# Patient Record
Sex: Female | Born: 1940 | Race: White | Hispanic: No | Smoking: Never smoker
Health system: Southern US, Community
[De-identification: ages and names within clinical notes are randomized; demographics above are authoritative.]

## PROBLEM LIST (undated history)

## (undated) DIAGNOSIS — E876 Hypokalemia: Secondary | ICD-10-CM

## (undated) DIAGNOSIS — I5022 Chronic systolic (congestive) heart failure: Secondary | ICD-10-CM

## (undated) DIAGNOSIS — I447 Left bundle-branch block, unspecified: Secondary | ICD-10-CM

## (undated) DIAGNOSIS — K1230 Oral mucositis (ulcerative), unspecified: Secondary | ICD-10-CM

## (undated) DIAGNOSIS — Z95810 Presence of automatic (implantable) cardiac defibrillator: Secondary | ICD-10-CM

## (undated) DIAGNOSIS — G259 Extrapyramidal and movement disorder, unspecified: Secondary | ICD-10-CM

## (undated) DIAGNOSIS — R5081 Fever presenting with conditions classified elsewhere: Secondary | ICD-10-CM

## (undated) DIAGNOSIS — G4733 Obstructive sleep apnea (adult) (pediatric): Secondary | ICD-10-CM

## (undated) DIAGNOSIS — F0390 Unspecified dementia without behavioral disturbance: Secondary | ICD-10-CM

## (undated) DIAGNOSIS — I10 Essential (primary) hypertension: Secondary | ICD-10-CM

## (undated) DIAGNOSIS — G4730 Sleep apnea, unspecified: Secondary | ICD-10-CM

## (undated) DIAGNOSIS — I722 Aneurysm of renal artery: Secondary | ICD-10-CM

## (undated) DIAGNOSIS — I428 Other cardiomyopathies: Secondary | ICD-10-CM

## (undated) DIAGNOSIS — C189 Malignant neoplasm of colon, unspecified: Secondary | ICD-10-CM

## (undated) DIAGNOSIS — D709 Neutropenia, unspecified: Secondary | ICD-10-CM

## (undated) DIAGNOSIS — Z9889 Other specified postprocedural states: Secondary | ICD-10-CM

## (undated) DIAGNOSIS — K529 Noninfective gastroenteritis and colitis, unspecified: Secondary | ICD-10-CM

## (undated) DIAGNOSIS — D649 Anemia, unspecified: Secondary | ICD-10-CM

## (undated) HISTORY — DX: Aneurysm of renal artery: I72.2

## (undated) HISTORY — DX: Unspecified dementia, unspecified severity, without behavioral disturbance, psychotic disturbance, mood disturbance, and anxiety: F03.90

## (undated) HISTORY — DX: Anemia, unspecified: D64.9

## (undated) HISTORY — DX: Oral mucositis (ulcerative), unspecified: K12.30

## (undated) HISTORY — DX: Hypokalemia: E87.6

## (undated) HISTORY — PX: CHOLECYSTECTOMY: SHX55

## (undated) HISTORY — DX: Left bundle-branch block, unspecified: I44.7

## (undated) HISTORY — DX: Fever presenting with conditions classified elsewhere: R50.81

## (undated) HISTORY — DX: Extrapyramidal and movement disorder, unspecified: G25.9

## (undated) HISTORY — DX: Malignant neoplasm of colon, unspecified: C18.9

## (undated) HISTORY — PX: APPENDECTOMY: SHX54

## (undated) HISTORY — DX: Other cardiomyopathies: I42.8

## (undated) HISTORY — DX: Sleep apnea, unspecified: G47.30

## (undated) HISTORY — PX: OTHER SURGICAL HISTORY: SHX169

## (undated) HISTORY — DX: Neutropenia, unspecified: D70.9

## (undated) HISTORY — DX: Noninfective gastroenteritis and colitis, unspecified: K52.9

## (undated) HISTORY — PX: HEMORROIDECTOMY: SUR656

## (undated) HISTORY — PX: TUBAL LIGATION: SHX77

## (undated) HISTORY — PX: TONSILLECTOMY: SUR1361

## (undated) HISTORY — DX: Other specified postprocedural states: Z98.890

## (undated) HISTORY — PX: CARDIAC CATHETERIZATION: SHX172

## (undated) HISTORY — PX: PORT-A-CATH REMOVAL: SHX5289

---

## 1997-08-26 ENCOUNTER — Other Ambulatory Visit: Admission: RE | Admit: 1997-08-26 | Discharge: 1997-08-26 | Payer: Self-pay | Admitting: Gynecology

## 1998-01-05 ENCOUNTER — Emergency Department (HOSPITAL_COMMUNITY): Admission: EM | Admit: 1998-01-05 | Discharge: 1998-01-05 | Payer: Self-pay | Admitting: Emergency Medicine

## 1998-02-14 ENCOUNTER — Other Ambulatory Visit: Admission: RE | Admit: 1998-02-14 | Discharge: 1998-02-14 | Payer: Self-pay | Admitting: Gynecology

## 1998-06-18 DIAGNOSIS — Z9889 Other specified postprocedural states: Secondary | ICD-10-CM

## 1998-06-18 HISTORY — DX: Other specified postprocedural states: Z98.890

## 1999-02-17 ENCOUNTER — Ambulatory Visit (HOSPITAL_COMMUNITY): Admission: RE | Admit: 1999-02-17 | Discharge: 1999-02-17 | Payer: Self-pay | Admitting: Internal Medicine

## 1999-04-17 ENCOUNTER — Other Ambulatory Visit: Admission: RE | Admit: 1999-04-17 | Discharge: 1999-04-17 | Payer: Self-pay | Admitting: Gynecology

## 1999-04-21 ENCOUNTER — Ambulatory Visit (HOSPITAL_COMMUNITY): Admission: RE | Admit: 1999-04-21 | Discharge: 1999-04-21 | Payer: Self-pay | Admitting: Cardiovascular Disease

## 1999-10-03 ENCOUNTER — Ambulatory Visit (HOSPITAL_COMMUNITY): Admission: RE | Admit: 1999-10-03 | Discharge: 1999-10-03 | Payer: Self-pay | Admitting: Gastroenterology

## 1999-10-20 ENCOUNTER — Encounter (INDEPENDENT_AMBULATORY_CARE_PROVIDER_SITE_OTHER): Payer: Self-pay | Admitting: Specialist

## 1999-10-20 ENCOUNTER — Other Ambulatory Visit: Admission: RE | Admit: 1999-10-20 | Discharge: 1999-10-20 | Payer: Self-pay | Admitting: General Surgery

## 2000-04-17 ENCOUNTER — Other Ambulatory Visit: Admission: RE | Admit: 2000-04-17 | Discharge: 2000-04-17 | Payer: Self-pay | Admitting: Gynecology

## 2000-05-06 ENCOUNTER — Encounter (INDEPENDENT_AMBULATORY_CARE_PROVIDER_SITE_OTHER): Payer: Self-pay | Admitting: Specialist

## 2000-05-06 ENCOUNTER — Other Ambulatory Visit: Admission: RE | Admit: 2000-05-06 | Discharge: 2000-05-06 | Payer: Self-pay | Admitting: Gynecology

## 2000-12-22 ENCOUNTER — Encounter: Payer: Self-pay | Admitting: Internal Medicine

## 2000-12-22 ENCOUNTER — Emergency Department (HOSPITAL_COMMUNITY): Admission: EM | Admit: 2000-12-22 | Discharge: 2000-12-22 | Payer: Self-pay | Admitting: Emergency Medicine

## 2001-01-01 ENCOUNTER — Ambulatory Visit (HOSPITAL_COMMUNITY): Admission: RE | Admit: 2001-01-01 | Discharge: 2001-01-01 | Payer: Self-pay | Admitting: Gastroenterology

## 2001-01-01 ENCOUNTER — Encounter: Payer: Self-pay | Admitting: Gastroenterology

## 2001-02-04 ENCOUNTER — Encounter: Payer: Self-pay | Admitting: General Surgery

## 2001-02-04 ENCOUNTER — Encounter (INDEPENDENT_AMBULATORY_CARE_PROVIDER_SITE_OTHER): Payer: Self-pay

## 2001-02-04 ENCOUNTER — Observation Stay (HOSPITAL_COMMUNITY): Admission: RE | Admit: 2001-02-04 | Discharge: 2001-02-05 | Payer: Self-pay | Admitting: General Surgery

## 2001-04-18 ENCOUNTER — Other Ambulatory Visit: Admission: RE | Admit: 2001-04-18 | Discharge: 2001-04-18 | Payer: Self-pay | Admitting: Gynecology

## 2001-10-17 ENCOUNTER — Encounter: Payer: Self-pay | Admitting: Emergency Medicine

## 2001-10-17 ENCOUNTER — Emergency Department (HOSPITAL_COMMUNITY): Admission: EM | Admit: 2001-10-17 | Discharge: 2001-10-17 | Payer: Self-pay | Admitting: Emergency Medicine

## 2002-05-12 ENCOUNTER — Other Ambulatory Visit: Admission: RE | Admit: 2002-05-12 | Discharge: 2002-05-12 | Payer: Self-pay | Admitting: Gynecology

## 2003-08-23 ENCOUNTER — Other Ambulatory Visit: Admission: RE | Admit: 2003-08-23 | Discharge: 2003-08-23 | Payer: Self-pay | Admitting: Gynecology

## 2004-07-21 ENCOUNTER — Ambulatory Visit (HOSPITAL_BASED_OUTPATIENT_CLINIC_OR_DEPARTMENT_OTHER): Admission: RE | Admit: 2004-07-21 | Discharge: 2004-07-21 | Payer: Self-pay | Admitting: Orthopedic Surgery

## 2004-08-23 ENCOUNTER — Other Ambulatory Visit: Admission: RE | Admit: 2004-08-23 | Discharge: 2004-08-23 | Payer: Self-pay | Admitting: Gynecology

## 2005-08-10 ENCOUNTER — Encounter: Admission: RE | Admit: 2005-08-10 | Discharge: 2005-08-10 | Payer: Self-pay | Admitting: Gastroenterology

## 2005-08-16 DIAGNOSIS — C189 Malignant neoplasm of colon, unspecified: Secondary | ICD-10-CM

## 2005-08-16 HISTORY — DX: Malignant neoplasm of colon, unspecified: C18.9

## 2005-08-28 ENCOUNTER — Other Ambulatory Visit: Admission: RE | Admit: 2005-08-28 | Discharge: 2005-08-28 | Payer: Self-pay | Admitting: Gynecology

## 2005-08-31 ENCOUNTER — Encounter (INDEPENDENT_AMBULATORY_CARE_PROVIDER_SITE_OTHER): Payer: Self-pay | Admitting: *Deleted

## 2005-08-31 ENCOUNTER — Ambulatory Visit (HOSPITAL_COMMUNITY): Admission: RE | Admit: 2005-08-31 | Discharge: 2005-08-31 | Payer: Self-pay | Admitting: General Surgery

## 2005-09-12 ENCOUNTER — Ambulatory Visit: Payer: Self-pay | Admitting: Hematology and Oncology

## 2005-09-19 LAB — CBC WITH DIFFERENTIAL/PLATELET
BASO%: 0.7 % (ref 0.0–2.0)
Basophils Absolute: 0 10*3/uL (ref 0.0–0.1)
EOS%: 3.5 % (ref 0.0–7.0)
Eosinophils Absolute: 0.2 10*3/uL (ref 0.0–0.5)
HCT: 36.3 % (ref 34.8–46.6)
HGB: 12.2 g/dL (ref 11.6–15.9)
LYMPH%: 51 % — ABNORMAL HIGH (ref 14.0–48.0)
MCH: 30.9 pg (ref 26.0–34.0)
MCHC: 33.6 g/dL (ref 32.0–36.0)
MCV: 91.8 fL (ref 81.0–101.0)
MONO#: 0.6 10*3/uL (ref 0.1–0.9)
MONO%: 9.8 % (ref 0.0–13.0)
NEUT#: 2.1 10*3/uL (ref 1.5–6.5)
NEUT%: 35 % — ABNORMAL LOW (ref 39.6–76.8)
Platelets: 320 10*3/uL (ref 145–400)
RBC: 3.96 10*6/uL (ref 3.70–5.32)
RDW: 12.9 % (ref 11.3–14.5)
WBC: 5.9 10*3/uL (ref 3.9–10.0)
lymph#: 3 10*3/uL (ref 0.9–3.3)

## 2005-09-19 LAB — COMPREHENSIVE METABOLIC PANEL
ALT: 21 U/L (ref 0–40)
AST: 25 U/L (ref 0–37)
Albumin: 4.4 g/dL (ref 3.5–5.2)
Alkaline Phosphatase: 85 U/L (ref 39–117)
BUN: 18 mg/dL (ref 6–23)
CO2: 28 mEq/L (ref 19–32)
Calcium: 9.3 mg/dL (ref 8.4–10.5)
Chloride: 102 mEq/L (ref 96–112)
Creatinine, Ser: 0.8 mg/dL (ref 0.4–1.2)
Glucose, Bld: 127 mg/dL — ABNORMAL HIGH (ref 70–99)
Potassium: 3.8 mEq/L (ref 3.5–5.3)
Sodium: 143 mEq/L (ref 135–145)
Total Bilirubin: 0.3 mg/dL (ref 0.3–1.2)
Total Protein: 7 g/dL (ref 6.0–8.3)

## 2005-09-19 LAB — CEA: CEA: 1 ng/mL (ref 0.0–5.0)

## 2005-09-20 ENCOUNTER — Ambulatory Visit: Admission: RE | Admit: 2005-09-20 | Discharge: 2005-12-12 | Payer: Self-pay | Admitting: Radiation Oncology

## 2005-09-26 ENCOUNTER — Ambulatory Visit (HOSPITAL_COMMUNITY): Admission: RE | Admit: 2005-09-26 | Discharge: 2005-09-26 | Payer: Self-pay | Admitting: Hematology and Oncology

## 2005-09-28 ENCOUNTER — Ambulatory Visit (HOSPITAL_BASED_OUTPATIENT_CLINIC_OR_DEPARTMENT_OTHER): Admission: RE | Admit: 2005-09-28 | Discharge: 2005-09-28 | Payer: Self-pay | Admitting: General Surgery

## 2005-10-09 LAB — BASIC METABOLIC PANEL
BUN: 14 mg/dL (ref 6–23)
CO2: 30 mEq/L (ref 19–32)
Calcium: 9.5 mg/dL (ref 8.4–10.5)
Chloride: 102 mEq/L (ref 96–112)
Creatinine, Ser: 0.8 mg/dL (ref 0.4–1.2)
Glucose, Bld: 115 mg/dL — ABNORMAL HIGH (ref 70–99)
Potassium: 3.8 mEq/L (ref 3.5–5.3)
Sodium: 140 mEq/L (ref 135–145)

## 2005-10-09 LAB — CBC WITH DIFFERENTIAL/PLATELET
BASO%: 0.2 % (ref 0.0–2.0)
Basophils Absolute: 0 10*3/uL (ref 0.0–0.1)
EOS%: 0.3 % (ref 0.0–7.0)
Eosinophils Absolute: 0 10*3/uL (ref 0.0–0.5)
HCT: 37 % (ref 34.8–46.6)
HGB: 12.4 g/dL (ref 11.6–15.9)
LYMPH%: 16.5 % (ref 14.0–48.0)
MCH: 31.1 pg (ref 26.0–34.0)
MCHC: 33.5 g/dL (ref 32.0–36.0)
MCV: 93 fL (ref 81.0–101.0)
MONO#: 0.9 10*3/uL (ref 0.1–0.9)
MONO%: 7.7 % (ref 0.0–13.0)
NEUT#: 9.2 10*3/uL — ABNORMAL HIGH (ref 1.5–6.5)
NEUT%: 75.3 % (ref 39.6–76.8)
Platelets: 319 10*3/uL (ref 145–400)
RBC: 3.98 10*6/uL (ref 3.70–5.32)
RDW: 12.9 % (ref 11.3–14.5)
WBC: 12.2 10*3/uL — ABNORMAL HIGH (ref 3.9–10.0)
lymph#: 2 10*3/uL (ref 0.9–3.3)

## 2005-10-16 LAB — CBC WITH DIFFERENTIAL/PLATELET
BASO%: 0.3 % (ref 0.0–2.0)
Basophils Absolute: 0 10*3/uL (ref 0.0–0.1)
EOS%: 3.6 % (ref 0.0–7.0)
Eosinophils Absolute: 0.1 10*3/uL (ref 0.0–0.5)
HCT: 32.7 % — ABNORMAL LOW (ref 34.8–46.6)
HGB: 11.3 g/dL — ABNORMAL LOW (ref 11.6–15.9)
LYMPH%: 33.7 % (ref 14.0–48.0)
MCH: 31.3 pg (ref 26.0–34.0)
MCHC: 34.5 g/dL (ref 32.0–36.0)
MCV: 90.7 fL (ref 81.0–101.0)
MONO#: 0.1 10*3/uL (ref 0.1–0.9)
MONO%: 2.5 % (ref 0.0–13.0)
NEUT#: 1.6 10*3/uL (ref 1.5–6.5)
NEUT%: 59.9 % (ref 39.6–76.8)
Platelets: 190 10*3/uL (ref 145–400)
RBC: 3.6 10*6/uL — ABNORMAL LOW (ref 3.70–5.32)
RDW: 12.6 % (ref 11.3–14.5)
WBC: 2.7 10*3/uL — ABNORMAL LOW (ref 3.9–10.0)
lymph#: 0.9 10*3/uL (ref 0.9–3.3)

## 2005-10-22 LAB — CBC WITH DIFFERENTIAL/PLATELET
BASO%: 0.4 % (ref 0.0–2.0)
Basophils Absolute: 0 10*3/uL (ref 0.0–0.1)
EOS%: 12 % — ABNORMAL HIGH (ref 0.0–7.0)
Eosinophils Absolute: 0.2 10*3/uL (ref 0.0–0.5)
HCT: 29.9 % — ABNORMAL LOW (ref 34.8–46.6)
HGB: 10.3 g/dL — ABNORMAL LOW (ref 11.6–15.9)
LYMPH%: 57.5 % — ABNORMAL HIGH (ref 14.0–48.0)
MCH: 31.2 pg (ref 26.0–34.0)
MCHC: 34.5 g/dL (ref 32.0–36.0)
MCV: 90.3 fL (ref 81.0–101.0)
MONO#: 0.2 10*3/uL (ref 0.1–0.9)
MONO%: 13.9 % — ABNORMAL HIGH (ref 0.0–13.0)
NEUT#: 0.2 10*3/uL — CL (ref 1.5–6.5)
NEUT%: 16.2 % — ABNORMAL LOW (ref 39.6–76.8)
Platelets: 44 10*3/uL — ABNORMAL LOW (ref 145–400)
RBC: 3.31 10*6/uL — ABNORMAL LOW (ref 3.70–5.32)
RDW: 11.9 % (ref 11.3–14.5)
WBC: 1.4 10*3/uL — ABNORMAL LOW (ref 3.9–10.0)
lymph#: 0.8 10*3/uL — ABNORMAL LOW (ref 0.9–3.3)

## 2005-10-26 LAB — CBC WITH DIFFERENTIAL/PLATELET
BASO%: 2.2 % — ABNORMAL HIGH (ref 0.0–2.0)
Basophils Absolute: 0.1 10*3/uL (ref 0.0–0.1)
EOS%: 2.1 % (ref 0.0–7.0)
Eosinophils Absolute: 0 10*3/uL (ref 0.0–0.5)
HCT: 29.3 % — ABNORMAL LOW (ref 34.8–46.6)
HGB: 10.4 g/dL — ABNORMAL LOW (ref 11.6–15.9)
LYMPH%: 30.4 % (ref 14.0–48.0)
MCH: 31.4 pg (ref 26.0–34.0)
MCHC: 35.5 g/dL (ref 32.0–36.0)
MCV: 88.5 fL (ref 81.0–101.0)
MONO#: 0.8 10*3/uL (ref 0.1–0.9)
MONO%: 33.7 % — ABNORMAL HIGH (ref 0.0–13.0)
NEUT#: 0.7 10*3/uL — ABNORMAL LOW (ref 1.5–6.5)
NEUT%: 31.5 % — ABNORMAL LOW (ref 39.6–76.8)
Platelets: 66 10*3/uL — ABNORMAL LOW (ref 145–400)
RBC: 3.31 10*6/uL — ABNORMAL LOW (ref 3.70–5.32)
RDW: 10.8 % — ABNORMAL LOW (ref 11.3–14.5)
WBC: 2.3 10*3/uL — ABNORMAL LOW (ref 3.9–10.0)
lymph#: 0.7 10*3/uL — ABNORMAL LOW (ref 0.9–3.3)

## 2005-10-30 LAB — CBC WITH DIFFERENTIAL/PLATELET
BASO%: 0.4 % (ref 0.0–2.0)
Basophils Absolute: 0 10*3/uL (ref 0.0–0.1)
EOS%: 4.7 % (ref 0.0–7.0)
Eosinophils Absolute: 0.2 10*3/uL (ref 0.0–0.5)
HCT: 30.6 % — ABNORMAL LOW (ref 34.8–46.6)
HGB: 10.5 g/dL — ABNORMAL LOW (ref 11.6–15.9)
LYMPH%: 26.1 % (ref 14.0–48.0)
MCH: 31.5 pg (ref 26.0–34.0)
MCHC: 34.3 g/dL (ref 32.0–36.0)
MCV: 91.8 fL (ref 81.0–101.0)
MONO#: 0.8 10*3/uL (ref 0.1–0.9)
MONO%: 21 % — ABNORMAL HIGH (ref 0.0–13.0)
NEUT#: 1.8 10*3/uL (ref 1.5–6.5)
NEUT%: 47.8 % (ref 39.6–76.8)
Platelets: 220 10*3/uL (ref 145–400)
RBC: 3.34 10*6/uL — ABNORMAL LOW (ref 3.70–5.32)
RDW: 12.8 % (ref 11.3–14.5)
WBC: 3.7 10*3/uL — ABNORMAL LOW (ref 3.9–10.0)
lymph#: 1 10*3/uL (ref 0.9–3.3)

## 2005-10-31 ENCOUNTER — Ambulatory Visit: Payer: Self-pay | Admitting: Hematology and Oncology

## 2005-11-02 LAB — COMPREHENSIVE METABOLIC PANEL
ALT: 24 U/L (ref 0–40)
AST: 28 U/L (ref 0–37)
Albumin: 3.8 g/dL (ref 3.5–5.2)
Alkaline Phosphatase: 65 U/L (ref 39–117)
BUN: 15 mg/dL (ref 6–23)
CO2: 28 mEq/L (ref 19–32)
Calcium: 8.5 mg/dL (ref 8.4–10.5)
Chloride: 104 mEq/L (ref 96–112)
Creatinine, Ser: 0.7 mg/dL (ref 0.4–1.2)
Glucose, Bld: 128 mg/dL — ABNORMAL HIGH (ref 70–99)
Potassium: 4.5 mEq/L (ref 3.5–5.3)
Sodium: 141 mEq/L (ref 135–145)
Total Bilirubin: 0.3 mg/dL (ref 0.3–1.2)
Total Protein: 6.2 g/dL (ref 6.0–8.3)

## 2005-11-02 LAB — CBC WITH DIFFERENTIAL/PLATELET
BASO%: 0.2 % (ref 0.0–2.0)
Basophils Absolute: 0 10*3/uL (ref 0.0–0.1)
EOS%: 9 % — ABNORMAL HIGH (ref 0.0–7.0)
Eosinophils Absolute: 0.3 10*3/uL (ref 0.0–0.5)
HCT: 29.4 % — ABNORMAL LOW (ref 34.8–46.6)
HGB: 10.1 g/dL — ABNORMAL LOW (ref 11.6–15.9)
LYMPH%: 17.2 % (ref 14.0–48.0)
MCH: 31.5 pg (ref 26.0–34.0)
MCHC: 34.5 g/dL (ref 32.0–36.0)
MCV: 91.5 fL (ref 81.0–101.0)
MONO#: 0.6 10*3/uL (ref 0.1–0.9)
MONO%: 16.2 % — ABNORMAL HIGH (ref 0.0–13.0)
NEUT#: 2.2 10*3/uL (ref 1.5–6.5)
NEUT%: 57.4 % (ref 39.6–76.8)
Platelets: 293 10*3/uL (ref 145–400)
RBC: 3.21 10*6/uL — ABNORMAL LOW (ref 3.70–5.32)
RDW: 12.9 % (ref 11.3–14.5)
WBC: 3.8 10*3/uL — ABNORMAL LOW (ref 3.9–10.0)
lymph#: 0.7 10*3/uL — ABNORMAL LOW (ref 0.9–3.3)

## 2005-11-06 LAB — CBC WITH DIFFERENTIAL/PLATELET
BASO%: 0.4 % (ref 0.0–2.0)
Basophils Absolute: 0 10*3/uL (ref 0.0–0.1)
EOS%: 11.9 % — ABNORMAL HIGH (ref 0.0–7.0)
Eosinophils Absolute: 0.7 10*3/uL — ABNORMAL HIGH (ref 0.0–0.5)
HCT: 31.5 % — ABNORMAL LOW (ref 34.8–46.6)
HGB: 10.7 g/dL — ABNORMAL LOW (ref 11.6–15.9)
LYMPH%: 16.9 % (ref 14.0–48.0)
MCH: 31.5 pg (ref 26.0–34.0)
MCHC: 33.9 g/dL (ref 32.0–36.0)
MCV: 92.9 fL (ref 81.0–101.0)
MONO#: 0.9 10*3/uL (ref 0.1–0.9)
MONO%: 15.4 % — ABNORMAL HIGH (ref 0.0–13.0)
NEUT#: 3.1 10*3/uL (ref 1.5–6.5)
NEUT%: 55.4 % (ref 39.6–76.8)
Platelets: 353 10*3/uL (ref 145–400)
RBC: 3.4 10*6/uL — ABNORMAL LOW (ref 3.70–5.32)
RDW: 14.1 % (ref 11.3–14.5)
WBC: 5.7 10*3/uL (ref 3.9–10.0)
lymph#: 1 10*3/uL (ref 0.9–3.3)

## 2005-11-13 LAB — CBC WITH DIFFERENTIAL/PLATELET
BASO%: 0.4 % (ref 0.0–2.0)
Basophils Absolute: 0 10*3/uL (ref 0.0–0.1)
EOS%: 9.2 % — ABNORMAL HIGH (ref 0.0–7.0)
Eosinophils Absolute: 0.4 10*3/uL (ref 0.0–0.5)
HCT: 30.8 % — ABNORMAL LOW (ref 34.8–46.6)
HGB: 10.6 g/dL — ABNORMAL LOW (ref 11.6–15.9)
LYMPH%: 13.4 % — ABNORMAL LOW (ref 14.0–48.0)
MCH: 31.8 pg (ref 26.0–34.0)
MCHC: 34.5 g/dL (ref 32.0–36.0)
MCV: 92.3 fL (ref 81.0–101.0)
MONO#: 0.8 10*3/uL (ref 0.1–0.9)
MONO%: 18.1 % — ABNORMAL HIGH (ref 0.0–13.0)
NEUT#: 2.7 10*3/uL (ref 1.5–6.5)
NEUT%: 58.9 % (ref 39.6–76.8)
Platelets: 201 10*3/uL (ref 145–400)
RBC: 3.34 10*6/uL — ABNORMAL LOW (ref 3.70–5.32)
RDW: 14.7 % — ABNORMAL HIGH (ref 11.3–14.5)
WBC: 4.6 10*3/uL (ref 3.9–10.0)
lymph#: 0.6 10*3/uL — ABNORMAL LOW (ref 0.9–3.3)

## 2005-11-21 LAB — CBC WITH DIFFERENTIAL/PLATELET
BASO%: 0.2 % (ref 0.0–2.0)
Basophils Absolute: 0 10*3/uL (ref 0.0–0.1)
EOS%: 6.2 % (ref 0.0–7.0)
Eosinophils Absolute: 0.2 10*3/uL (ref 0.0–0.5)
HCT: 29.2 % — ABNORMAL LOW (ref 34.8–46.6)
HGB: 9.8 g/dL — ABNORMAL LOW (ref 11.6–15.9)
LYMPH%: 6.3 % — ABNORMAL LOW (ref 14.0–48.0)
MCH: 31.1 pg (ref 26.0–34.0)
MCHC: 33.5 g/dL (ref 32.0–36.0)
MCV: 92.9 fL (ref 81.0–101.0)
MONO#: 0.1 10*3/uL (ref 0.1–0.9)
MONO%: 2 % (ref 0.0–13.0)
NEUT#: 2.7 10*3/uL (ref 1.5–6.5)
NEUT%: 85.3 % — ABNORMAL HIGH (ref 39.6–76.8)
Platelets: 136 10*3/uL — ABNORMAL LOW (ref 145–400)
RBC: 3.14 10*6/uL — ABNORMAL LOW (ref 3.70–5.32)
RDW: 15.2 % — ABNORMAL HIGH (ref 11.3–14.5)
WBC: 3.2 10*3/uL — ABNORMAL LOW (ref 3.9–10.0)
lymph#: 0.2 10*3/uL — ABNORMAL LOW (ref 0.9–3.3)

## 2005-11-21 LAB — COMPREHENSIVE METABOLIC PANEL
ALT: 29 U/L (ref 0–40)
AST: 21 U/L (ref 0–37)
Albumin: 3.7 g/dL (ref 3.5–5.2)
Alkaline Phosphatase: 77 U/L (ref 39–117)
BUN: 17 mg/dL (ref 6–23)
CO2: 24 mEq/L (ref 19–32)
Calcium: 8.5 mg/dL (ref 8.4–10.5)
Chloride: 106 mEq/L (ref 96–112)
Creatinine, Ser: 0.75 mg/dL (ref 0.40–1.20)
Glucose, Bld: 131 mg/dL — ABNORMAL HIGH (ref 70–99)
Potassium: 4.3 mEq/L (ref 3.5–5.3)
Sodium: 141 mEq/L (ref 135–145)
Total Bilirubin: 0.5 mg/dL (ref 0.3–1.2)
Total Protein: 6.4 g/dL (ref 6.0–8.3)

## 2005-11-22 ENCOUNTER — Ambulatory Visit: Payer: Self-pay | Admitting: Hematology and Oncology

## 2005-11-22 ENCOUNTER — Inpatient Hospital Stay (HOSPITAL_COMMUNITY): Admission: EM | Admit: 2005-11-22 | Discharge: 2005-11-23 | Payer: Self-pay | Admitting: Hematology and Oncology

## 2005-11-22 LAB — CBC WITH DIFFERENTIAL/PLATELET
BASO%: 0.1 % (ref 0.0–2.0)
Basophils Absolute: 0 10*3/uL (ref 0.0–0.1)
EOS%: 5.6 % (ref 0.0–7.0)
Eosinophils Absolute: 0.1 10*3/uL (ref 0.0–0.5)
HCT: 24.1 % — ABNORMAL LOW (ref 34.8–46.6)
HGB: 8.4 g/dL — ABNORMAL LOW (ref 11.6–15.9)
LYMPH%: 10.2 % — ABNORMAL LOW (ref 14.0–48.0)
MCH: 32.2 pg (ref 26.0–34.0)
MCHC: 34.7 g/dL (ref 32.0–36.0)
MCV: 92.6 fL (ref 81.0–101.0)
MONO#: 0.1 10*3/uL (ref 0.1–0.9)
MONO%: 5.3 % (ref 0.0–13.0)
NEUT#: 1.1 10*3/uL — ABNORMAL LOW (ref 1.5–6.5)
NEUT%: 78.8 % — ABNORMAL HIGH (ref 39.6–76.8)
Platelets: 99 10*3/uL — ABNORMAL LOW (ref 145–400)
RBC: 2.6 10*6/uL — ABNORMAL LOW (ref 3.70–5.32)
RDW: 14.7 % — ABNORMAL HIGH (ref 11.3–14.5)
WBC: 1.4 10*3/uL — ABNORMAL LOW (ref 3.9–10.0)
lymph#: 0.1 10*3/uL — ABNORMAL LOW (ref 0.9–3.3)

## 2005-11-22 LAB — BASIC METABOLIC PANEL
BUN: 7 mg/dL (ref 6–23)
CO2: 25 mEq/L (ref 19–32)
Calcium: 8.4 mg/dL (ref 8.4–10.5)
Chloride: 107 mEq/L (ref 96–112)
Creatinine, Ser: 0.6 mg/dL (ref 0.40–1.20)
Glucose, Bld: 116 mg/dL — ABNORMAL HIGH (ref 70–99)
Potassium: 3.1 mEq/L — ABNORMAL LOW (ref 3.5–5.3)
Sodium: 137 mEq/L (ref 135–145)

## 2005-11-25 LAB — CULTURE, BLOOD (SINGLE)

## 2005-11-28 LAB — CBC WITH DIFFERENTIAL/PLATELET
BASO%: 0.1 % (ref 0.0–2.0)
Basophils Absolute: 0 10*3/uL (ref 0.0–0.1)
EOS%: 1.3 % (ref 0.0–7.0)
Eosinophils Absolute: 0 10*3/uL (ref 0.0–0.5)
HCT: 33.9 % — ABNORMAL LOW (ref 34.8–46.6)
HGB: 11.5 g/dL — ABNORMAL LOW (ref 11.6–15.9)
LYMPH%: 20.6 % (ref 14.0–48.0)
MCH: 31.8 pg (ref 26.0–34.0)
MCHC: 34 g/dL (ref 32.0–36.0)
MCV: 93.4 fL (ref 81.0–101.0)
MONO#: 0.5 10*3/uL (ref 0.1–0.9)
MONO%: 15.3 % — ABNORMAL HIGH (ref 0.0–13.0)
NEUT#: 2.1 10*3/uL (ref 1.5–6.5)
NEUT%: 62.7 % (ref 39.6–76.8)
Platelets: 45 10*3/uL — ABNORMAL LOW (ref 145–400)
RBC: 3.63 10*6/uL — ABNORMAL LOW (ref 3.70–5.32)
RDW: 14.6 % — ABNORMAL HIGH (ref 11.3–14.5)
WBC: 3.4 10*3/uL — ABNORMAL LOW (ref 3.9–10.0)
lymph#: 0.7 10*3/uL — ABNORMAL LOW (ref 0.9–3.3)

## 2005-11-28 LAB — CULTURE, BLOOD (SINGLE)

## 2005-12-05 LAB — CBC WITH DIFFERENTIAL/PLATELET
BASO%: 0.2 % (ref 0.0–2.0)
Basophils Absolute: 0 10*3/uL (ref 0.0–0.1)
EOS%: 0.2 % (ref 0.0–7.0)
Eosinophils Absolute: 0 10*3/uL (ref 0.0–0.5)
HCT: 29.8 % — ABNORMAL LOW (ref 34.8–46.6)
HGB: 10.1 g/dL — ABNORMAL LOW (ref 11.6–15.9)
LYMPH%: 44.5 % (ref 14.0–48.0)
MCH: 32.1 pg (ref 26.0–34.0)
MCHC: 33.8 g/dL (ref 32.0–36.0)
MCV: 95.2 fL (ref 81.0–101.0)
MONO#: 0.5 10*3/uL (ref 0.1–0.9)
MONO%: 15 % — ABNORMAL HIGH (ref 0.0–13.0)
NEUT#: 1.3 10*3/uL — ABNORMAL LOW (ref 1.5–6.5)
NEUT%: 40.1 % (ref 39.6–76.8)
Platelets: 278 10*3/uL (ref 145–400)
RBC: 3.13 10*6/uL — ABNORMAL LOW (ref 3.70–5.32)
RDW: 15.4 % — ABNORMAL HIGH (ref 11.3–14.5)
WBC: 3.2 10*3/uL — ABNORMAL LOW (ref 3.9–10.0)
lymph#: 1.4 10*3/uL (ref 0.9–3.3)

## 2005-12-05 LAB — BASIC METABOLIC PANEL
BUN: 14 mg/dL (ref 6–23)
CO2: 30 mEq/L (ref 19–32)
Calcium: 9.1 mg/dL (ref 8.4–10.5)
Chloride: 106 mEq/L (ref 96–112)
Creatinine, Ser: 0.89 mg/dL (ref 0.40–1.20)
Glucose, Bld: 144 mg/dL — ABNORMAL HIGH (ref 70–99)
Potassium: 4.9 mEq/L (ref 3.5–5.3)
Sodium: 146 mEq/L — ABNORMAL HIGH (ref 135–145)

## 2005-12-10 ENCOUNTER — Ambulatory Visit: Payer: Self-pay | Admitting: Hematology and Oncology

## 2005-12-12 LAB — CBC WITH DIFFERENTIAL/PLATELET
BASO%: 0.5 % (ref 0.0–2.0)
Basophils Absolute: 0 10*3/uL (ref 0.0–0.1)
EOS%: 0.7 % (ref 0.0–7.0)
Eosinophils Absolute: 0 10*3/uL (ref 0.0–0.5)
HCT: 30.1 % — ABNORMAL LOW (ref 34.8–46.6)
HGB: 10.1 g/dL — ABNORMAL LOW (ref 11.6–15.9)
LYMPH%: 38.6 % (ref 14.0–48.0)
MCH: 32.6 pg (ref 26.0–34.0)
MCHC: 33.4 g/dL (ref 32.0–36.0)
MCV: 97.5 fL (ref 81.0–101.0)
MONO#: 0.7 10*3/uL (ref 0.1–0.9)
MONO%: 16.7 % — ABNORMAL HIGH (ref 0.0–13.0)
NEUT#: 1.9 10*3/uL (ref 1.5–6.5)
NEUT%: 43.5 % (ref 39.6–76.8)
Platelets: 350 10*3/uL (ref 145–400)
RBC: 3.09 10*6/uL — ABNORMAL LOW (ref 3.70–5.32)
RDW: 18.3 % — ABNORMAL HIGH (ref 11.3–14.5)
WBC: 4.5 10*3/uL (ref 3.9–10.0)
lymph#: 1.7 10*3/uL (ref 0.9–3.3)

## 2005-12-21 LAB — CBC WITH DIFFERENTIAL/PLATELET
BASO%: 0.7 % (ref 0.0–2.0)
Basophils Absolute: 0 10*3/uL (ref 0.0–0.1)
EOS%: 2.7 % (ref 0.0–7.0)
Eosinophils Absolute: 0.1 10*3/uL (ref 0.0–0.5)
HCT: 33.9 % — ABNORMAL LOW (ref 34.8–46.6)
HGB: 11.3 g/dL — ABNORMAL LOW (ref 11.6–15.9)
LYMPH%: 28.9 % (ref 14.0–48.0)
MCH: 33.1 pg (ref 26.0–34.0)
MCHC: 33.3 g/dL (ref 32.0–36.0)
MCV: 99.3 fL (ref 81.0–101.0)
MONO#: 0.7 10*3/uL (ref 0.1–0.9)
MONO%: 12.7 % (ref 0.0–13.0)
NEUT#: 2.8 10*3/uL (ref 1.5–6.5)
NEUT%: 55 % (ref 39.6–76.8)
Platelets: 231 10*3/uL (ref 145–400)
RBC: 3.41 10*6/uL — ABNORMAL LOW (ref 3.70–5.32)
RDW: 18.6 % — ABNORMAL HIGH (ref 11.3–14.5)
WBC: 5.2 10*3/uL (ref 3.9–10.0)
lymph#: 1.5 10*3/uL (ref 0.9–3.3)

## 2005-12-21 LAB — COMPREHENSIVE METABOLIC PANEL
ALT: 12 U/L (ref 0–40)
AST: 18 U/L (ref 0–37)
Albumin: 3.9 g/dL (ref 3.5–5.2)
Alkaline Phosphatase: 63 U/L (ref 39–117)
BUN: 16 mg/dL (ref 6–23)
CO2: 23 mEq/L (ref 19–32)
Calcium: 9 mg/dL (ref 8.4–10.5)
Chloride: 108 mEq/L (ref 96–112)
Creatinine, Ser: 0.73 mg/dL (ref 0.40–1.20)
Glucose, Bld: 120 mg/dL — ABNORMAL HIGH (ref 70–99)
Potassium: 4.4 mEq/L (ref 3.5–5.3)
Sodium: 144 mEq/L (ref 135–145)
Total Bilirubin: 0.4 mg/dL (ref 0.3–1.2)
Total Protein: 6.9 g/dL (ref 6.0–8.3)

## 2006-01-25 ENCOUNTER — Ambulatory Visit: Payer: Self-pay | Admitting: Hematology and Oncology

## 2006-01-29 LAB — COMPREHENSIVE METABOLIC PANEL
ALT: 17 U/L (ref 0–40)
AST: 22 U/L (ref 0–37)
Albumin: 4 g/dL (ref 3.5–5.2)
Alkaline Phosphatase: 59 U/L (ref 39–117)
BUN: 15 mg/dL (ref 6–23)
CO2: 28 mEq/L (ref 19–32)
Calcium: 9 mg/dL (ref 8.4–10.5)
Chloride: 104 mEq/L (ref 96–112)
Creatinine, Ser: 0.8 mg/dL (ref 0.40–1.20)
Glucose, Bld: 97 mg/dL (ref 70–99)
Potassium: 5.1 mEq/L (ref 3.5–5.3)
Sodium: 148 mEq/L — ABNORMAL HIGH (ref 135–145)
Total Bilirubin: 0.4 mg/dL (ref 0.3–1.2)
Total Protein: 6.4 g/dL (ref 6.0–8.3)

## 2006-01-29 LAB — CBC WITH DIFFERENTIAL/PLATELET
BASO%: 0.5 % (ref 0.0–2.0)
Basophils Absolute: 0 10*3/uL (ref 0.0–0.1)
EOS%: 6 % (ref 0.0–7.0)
Eosinophils Absolute: 0.3 10*3/uL (ref 0.0–0.5)
HCT: 33.3 % — ABNORMAL LOW (ref 34.8–46.6)
HGB: 11.3 g/dL — ABNORMAL LOW (ref 11.6–15.9)
LYMPH%: 28.3 % (ref 14.0–48.0)
MCH: 33.6 pg (ref 26.0–34.0)
MCHC: 34.1 g/dL (ref 32.0–36.0)
MCV: 98.6 fL (ref 81.0–101.0)
MONO#: 0.5 10*3/uL (ref 0.1–0.9)
MONO%: 11.6 % (ref 0.0–13.0)
NEUT#: 2.4 10*3/uL (ref 1.5–6.5)
NEUT%: 53.6 % (ref 39.6–76.8)
Platelets: 218 10*3/uL (ref 145–400)
RBC: 3.37 10*6/uL — ABNORMAL LOW (ref 3.70–5.32)
RDW: 14.1 % (ref 11.3–14.5)
WBC: 4.5 10*3/uL (ref 3.9–10.0)
lymph#: 1.3 10*3/uL (ref 0.9–3.3)

## 2006-02-07 ENCOUNTER — Ambulatory Visit (HOSPITAL_BASED_OUTPATIENT_CLINIC_OR_DEPARTMENT_OTHER): Admission: RE | Admit: 2006-02-07 | Discharge: 2006-02-07 | Payer: Self-pay | Admitting: General Surgery

## 2006-02-07 ENCOUNTER — Encounter (INDEPENDENT_AMBULATORY_CARE_PROVIDER_SITE_OTHER): Payer: Self-pay | Admitting: Specialist

## 2006-03-12 ENCOUNTER — Ambulatory Visit: Payer: Self-pay | Admitting: Hematology and Oncology

## 2006-04-23 ENCOUNTER — Ambulatory Visit: Payer: Self-pay | Admitting: Hematology and Oncology

## 2006-05-08 LAB — COMPREHENSIVE METABOLIC PANEL
ALT: 16 U/L (ref 0–35)
AST: 18 U/L (ref 0–37)
Albumin: 4.2 g/dL (ref 3.5–5.2)
Alkaline Phosphatase: 72 U/L (ref 39–117)
BUN: 17 mg/dL (ref 6–23)
CO2: 31 mEq/L (ref 19–32)
Calcium: 8.9 mg/dL (ref 8.4–10.5)
Chloride: 106 mEq/L (ref 96–112)
Creatinine, Ser: 0.79 mg/dL (ref 0.40–1.20)
Glucose, Bld: 110 mg/dL — ABNORMAL HIGH (ref 70–99)
Potassium: 3.7 mEq/L (ref 3.5–5.3)
Sodium: 143 mEq/L (ref 135–145)
Total Bilirubin: 0.4 mg/dL (ref 0.3–1.2)
Total Protein: 6.7 g/dL (ref 6.0–8.3)

## 2006-05-08 LAB — CBC WITH DIFFERENTIAL/PLATELET
BASO%: 0.6 % (ref 0.0–2.0)
Basophils Absolute: 0 10*3/uL (ref 0.0–0.1)
EOS%: 4.2 % (ref 0.0–7.0)
Eosinophils Absolute: 0.2 10*3/uL (ref 0.0–0.5)
HCT: 33.3 % — ABNORMAL LOW (ref 34.8–46.6)
HGB: 11.4 g/dL — ABNORMAL LOW (ref 11.6–15.9)
LYMPH%: 30.4 % (ref 14.0–48.0)
MCH: 32.9 pg (ref 26.0–34.0)
MCHC: 34.4 g/dL (ref 32.0–36.0)
MCV: 95.5 fL (ref 81.0–101.0)
MONO#: 0.6 10*3/uL (ref 0.1–0.9)
MONO%: 15.9 % — ABNORMAL HIGH (ref 0.0–13.0)
NEUT#: 1.8 10*3/uL (ref 1.5–6.5)
NEUT%: 48.9 % (ref 39.6–76.8)
Platelets: 235 10*3/uL (ref 145–400)
RBC: 3.48 10*6/uL — ABNORMAL LOW (ref 3.70–5.32)
RDW: 14 % (ref 11.3–14.5)
WBC: 3.6 10*3/uL — ABNORMAL LOW (ref 3.9–10.0)
lymph#: 1.1 10*3/uL (ref 0.9–3.3)

## 2006-05-13 ENCOUNTER — Ambulatory Visit (HOSPITAL_COMMUNITY): Admission: RE | Admit: 2006-05-13 | Discharge: 2006-05-13 | Payer: Self-pay | Admitting: Hematology and Oncology

## 2006-06-28 ENCOUNTER — Ambulatory Visit (HOSPITAL_BASED_OUTPATIENT_CLINIC_OR_DEPARTMENT_OTHER): Admission: RE | Admit: 2006-06-28 | Discharge: 2006-06-28 | Payer: Self-pay | Admitting: General Surgery

## 2006-08-30 ENCOUNTER — Other Ambulatory Visit: Admission: RE | Admit: 2006-08-30 | Discharge: 2006-08-30 | Payer: Self-pay | Admitting: Gynecology

## 2006-09-04 ENCOUNTER — Ambulatory Visit (HOSPITAL_COMMUNITY): Admission: RE | Admit: 2006-09-04 | Discharge: 2006-09-04 | Payer: Self-pay | Admitting: Hematology and Oncology

## 2006-09-05 ENCOUNTER — Ambulatory Visit: Payer: Self-pay | Admitting: Hematology and Oncology

## 2006-09-10 LAB — COMPREHENSIVE METABOLIC PANEL
ALT: 12 U/L (ref 0–35)
AST: 16 U/L (ref 0–37)
Albumin: 4.3 g/dL (ref 3.5–5.2)
Alkaline Phosphatase: 78 U/L (ref 39–117)
BUN: 12 mg/dL (ref 6–23)
CO2: 30 mEq/L (ref 19–32)
Calcium: 8.9 mg/dL (ref 8.4–10.5)
Chloride: 105 mEq/L (ref 96–112)
Creatinine, Ser: 0.62 mg/dL (ref 0.40–1.20)
Glucose, Bld: 99 mg/dL (ref 70–99)
Potassium: 4.3 mEq/L (ref 3.5–5.3)
Sodium: 143 mEq/L (ref 135–145)
Total Bilirubin: 0.4 mg/dL (ref 0.3–1.2)
Total Protein: 6.7 g/dL (ref 6.0–8.3)

## 2006-09-10 LAB — CBC WITH DIFFERENTIAL/PLATELET
BASO%: 0.4 % (ref 0.0–2.0)
Basophils Absolute: 0 10*3/uL (ref 0.0–0.1)
EOS%: 3.2 % (ref 0.0–7.0)
Eosinophils Absolute: 0.1 10*3/uL (ref 0.0–0.5)
HCT: 34.5 % — ABNORMAL LOW (ref 34.8–46.6)
HGB: 12.1 g/dL (ref 11.6–15.9)
LYMPH%: 29.5 % (ref 14.0–48.0)
MCH: 32.7 pg (ref 26.0–34.0)
MCHC: 34.9 g/dL (ref 32.0–36.0)
MCV: 93.7 fL (ref 81.0–101.0)
MONO#: 0.5 10*3/uL (ref 0.1–0.9)
MONO%: 11.9 % (ref 0.0–13.0)
NEUT#: 2.4 10*3/uL (ref 1.5–6.5)
NEUT%: 55 % (ref 39.6–76.8)
Platelets: 227 10*3/uL (ref 145–400)
RBC: 3.69 10*6/uL — ABNORMAL LOW (ref 3.70–5.32)
RDW: 12.7 % (ref 11.3–14.5)
WBC: 4.3 10*3/uL (ref 3.9–10.0)
lymph#: 1.3 10*3/uL (ref 0.9–3.3)

## 2006-12-03 ENCOUNTER — Ambulatory Visit: Payer: Self-pay | Admitting: Hematology and Oncology

## 2006-12-03 LAB — COMPREHENSIVE METABOLIC PANEL
ALT: 14 U/L (ref 0–35)
AST: 19 U/L (ref 0–37)
Albumin: 3.9 g/dL (ref 3.5–5.2)
Alkaline Phosphatase: 63 U/L (ref 39–117)
BUN: 14 mg/dL (ref 6–23)
CO2: 31 mEq/L (ref 19–32)
Calcium: 9 mg/dL (ref 8.4–10.5)
Chloride: 102 mEq/L (ref 96–112)
Creatinine, Ser: 0.65 mg/dL (ref 0.40–1.20)
Glucose, Bld: 94 mg/dL (ref 70–99)
Potassium: 3.8 mEq/L (ref 3.5–5.3)
Sodium: 140 mEq/L (ref 135–145)
Total Bilirubin: 0.4 mg/dL (ref 0.3–1.2)
Total Protein: 6.6 g/dL (ref 6.0–8.3)

## 2006-12-03 LAB — CBC WITH DIFFERENTIAL/PLATELET
BASO%: 0.5 % (ref 0.0–2.0)
Basophils Absolute: 0 10*3/uL (ref 0.0–0.1)
EOS%: 4 % (ref 0.0–7.0)
Eosinophils Absolute: 0.2 10*3/uL (ref 0.0–0.5)
HCT: 33 % — ABNORMAL LOW (ref 34.8–46.6)
HGB: 11.6 g/dL (ref 11.6–15.9)
LYMPH%: 37.3 % (ref 14.0–48.0)
MCH: 33.3 pg (ref 26.0–34.0)
MCHC: 35 g/dL (ref 32.0–36.0)
MCV: 95.1 fL (ref 81.0–101.0)
MONO#: 0.4 10*3/uL (ref 0.1–0.9)
MONO%: 11 % (ref 0.0–13.0)
NEUT#: 1.8 10*3/uL (ref 1.5–6.5)
NEUT%: 47.2 % (ref 39.6–76.8)
Platelets: 203 10*3/uL (ref 145–400)
RBC: 3.48 10*6/uL — ABNORMAL LOW (ref 3.70–5.32)
RDW: 13.1 % (ref 11.3–14.5)
WBC: 3.7 10*3/uL — ABNORMAL LOW (ref 3.9–10.0)
lymph#: 1.4 10*3/uL (ref 0.9–3.3)

## 2006-12-04 ENCOUNTER — Ambulatory Visit (HOSPITAL_COMMUNITY): Admission: RE | Admit: 2006-12-04 | Discharge: 2006-12-04 | Payer: Self-pay | Admitting: Hematology and Oncology

## 2007-02-28 ENCOUNTER — Ambulatory Visit: Payer: Self-pay | Admitting: Hematology and Oncology

## 2007-03-04 LAB — CBC WITH DIFFERENTIAL/PLATELET
BASO%: 0.5 % (ref 0.0–2.0)
Basophils Absolute: 0 10*3/uL (ref 0.0–0.1)
EOS%: 2.7 % (ref 0.0–7.0)
Eosinophils Absolute: 0.1 10*3/uL (ref 0.0–0.5)
HCT: 34.1 % — ABNORMAL LOW (ref 34.8–46.6)
HGB: 12 g/dL (ref 11.6–15.9)
LYMPH%: 38.5 % (ref 14.0–48.0)
MCH: 33.8 pg (ref 26.0–34.0)
MCHC: 35.3 g/dL (ref 32.0–36.0)
MCV: 95.9 fL (ref 81.0–101.0)
MONO#: 0.4 10*3/uL (ref 0.1–0.9)
MONO%: 11.8 % (ref 0.0–13.0)
NEUT#: 1.8 10*3/uL (ref 1.5–6.5)
NEUT%: 46.5 % (ref 39.6–76.8)
Platelets: 225 10*3/uL (ref 145–400)
RBC: 3.56 10*6/uL — ABNORMAL LOW (ref 3.70–5.32)
RDW: 12.9 % (ref 11.3–14.5)
WBC: 3.8 10*3/uL — ABNORMAL LOW (ref 3.9–10.0)
lymph#: 1.5 10*3/uL (ref 0.9–3.3)

## 2007-03-04 LAB — COMPREHENSIVE METABOLIC PANEL
ALT: 13 U/L (ref 0–35)
AST: 17 U/L (ref 0–37)
Albumin: 4.5 g/dL (ref 3.5–5.2)
Alkaline Phosphatase: 73 U/L (ref 39–117)
BUN: 15 mg/dL (ref 6–23)
CO2: 29 mEq/L (ref 19–32)
Calcium: 9.2 mg/dL (ref 8.4–10.5)
Chloride: 102 mEq/L (ref 96–112)
Creatinine, Ser: 0.69 mg/dL (ref 0.40–1.20)
Glucose, Bld: 116 mg/dL — ABNORMAL HIGH (ref 70–99)
Potassium: 3.9 mEq/L (ref 3.5–5.3)
Sodium: 141 mEq/L (ref 135–145)
Total Bilirubin: 0.4 mg/dL (ref 0.3–1.2)
Total Protein: 7 g/dL (ref 6.0–8.3)

## 2007-03-06 ENCOUNTER — Ambulatory Visit (HOSPITAL_COMMUNITY): Admission: RE | Admit: 2007-03-06 | Discharge: 2007-03-06 | Payer: Self-pay | Admitting: Hematology and Oncology

## 2007-07-02 ENCOUNTER — Ambulatory Visit: Payer: Self-pay | Admitting: Hematology and Oncology

## 2007-07-04 LAB — CBC WITH DIFFERENTIAL/PLATELET
BASO%: 0.8 % (ref 0.0–2.0)
Basophils Absolute: 0 10*3/uL (ref 0.0–0.1)
EOS%: 4.9 % (ref 0.0–7.0)
Eosinophils Absolute: 0.2 10*3/uL (ref 0.0–0.5)
HCT: 34.5 % — ABNORMAL LOW (ref 34.8–46.6)
HGB: 11.9 g/dL (ref 11.6–15.9)
LYMPH%: 37.3 % (ref 14.0–48.0)
MCH: 33.2 pg (ref 26.0–34.0)
MCHC: 34.4 g/dL (ref 32.0–36.0)
MCV: 96.3 fL (ref 81.0–101.0)
MONO#: 0.4 10*3/uL (ref 0.1–0.9)
MONO%: 11.2 % (ref 0.0–13.0)
NEUT#: 1.8 10*3/uL (ref 1.5–6.5)
NEUT%: 45.8 % (ref 39.6–76.8)
Platelets: 213 10*3/uL (ref 145–400)
RBC: 3.59 10*6/uL — ABNORMAL LOW (ref 3.70–5.32)
RDW: 12.8 % (ref 11.3–14.5)
WBC: 3.9 10*3/uL (ref 3.9–10.0)
lymph#: 1.4 10*3/uL (ref 0.9–3.3)

## 2007-07-04 LAB — COMPREHENSIVE METABOLIC PANEL
ALT: 13 U/L (ref 0–35)
AST: 16 U/L (ref 0–37)
Albumin: 4.3 g/dL (ref 3.5–5.2)
Alkaline Phosphatase: 83 U/L (ref 39–117)
BUN: 14 mg/dL (ref 6–23)
CO2: 28 mEq/L (ref 19–32)
Calcium: 9.3 mg/dL (ref 8.4–10.5)
Chloride: 104 mEq/L (ref 96–112)
Creatinine, Ser: 0.76 mg/dL (ref 0.40–1.20)
Glucose, Bld: 108 mg/dL — ABNORMAL HIGH (ref 70–99)
Potassium: 4.3 mEq/L (ref 3.5–5.3)
Sodium: 142 mEq/L (ref 135–145)
Total Bilirubin: 0.3 mg/dL (ref 0.3–1.2)
Total Protein: 6.9 g/dL (ref 6.0–8.3)

## 2007-09-11 ENCOUNTER — Other Ambulatory Visit: Admission: RE | Admit: 2007-09-11 | Discharge: 2007-09-11 | Payer: Self-pay | Admitting: Gynecology

## 2007-10-27 ENCOUNTER — Ambulatory Visit: Payer: Self-pay | Admitting: Hematology and Oncology

## 2007-10-29 ENCOUNTER — Ambulatory Visit (HOSPITAL_COMMUNITY): Admission: RE | Admit: 2007-10-29 | Discharge: 2007-10-29 | Payer: Self-pay | Admitting: Hematology and Oncology

## 2008-01-09 ENCOUNTER — Emergency Department (HOSPITAL_COMMUNITY): Admission: EM | Admit: 2008-01-09 | Discharge: 2008-01-09 | Payer: Self-pay | Admitting: Family Medicine

## 2008-05-03 ENCOUNTER — Ambulatory Visit: Payer: Self-pay | Admitting: Hematology and Oncology

## 2008-05-05 LAB — CBC WITH DIFFERENTIAL/PLATELET
BASO%: 0.4 % (ref 0.0–2.0)
Basophils Absolute: 0 10*3/uL (ref 0.0–0.1)
EOS%: 3.4 % (ref 0.0–7.0)
Eosinophils Absolute: 0.1 10*3/uL (ref 0.0–0.5)
HCT: 33.3 % — ABNORMAL LOW (ref 34.8–46.6)
HGB: 11.5 g/dL — ABNORMAL LOW (ref 11.6–15.9)
LYMPH%: 27.9 % (ref 14.0–48.0)
MCH: 32.9 pg (ref 26.0–34.0)
MCHC: 34.5 g/dL (ref 32.0–36.0)
MCV: 95.4 fL (ref 81.0–101.0)
MONO#: 0.5 10*3/uL (ref 0.1–0.9)
MONO%: 11.2 % (ref 0.0–13.0)
NEUT#: 2.4 10*3/uL (ref 1.5–6.5)
NEUT%: 57.1 % (ref 39.6–76.8)
Platelets: 234 10*3/uL (ref 145–400)
RBC: 3.49 10*6/uL — ABNORMAL LOW (ref 3.70–5.32)
RDW: 13.3 % (ref 11.3–14.5)
WBC: 4.2 10*3/uL (ref 3.9–10.0)
lymph#: 1.2 10*3/uL (ref 0.9–3.3)

## 2008-05-05 LAB — COMPREHENSIVE METABOLIC PANEL
ALT: 16 U/L (ref 0–35)
AST: 20 U/L (ref 0–37)
Albumin: 4.4 g/dL (ref 3.5–5.2)
Alkaline Phosphatase: 74 U/L (ref 39–117)
BUN: 16 mg/dL (ref 6–23)
CO2: 27 mEq/L (ref 19–32)
Calcium: 9.6 mg/dL (ref 8.4–10.5)
Chloride: 104 mEq/L (ref 96–112)
Creatinine, Ser: 0.8 mg/dL (ref 0.40–1.20)
Glucose, Bld: 88 mg/dL (ref 70–99)
Potassium: 4.4 mEq/L (ref 3.5–5.3)
Sodium: 142 mEq/L (ref 135–145)
Total Bilirubin: 0.4 mg/dL (ref 0.3–1.2)
Total Protein: 7 g/dL (ref 6.0–8.3)

## 2008-09-30 ENCOUNTER — Other Ambulatory Visit: Admission: RE | Admit: 2008-09-30 | Discharge: 2008-09-30 | Payer: Self-pay | Admitting: Gynecology

## 2008-09-30 ENCOUNTER — Ambulatory Visit: Payer: Self-pay | Admitting: Gynecology

## 2008-09-30 ENCOUNTER — Encounter: Payer: Self-pay | Admitting: Gynecology

## 2008-10-27 ENCOUNTER — Ambulatory Visit: Payer: Self-pay | Admitting: Hematology and Oncology

## 2008-10-29 ENCOUNTER — Ambulatory Visit (HOSPITAL_COMMUNITY): Admission: RE | Admit: 2008-10-29 | Discharge: 2008-10-29 | Payer: Self-pay | Admitting: Hematology and Oncology

## 2008-10-29 LAB — CBC WITH DIFFERENTIAL/PLATELET
BASO%: 0.6 % (ref 0.0–2.0)
Basophils Absolute: 0 10*3/uL (ref 0.0–0.1)
EOS%: 3.2 % (ref 0.0–7.0)
Eosinophils Absolute: 0.1 10*3/uL (ref 0.0–0.5)
HCT: 35.8 % (ref 34.8–46.6)
HGB: 12.2 g/dL (ref 11.6–15.9)
LYMPH%: 33.1 % (ref 14.0–49.7)
MCH: 32.6 pg (ref 25.1–34.0)
MCHC: 34 g/dL (ref 31.5–36.0)
MCV: 95.9 fL (ref 79.5–101.0)
MONO#: 0.5 10*3/uL (ref 0.1–0.9)
MONO%: 9.9 % (ref 0.0–14.0)
NEUT#: 2.5 10*3/uL (ref 1.5–6.5)
NEUT%: 53.2 % (ref 38.4–76.8)
Platelets: 252 10*3/uL (ref 145–400)
RBC: 3.73 10*6/uL (ref 3.70–5.45)
RDW: 13.3 % (ref 11.2–14.5)
WBC: 4.7 10*3/uL (ref 3.9–10.3)
lymph#: 1.5 10*3/uL (ref 0.9–3.3)

## 2008-10-29 LAB — COMPREHENSIVE METABOLIC PANEL
ALT: 19 U/L (ref 0–35)
AST: 25 U/L (ref 0–37)
Albumin: 4.3 g/dL (ref 3.5–5.2)
Alkaline Phosphatase: 59 U/L (ref 39–117)
BUN: 12 mg/dL (ref 6–23)
CO2: 33 mEq/L — ABNORMAL HIGH (ref 19–32)
Calcium: 9.9 mg/dL (ref 8.4–10.5)
Chloride: 102 mEq/L (ref 96–112)
Creatinine, Ser: 0.81 mg/dL (ref 0.40–1.20)
Glucose, Bld: 89 mg/dL (ref 70–99)
Potassium: 4.1 mEq/L (ref 3.5–5.3)
Sodium: 143 mEq/L (ref 135–145)
Total Bilirubin: 0.8 mg/dL (ref 0.3–1.2)
Total Protein: 7.3 g/dL (ref 6.0–8.3)

## 2009-04-26 ENCOUNTER — Ambulatory Visit: Payer: Self-pay | Admitting: Hematology and Oncology

## 2009-04-28 LAB — COMPREHENSIVE METABOLIC PANEL
ALT: 14 U/L (ref 0–35)
AST: 21 U/L (ref 0–37)
Albumin: 4.3 g/dL (ref 3.5–5.2)
Alkaline Phosphatase: 74 U/L (ref 39–117)
BUN: 15 mg/dL (ref 6–23)
CO2: 28 mEq/L (ref 19–32)
Calcium: 9.1 mg/dL (ref 8.4–10.5)
Chloride: 103 mEq/L (ref 96–112)
Creatinine, Ser: 1.11 mg/dL (ref 0.40–1.20)
Glucose, Bld: 110 mg/dL — ABNORMAL HIGH (ref 70–99)
Potassium: 4.2 mEq/L (ref 3.5–5.3)
Sodium: 143 mEq/L (ref 135–145)
Total Bilirubin: 0.3 mg/dL (ref 0.3–1.2)
Total Protein: 6.9 g/dL (ref 6.0–8.3)

## 2009-04-28 LAB — CBC WITH DIFFERENTIAL/PLATELET
BASO%: 0.6 % (ref 0.0–2.0)
Basophils Absolute: 0 10*3/uL (ref 0.0–0.1)
EOS%: 3.4 % (ref 0.0–7.0)
Eosinophils Absolute: 0.2 10*3/uL (ref 0.0–0.5)
HCT: 34.5 % — ABNORMAL LOW (ref 34.8–46.6)
HGB: 11.7 g/dL (ref 11.6–15.9)
LYMPH%: 31.3 % (ref 14.0–49.7)
MCH: 32.5 pg (ref 25.1–34.0)
MCHC: 33.8 g/dL (ref 31.5–36.0)
MCV: 96 fL (ref 79.5–101.0)
MONO#: 0.4 10*3/uL (ref 0.1–0.9)
MONO%: 8.4 % (ref 0.0–14.0)
NEUT#: 2.8 10*3/uL (ref 1.5–6.5)
NEUT%: 56.3 % (ref 38.4–76.8)
Platelets: 244 10*3/uL (ref 145–400)
RBC: 3.59 10*6/uL — ABNORMAL LOW (ref 3.70–5.45)
RDW: 13.1 % (ref 11.2–14.5)
WBC: 5 10*3/uL (ref 3.9–10.3)
lymph#: 1.6 10*3/uL (ref 0.9–3.3)

## 2009-04-28 LAB — CEA: CEA: 0.9 ng/mL (ref 0.0–5.0)

## 2009-09-07 ENCOUNTER — Emergency Department (HOSPITAL_COMMUNITY): Admission: EM | Admit: 2009-09-07 | Discharge: 2009-09-07 | Payer: Self-pay | Admitting: Emergency Medicine

## 2009-09-08 ENCOUNTER — Emergency Department (HOSPITAL_COMMUNITY): Admission: EM | Admit: 2009-09-08 | Discharge: 2009-09-08 | Payer: Self-pay | Admitting: Emergency Medicine

## 2009-10-31 ENCOUNTER — Ambulatory Visit: Payer: Self-pay | Admitting: Hematology and Oncology

## 2009-11-01 ENCOUNTER — Ambulatory Visit (HOSPITAL_COMMUNITY): Admission: RE | Admit: 2009-11-01 | Discharge: 2009-11-01 | Payer: Self-pay | Admitting: Oncology

## 2009-11-01 LAB — COMPREHENSIVE METABOLIC PANEL
ALT: 14 U/L (ref 0–35)
AST: 17 U/L (ref 0–37)
Albumin: 4.5 g/dL (ref 3.5–5.2)
Alkaline Phosphatase: 76 U/L (ref 39–117)
BUN: 19 mg/dL (ref 6–23)
CO2: 29 mEq/L (ref 19–32)
Calcium: 9.7 mg/dL (ref 8.4–10.5)
Chloride: 102 mEq/L (ref 96–112)
Creatinine, Ser: 0.72 mg/dL (ref 0.40–1.20)
Glucose, Bld: 88 mg/dL (ref 70–99)
Potassium: 4.2 mEq/L (ref 3.5–5.3)
Sodium: 141 mEq/L (ref 135–145)
Total Bilirubin: 0.5 mg/dL (ref 0.3–1.2)
Total Protein: 7.1 g/dL (ref 6.0–8.3)

## 2009-11-01 LAB — CBC WITH DIFFERENTIAL/PLATELET
BASO%: 0.4 % (ref 0.0–2.0)
Basophils Absolute: 0 10*3/uL (ref 0.0–0.1)
EOS%: 2.5 % (ref 0.0–7.0)
Eosinophils Absolute: 0.1 10*3/uL (ref 0.0–0.5)
HCT: 36.3 % (ref 34.8–46.6)
HGB: 12.3 g/dL (ref 11.6–15.9)
LYMPH%: 28.5 % (ref 14.0–49.7)
MCH: 32 pg (ref 25.1–34.0)
MCHC: 33.8 g/dL (ref 31.5–36.0)
MCV: 94.9 fL (ref 79.5–101.0)
MONO#: 0.7 10*3/uL (ref 0.1–0.9)
MONO%: 12.4 % (ref 0.0–14.0)
NEUT#: 3.1 10*3/uL (ref 1.5–6.5)
NEUT%: 56.2 % (ref 38.4–76.8)
Platelets: 253 10*3/uL (ref 145–400)
RBC: 3.83 10*6/uL (ref 3.70–5.45)
RDW: 13.7 % (ref 11.2–14.5)
WBC: 5.4 10*3/uL (ref 3.9–10.3)
lymph#: 1.5 10*3/uL (ref 0.9–3.3)

## 2009-11-01 LAB — LACTATE DEHYDROGENASE: LDH: 165 U/L (ref 94–250)

## 2010-04-27 ENCOUNTER — Ambulatory Visit: Payer: Self-pay | Admitting: Hematology and Oncology

## 2010-05-01 ENCOUNTER — Ambulatory Visit (HOSPITAL_COMMUNITY): Admission: RE | Admit: 2010-05-01 | Discharge: 2010-05-01 | Payer: Self-pay | Admitting: Hematology and Oncology

## 2010-05-01 LAB — COMPREHENSIVE METABOLIC PANEL
ALT: 21 U/L (ref 0–35)
AST: 24 U/L (ref 0–37)
Albumin: 4 g/dL (ref 3.5–5.2)
Alkaline Phosphatase: 58 U/L (ref 39–117)
BUN: 17 mg/dL (ref 6–23)
CO2: 29 mEq/L (ref 19–32)
Calcium: 9.7 mg/dL (ref 8.4–10.5)
Chloride: 104 mEq/L (ref 96–112)
Creatinine, Ser: 0.83 mg/dL (ref 0.40–1.20)
Glucose, Bld: 103 mg/dL — ABNORMAL HIGH (ref 70–99)
Potassium: 4 mEq/L (ref 3.5–5.3)
Sodium: 143 mEq/L (ref 135–145)
Total Bilirubin: 0.4 mg/dL (ref 0.3–1.2)
Total Protein: 7.2 g/dL (ref 6.0–8.3)

## 2010-05-01 LAB — CBC WITH DIFFERENTIAL/PLATELET
BASO%: 0.5 % (ref 0.0–2.0)
Basophils Absolute: 0 10*3/uL (ref 0.0–0.1)
EOS%: 4.1 % (ref 0.0–7.0)
Eosinophils Absolute: 0.2 10*3/uL (ref 0.0–0.5)
HCT: 35.5 % (ref 34.8–46.6)
HGB: 12 g/dL (ref 11.6–15.9)
LYMPH%: 30.2 % (ref 14.0–49.7)
MCH: 32.6 pg (ref 25.1–34.0)
MCHC: 33.8 g/dL (ref 31.5–36.0)
MCV: 96.4 fL (ref 79.5–101.0)
MONO#: 0.6 10*3/uL (ref 0.1–0.9)
MONO%: 12.4 % (ref 0.0–14.0)
NEUT#: 2.4 10*3/uL (ref 1.5–6.5)
NEUT%: 52.8 % (ref 38.4–76.8)
Platelets: 238 10*3/uL (ref 145–400)
RBC: 3.69 10*6/uL — ABNORMAL LOW (ref 3.70–5.45)
RDW: 12.7 % (ref 11.2–14.5)
WBC: 4.6 10*3/uL (ref 3.9–10.3)
lymph#: 1.4 10*3/uL (ref 0.9–3.3)

## 2010-05-01 LAB — LACTATE DEHYDROGENASE: LDH: 171 U/L (ref 94–250)

## 2010-05-09 ENCOUNTER — Ambulatory Visit (HOSPITAL_COMMUNITY)
Admission: RE | Admit: 2010-05-09 | Discharge: 2010-05-09 | Payer: Self-pay | Source: Home / Self Care | Admitting: Hematology and Oncology

## 2010-05-31 ENCOUNTER — Ambulatory Visit: Payer: Self-pay | Admitting: Cardiovascular Disease

## 2010-06-12 ENCOUNTER — Emergency Department (HOSPITAL_COMMUNITY)
Admission: EM | Admit: 2010-06-12 | Discharge: 2010-06-12 | Payer: Self-pay | Source: Home / Self Care | Admitting: Emergency Medicine

## 2010-08-17 HISTORY — PX: US ECHOCARDIOGRAPHY: HXRAD669

## 2010-08-21 ENCOUNTER — Other Ambulatory Visit: Payer: Self-pay | Admitting: Internal Medicine

## 2010-08-21 DIAGNOSIS — I722 Aneurysm of renal artery: Secondary | ICD-10-CM

## 2010-08-24 ENCOUNTER — Ambulatory Visit
Admission: RE | Admit: 2010-08-24 | Discharge: 2010-08-24 | Disposition: A | Discharge status: Home / Self Care | Payer: Medicare Other | Source: Ambulatory Visit | Attending: Internal Medicine | Admitting: Internal Medicine

## 2010-08-24 ENCOUNTER — Other Ambulatory Visit: Payer: Self-pay | Admitting: Orthopedic Surgery

## 2010-08-24 DIAGNOSIS — I722 Aneurysm of renal artery: Secondary | ICD-10-CM

## 2010-08-24 MED ORDER — IOHEXOL 350 MG/ML SOLN
125.0000 mL | Freq: Once | INTRAVENOUS | Status: AC | PRN
  Administered 2010-08-24: 125 mL via INTRAVENOUS

## 2010-08-28 LAB — POCT URINALYSIS DIPSTICK
Bilirubin Urine: NEGATIVE
Glucose, UA: NEGATIVE mg/dL
Ketones, ur: NEGATIVE mg/dL
Nitrite: NEGATIVE
Protein, ur: NEGATIVE mg/dL
Specific Gravity, Urine: 1.01 (ref 1.005–1.030)
Urobilinogen, UA: 0.2 mg/dL (ref 0.0–1.0)
pH: 7 (ref 5.0–8.0)

## 2010-08-31 ENCOUNTER — Ambulatory Visit (INDEPENDENT_AMBULATORY_CARE_PROVIDER_SITE_OTHER): Payer: Medicare Other | Admitting: Cardiovascular Disease

## 2010-08-31 DIAGNOSIS — I5022 Chronic systolic (congestive) heart failure: Secondary | ICD-10-CM

## 2010-08-31 DIAGNOSIS — I447 Left bundle-branch block, unspecified: Secondary | ICD-10-CM

## 2010-09-11 ENCOUNTER — Other Ambulatory Visit (HOSPITAL_COMMUNITY): Payer: Self-pay | Admitting: Radiology

## 2010-09-11 ENCOUNTER — Other Ambulatory Visit (HOSPITAL_COMMUNITY): Payer: Self-pay | Admitting: Internal Medicine

## 2010-09-11 DIAGNOSIS — I509 Heart failure, unspecified: Secondary | ICD-10-CM

## 2010-09-11 DIAGNOSIS — R9431 Abnormal electrocardiogram [ECG] [EKG]: Secondary | ICD-10-CM

## 2010-09-11 LAB — URINALYSIS, ROUTINE W REFLEX MICROSCOPIC
Bilirubin Urine: NEGATIVE
Glucose, UA: NEGATIVE mg/dL
Ketones, ur: NEGATIVE mg/dL
Leukocytes, UA: NEGATIVE
Nitrite: NEGATIVE
Protein, ur: NEGATIVE mg/dL
Specific Gravity, Urine: 1.002 — ABNORMAL LOW (ref 1.005–1.030)
Urobilinogen, UA: 0.2 mg/dL (ref 0.0–1.0)
pH: 8 (ref 5.0–8.0)

## 2010-09-11 LAB — POCT I-STAT, CHEM 8
BUN: 20 mg/dL (ref 6–23)
BUN: 20 mg/dL (ref 6–23)
Calcium, Ion: 1.17 mmol/L (ref 1.12–1.32)
Calcium, Ion: 1.17 mmol/L (ref 1.12–1.32)
Chloride: 108 mEq/L (ref 96–112)
Chloride: 109 mEq/L (ref 96–112)
Creatinine, Ser: 0.8 mg/dL (ref 0.4–1.2)
Creatinine, Ser: 0.9 mg/dL (ref 0.4–1.2)
Glucose, Bld: 101 mg/dL — ABNORMAL HIGH (ref 70–99)
Glucose, Bld: 118 mg/dL — ABNORMAL HIGH (ref 70–99)
HCT: 36 % (ref 36.0–46.0)
HCT: 38 % (ref 36.0–46.0)
Hemoglobin: 12.2 g/dL (ref 12.0–15.0)
Hemoglobin: 12.9 g/dL (ref 12.0–15.0)
Potassium: 3.6 mEq/L (ref 3.5–5.1)
Potassium: 3.7 mEq/L (ref 3.5–5.1)
Sodium: 141 mEq/L (ref 135–145)
Sodium: 141 mEq/L (ref 135–145)
TCO2: 24 mmol/L (ref 0–100)
TCO2: 26 mmol/L (ref 0–100)

## 2010-09-11 LAB — CBC
HCT: 35.4 % — ABNORMAL LOW (ref 36.0–46.0)
Hemoglobin: 12.1 g/dL (ref 12.0–15.0)
MCHC: 34.3 g/dL (ref 30.0–36.0)
MCV: 93.7 fL (ref 78.0–100.0)
Platelets: 218 10*3/uL (ref 150–400)
RBC: 3.78 MIL/uL — ABNORMAL LOW (ref 3.87–5.11)
RDW: 13.5 % (ref 11.5–15.5)
WBC: 5.4 10*3/uL (ref 4.0–10.5)

## 2010-09-11 LAB — URINE MICROSCOPIC-ADD ON

## 2010-09-12 ENCOUNTER — Ambulatory Visit (HOSPITAL_COMMUNITY): Payer: Medicare Other | Attending: Internal Medicine | Admitting: Radiology

## 2010-09-12 ENCOUNTER — Other Ambulatory Visit (HOSPITAL_COMMUNITY): Payer: Medicare Other

## 2010-09-12 ENCOUNTER — Other Ambulatory Visit (HOSPITAL_COMMUNITY): Payer: Self-pay | Admitting: Cardiovascular Disease

## 2010-09-12 DIAGNOSIS — C189 Malignant neoplasm of colon, unspecified: Secondary | ICD-10-CM | POA: Insufficient documentation

## 2010-09-12 DIAGNOSIS — I509 Heart failure, unspecified: Secondary | ICD-10-CM | POA: Insufficient documentation

## 2010-09-12 DIAGNOSIS — I059 Rheumatic mitral valve disease, unspecified: Secondary | ICD-10-CM | POA: Insufficient documentation

## 2010-09-12 DIAGNOSIS — I447 Left bundle-branch block, unspecified: Secondary | ICD-10-CM | POA: Insufficient documentation

## 2010-09-12 DIAGNOSIS — I079 Rheumatic tricuspid valve disease, unspecified: Secondary | ICD-10-CM | POA: Insufficient documentation

## 2010-09-12 DIAGNOSIS — I519 Heart disease, unspecified: Secondary | ICD-10-CM | POA: Insufficient documentation

## 2010-09-14 ENCOUNTER — Telehealth: Payer: Self-pay | Admitting: Cardiovascular Disease

## 2010-09-14 NOTE — Telephone Encounter (Signed)
I spoke with Mrs. Barz today. She's on Carvedilol 6.25 mg twice a day and losartan 100 mg a day. She does not tolerate her carvedilol been she's presently on.  Her most recent echocardiogram reveals an ejection fraction of 30-35%. She has a left bundle-branch block.  I've suggested to her that she may need an ICD/biventricular pacer. She was concerned that she may need to have MRI scans for followup of her colon cancer. She apparently was diagnosed with colon cancer several years ago and has periodic MRIs for evaluation.  I would like to hear from Dr. Dalene Carrow Regarding Mrs. Crisp his prognosis from a cancer standpoint. If Mrs. Fowles will need to have MRIs on a regular basis, then we may need to wait and not place a defibrillator at this point.  On the other hand, if her most recent cancer screenings have been normal and she has a low likelihood of having recurrent colon cancer, I think she would benefit greatly from a biventricular pacer/ICD.  Will send Dr. Dalene Carrow a copy of this memo.

## 2010-09-20 ENCOUNTER — Other Ambulatory Visit: Payer: Self-pay | Admitting: Cardiovascular Disease

## 2010-09-20 ENCOUNTER — Telehealth: Payer: Self-pay | Admitting: Cardiovascular Disease

## 2010-09-20 NOTE — Telephone Encounter (Signed)
App for bi-v ICD with Dr Graciela Husbands 09/29/10 @2 :00 pt called and verbalized understanding.Alfonso Ramus RN

## 2010-09-25 ENCOUNTER — Encounter: Payer: Self-pay | Admitting: Cardiology

## 2010-09-26 ENCOUNTER — Encounter: Payer: Self-pay | Admitting: Internal Medicine

## 2010-09-28 ENCOUNTER — Encounter: Payer: Self-pay | Admitting: Cardiovascular Disease

## 2010-09-29 ENCOUNTER — Ambulatory Visit (INDEPENDENT_AMBULATORY_CARE_PROVIDER_SITE_OTHER): Payer: Medicare Other | Admitting: Internal Medicine

## 2010-09-29 ENCOUNTER — Encounter: Payer: Self-pay | Admitting: Internal Medicine

## 2010-09-29 ENCOUNTER — Encounter: Payer: Self-pay | Admitting: *Deleted

## 2010-09-29 DIAGNOSIS — G4733 Obstructive sleep apnea (adult) (pediatric): Secondary | ICD-10-CM

## 2010-09-29 DIAGNOSIS — G4730 Sleep apnea, unspecified: Secondary | ICD-10-CM

## 2010-09-29 DIAGNOSIS — R0609 Other forms of dyspnea: Secondary | ICD-10-CM

## 2010-09-29 DIAGNOSIS — I447 Left bundle-branch block, unspecified: Secondary | ICD-10-CM

## 2010-09-29 DIAGNOSIS — R0683 Snoring: Secondary | ICD-10-CM

## 2010-09-29 DIAGNOSIS — I509 Heart failure, unspecified: Secondary | ICD-10-CM

## 2010-09-29 DIAGNOSIS — G478 Other sleep disorders: Secondary | ICD-10-CM

## 2010-09-29 DIAGNOSIS — I5022 Chronic systolic (congestive) heart failure: Secondary | ICD-10-CM

## 2010-09-29 DIAGNOSIS — I428 Other cardiomyopathies: Secondary | ICD-10-CM

## 2010-09-29 NOTE — Patient Instructions (Signed)
Your physician recommends that you continue on your current medications as directed. Please refer to the Current Medication list given to you today. Your physician recommends that you schedule a follow-up appointment in: AFTER DEVICE IMPLANT  Your physician has recommended that you have a sleep study. This test records several body functions during sleep, including: brain activity, eye movement, oxygen and carbon dioxide blood levels, heart rate and rhythm, breathing rate and rhythm, the flow of air through your mouth and nose, snoring, body muscle movements, and chest and belly movement. Your physician recommends that you return for lab work in: [facility] 10/02/10 AT 11:00 AM BMET CBC PT PTT DX V72.81

## 2010-09-29 NOTE — Assessment & Plan Note (Addendum)
The patient has a nonischemic cardiomyopathy of unknown duration And unclear cause. Furthermore She is on short-term medical therapy. As such, it would be appropriate to optimize pharmacological therapy and then reassess symptoms and left ventricular function prior to deciding whether ICD implantation and/or CRT is appropriate.  To that end we will increase her carvedilol and begin her on Aldactone.

## 2010-09-29 NOTE — Assessment & Plan Note (Signed)
She has significant class III symptoms of heart failure. Medical therapy of titration with Aldactone beta blockers and possibly diuretics may be of benefit. I will defer this to Dr. Jamse Mead

## 2010-09-29 NOTE — Progress Notes (Signed)
HPI: Samantha [name] is a 70 y.o. female is. Seen at the request of Dr.Nahser For consideration of CRT-D implantation.  She has a long-standing nonischemic myopathy initially diagnosed in 2000. 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 I don't know whether she took any cardio toxins. I would presume no as there are no measurements in the hospital record prior to 2012 ALT left ventricular function  Repeat ultrasound March 2012 demonstrated persistent LV dysfunction with an EF of 30-35%. There is no significant valvular abnormalities.  She has class III heart failure. She is dyspneic walking less than 100 feet. She is unable to climb stairs. She has abdominal bloating early satiety and peripheral edema. She does not have nocturnal dyspnea or orthopnea.  As best as I can tell, the carvedilol was just started.   She has had problems with palpitations. These are unassociated with lightheadedness or worsening dyspnea.  She has had no syncope.    Current Outpatient Prescriptions  Medication Sig Dispense Refill  . CALCIUM PO Take by mouth.        . carvedilol (COREG) 3.125 MG tablet 6.25 mg 2 (two) times daily with a meal.       . Cetirizine HCl (ZYRTEC PO) Take by mouth.        . Cholecalciferol (VITAMIN D3) 1000 UNITS CAPS Take 1 capsule by mouth.        . citalopram (CELEXA) 40 MG tablet 1 tab po qd/ pt not taking regularly       . losartan (COZAAR) 50 MG tablet 100 mg daily. 1 tab po qd      . DISCONTD: valACYclovir (VALTREX) 500 MG tablet 1 tab po qd         Allergies  Allergen Reactions  . Codeine   . Iohexol      Desc: Pt. states she broke out in hives 30 yrs ago w/ a CT Brain scan.  She has since had a heart cath and this CT today (08/10/05) w/o premeds and  did well. No evident reaction.  Thanks.     Past Medical History  Diagnosis Date  . Conjunctivitis   . Cancer of colon   . Febrile neutropenia   . Anemia   . Hypokalemia   . Mucositis   . LBBB (left bundle branch block)     Past Surgical History  Procedure Date  . Appendectomy   . Cesarean section   . Cholecystectomy   . Tonsillectomy   . Port-a-cath removal     No family history on file.  History   Social History  . Marital Status: Divorced    Spouse Name: N/A    Number of Children: N/A  . Years of Education: N/A   Occupational History  . Not on file.   Social History Main Topics  . Smoking status: Never Smoker   . Smokeless tobacco: Not on file  . Alcohol Use: No  . Drug Use: Not on file  . Sexually Active: Not on file   Other Topics Concern  . Not on file   Social History Narrative  . No narrative on file    Fourteen point review of systems was negative except as noted in HPI and PMH Except renal artery aneurysm, cold intolerance and Floaters she also has  colonic polyps and allergies. PHYSICAL EXAMINATION  Blood pressure 132/74, pulse 65, height 5\' 5"  (1.651 m), weight 176 lb (79.833 kg).   Well developed and nourished Older Caucasian female appearing her stated age in no acute distress HENT normal Neck supple with JVP-flat Carotids brisk and full without bruits Back without scoliosis or kyphosis Clear Regular rate and rhythm, ; PMI was displaced no murmurs or gallops Abd-soft with active BS without hepatomegaly or midline pulsation Femoral pulses 2+ distal pulses intact No Clubbing cyanosis edema Skin-warm and dry LN-neg submandibular and supraclavicular A & Oriented CN 3-12 normal  Grossly normal sensory and motor function Affect engaging .  ECG from Dr. Melburn Popper resolved this demonstrated sinus rhythm at 74 with an old 0.15/21/0.43 Axis is 30

## 2010-09-29 NOTE — Assessment & Plan Note (Signed)
In the event that she requires ICD therapy, with her broad QRS, , She would be. A good candidate for CRT

## 2010-10-02 ENCOUNTER — Other Ambulatory Visit: Payer: Medicare Other | Admitting: *Deleted

## 2010-10-05 ENCOUNTER — Telehealth: Payer: Self-pay | Admitting: *Deleted

## 2010-10-05 DIAGNOSIS — R0609 Other forms of dyspnea: Secondary | ICD-10-CM

## 2010-10-05 MED ORDER — CARVEDILOL 12.5 MG PO TABS: 12.5000 mg | ORAL_TABLET | Freq: Two times a day (BID) | ORAL | Status: DC

## 2010-10-05 MED ORDER — SPIRONOLACTONE 25 MG PO TABS: 25.0000 mg | ORAL_TABLET | Freq: Every day | ORAL | Status: DC

## 2010-10-05 NOTE — Telephone Encounter (Signed)
Ordered new meds and bmet for 5/4.

## 2010-10-10 ENCOUNTER — Ambulatory Visit (INDEPENDENT_AMBULATORY_CARE_PROVIDER_SITE_OTHER): Payer: Medicare Other | Admitting: Pulmonary Disease

## 2010-10-10 DIAGNOSIS — G4733 Obstructive sleep apnea (adult) (pediatric): Secondary | ICD-10-CM

## 2010-10-10 NOTE — Assessment & Plan Note (Signed)
The pt has mild osa by her home sleep testing, but may be more severe if she did not truly sleep for 10-11 hrs.  Treatment options can include weight loss if applicable, surgery, dental appliance,and cpap.  Clinical correlation suggested.

## 2010-10-10 NOTE — Progress Notes (Signed)
The pt underwent home sleep testing with a type 3 monitoring device.  Airflow, effort, oxygen saturations, and heart rate were all monitored.  Her flow and saturation evaluation period was between 10-11 hrs.  The pt's data and tracings have been reviewed with the following findings:  1) 44 obstructive apneas, 40 central apneas, and 63 obstructive hypopneas were noted.  AHI 14/hr based on her 10-11hr evaluation period.  Given the long evaluation time, clinical correlation with pt is suggested for verification. 2) desaturation to 85% noted, with only 5 min spent less than or equal to 88% 3) no significant tachy or bradycardia noted.

## 2010-10-11 ENCOUNTER — Telehealth: Payer: Self-pay | Admitting: Pulmonary Disease

## 2010-10-11 NOTE — Telephone Encounter (Signed)
Pt states she spoke w/ rhonda and states this has been taking care of. Nothing further was needed

## 2010-10-16 ENCOUNTER — Telehealth: Payer: Self-pay | Admitting: Internal Medicine

## 2010-10-16 ENCOUNTER — Telehealth: Payer: Self-pay | Admitting: Cardiovascular Disease

## 2010-10-16 NOTE — Telephone Encounter (Signed)
Pt calling re sleep apnea test results

## 2010-10-16 NOTE — Telephone Encounter (Signed)
Called pt and reviewed results of home sleep study.  Pt is aware that Dr Graciela Husbands needs to review.

## 2010-10-16 NOTE — Telephone Encounter (Signed)
HAD SLEEP NAPEA  TEST DONE LAST Monday NIGHT SHE IS WANTING TO KNOW IF DR. NAHSER HAS RESULTS AND IS ANYONE GOING TO CALL HER WITH THE RESULTS

## 2010-10-16 NOTE — Telephone Encounter (Signed)
Sleep study ordered by dr cline ep, number given so pt could call for results. Alfonso Ramus RN

## 2010-10-17 ENCOUNTER — Other Ambulatory Visit (HOSPITAL_COMMUNITY): Payer: Medicare Other | Admitting: Radiology

## 2010-10-20 ENCOUNTER — Other Ambulatory Visit (INDEPENDENT_AMBULATORY_CARE_PROVIDER_SITE_OTHER): Payer: Medicare Other | Admitting: *Deleted

## 2010-10-20 DIAGNOSIS — I517 Cardiomegaly: Secondary | ICD-10-CM

## 2010-10-20 LAB — BASIC METABOLIC PANEL
BUN: 19 mg/dL (ref 6–23)
CO2: 28 mEq/L (ref 19–32)
Calcium: 9 mg/dL (ref 8.4–10.5)
Chloride: 104 mEq/L (ref 96–112)
Creatinine, Ser: 0.8 mg/dL (ref 0.4–1.2)
GFR: 77.8 mL/min (ref >60.00)
Glucose, Bld: 97 mg/dL (ref 70–99)
Potassium: 4 mEq/L (ref 3.5–5.1)
Sodium: 139 mEq/L (ref 135–145)

## 2010-10-23 NOTE — Telephone Encounter (Signed)
Will forward message to Geroge Baseman to see if she has sleep study results (not in [place], [employer], [employer]). There is a note in the pt's chart that she has been evaluated by Dr Shelle Iron on 10/10/10 for OSA. Per note Pam RN had reviewed preliminary with pt. The pt also had labs drawn 10/20/10 and these have not been reviewed by Dr Graciela Husbands at this time.

## 2010-10-23 NOTE — Telephone Encounter (Signed)
Pt calling back wanting results for sleep study. Also for lab results.

## 2010-10-24 NOTE — Assessment & Plan Note (Signed)
The patient has significant daytime somnolence and has obstructive breathing at night. She is admitted to awaken in herself with snoring. With her cardiomyopathy we will refer her for sleep testing.

## 2010-10-25 NOTE — Telephone Encounter (Signed)
I called and spoke with the pt. I explained that Dr. Graciela Husbands has sent Dr. Shelle Iron a message questioning if we need to set the patient up for a more formal sleep study. The patient feels like this would be a good idea because she states she did have a restless night while doing the home study. She feels like she did not sleep the entire 10-11 hours, but did get up quite frequently. I explained I will call her back early next week and let her know if we will be doing a more formal sleep study. She is agreeable. Sherri Rad, RN, BSN

## 2010-11-02 ENCOUNTER — Encounter: Payer: Self-pay | Admitting: Pulmonary Disease

## 2010-11-03 NOTE — Op Note (Signed)
NAME:  Samantha [name], CHALK                ACCOUNT NO.:  [id number]   MEDICAL RECORD NO.:  [id number]          PATIENT TYPE:  AMB   LOCATION:  DSC                          FACILITY:  [facility]   PHYSICIAN:  Adolph Pollack, M.D.DATE OF BIRTH:  1940-09-28   DATE OF PROCEDURE:  09/28/2005  DATE OF DISCHARGE:                                 OPERATIVE REPORT   PREOPERATIVE DIAGNOSIS:  Anal cancer.   POSTOPERATIVE DIAGNOSIS:  Anal cancer.   PROCEDURE:  Port-A-Cath insertion with fluoroscopic guidance.   SURGEON:  Adolph Pollack, M.D.   ANESTHESIA:  Local (1% lidocaine) with MAC.   INDICATIONS:  This 70 year old female has been recently diagnosed with anal  cancer and required chemotherapy and radiation.  She needs long-term venous  access and thus presents for Port-A-Cath insertion. We have discussed the  procedure and risks including but not limited to bleeding, infection,  catheter malfunction, DVT, and pneumothorax preoperatively.   TECHNIQUE:  She was seen in the holding area then brought to the operating  room, placed supine on the operating table and a roll was placed on her  back.  She was then given intravenous sedation and the neck and upper chest  wall were sterilely prepped and draped.  Local anesthetic was infiltrated in  the left infraclavicular region. An 18 gauge needle was used to cannulate  the left subclavian vein.  A wire was passed into the superior vena cava  under fluoroscopic guidance.  The needle was removed.  Inferior to this,  more local anesthetic was infiltrated in the chest wall superficially and  deep and an incision was made in the chest wall to accommodate the port.  The subcutaneous tissue was dissected down to the pectoralis major muscle  fascia.  A pocket was created for the port.   An incision was made around the wire.  A tunnel was created and the catheter  threaded through the inferior incision up to the superior incision.  The  vein was then  dilated with a dilator and then the dilator introducer complex  was placed into the vein.  The wire and introducer were removed and the  catheter was threaded through the peel-away sheath introducer.  The peel-  away sheath was then removed.  Under fluoroscopic guidance, the catheter tip  was pulled back into the distal superior vena cava.  The catheter was then  cut and threaded onto the port.  The port was then anchored to the chest  wall with interrupted 2-0 Vicryl sutures.  Blood was able to be aspirated  and saline was able to be flushed.  Concentrated heparin flush solution was  then placed in the port.   Final fluoroscopic visualization demonstrated the catheter and the port to  be in satisfactory position.   Hemostasis was adequate.  The subcutaneous tissue was closed over the port  with running 2-0 Vicryl suture.  The skin on both incisions was closed with  a 4-0 Monocryl subcuticular stitch.  Steri-Strips and sterile dressing were  applied.   She tolerated the procedure well without any apparent complications and was  taken to the recovery room in satisfactory condition where a portable chest  x-ray is pending.   She tolerated procedure without apparent complications and was taken to  recovery in satisfactory condition were a portal chest x-ray is pending.      Adolph Pollack, M.D.  Electronically Signed     TJR/MEDQ  D:  09/28/2005  T:  09/28/2005  Job:  [id number]

## 2010-11-03 NOTE — Op Note (Signed)
NAME:  Samantha [name], Samantha [name]                ACCOUNT NO.:  [id number]   MEDICAL RECORD NO.:  [id number]          PATIENT TYPE:  AMB   LOCATION:  DAY                          FACILITY:  [facility]   PHYSICIAN:  Adolph Pollack, M.D.DATE OF BIRTH:  Sep 29, 1940   DATE OF PROCEDURE:  08/31/2005  DATE OF DISCHARGE:                                 OPERATIVE REPORT   PREOPERATIVE DIAGNOSIS:  Anal intraepithelial neoplasia (AIN) type 3.   POSTOPERATIVE DIAGNOSIS:  Anal intraepithelial neoplasia (AIN) type 3.   PROCEDURE:  Transanal excision of anorectal mass (AIN).   SURGEON:  Adolph Pollack, M.D.   ANESTHESIA:  General.   INDICATIONS:  This 70 year old female was undergoing a routine colonoscopy  when she was found to have lesions at the anal mucosa near the dentate line.  This was posterior and biopsied and was AIN type 3. Residual mass was left  behind and she now presents for excision.   TECHNIQUE:  She was seen in the holding area and then brought to the  operating room, placed supine on the operating table and a general  anesthetic was administered. She was placed in the lithotomy position. The  perianal area was sterilely prepped and draped. Digital rectal exam  confirmed the posterior masses. A large diameter proctoscope was used to  expose the area posteriorly. 0.5% Marcaine with epinephrine was then  injected submucosally. I then marked the area of excision with  electrocautery approximately 0.5 to 1 cm away from the actual mass. I then  used the harmonic scalpel to excise the mass and sent it to pathology.  Bleeding was controlled with electrocautery and more of a perianal block was  performed with the Marcaine with epinephrine. At this point, hemostasis was  adequate. A piece of Gelfoam was placed over the biopsy bed. Instruments  were removed. A bulky dressing was applied.   She tolerated the procedure well without any apparent complications. She  subsequently was taken to the  recovery room in satisfactory condition. She  will be given discharge instructions including a liquid diet for 48 hours  and told to take a stool softener twice a day. Follow-up with me in the  office in 1-2 weeks.      Adolph Pollack, M.D.  Electronically Signed     TJR/MEDQ  D:  08/31/2005  T:  09/03/2005  Job:  [id number]   cc:   Anselmo Rod, M.D.  Fax: [phone]   Gaspar Garbe, M.D.  Fax: [phone]

## 2010-11-03 NOTE — Discharge Summary (Signed)
NAME:  Samantha [name], PICHE                ACCOUNT NO.:  [id number]   MEDICAL RECORD NO.:  [id number]          PATIENT TYPE:  INP   LOCATION:  1321                         [facility]ACILITY:  [facility]   PHYSICIAN:  Lauretta I. Odogwu, M.D.DATE OF BIRTH:  Jun 05, 1941   DATE OF ADMISSION:  11/22/2005  DATE OF DISCHARGE:  11/23/2005                                 DISCHARGE SUMMARY   DISCHARGE DIAGNOSES:  1.  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  2.  Febrile neutropenia - resolved.  3.  Anemia - resolved.   DISCHARGE MEDICATIONS:  1.  Levaquin 500 mg once daily for 7 days.  2.  Potassium chloride 40 mEq once daily for 3 days.  3.  MSIR 5 mg 1-2 p.o. q.4h p.r.n. for pain.  4.  Hydrogel.  5.  Patient instructed to restart all other home medications.   CONSULTATIONS:  None.   CONDITION ON DISCHARGE  Stable.   PROCEDURES:  CBC dated November 23, 2005:  WBC 2.2, hemoglobin 10.2, hematocrit  29.1, platelets are 69.  ANC is 1.8.  Routine chemistry panel completed on  November 23, 2005:  Sodium 140, potassium 3.7, chloride 107, CO2 25, glucose is  98, BUN is 5, creatinine 0.6, calcium is 8.2, total protein 5.4, albumin  2.7.  AST is 23, ALT is 25, alkaline phosphatase is 59, total bilirubin is  0.6.  Urinalysis completed on November 22, 2005:  Color yellow, clear, specific  gravity is 1.016, pH is 6, hemoglobin is large, leukocyte esterase is  moderate, WBCs at 7-10, RBCs are 21-50.  Urine culture dated November 23, 2005  with 7000 colonies per ml of yeast.   CONDITION ON DISCHARGE:  Stable.   BRIEF ADMISSION HISTORY AND PHYSICAL:  [name] is a 70 year old woman  with a history of a stage I invasive moderately differentiated squamous cell  carcinoma of the anal canal diagnosed. A trans-anal excision was completed  on August 31, 2005 with pathology showing a 0.8  cm moderately differentiated  invasive squamous cell carcinoma. She was treated with 28 fractions of  radiation therapy completed on November 22, 2005, with mitomycin and 5-  fluorouracil dosed on October 08, 2005 and Nov 13, 2005.  She was seen in  radiation oncology on November 21, 2005 and noted to be febrile, with a fever of  101.4.  She was referred to the regional cancer center where a CBC was  completed, with a white blood cell count of 1.4 and ANC of 1.1 along with a  hemoglobin of 8.4, hematocrit of 24.1.  Additionally, the patient was seen  to be hypokalemic with a potassium of 3.1.  She was given IV fluids, Tylenol  650 mg  p.o. and dosed with Rocephin 2 grams IV.  Additionally, blood  cultures were collected x2.  The patient was admitted to [facility] for transfusion and dosing with antibiotics.   PHYSICAL EXAMINATION:  The  patient is an adult female who appeared to be in  no acute distress.  Vital signs:  Temperature was 101.4, pulse was 102,  blood pressure was 110/64.  HEENT EXAM:  No lesions, mucous membranes are moist without thrush.  NECK:  Supple.  LYMPH NODES:  Showed no lymphadenopathy in the cervical, supraclavicular,  axillary, epitrochlea or inguinal areas.  CARDIOVASCULAR:  Revealed small tachycardia with normal S1, S2.  A port is  noted in the left chest wall with no erythema or exudate.  LUNGS:  Clear to auscultation without wheezes, rubs or rhonchi.  ABDOMEN:  Soft, nontender, nondistended with positive bowel sounds.  EXTREMITIES:  Warm with no clubbing, cyanosis or edema.   HOSPITAL COURSE:  As previously stated, the patient was admitted to [facility] November 22, 2005 and initiated on IV antibiotics as well as  Neupogen.  She was seen on the morning of November 23, 2005 and felt  significantly better following dosing with  Ceftin, Neupogen and 2 units of  packed red blood cells.  She was also noted to be afebrile.  Based on this  she was seen,  dosed with her second dose of Neupogen and discharged home.   DISCHARGE INSTRUCTIONS:  The patient was to contact Dr. Arlan Organ for  an appointment for November 28, 2005 at 2:30 p.m..   SUMMARY:      Samantha [name]. Tanner, PA      Lauretta I. Odogwu, M.D.  Electronically Signed    VET/MEDQ  D:  11/27/2005  T:  11/27/2005  Job:  [id number]

## 2010-11-03 NOTE — Op Note (Signed)
NAME:  Samantha [name], Samantha [name]                ACCOUNT NO.:  [id number]   MEDICAL RECORD NO.:  [id number]          PATIENT TYPE:  AMB   LOCATION:  DSC                          FACILITY:  [facility]   PHYSICIAN:  Katy Fitch. Sypher Montez Hageman., M.D.DATE OF BIRTH:  1941/01/11   DATE OF PROCEDURE:  07/21/2004  DATE OF DISCHARGE:                                 OPERATIVE REPORT   PREOPERATIVE DIAGNOSIS:  Entrapment neuropathy of median nerve, right carpal  tunnel.   POSTOPERATIVE DIAGNOSIS:  Entrapment neuropathy of median nerve, right  carpal tunnel.   OPERATION:  Release of right transcarpal ligament.   OPERATIONS:  Katy Fitch. Sypher, M.D.   ASSISTANT:  Jonni Sanger, P.A.   ANESTHESIA:  General by LMA.   SUPERVISING ANESTHESIOLOGIST:  Germaine Pomfret, M.D.   INDICATIONS:  [name] is a 70 year old woman referred for evaluation  and management of a numb right hand. Clinical examination revealed signs of  median entrapment neuropathy at the level of the carpal tunnel.  Electrodiagnostic studies confirmed median neuropathy.   Due to failure to respond to nonoperative measures, she is brought to the  operative this time for release of her right transcarpal ligament.   PROCEDURE:  [name] was brought to the operating room and placed in  supine position upon the operating table. Following the induction general  anesthesia by LMA, the right arm was prepped with Betadine soaping solution  and sterilely draped. Following exsanguination of the limb, an arterial  tourniquet was inflated to 220 mmHg. Procedure commenced with a short  incision in the line of the ring finger and the palm. Subcutaneous tissues  were carefully divided, revealing the palmar fascia. This was split  longitudinally to reveal the common extensor branch of the median nerve.   These were followed back to the transcarpal ligament, which were carefully  isolated from the median nerve. The ligaments was released  along its  ulnar  border, extending into the distal forearm.   This widely opened the carpal canal.   Bleeding points along the margin of the released ligament were  electrocauterized with bipolar current followed by repair the skin with  intradermal 3-0 Prolene suture.   A compressive dressing was applied with a volar plaster splint, maintaining  the wrist in 5 degrees of dorsiflexion.      RVS/MEDQ  D:  07/21/2004  T:  07/21/2004  Job:  [id number]

## 2010-11-03 NOTE — Op Note (Signed)
NAME:  Samantha [name], Samantha [name]                ACCOUNT NO.:  [id number]   MEDICAL RECORD NO.:  [id number]          PATIENT TYPE:  AMB   LOCATION:  DSC                          FACILITY:  [facility]   PHYSICIAN:  Adolph Pollack, M.D.DATE OF BIRTH:  1941/01/26   DATE OF PROCEDURE:  06/28/2006  DATE OF DISCHARGE:                               OPERATIVE REPORT   PREOPERATIVE DIAGNOSIS:  Retained Port-A-Cath.   POSTOPERATIVE DIAGNOSIS:  Retained Port-A-Cath.   PROCEDURE:  Port-A-Cath removal.   SURGEON:  Adolph Pollack, M.D.   ANESTHESIA:  MAC with local (1% lidocaine with epinephrine plus 0.5%  plain Marcaine plus sodium bicarbonate).   INDICATIONS:  This is a 70 year old female with anal cancer who  underwent chemotherapy.  The cancer has been adequately treated and now  she presents for removal of her Port-A-Cath.  It is in the left upper  chest wall.   TECHNIQUE:  She was seen in the holding area and brought to the  operating room and placed supine on the operating table, given  intravenous sedation.  The left upper chest wall was sterilely prepped  and draped.  Local anesthetic was infiltrated in the previous incision  on the chest wall and the previous incision was reincised through skin,  subcutaneous tissue, until the port was identified.  I loosened the  catheter and removed it from the vein and held direct pressure in the  infraclavicular region.  While doing this, the catheter was freed up  from its fibrous attachments and the port was freed up from its fibrous  attachments and removed.  Bleeding from the ports sites was controlled  with electrocautery.   Once hemostasis was adequate, the port site wound was closed in two  layers.  The first layer of subcutaneous tissue was closed with a  running 3-0 Vicryl suture.  The skin was closed with a 4-0 Monocryl  subcuticular stitch.  Steri-Strips and a sterile dressing were applied.  She tolerated the procedure well without any  apparent complications and  was taken to the recovery room in satisfactory condition.      Adolph Pollack, M.D.  Electronically Signed     TJR/MEDQ  D:  06/28/2006  T:  06/28/2006  Job:  [id number]

## 2010-11-03 NOTE — Procedures (Signed)
[place]  Patient:    Samantha [name]                        MRN: [id number] Proc. Date: 10/03/99 Attending:  Charna Elizabeth Dictator:   Anselmo Rod, M.D. CC:         Nadyne Coombes. Fontaine, M.D.             Adolph Pollack, M.D.                           Procedure Report  DATE OF BIRTH:  07-Feb-2041  REFERRING PHYSICIAN:  Timothy P. Fontaine, M.D.  PROCEDURE PERFORMED:  Colonoscopy.  ENDOSCOPIST:  Anselmo Rod, M.D.  INSTRUMENT USED:  Olympus video colonoscope.  INDICATIONS FOR PROCEDURE:  Rectal bleeding in a 70 year old white female, rule out masses, polyps, hemorrhoids, etc.  PRE-PROCEDURE PREPARATION:  Informed consent was obtained from the patient. The patient was fasted for eight hours prior to the procedure and prepped with a bottle of magnesium citrate and a gallon of nylytely the night prior to the procedure.  PRE-PROCEDURE PHYSICAL:  VITAL SIGNS:  Stable.  NECK:  Supple.  CHEST:  Clear to auscultation.  S1 and S2 regular.  ABDOMEN:  Soft with normal abdominal bowel sounds.  DESCRIPTION OF PROCEDURE:  The patient was placed in the left lateral decubitus  position and sedated with 60 mg of Demerol and 5 mg of Versed intravenously. Once the patient was adequately sedated and maintained on low flow oxygen and continuous cardiac monitoring, the Olympus video colonoscope was advanced from the rectum o the cecum with difficulty secondary to a large amount of residue still in the colon, however, no masses, polyps, erosions, or ulcerations were seen.  The procedure was completed up to the cecum.  The appendiceal orifice and the IC valve were clearly visualized.  There was prominent external hemorrhoids and small internal hemorrhoids.  The patient tolerated the procedure well without complications.  IMPRESSION: 1. Prominent external hemorrhoids which are possibly the patients source of recurrent bleeding. 2. Small  non-bleeding internal hemorrhoids. 3. Large amount of residue in the colon, no masses or polyps seen.  RECOMMENDATIONS: 1. The patient has been advised to increase the fluid and fiber in her diet.  A  high fiber diet has been discussed with her in great detail, and a fiber chart as been given to her for education. 2. If the rectal bleeding persists in spite of soft stools, then surgical evaluation is recommended. DD:  10/03/99 TD:  10/04/99 Job: [id number]

## 2010-11-03 NOTE — Op Note (Signed)
NAME:  Samantha [name], Samantha [name]                ACCOUNT NO.:  [id number]   MEDICAL RECORD NO.:  [id number]          PATIENT TYPE:  AMB   LOCATION:  NESC                         FACILITY:  [facility]   PHYSICIAN:  Adolph Pollack, M.D.DATE OF BIRTH:  07-16-1940   DATE OF PROCEDURE:  02/07/2006  DATE OF DISCHARGE:                                 OPERATIVE REPORT   PREOPERATIVE DIAGNOSIS:  Anal cancer.   POSTOPERATIVE DIAGNOSIS:  Anal cancer.   PROCEDURE:  Examination under anesthesia, anoscopy, anal biopsy.   SURGEON:  Adolph Pollack, M.D.   ANESTHESIA:  General.   INDICATIONS:  This is a 70 year old female diagnosed with anal cancer.  She  underwent chemo-radiation.  She has completed that and now is brought to the  operating room for exam under anesthesia and anal biopsies of the area.  We  discussed the procedure and risks preoperatively.   TECHNIQUE:  She was seen in the holding area, then brought to the operating  room and placed on the operating table, and a general anesthetic was  administered.  She was then placed in a lithotomy position.  The perianal  area was sterilely prepped and draped.  Digital rectal exam was performed  and a firm area posteriorly was noted.  Anoscopy was performed in the  posterior region.  I then took three biopsies sharply from the posterior  region where the original tumor was and sent these to pathology.  The  bleeding was controlled with electrocautery.  I performed a partial anal  block with 0.5% Marcaine with epinephrine and held pressure in this area.   When hemostasis was adequate, a piece of Gelfoam was placed around the  biopsy sites and a bulky dressing was applied.   She tolerated the procedure well without any apparent complications and was  taken to recovery in satisfactory condition.      Adolph Pollack, M.D.  Electronically Signed     TJR/MEDQ  D:  02/07/2006  T:  02/07/2006  Job:  [id number]   cc:   Anselmo Rod, M.D.  Fax:  [phone]   Lauretta I. Odogwu, M.D.  Fax: [phone]

## 2010-11-03 NOTE — Op Note (Signed)
[facility]  Patient:    Samantha [name], Samantha [name] Visit Number: [id number] MRN: [id number]          Service Type: SUR Location: 3E 4782 01 Attending Physician:  Arlis Porta Proc. Date: 02/04/01 Adm. Date:  02/04/2001   CC:         Anselmo Rod, M.D.  Fritzi Mandes, M.D.   Operative Report  PREOPERATIVE DIAGNOSIS:  Polypoid gallbladder mass with cholelithiasis.  POSTOPERATIVE DIAGNOSIS:  Polypoid gallbladder mass with cholelithiasis and chronic calculus cholecystitis.  PROCEDURE:  Laparoscopic cholecystectomy with intraoperative cholangiogram.  SURGEON:  Adolph Pollack, M.D.  ASSISTANT:  Sheppard Plumber. Earlene Plater, M.D.  ANESTHESIA:  General.  INDICATIONS:  Ms. Taha is a 70 year old female, who had severe acute epigastric pain following a meal and presented to the emergency department in early July.  She was noted to have mild elevation of her AST and ALT on her liver function tests and a glucose level.  Dr. Loreta Ave saw her, and an ultrasound was ordered which demonstrated cholelithiasis as well as a soft tissue like polypoid lesion seen within the gallbladder.  Because of that, she now presents for elective cholecystectomy.  The procedure and risks were explained to her preoperatively.  TECHNIQUE:  She was placed supine on the operating table, and a general anesthetic was administered.  The abdomen was sterilely prepped and draped. Because she had a previous lower midline incision, the supraumbilical area was approached, anesthetized with Marcaine local anesthetic, and a small incision made in the supraumbilical region and carried down through the subcutaneous tissue.  A 1-1.5 cm incision was made in the midline fascia, and the peritoneal cavity was entered bluntly and under direct vision.  A pursestring suture of 0 Vicryl was placed around the fascial edges, and the Hasson trocar was introduced to the peritoneal cavity.  A  pneumoperitoneum was created by insufflation of C02 gas.  Next, the laparoscope was introduced, and I examined the abdominal cavity.   There were no significant adhesions between the lower abdominal wall and the omentum.  There was one adhesion from the cecum to the anterior abdominal wall.  The liver appeared to be normal as did the anterior surface of the stomach.  The gallbladder appeared to be somewhat enlarged.  Under direct vision, and epigastric incision was made and 10 mm trocar was placed through this.  Two 5 mm trocars were placed in the right abdomen.  The patient was placed in the appropriate position, and the fundus of the gallbladder was grasped and retracted toward the right shoulder.  I then mobilized the infundibulum using blunt dissection and the cautery.  I could see the common bile duct which appeared to be slightly dilated to me.  The cystic artery was adherent to the cystic duct, and I separated these, clipped the cystic artery, and divided it.  I then skeletonized the cystic duct up toward the neck of the gallbladder and placed a clip here.  I made an incision in the cystic duct and passed a Cholangiocath through the anterior abdominal wall, into the cystic duct, and performed a cholangiogram.  Under real-time fluoroscopy, dilute contrast solution was injected into the cystic duct.  It emptied into the common bile duct which tapered down in a normal-type fashion and then drained into the duodenum.  However, the cystic duct, common bile duct, and hepatic duct all looked mildly dilated.  The final interpretation is pending the radiologists reading.  Since I did not see any  obstructing-type lesions of the common bile duct, I removed the Cholangiocath and then clipped the cystic duct three times proximally.  I divided it.  I noted some of the clips did not close completely, so I removed the middle clip and then replaced with a chromic Endoloop to occlude the cystic  duct.  Next, I removed the gallbladder from the liver bed using the cautery.  A small puncture wound was made in the gallbladder, but no stones spilled.  Once the gallbladder was removed, it was placed into an Endopouch bag.  I then irrigated out the gallbladder fossa and controlled bleeding with select electrocautery.  I once again irrigated and did not notice any further bleeding or bile leakage.  The gallbladder was then removed through the supraumbilical incision, and the supraumbilical fascial defect was closed by tightening up and tying down the pursestring suture.  The patient was placed in the neutral supine position and the irrigation fluid evacuated.  All trocars were removed and the pneumoperitoneum was released.  She tolerated the procedure well without any apparent complications and was taken to the recovery room in satisfactory condition.  We will wait for the postoperative pathologic report. Attending Physician:  Arlis Porta DD:  02/04/01 TD:  02/04/01 Job: [id number]

## 2010-11-14 ENCOUNTER — Encounter: Payer: Self-pay | Admitting: Internal Medicine

## 2010-11-27 ENCOUNTER — Ambulatory Visit (INDEPENDENT_AMBULATORY_CARE_PROVIDER_SITE_OTHER): Payer: Medicare Other | Admitting: Nurse Practitioner

## 2010-11-27 ENCOUNTER — Inpatient Hospital Stay: Admit: 2010-11-27 | Payer: Self-pay | Admitting: Cardiovascular Disease

## 2010-11-27 ENCOUNTER — Inpatient Hospital Stay (HOSPITAL_COMMUNITY)
Admission: EM | Admit: 2010-11-27 | Discharge: 2010-11-28 | DRG: 287 | Disposition: A | Discharge status: Home / Self Care | Payer: Medicare Other | Attending: Cardiovascular Disease | Admitting: Cardiovascular Disease

## 2010-11-27 ENCOUNTER — Encounter: Payer: Self-pay | Admitting: Nurse Practitioner

## 2010-11-27 VITALS — BP 78/38 | HR 64 | Wt 173.2 lb

## 2010-11-27 DIAGNOSIS — R9431 Abnormal electrocardiogram [ECG] [EKG]: Secondary | ICD-10-CM

## 2010-11-27 DIAGNOSIS — H109 Unspecified conjunctivitis: Secondary | ICD-10-CM | POA: Diagnosis present

## 2010-11-27 DIAGNOSIS — I428 Other cardiomyopathies: Secondary | ICD-10-CM | POA: Diagnosis present

## 2010-11-27 DIAGNOSIS — Z85038 Personal history of other malignant neoplasm of large intestine: Secondary | ICD-10-CM

## 2010-11-27 DIAGNOSIS — I447 Left bundle-branch block, unspecified: Secondary | ICD-10-CM

## 2010-11-27 DIAGNOSIS — K121 Other forms of stomatitis: Secondary | ICD-10-CM | POA: Diagnosis present

## 2010-11-27 DIAGNOSIS — I959 Hypotension, unspecified: Principal | ICD-10-CM | POA: Diagnosis present

## 2010-11-27 DIAGNOSIS — R5381 Other malaise: Secondary | ICD-10-CM

## 2010-11-27 DIAGNOSIS — R5383 Other fatigue: Secondary | ICD-10-CM

## 2010-11-27 DIAGNOSIS — R0602 Shortness of breath: Secondary | ICD-10-CM

## 2010-11-27 LAB — CBC WITH DIFFERENTIAL/PLATELET
Basophils Absolute: 0 10*3/uL (ref 0.0–0.1)
Basophils Relative: 0.5 % (ref 0.0–3.0)
Eosinophils Absolute: 0.1 10*3/uL (ref 0.0–0.7)
Eosinophils Relative: 2.3 % (ref 0.0–5.0)
HCT: 33.7 % — ABNORMAL LOW (ref 36.0–46.0)
Hemoglobin: 11.6 g/dL — ABNORMAL LOW (ref 12.0–15.0)
Lymphocytes Relative: 21.8 % (ref 12.0–46.0)
Lymphs Abs: 1.4 10*3/uL (ref 0.7–4.0)
MCHC: 34.6 g/dL (ref 30.0–36.0)
MCV: 95.8 fl (ref 78.0–100.0)
Monocytes Absolute: 0.9 10*3/uL (ref 0.1–1.0)
Monocytes Relative: 15 % — ABNORMAL HIGH (ref 3.0–12.0)
Neutro Abs: 3.8 10*3/uL (ref 1.4–7.7)
Neutrophils Relative %: 60.4 % (ref 43.0–77.0)
Platelets: 195 10*3/uL (ref 150.0–400.0)
RBC: 3.52 Mil/uL — ABNORMAL LOW (ref 3.87–5.11)
RDW: 13.4 % (ref 11.5–14.6)
WBC: 6.3 10*3/uL (ref 4.5–10.5)

## 2010-11-27 LAB — CARDIAC PANEL(CRET KIN+CKTOT+MB+TROPI)
CK, MB: 1.4 ng/mL (ref 0.3–4.0)
Relative Index: 1.1 (ref 0.0–2.5)
Total CK: 127 U/L (ref 7–177)
Troponin I: <0.3 ng/mL (ref <0.30)

## 2010-11-27 LAB — CBC
HCT: 35.1 % — ABNORMAL LOW (ref 36.0–46.0)
Hemoglobin: 11.8 g/dL — ABNORMAL LOW (ref 12.0–15.0)
MCH: 31.7 pg (ref 26.0–34.0)
MCHC: 33.6 g/dL (ref 30.0–36.0)
MCV: 94.4 fL (ref 78.0–100.0)
Platelets: 208 10*3/uL (ref 150–400)
RBC: 3.72 MIL/uL — ABNORMAL LOW (ref 3.87–5.11)
RDW: 12.8 % (ref 11.5–15.5)
WBC: 6.5 10*3/uL (ref 4.0–10.5)

## 2010-11-27 LAB — COMPREHENSIVE METABOLIC PANEL
ALT: 23 U/L (ref 0–35)
AST: 27 U/L (ref 0–37)
Albumin: 3.7 g/dL (ref 3.5–5.2)
Alkaline Phosphatase: 62 U/L (ref 39–117)
BUN: 23 mg/dL (ref 6–23)
CO2: 25 mEq/L (ref 19–32)
Calcium: 9.6 mg/dL (ref 8.4–10.5)
Chloride: 101 mEq/L (ref 96–112)
Creatinine, Ser: 0.94 mg/dL (ref 0.4–1.2)
GFR calc Af Amer: >60 mL/min (ref >60)
GFR calc non Af Amer: 59 mL/min — ABNORMAL LOW (ref >60)
Glucose, Bld: 100 mg/dL — ABNORMAL HIGH (ref 70–99)
Potassium: 4.6 mEq/L (ref 3.5–5.1)
Sodium: 139 mEq/L (ref 135–145)
Total Bilirubin: 0.4 mg/dL (ref 0.3–1.2)
Total Protein: 7.3 g/dL (ref 6.0–8.3)

## 2010-11-27 LAB — BASIC METABOLIC PANEL
BUN: 26 mg/dL — ABNORMAL HIGH (ref 6–23)
CO2: 25 mEq/L (ref 19–32)
Calcium: 9.1 mg/dL (ref 8.4–10.5)
Chloride: 103 mEq/L (ref 96–112)
Creatinine, Ser: 1.2 mg/dL (ref 0.4–1.2)
GFR: 49.69 mL/min — ABNORMAL LOW (ref >60.00)
Glucose, Bld: 88 mg/dL (ref 70–99)
Potassium: 4.6 mEq/L (ref 3.5–5.1)
Sodium: 136 mEq/L (ref 135–145)

## 2010-11-27 LAB — MRSA PCR SCREENING: MRSA by PCR: NEGATIVE

## 2010-11-27 LAB — PRO B NATRIURETIC PEPTIDE: Pro B Natriuretic peptide (BNP): 179.8 pg/mL — ABNORMAL HIGH (ref 0–125)

## 2010-11-27 LAB — TSH
TSH: 1.61 u[IU]/mL (ref 0.35–5.50)
TSH: 1.872 u[IU]/mL (ref 0.350–4.500)

## 2010-11-27 LAB — CK TOTAL AND CKMB (NOT AT ARMC)
CK, MB: 1.3 ng/mL (ref 0.3–4.0)
Relative Index: INVALID (ref 0.0–2.5)
Total CK: 81 U/L (ref 7–177)

## 2010-11-27 LAB — DIFFERENTIAL
Basophils Absolute: 0 10*3/uL (ref 0.0–0.1)
Basophils Relative: 0 % (ref 0–1)
Eosinophils Absolute: 0.2 10*3/uL (ref 0.0–0.7)
Eosinophils Relative: 3 % (ref 0–5)
Lymphocytes Relative: 30 % (ref 12–46)
Lymphs Abs: 2 10*3/uL (ref 0.7–4.0)
Monocytes Absolute: 1 10*3/uL (ref 0.1–1.0)
Monocytes Relative: 16 % — ABNORMAL HIGH (ref 3–12)
Neutro Abs: 3.4 10*3/uL (ref 1.7–7.7)
Neutrophils Relative %: 51 % (ref 43–77)

## 2010-11-27 LAB — PROTIME-INR
INR: 1.07 (ref 0.00–1.49)
Prothrombin Time: 14.1 seconds (ref 11.6–15.2)

## 2010-11-27 LAB — BRAIN NATRIURETIC PEPTIDE: Pro B Natriuretic peptide (BNP): 21 pg/mL (ref 0.0–100.0)

## 2010-11-27 LAB — TROPONIN I: Troponin I: <0.3 ng/mL (ref <0.30)

## 2010-11-27 NOTE — Assessment & Plan Note (Signed)
She is in sinus today. She has marked T wave inversion in anteriorly. Her last cath was in 2000. She is having a multitude of vague somatic complaints. I have discussed her care with Dr. Deborah Chalk and we will proceed with admission to the hospital with plans for cath tomorrow.

## 2010-11-27 NOTE — Assessment & Plan Note (Signed)
EF is 30 to 35% per echo in March. Labs will be checked to rule out other abnormalities.

## 2010-11-27 NOTE — Assessment & Plan Note (Signed)
No L BBB today.

## 2010-11-27 NOTE — Progress Notes (Signed)
Precious Gilding Date of Birth: 02-23-1941   History of Present Illness: Samantha [name] is seen today for a work in visit. She is seen for Dr. Elease Hashimoto. She has not felt well over this weekend. She has had some clammy spells. No chest pain is reported. She has had some mild shortness of breath. Appetite has been poor. She is very weak. Not eating well. She is not dizzy. She does not weigh daily or monitor her blood pressure at home. Blood pressure is down in the 70's here in the office. She appears presyncopal. She is clammy. EKG is abnormal. She has a history of a LBBB.  Current Outpatient Prescriptions on File Prior to Visit  Medication Sig Dispense Refill  . CALCIUM PO Take by mouth.        . carvedilol (COREG) 12.5 MG tablet Take 1 tablet (12.5 mg total) by mouth 2 (two) times daily.  60 tablet  11  . Cetirizine HCl (ZYRTEC PO) Take by mouth.        . Cholecalciferol (VITAMIN D3) 1000 UNITS CAPS Take 1 capsule by mouth.        . citalopram (CELEXA) 40 MG tablet 1 tab po qd/ pt not taking regularly       . fish oil-omega-3 fatty acids 1000 MG capsule Take 2 g by mouth daily.        Marland Kitchen ibuprofen (ADVIL,MOTRIN) 400 MG tablet Take 400 mg by mouth as needed.        Marland Kitchen spironolactone (ALDACTONE) 25 MG tablet Take 1 tablet (25 mg total) by mouth daily.  30 tablet  11  . valACYclovir (VALTREX) 500 MG tablet Take 500 mg by mouth 2 (two) times daily.        Marland Kitchen DISCONTD: losartan (COZAAR) 50 MG tablet 100 mg daily. 1 tab po qd        Allergies  Allergen Reactions  . Codeine   . Iohexol      Desc: Pt. states she broke out in hives 30 yrs ago w/ a CT Brain scan.  She has since had a heart cath and this CT today (08/10/05) w/o premeds and did well. No evident reaction.  Thanks.     Past Medical History  Diagnosis Date  . Conjunctivitis   . Cancer of colon   . Febrile neutropenia   . Anemia   . Hypokalemia   . Mucositis   . LBBB (left bundle branch block)   . Nonischemic cardiomyopathy     EF 30 to  35%    Past Surgical History  Procedure Date  . Appendectomy   . Cesarean section   . Cholecystectomy   . Tonsillectomy   . Port-a-cath removal   . US echocardiography 3/12    EF 30 to 35%    History  Smoking status  . Never Smoker   Smokeless tobacco  . Not on file    History  Alcohol Use No    No family history on file.  Review of Systems: The review of systems is positive for clamminess and significant fatigue. No real chest pain. She has had some shortness of breath. Her EF is 30 to 35% per echo in March of this year. She has had her Coreg titrated up and was placed on Aldactone back in April by Dr. Graciela Husbands. There was discussion for a CRT device however she may need further MRI's because of her renal cancer. She has not been implanted with a CRT device. She has  been classified with NYHA Class III.  She is due to see oncology in July.  All other systems were reviewed and are negative.  Physical Exam: BP 78/38  Pulse 64  Wt 173 lb 4 oz (78.586 kg) Patient is pleasant and in mild acute distress. She has intermittent diaphoresis.  Skin is clammy. Color is normal to sallow.  HEENT is unremarkable. Normocephalic/atraumatic. PERRL. Sclera are nonicteric. Neck is supple. No masses. No JVD. Lungs are clear. Cardiac exam shows a regular rate and rhythm. ?S3. Abdomen is soft. Nontender. Extremities are without edema. Gait and ROM are intact. No gross neurologic deficits noted.   LABORATORY DATA:  EKG shows sinus today with anterior T wave inversion   Assessment / Plan:

## 2010-11-28 DIAGNOSIS — I959 Hypotension, unspecified: Secondary | ICD-10-CM

## 2010-11-28 LAB — LIPID PANEL
Cholesterol: 192 mg/dL (ref 0–200)
HDL: 59 mg/dL (ref >39)
LDL Cholesterol: 116 mg/dL — ABNORMAL HIGH (ref 0–99)
Total CHOL/HDL Ratio: 3.3 RATIO
Triglycerides: 83 mg/dL (ref <150)
VLDL: 17 mg/dL (ref 0–40)

## 2010-11-28 LAB — CBC
HCT: 33.4 % — ABNORMAL LOW (ref 36.0–46.0)
Hemoglobin: 11.3 g/dL — ABNORMAL LOW (ref 12.0–15.0)
MCH: 31.3 pg (ref 26.0–34.0)
MCHC: 33.8 g/dL (ref 30.0–36.0)
MCV: 92.5 fL (ref 78.0–100.0)
Platelets: 214 10*3/uL (ref 150–400)
RBC: 3.61 MIL/uL — ABNORMAL LOW (ref 3.87–5.11)
RDW: 12.9 % (ref 11.5–15.5)
WBC: 5.5 10*3/uL (ref 4.0–10.5)

## 2010-11-28 LAB — HEPARIN LEVEL (UNFRACTIONATED): Heparin Unfractionated: 0.48 IU/mL (ref 0.30–0.70)

## 2010-11-28 LAB — CARDIAC PANEL(CRET KIN+CKTOT+MB+TROPI)
CK, MB: 1.7 ng/mL (ref 0.3–4.0)
Relative Index: INVALID (ref 0.0–2.5)
Total CK: 87 U/L (ref 7–177)
Troponin I: <0.3 ng/mL (ref <0.30)

## 2010-11-28 LAB — GLUCOSE, CAPILLARY: Glucose-Capillary: 132 mg/dL — ABNORMAL HIGH (ref 70–99)

## 2010-11-29 NOTE — Cardiovascular Report (Signed)
  NAME:  Samantha [name], Samantha [name] NO.:  [id number]  MEDICAL RECORD NO.:  [id number]  LOCATION:  6522                         FACILITY:  [facility]  PHYSICIAN:  Philis Doke M. [place], M.D.  DATE OF BIRTH:  Nov 09, 1940  DATE OF PROCEDURE:  11/28/2010 DATE OF DISCHARGE:  11/28/2010                           CARDIAC CATHETERIZATION   INDICATIONS FOR PROCEDURE:  This is a 70 year old white female with history of nonischemic cardiomyopathy who presents with presyncope and hypotension associated with new T-wave inversions in the anterior leads.  PROCEDURE:  Left heart catheterization, coronary and left ventricular angiography.  OPERATOR:  Trinh Sanjose M. [place], MD  ACCESS:  Using the right radial artery using standard Seldinger technique.  EQUIPMENT:  5-French 4-cm right and left Judkins catheter, 5-French pigtail catheter, and 5-French arterial sheath.  MEDICATIONS:  Local anesthesia 1% Xylocaine, Versed 1 mg IV.  CONTRAST:  70 mL of Omnipaque. HEMODYNAMIC DATA:  Aortic pressure is 143/67 with a mean of 100 mmHg. Left ventricular pressure is 142 with an EDP of 9 mmHg.  ANGIOGRAPHIC DATA:  The right coronary arises and distributes normally. It is a dominant vessel.  It is normal throughout.  The left coronary artery arises and distributes normally.  The left main coronary artery is normal.  The left anterior descending artery is normal.  It gives rise to a moderate-sized diagonal branch which is normal.  The ramus intermediate branch is moderate-to-large vessel which is normal.  The left circumflex coronary artery gives rise to 2 marginal branches. It is normal throughout.  Left ventricular angiography was performed in the RAO view.  This demonstrates normal left ventricular chamber size with global hypokinesis and overall ejection fraction of 40%.  There is mild mitral insufficiency.  FINAL INTERPRETATION: 1. Normal coronary anatomy. 2. Moderate left ventricular  dysfunction.  PLAN:  We would recommend continued medical therapy.  Given her recent hypotension, we will plan on reducing her losartan dose.          ______________________________ Ralphael Southgate M. [place], M.D.     PMJ/MEDQ  D:  11/28/2010  T:  11/29/2010  Job:  [id number]  cc:   Duke Salvia, MD, [facility] Gaspar Garbe, M.D.  Electronically Signed by Josephus Harriger [place] M.D. on 11/29/2010 02:51:45 PM

## 2010-12-05 ENCOUNTER — Ambulatory Visit: Payer: Medicare Other | Admitting: Cardiovascular Disease

## 2010-12-07 NOTE — H&P (Signed)
Samantha [name], Samantha [name] NO.:  [id number]  MEDICAL RECORD NO.:  [id number]  LOCATION:  2918                         FACILITY:  [facility]  PHYSICIAN:  Vesta Mixer, M.D. DATE OF BIRTH:  17-Jan-1941  DATE OF ADMISSION:  11/27/2010 DATE OF DISCHARGE:                             HISTORY & PHYSICAL   CHIEF COMPLAINT:  Significant fatigue.  HISTORY OF PRESENT ILLNESS:  Samantha [name] is a very pleasant 70 year old female who has multiple medical issues.  She was actually due for her cardiology followup appointment next Monday.  However, over the course of this past weekend, she began feeling poorly.  She noted that her symptoms really started where she started feeling bad was this past Friday and persisted throughout until today.  She has tried to mow her grass, but was so weak that she had to go and lie down.  She is not really dizzy, but she does feel wobbly.  She notes that her appetite has been very poor and she is really not eating very much.  She is not nauseated and having no vomiting.  She may be a little bit more short of breath.  She has had a little bit of a cough.  She has felt somewhat clammy.  She denies syncope and she actually denies chest pain.  While she was here in the office, she did have several episodes where she became clammy and diaphoretic.  Her EKG was noted to be abnormal and she is subsequently admitted for further evaluation.  PAST MEDICAL HISTORY:  She has a longstanding history of nonischemic cardiomyopathy that was initially diagnosed back in 2009.  She has had her most recent 2-D echocardiogram in March 2012 showing persistent LV dysfunction with dyssynchrony and an ejection fraction of 30-35%.  She was evaluated by Dr. Graciela Husbands for possibility of CRT therapy, however, there is concern that she will need to continue to have routine MRIs for her cancer followup.  She currently does not have CRT in place.  She has had some difficulty with  up-titration of medicines.  She had remote cardiac catheterization in 2000 which showed normal coronary arteries. She has had a history of anal rectal cancer which was treated surgically as well as radiation and chemotherapy.  She is followed by DrMarland Kitchen Dalene Carrow and has planned for repeat lab and scans later on this summer.  She does have a sleep disorder.  She has had a history of a left bundle-branch block.  She has had some issues with anxiety and depression.  SURGICAL HISTORY:  Appendectomy, tonsillectomy, and C-section.  ALLERGIES:  CODEINE and IOHEXOL.  Apparently, the patient broke out in hives 30 years ago with a CT of the brain.  She has since had a hard cast without premeds and did fairly well.  CURRENT MEDICINES: 1. Calcium 2 times a day. 2. Carvedilol 12.5 mg b.i.d. 3. Zyrtec daily. 4. Vitamin D daily. 5. Celexa 40 mg daily. 6. Aldactone 25 daily. 7. Cozaar 100 mg a day. 8. Celexa 40 mg a day.  FAMILY HISTORY:  Her father died of unknown reasons.  Her mother lived up into her 23s and had no significant heart disease.  She  has 3 brothers who had no heart disease as well.  She has 2 children that are healthy.  SOCIAL HISTORY:  She is divorced.  She has 2 children.  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  She does not use alcohol or tobacco.  REVIEW OF SYSTEMS:  Positive for significant fatigue.  She is not really dizzy, but does feel wobbly.  She notes she was actually swerving and she was driving here to the office.  She has had multiple episodes of diaphoresis and clamminess.  She has not been able to eat.  She notes that she has had 2 bowel movements so far today and that is not really all that unusual for her.  She denies any fever or chills.  She has had a slight cough.  She has had no dysuria reported, but does have some issues with incontinence.  She absolutely denies chest pain.  She notes that she does not weigh on a daily basis.  She does not check  her blood pressure at home as well.  All other review of systems are negative.  PHYSICAL EXAMINATION:  GENERAL:  She is pleasant.  She seems mildly distressed.  She does have intermittent episodes where she becomes clammy and diaphoretic. VITAL SIGNS:  Blood pressure is down in the 70s. SKIN:  Somewhat clammy.  Color is somewhat sallow. HEENT:  Unremarkable. LUNGS:  Actually clear. CARDIAC:  Regular rhythm, question of S3. ABDOMEN:  Soft and nontender. EXTREMITIES:  No evidence of edema. NEUROLOGIC:  No gross focal deficits.  Gait was not tested.  Range of motion is intact.  LABORATORY DATA:  Her EKG today shows sinus rhythm.  She does have significant T-wave inversion anteriorly.  OVERALL IMPRESSION: 1. Multitude of somatic complaints of uncertain etiology. 2. Abnormal EKG. 3. Remote cardiac catheterization showing normal coronaries in 2000. 4. Longstanding nonischemic cardiomyopathy with current ejection     fraction of 30-35% range.  She has been classified as class 3 heart     failure. 5. History of anal rectal cancer, status post surgery, radiation, and     chemotherapy with current status unknown. 6. History of sleep apnea. 7. History of left bundle-branch block, however, this is not evident     today.  PLAN:  She is admitted to the Cardiology Service.  She will undergo repeat cardiac catheterization tomorrow per Dr. Peter [place].  Home medicines will be continued.  Laboratory data will be checked.  A repeat echo was not ordered since she has just had a study done in March 2012. Further disposition to follow once her lab data becomes available.     Dante Gang, N.P.   ______________________________ Vesta Mixer, M.D.    LCG/MEDQ  D:  11/27/2010  T:  11/28/2010  Job:  [id number]  Electronically Signed by Samantha [name] N.P. on 12/05/2010 01:23:53 PM Electronically Signed by Samantha [name] M.D. on 12/07/2010 09:15:58 AM

## 2010-12-18 NOTE — Discharge Summary (Signed)
NAMEJAMIEKA, Samantha [name] NO.:  [id number]  MEDICAL RECORD NO.:  [id number]  LOCATION:  6522                         FACILITY:  [facility]  PHYSICIAN:  Peter M. Swaziland, M.D.  DATE OF BIRTH:  04-11-1941  DATE OF ADMISSION:  11/27/2010 DATE OF DISCHARGE:  11/28/2010                              DISCHARGE SUMMARY   PROCEDURES: 1. Cardiac catheterization. 2. Coronary arteriogram. 3. Left ventriculogram.  PRIMARY FINAL DISCHARGE DIAGNOSES: 1. Hypotension. 2. Abnormal EKG with a left bundle-branch block.  SECONDARY DIAGNOSES: 1. Allergy or intolerance to CODEINE and IOHEXOL, as well as possible     DYE ALLERGY (initial reaction 30 years ago). 2. Conjunctivitis. 3. History of colon cancer. 4. History of febrile neutropenia and anemia. 5. History of hypokalemia. 6. Mucositis. 7. History of nonischemic cardiomyopathy with an ejection fraction of     40% at catheterization this admission. 8. Status post appendectomy, cesarean section, cholecystectomy,     tonsillectomy, Port-A-Cath with removal.  TIME AT DISCHARGE:  32 minutes.  HOSPITAL COURSE:  [name] is a 70 year old female with a history of nonischemic cardiomyopathy.  She was seen in the office on November 27, 2010, with hypotension and a systolic blood pressure in the 70s.  Her EKG was abnormal with an underlying left bundle-branch block and new T- wave changes.  She was admitted for further evaluation and treatment.  She was mildly anemic with a hemoglobin of 11.6.  Her BUN was mildly elevated at 26 with a creatinine of 1.2.  Cardiac enzymes were negative and a BNP was only minimally elevated at 179.8.  TSH was within normal limits.  Total cholesterol 192, triglycerides 83, HDL 59, LDL 116.  On November 28, 2010, she was taken to the cath lab by Dr. Swaziland.  She had normal coronary arteries and global hypokinesis with an EF of 40%.  He recommended continuing medical therapy.  Her Cozaar was decreased because of  her hypotension on admission.  Post cath, [name] is doing better and her vital signs are improved.  She is considered stable for discharge on November 28, 2010, to follow up as an outpatient.  DISCHARGE INSTRUCTIONS:  Her activity level is to be increased gradually with no driving for 2 days and no lifting with her right arm for a week. She is encouraged to stick to a low-sodium heart-healthy diet.  She is to call the office for problems with cath site.  She is to follow up with Dr. Elease Hashimoto on December 05, 2010, at 11:15.  She is to follow up with Dr. Wylene Simmer as needed.  DISCHARGE MEDICATIONS: 1. Losartan 100 mg 1-1/2 tablets daily. 2. Fish oil OTC daily. 3. Coreg 12.5 mg b.i.d. 4. Spirolactone 25 mg a day. 5. Zyrtec 10 mg daily. 6. Ocean nasal spray daily. 7. Vitamin D OTC daily. 8. Celexa 40 mg daily as at home. 9. Valacyclovir 500 mg b.i.d. as at home.     Theodore Demark, PA-C   ______________________________ Peter M. Swaziland, M.D.    RB/MEDQ  D:  11/28/2010  T:  11/29/2010  Job:  [id number]  cc:   Gaspar Garbe, M.D.  Electronically Signed by Theodore Demark PA-C  on 12/15/2010 11:05:49 AM Electronically Signed by PETER Swaziland M.D. on 12/18/2010 09:36:20 AM

## 2010-12-19 ENCOUNTER — Encounter (INDEPENDENT_AMBULATORY_CARE_PROVIDER_SITE_OTHER): Payer: Medicare Other | Admitting: Gynecology

## 2010-12-19 ENCOUNTER — Other Ambulatory Visit (HOSPITAL_COMMUNITY)
Admission: RE | Admit: 2010-12-19 | Discharge: 2010-12-19 | Disposition: A | Discharge status: Home / Self Care | Payer: Medicare Other | Source: Ambulatory Visit | Attending: Gynecology | Admitting: Gynecology

## 2010-12-19 ENCOUNTER — Other Ambulatory Visit: Payer: Self-pay | Admitting: Gynecology

## 2010-12-19 DIAGNOSIS — N952 Postmenopausal atrophic vaginitis: Secondary | ICD-10-CM

## 2010-12-19 DIAGNOSIS — K644 Residual hemorrhoidal skin tags: Secondary | ICD-10-CM

## 2010-12-19 DIAGNOSIS — R823 Hemoglobinuria: Secondary | ICD-10-CM

## 2010-12-19 DIAGNOSIS — Z124 Encounter for screening for malignant neoplasm of cervix: Secondary | ICD-10-CM | POA: Insufficient documentation

## 2010-12-19 DIAGNOSIS — A6004 Herpesviral vulvovaginitis: Secondary | ICD-10-CM

## 2010-12-22 ENCOUNTER — Ambulatory Visit (INDEPENDENT_AMBULATORY_CARE_PROVIDER_SITE_OTHER): Payer: Medicare Other | Admitting: Cardiovascular Disease

## 2010-12-22 ENCOUNTER — Encounter: Payer: Self-pay | Admitting: Cardiovascular Disease

## 2010-12-22 VITALS — BP 120/78 | HR 76 | Ht 65.0 in | Wt 170.0 lb

## 2010-12-22 DIAGNOSIS — I5022 Chronic systolic (congestive) heart failure: Secondary | ICD-10-CM

## 2010-12-22 DIAGNOSIS — I509 Heart failure, unspecified: Secondary | ICD-10-CM

## 2010-12-22 DIAGNOSIS — R0609 Other forms of dyspnea: Secondary | ICD-10-CM

## 2010-12-22 DIAGNOSIS — R0602 Shortness of breath: Secondary | ICD-10-CM

## 2010-12-22 DIAGNOSIS — R0989 Other specified symptoms and signs involving the circulatory and respiratory systems: Secondary | ICD-10-CM

## 2010-12-22 DIAGNOSIS — I428 Other cardiomyopathies: Secondary | ICD-10-CM

## 2010-12-22 LAB — BASIC METABOLIC PANEL
BUN: 21 mg/dL (ref 6–23)
CO2: 30 mEq/L (ref 19–32)
Calcium: 9.4 mg/dL (ref 8.4–10.5)
Chloride: 104 mEq/L (ref 96–112)
Creatinine, Ser: 0.9 mg/dL (ref 0.4–1.2)
GFR: 62.7 mL/min (ref >60.00)
Glucose, Bld: 103 mg/dL — ABNORMAL HIGH (ref 70–99)
Potassium: 4.8 mEq/L (ref 3.5–5.1)
Sodium: 139 mEq/L (ref 135–145)

## 2010-12-22 LAB — BRAIN NATRIURETIC PEPTIDE: Pro B Natriuretic peptide (BNP): 21 pg/mL (ref 0.0–100.0)

## 2010-12-22 MED ORDER — CARVEDILOL 25 MG PO TABS: 25.0000 mg | ORAL_TABLET | Freq: Two times a day (BID) | ORAL | Status: DC

## 2010-12-22 NOTE — Assessment & Plan Note (Addendum)
Her ejection fraction has improved slightly up to 40%. I like to increase her carvedilol to 25 mg twice a day. We will continue with the same dose of losartan. I'll see her again in 3 months for followup visit. We'll plan on doing an echocardiogram at that visit.  He needs to have a colonoscopy. He is at low risk for his upcoming colonoscopy

## 2010-12-22 NOTE — Patient Instructions (Signed)
You may have your colonoscopy .  The sedatives will be OK with your heart.  Echo in 3 months.

## 2010-12-22 NOTE — Assessment & Plan Note (Signed)
Pt has a LBBB with an EF of 40%.  Normal coronaries.  She is at low risk for her upcoming colonoscopy.

## 2010-12-22 NOTE — Progress Notes (Signed)
Samantha [name] Date of Birth  August 31, 1940 [facility] [address]     [address] [place], [place]  [address] [phone]  Fax  [phone]  History of Present Illness:  Middle age female with a hx of mild CHF. She was found have a new left bundle-branch block at her last visit. She was also having problems with chest discomfort and shortness breath. She was admitted for cardiac catheterization.   Cath revealed normal cornaries.  EF of 40%.  She was started on spironolactone 25 mg a day and losartan 50 mg a day. She's feeling about the same-perhaps a little better  Current Outpatient Prescriptions on File Prior to Visit  Medication Sig Dispense Refill  . CALCIUM PO Take by mouth.        . carvedilol (COREG) 12.5 MG tablet Take 1 tablet (12.5 mg total) by mouth 2 (two) times daily.  60 tablet  11  . Cetirizine HCl (ZYRTEC PO) Take by mouth.        . Cholecalciferol (VITAMIN D3) 1000 UNITS CAPS Take 1 capsule by mouth.        . citalopram (CELEXA) 40 MG tablet 1 tab po qd/ pt not taking regularly       . fish oil-omega-3 fatty acids 1000 MG capsule Take 2 g by mouth daily.        Marland Kitchen ibuprofen (ADVIL,MOTRIN) 400 MG tablet Take 400 mg by mouth as needed.        Marland Kitchen losartan (COZAAR) 100 MG tablet Take 50 mg by mouth daily.       . Probiotic Product (PROBIOTIC FORMULA PO) Take by mouth.        . spironolactone (ALDACTONE) 25 MG tablet Take 1 tablet (25 mg total) by mouth daily.  30 tablet  11  . valACYclovir (VALTREX) 500 MG tablet Take 500 mg by mouth 2 (two) times daily.          Allergies  Allergen Reactions  . Codeine   . Iohexol      Desc: Pt. states she broke out in hives 30 yrs ago w/ a CT Brain scan.  She has since had a heart cath and this CT today (08/10/05) w/o premeds and did well. No evident reaction.  Thanks.     Past Medical History  Diagnosis Date  . Conjunctivitis   . Febrile neutropenia   . Anemia   . Hypokalemia   .  Mucositis   . LBBB (left bundle branch block)   . Nonischemic cardiomyopathy     EF 30 to 35%  . S/P cardiac cath 2000    Normal coronaries  . CHD (coronary heart disease)   . Renal artery aneurysm   . Cancer of colon 08/2005    STAGE 1 CHEMO AND RADIATION    Past Surgical History  Procedure Date  . Appendectomy   . Cesarean section   . Cholecystectomy   . Tonsillectomy   . Port-a-cath removal   . US echocardiography 3/12    EF 30 to 35%  . Hemorroidectomy   . Tubal ligation     History  Smoking status  . Never Smoker   Smokeless tobacco  . Never Used    History  Alcohol Use No    Family History  Problem Relation Age of Onset  . Diabetes Sister   . Cancer Brother     PROSTATE    Reviw of Systems:  Reviewed in the HPI.  All other systems are negative.  Physical Exam: BP 120/78  Pulse 76  Ht 5\' 5"  (1.651 m)  Wt 170 lb (77.111 kg)  BMI 28.29 kg/m2 The patient is alert and oriented x 3.  The mood and affect are normal.   Skin: warm and dry.  Color is normal.    HEENT:   the sclera are nonicteric.  The mucous membranes are moist.  The carotids are 2+ without bruits.  There is no thyromegaly.  There is no JVD.    Lungs: clear.  The chest wall is non tender.    Heart: regular rate with a normal S1 and S2.  There are no murmurs, gallops, or rubs. The PMI is not displaced.     Abdomin: good bowel sounds.  There is no guarding or rebound.  There is no hepatosplenomegaly or tenderness.  There are no masses.   Extremities:  no clubbing, cyanosis, or edema.  The legs are without rashes.  The distal pulses are intact.   Neuro:  Cranial nerves II - XII are intact.  Motor and sensory functions are intact.    The gait is normal.  ECG:  Assessment / Plan:

## 2010-12-24 ENCOUNTER — Telehealth: Payer: Self-pay | Admitting: Physician Assistant

## 2010-12-24 NOTE — Telephone Encounter (Signed)
Patient seen in office recently.  Coreg increased to 25 mg BID. Took first of higher dosage yesterday.  This AM, felt lightheaded and has a mild HA. Took BP and it was 89 systolic.  Took, later and it was 116 and, with me on phone, it is 131. Denies syncope, near syncope, chest pain or dyspnea.  I advised her to take coreg 12.5 mg 1 and 1/2 tabs bid for 2 days, then increase to 25 mg bid. If she has recurrent problems with BP today, she should call back. If she has problems tolerating the 25 mg bid next week, she should call the office. She agrees to this plan.

## 2010-12-27 ENCOUNTER — Telehealth: Payer: Self-pay | Admitting: *Deleted

## 2010-12-27 NOTE — Telephone Encounter (Signed)
Patient called with lab results/msg left. Jodette Brittnei Jagiello RN  

## 2010-12-29 ENCOUNTER — Other Ambulatory Visit: Payer: Self-pay | Admitting: Hematology and Oncology

## 2010-12-29 ENCOUNTER — Encounter (HOSPITAL_BASED_OUTPATIENT_CLINIC_OR_DEPARTMENT_OTHER): Payer: Medicare Other | Admitting: Hematology and Oncology

## 2010-12-29 DIAGNOSIS — C211 Malignant neoplasm of anal canal: Secondary | ICD-10-CM

## 2010-12-29 LAB — CBC WITH DIFFERENTIAL/PLATELET
BASO%: 0.6 % (ref 0.0–2.0)
Basophils Absolute: 0 10*3/uL (ref 0.0–0.1)
EOS%: 4.3 % (ref 0.0–7.0)
Eosinophils Absolute: 0.2 10*3/uL (ref 0.0–0.5)
HCT: 32.1 % — ABNORMAL LOW (ref 34.8–46.6)
HGB: 11 g/dL — ABNORMAL LOW (ref 11.6–15.9)
LYMPH%: 32.7 % (ref 14.0–49.7)
MCH: 32.6 pg (ref 25.1–34.0)
MCHC: 34.3 g/dL (ref 31.5–36.0)
MCV: 95 fL (ref 79.5–101.0)
MONO#: 0.6 10*3/uL (ref 0.1–0.9)
MONO%: 14.3 % — ABNORMAL HIGH (ref 0.0–14.0)
NEUT#: 2.1 10*3/uL (ref 1.5–6.5)
NEUT%: 48.1 % (ref 38.4–76.8)
Platelets: 219 10*3/uL (ref 145–400)
RBC: 3.38 10*6/uL — ABNORMAL LOW (ref 3.70–5.45)
RDW: 13.6 % (ref 11.2–14.5)
WBC: 4.4 10*3/uL (ref 3.9–10.3)
lymph#: 1.4 10*3/uL (ref 0.9–3.3)

## 2010-12-29 LAB — COMPREHENSIVE METABOLIC PANEL
ALT: 16 U/L (ref 0–35)
AST: 20 U/L (ref 0–37)
Albumin: 4.4 g/dL (ref 3.5–5.2)
Alkaline Phosphatase: 62 U/L (ref 39–117)
BUN: 20 mg/dL (ref 6–23)
CO2: 26 mEq/L (ref 19–32)
Calcium: 9.8 mg/dL (ref 8.4–10.5)
Chloride: 104 mEq/L (ref 96–112)
Creatinine, Ser: 0.9 mg/dL (ref 0.50–1.10)
Glucose, Bld: 117 mg/dL — ABNORMAL HIGH (ref 70–99)
Potassium: 4.3 mEq/L (ref 3.5–5.3)
Sodium: 140 mEq/L (ref 135–145)
Total Bilirubin: 0.4 mg/dL (ref 0.3–1.2)
Total Protein: 6.8 g/dL (ref 6.0–8.3)

## 2011-01-03 ENCOUNTER — Other Ambulatory Visit: Payer: Self-pay | Admitting: *Deleted

## 2011-01-03 ENCOUNTER — Encounter (HOSPITAL_BASED_OUTPATIENT_CLINIC_OR_DEPARTMENT_OTHER): Payer: Medicare Other | Admitting: Hematology and Oncology

## 2011-01-03 DIAGNOSIS — C211 Malignant neoplasm of anal canal: Secondary | ICD-10-CM

## 2011-01-03 DIAGNOSIS — R0609 Other forms of dyspnea: Secondary | ICD-10-CM

## 2011-01-03 DIAGNOSIS — R0989 Other specified symptoms and signs involving the circulatory and respiratory systems: Secondary | ICD-10-CM

## 2011-01-10 ENCOUNTER — Telehealth: Payer: Self-pay | Admitting: Cardiovascular Disease

## 2011-01-10 NOTE — Telephone Encounter (Signed)
Called wanting to see if you could print a jury duty excusal letter for her for 01/29/11. Please call back. I have pulled her chart.

## 2011-01-11 NOTE — Telephone Encounter (Signed)
Note put in mail to excuse

## 2011-01-17 ENCOUNTER — Encounter: Payer: Self-pay | Admitting: Cardiovascular Disease

## 2011-01-22 ENCOUNTER — Telehealth: Payer: Self-pay | Admitting: Cardiovascular Disease

## 2011-01-22 NOTE — Telephone Encounter (Signed)
Has a question about her BP possibly being too low

## 2011-01-22 NOTE — Telephone Encounter (Signed)
PT C/O LOW BP, 106/79 PRIOR TO MORNING MEDS THEN WENT TO 77/47 AND FELT AWFUL TILL SHE HAD A SODA AND SOME POTATO CHIPS. SHE HAS HAD LOW BP FOR THREE DAYS NOW. REVIEWED AND CONSULTED WITH DR Swaziland, PT TO STOP ALDACTONE AND CONTINUE TO TAKE BP DAILY, CALL BACK ON Friday OR SOONER IF NEED to report bp readings.

## 2011-01-26 ENCOUNTER — Telehealth: Payer: Self-pay | Admitting: *Deleted

## 2011-01-26 ENCOUNTER — Telehealth: Payer: Self-pay | Admitting: Physician Assistant

## 2011-01-26 NOTE — Telephone Encounter (Signed)
Aldactone on hold/ 01/25/11 105/60, 116/58, 114/56                                01/26/11 107/54, 103/55.  Hr avg 80 Told to continue to monitor and call next week, pt agreed to.

## 2011-01-26 NOTE — Telephone Encounter (Signed)
Message copied by Antony Odea on Fri Jan 26, 2011  3:04 PM ------      Message from: Antony Odea      Created: Mon Jan 22, 2011 11:25 AM      Regarding: BP       HOLD ALDACTONE, PT WILL CALL Friday TO UPDATE

## 2011-01-26 NOTE — Telephone Encounter (Signed)
Pt called because she felt tired and when she checked her BP, it was OK but the machine indicated an irreg. HR w/ rate 91. She is not SOB at rest and has chronic DOE. Has done more than usual today also. She has taken her evening dose of Coreg as usual. Advised her EMS could assess her and transport if needed but pt did not feel that was needed at this time. She will rest and call EMS/come to ER if Sx do not improve.

## 2011-01-29 ENCOUNTER — Telehealth: Payer: Self-pay | Admitting: *Deleted

## 2011-01-29 NOTE — Telephone Encounter (Signed)
Agree, will follow up at next ov

## 2011-01-29 NOTE — Telephone Encounter (Signed)
113/55, no irreg hr since 8/10 which only lasts short periods per pt, pt states feeling really well today. Has app Wednesday 8/15. Pt satisfied with app date and doesn't feel need to be seen sooner.Alfonso Ramus RN

## 2011-01-29 NOTE — Telephone Encounter (Signed)
Agree  Alvino Chapel, will you call her and check on her.

## 2011-02-02 ENCOUNTER — Telehealth: Payer: Self-pay | Admitting: *Deleted

## 2011-02-02 NOTE — Telephone Encounter (Signed)
Pt out shopping, she will call today later or Monday with bp readings, stated bp been pretty good and will call back

## 2011-02-05 ENCOUNTER — Telehealth: Payer: Self-pay | Admitting: *Deleted

## 2011-02-05 NOTE — Telephone Encounter (Signed)
Message copied by Antony Odea on Mon Feb 05, 2011  4:12 PM ------      Message from: Antony Odea      Created: Mon Jan 22, 2011 11:25 AM      Regarding: BP       HOLD ALDACTONE, PT WILL CALL Friday TO UPDATE

## 2011-02-05 NOTE — Telephone Encounter (Signed)
Called to see how bp has been/ msg left,  pt to be seen this week.

## 2011-02-07 ENCOUNTER — Ambulatory Visit (INDEPENDENT_AMBULATORY_CARE_PROVIDER_SITE_OTHER): Payer: Medicare Other | Admitting: Cardiovascular Disease

## 2011-02-07 ENCOUNTER — Encounter: Payer: Self-pay | Admitting: Cardiovascular Disease

## 2011-02-07 VITALS — BP 150/86 | HR 62 | Ht 64.5 in | Wt 179.0 lb

## 2011-02-07 DIAGNOSIS — I447 Left bundle-branch block, unspecified: Secondary | ICD-10-CM

## 2011-02-07 DIAGNOSIS — I509 Heart failure, unspecified: Secondary | ICD-10-CM

## 2011-02-07 DIAGNOSIS — I5022 Chronic systolic (congestive) heart failure: Secondary | ICD-10-CM

## 2011-02-07 DIAGNOSIS — I10 Essential (primary) hypertension: Secondary | ICD-10-CM

## 2011-02-07 NOTE — Assessment & Plan Note (Signed)
Ejection fraction is 40%. We'll have her decrease her salt intake. I'll see her again in 3 months.

## 2011-02-07 NOTE — Assessment & Plan Note (Signed)
She's having been having blood pressure variability. Her spironolactone has been held. In talking to her, it is clear that she eats a fair amount of salt. We will have her restrict her intake of chips and other salty foods. We will have her measure her blood pressure on a daily basis. I'll see her again in 3 months.

## 2011-02-07 NOTE — Assessment & Plan Note (Signed)
Left bundle branch block is not seen today. It is clear that she has intermittent left bundle branch block. We'll continue with her same medications.

## 2011-02-07 NOTE — Progress Notes (Signed)
Samantha [name] Date of Birth  February 06, 1941 [facility] [address]     [address] [place], [place]  [address] [phone]  Fax  [phone]  History of Present Illness:  This is a 70 year old female with a history of moderate congestive heart failure ( EF 40%)  she has an intermittent left bundle branch block.  She's been having problems with palpitations and heart irregularities recently. She's also been having some problems with orthostatic hypotension and her spironolactone was discontinued.  She's been having these problems for several weeks. She's been reporting these heartbeat irregular to sonorous. In talking with her further, it sounds like her heart rate monitor notifies her with a heart rate is regular but she's remained fairly asymptomatic.  She denies any chest pain, syncope, presyncope, or shortness of breath.  Is clear that she eats a lot of salt. She eats potato chips or Fritos  almost every day.  Current Outpatient Prescriptions on File Prior to Visit  Medication Sig Dispense Refill  . carvedilol (COREG) 25 MG tablet Take 1 tablet (25 mg total) by mouth 2 (two) times daily.  60 tablet  11  . Cetirizine HCl (ZYRTEC PO) Take by mouth.        . Cholecalciferol (VITAMIN D3) 1000 UNITS CAPS Take 1 capsule by mouth.        . citalopram (CELEXA) 40 MG tablet 1 tab po qd/ pt not taking regularly       . fish oil-omega-3 fatty acids 1000 MG capsule Take 2 g by mouth daily.        Marland Kitchen ibuprofen (ADVIL,MOTRIN) 400 MG tablet Take 400 mg by mouth as needed.        Marland Kitchen losartan (COZAAR) 100 MG tablet Take 50 mg by mouth daily.       . Probiotic Product (PROBIOTIC FORMULA PO) Take by mouth.        . valACYclovir (VALTREX) 500 MG tablet Take 500 mg by mouth 2 (two) times daily.         Allergies  Allergen Reactions  . Codeine   . Iohexol      Desc: Pt. states she broke out in hives 30 yrs ago w/ a CT Brain scan.  She has since had a heart cath  and this CT today (08/10/05) w/o premeds and did well. No evident reaction.  Thanks.     Past Medical History  Diagnosis Date  . Conjunctivitis   . Febrile neutropenia   . Anemia   . Hypokalemia   . Mucositis   . LBBB (left bundle branch block)   . Nonischemic cardiomyopathy     EF 30 to 35%  . S/P cardiac cath 2000    Normal coronaries  . CHD (coronary heart disease)   . Renal artery aneurysm   . Cancer of colon 08/2005    STAGE 1 CHEMO AND RADIATION    Past Surgical History  Procedure Date  . Appendectomy   . Cesarean section   . Cholecystectomy   . Tonsillectomy   . Port-a-cath removal   . US echocardiography 3/12    EF 30 to 35%  . Hemorroidectomy   . Tubal ligation     History  Smoking status  . Never Smoker   Smokeless tobacco  . Never Used    History  Alcohol Use No    Family History  Problem Relation Age of Onset  . Diabetes Sister   . Cancer Brother  PROSTATE    Reviw of Systems:  Reviewed in the HPI.  All other systems are negative.  Physical Exam: BP 150/86  Pulse 62  Ht 5' 4.5" (1.638 m)  Wt 179 lb (81.194 kg)  BMI 30.25 kg/m2 The patient is alert and oriented x 3.  The mood and affect are normal.   Skin: warm and dry.  Color is normal.    HEENT:   the sclera are nonicteric.  The mucous membranes are moist.  The carotids are 2+ without bruits.  There is no thyromegaly.  There is no JVD.    Lungs: clear.  The chest wall is non tender.    Heart: regular rate with a normal S1 and S2.  There are no murmurs, gallops, or rubs. The PMI is not displaced.     Abdomen: good bowel sounds.  There is no guarding or rebound.  There is no hepatosplenomegaly or tenderness.  There are no masses.   Extremities:  no clubbing, cyanosis, or edema.  The legs are without rashes.  The distal pulses are intact.   Neuro:  Cranial nerves II - XII are intact.  Motor and sensory functions are intact.    The gait is normal.  ECG: Normal sinus rhythm.  Her left bundle branch block has resolved.  Assessment / Plan:

## 2011-02-08 ENCOUNTER — Encounter: Payer: Self-pay | Admitting: Cardiovascular Disease

## 2011-02-09 ENCOUNTER — Telehealth: Payer: Self-pay | Admitting: Cardiovascular Disease

## 2011-02-09 NOTE — Telephone Encounter (Signed)
[facility] Misty Stanley for Dr. Charna Elizabeth called to have nurse call them back in regards to colonoscopy on Monday.  Please try to call in the next 10-15 minutes per Misty Stanley to Dr. Kenna Gilbert direct cell phone.  Chart in box.    Update....wants to have our DOD , Dr. Patty Sermons call him very soon.

## 2011-02-09 NOTE — Telephone Encounter (Signed)
Dr. Patty Sermons spoke with Dr Loreta Ave

## 2011-04-03 ENCOUNTER — Telehealth: Payer: Self-pay | Admitting: Cardiovascular Disease

## 2011-04-03 NOTE — Telephone Encounter (Signed)
Spoke with pt. She is wondering if she needs echo prior to appt with Dr. Elease Hashimoto at the end of November. She is also wondering if she will have lab work that day.  It appears from office visit in July that pt is to have echo in 3 months but I will send to [facility] to clarify. Pt was also seen in  August. . Pt aware Jodette will call her back.  Pt will not be home tomorrow and can be reached on her cell phone--[phone].

## 2011-04-03 NOTE — Telephone Encounter (Signed)
Pt calling wanting to go over next visit and ask a couple of questions. Please return pt call.

## 2011-04-04 ENCOUNTER — Telehealth: Payer: Self-pay | Admitting: *Deleted

## 2011-04-04 DIAGNOSIS — I10 Essential (primary) hypertension: Secondary | ICD-10-CM

## 2011-04-04 DIAGNOSIS — I447 Left bundle-branch block, unspecified: Secondary | ICD-10-CM

## 2011-04-04 DIAGNOSIS — I509 Heart failure, unspecified: Secondary | ICD-10-CM

## 2011-04-04 NOTE — Telephone Encounter (Signed)
Made echo app,labs and app for same day for f/u. Pt agreed to date and times.

## 2011-04-05 NOTE — Telephone Encounter (Signed)
Pt returning call to Jodette. Please return pt call.

## 2011-04-05 NOTE — Telephone Encounter (Signed)
Pt made mistake, no call made today, pt has no needs.

## 2011-05-17 ENCOUNTER — Ambulatory Visit (INDEPENDENT_AMBULATORY_CARE_PROVIDER_SITE_OTHER): Payer: Medicare Other | Admitting: Cardiovascular Disease

## 2011-05-17 ENCOUNTER — Ambulatory Visit (HOSPITAL_COMMUNITY): Payer: Medicare Other | Attending: Cardiology | Admitting: Radiology

## 2011-05-17 ENCOUNTER — Encounter: Payer: Self-pay | Admitting: Cardiovascular Disease

## 2011-05-17 ENCOUNTER — Other Ambulatory Visit (INDEPENDENT_AMBULATORY_CARE_PROVIDER_SITE_OTHER): Payer: Medicare Other | Admitting: *Deleted

## 2011-05-17 VITALS — BP 118/71 | HR 68 | Ht 64.5 in | Wt 179.0 lb

## 2011-05-17 DIAGNOSIS — G4733 Obstructive sleep apnea (adult) (pediatric): Secondary | ICD-10-CM | POA: Insufficient documentation

## 2011-05-17 DIAGNOSIS — I5022 Chronic systolic (congestive) heart failure: Secondary | ICD-10-CM

## 2011-05-17 DIAGNOSIS — I447 Left bundle-branch block, unspecified: Secondary | ICD-10-CM

## 2011-05-17 DIAGNOSIS — I509 Heart failure, unspecified: Secondary | ICD-10-CM

## 2011-05-17 DIAGNOSIS — I10 Essential (primary) hypertension: Secondary | ICD-10-CM

## 2011-05-17 DIAGNOSIS — I079 Rheumatic tricuspid valve disease, unspecified: Secondary | ICD-10-CM | POA: Insufficient documentation

## 2011-05-17 DIAGNOSIS — R0602 Shortness of breath: Secondary | ICD-10-CM

## 2011-05-17 DIAGNOSIS — I059 Rheumatic mitral valve disease, unspecified: Secondary | ICD-10-CM | POA: Insufficient documentation

## 2011-05-17 LAB — BRAIN NATRIURETIC PEPTIDE: Pro B Natriuretic peptide (BNP): 68 pg/mL (ref 0.0–100.0)

## 2011-05-17 LAB — BASIC METABOLIC PANEL
BUN: 21 mg/dL (ref 6–23)
CO2: 28 mEq/L (ref 19–32)
Calcium: 9.2 mg/dL (ref 8.4–10.5)
Chloride: 107 mEq/L (ref 96–112)
Creatinine, Ser: 0.9 mg/dL (ref 0.4–1.2)
GFR: 65.01 mL/min (ref >60.00)
Glucose, Bld: 96 mg/dL (ref 70–99)
Potassium: 4.3 mEq/L (ref 3.5–5.1)
Sodium: 142 mEq/L (ref 135–145)

## 2011-05-17 NOTE — Progress Notes (Signed)
Samantha [name] Date of Birth  [birth year] [facility] [address]     [address] [place], [place]  [address] [phone]  Fax  [phone]  History of Present Illness:  This is a 70 year old female with a history of moderate congestive heart failure ( EF 40%)  she has an intermittent left bundle branch block.  She's been having problems with palpitations and heart irregularities recently. She's also been having some problems with orthostatic hypotension and her spironolactone was discontinued.  She denies any chest pain, dyspnea, or syncope. She does comment that she gets winded if she walks to the mailbox. She does not get any regular exercise. She continues to salt her food rather heavily.  She has been having leg pain.  She had an echocardiogram today to evaluate her ejection fraction. She has a left bundle branch block. She has an EF of less than 35% and she'll be a candidate for biventricular pacemaker/ICD.   Current Outpatient Prescriptions on File Prior to Visit  Medication Sig Dispense Refill  . CALCIUM PO Take by mouth.        . carvedilol (COREG) 25 MG tablet Take 1 tablet (25 mg total) by mouth 2 (two) times daily.  60 tablet  11  . Cetirizine HCl (ZYRTEC PO) Take by mouth.        . Cholecalciferol (VITAMIN D3) 1000 UNITS CAPS Take 1 capsule by mouth.        . citalopram (CELEXA) 40 MG tablet 1 tab po qd/ pt not taking regularly       . fish oil-omega-3 fatty acids 1000 MG capsule Take 2 g by mouth daily.        Marland Kitchen ibuprofen (ADVIL,MOTRIN) 400 MG tablet Take 400 mg by mouth as needed.        Marland Kitchen losartan (COZAAR) 100 MG tablet Take 50 mg by mouth daily.       . meclizine (ANTIVERT) 25 MG tablet Take 25 mg by mouth 4 (four) times daily.        . Probiotic Product (PROBIOTIC FORMULA PO) Take by mouth.        . valACYclovir (VALTREX) 500 MG tablet Take 500 mg by mouth 2 (two) times daily.         Allergies  Allergen Reactions  .  Codeine   . Iohexol      Desc: Pt. states she broke out in hives 30 yrs ago w/ a CT Brain scan.  She has since had a heart cath and this CT today (08/10/05) w/o premeds and did well. No evident reaction.  Thanks.     Past Medical History  Diagnosis Date  . Conjunctivitis   . Febrile neutropenia   . Anemia   . Hypokalemia   . Mucositis   . LBBB (left bundle branch block)   . Nonischemic cardiomyopathy     EF 30 to 35%  . S/P cardiac cath 2000    Normal coronaries  . CHD (coronary heart disease)   . Renal artery aneurysm   . Cancer of colon 08/2005    STAGE 1 CHEMO AND RADIATION    Past Surgical History  Procedure Date  . Appendectomy   . Cesarean section   . Cholecystectomy   . Tonsillectomy   . Port-a-cath removal   . US echocardiography 3/12    EF 30 to 35%  . Hemorroidectomy   . Tubal ligation     History  Smoking status  .  Never Smoker   Smokeless tobacco  . Never Used    History  Alcohol Use No    Family History  Problem Relation Age of Onset  . Diabetes Sister   . Cancer Brother     PROSTATE    Reviw of Systems:  Reviewed in the HPI.  All other systems are negative.  Physical Exam: BP 118/71  Pulse 68  Ht 5' 4.5" (1.638 m)  Wt 179 lb (81.194 kg)  BMI 30.25 kg/m2 The patient is alert and oriented x 3.  The mood and affect are normal.   Skin: warm and dry.  Color is normal.    HEENT:   the sclera are nonicteric.  The mucous membranes are moist.  The carotids are 2+ without bruits.  There is no thyromegaly.  There is no JVD.    Lungs: clear.  The chest wall is non tender.    Heart: regular rate with a normal S1 and S2.  There are no murmurs, gallops, or rubs. The PMI is not displaced.     Abdomen: good bowel sounds.  There is no guarding or rebound.  There is no hepatosplenomegaly or tenderness.  There are no masses.   Extremities:  no clubbing, cyanosis, or edema.  The legs are without rashes.  The distal pulses are intact.   Neuro:   Cranial nerves II - XII are intact.  Motor and sensory functions are intact.    The gait is normal.  ECG: Normal sinus rhythm. Her left bundle branch block has resolved.  Assessment / Plan:

## 2011-05-17 NOTE — Patient Instructions (Signed)
Your physician wants you to follow-up in: 6 MONTHS OR SOONER IF NEEDED You will receive a reminder letter in the mail two months in advance. If you don't receive a letter, please call our office to schedule the follow-up appointment.  Your physician recommends that you return for a FASTING lipid profile: 6 MONTHS 

## 2011-05-17 NOTE — Assessment & Plan Note (Addendum)
To continue with losartan and the carvedilol. I've asked her to avoid eating any exercise. She has an echocardiogram pending from today. If her ejection fraction is less than 35%, we will refer her to one of the electrophysiologists for placement of a biventricular pacemaker/ICD. I'll see her again in 6 months for an office visit and basic metabolic profile.

## 2011-05-29 ENCOUNTER — Telehealth: Payer: Self-pay | Admitting: *Deleted

## 2011-05-29 NOTE — Telephone Encounter (Signed)
Patient called to say that she can't hardly "hold her bladder".  Leaks pretty much throughout the day and feels like the bladder may have fallen.  Advised ov to check.

## 2011-05-30 ENCOUNTER — Encounter: Payer: Self-pay | Admitting: Gynecology

## 2011-05-30 ENCOUNTER — Ambulatory Visit (INDEPENDENT_AMBULATORY_CARE_PROVIDER_SITE_OTHER): Payer: Medicare Other | Admitting: Gynecology

## 2011-05-30 DIAGNOSIS — N390 Urinary tract infection, site not specified: Secondary | ICD-10-CM

## 2011-05-30 DIAGNOSIS — R300 Dysuria: Secondary | ICD-10-CM

## 2011-05-30 DIAGNOSIS — R82998 Other abnormal findings in urine: Secondary | ICD-10-CM

## 2011-05-30 MED ORDER — CIPROFLOXACIN HCL 250 MG PO TABS: 250.0000 mg | ORAL_TABLET | Freq: Two times a day (BID) | ORAL | Status: DC

## 2011-05-30 NOTE — Progress Notes (Signed)
Patient presents with one-week history of frequency and dysuria. She has some mild low back pain. No fevers chills nausea vomiting diarrhea or other constitutional symptoms.  Exam Spine straight without CVA tenderness Abdomen soft nontender without masses guarding rebound organomegaly  UA shows TNTC WBC 4-6 RBC occasional epi 1+ bacteria  Assessment and plan: UTI will treat with ciprofloxacin 250 twice a day x7 days. I gave her samples of Uribel to use every 6 hours for the first 24 hours. Follow up if symptoms persist or recur

## 2011-05-30 NOTE — Patient Instructions (Signed)
Take antibiotics as prescribed. Use Uribel samples 1 every 6 hours for the first 24 hours.

## 2011-05-30 NOTE — Progress Notes (Signed)
Addended by: Ether Griffins on: 05/30/2011 04:37 PM   Modules accepted: Orders

## 2011-05-31 ENCOUNTER — Telehealth: Payer: Self-pay | Admitting: *Deleted

## 2011-05-31 NOTE — Telephone Encounter (Signed)
Pt called saying pharmacy never got rx on last OV, rx for cirpro called in to [facility]

## 2011-06-04 ENCOUNTER — Inpatient Hospital Stay (HOSPITAL_COMMUNITY)
Admission: EM | Admit: 2011-06-04 | Discharge: 2011-06-09 | DRG: 389 | Disposition: A | Discharge status: Home / Self Care | Payer: Medicare Other | Attending: Internal Medicine | Admitting: Internal Medicine

## 2011-06-04 ENCOUNTER — Emergency Department (HOSPITAL_COMMUNITY): Payer: Medicare Other

## 2011-06-04 ENCOUNTER — Encounter (HOSPITAL_COMMUNITY): Payer: Self-pay | Admitting: Radiology

## 2011-06-04 ENCOUNTER — Other Ambulatory Visit: Payer: Self-pay

## 2011-06-04 DIAGNOSIS — K56609 Unspecified intestinal obstruction, unspecified as to partial versus complete obstruction: Secondary | ICD-10-CM

## 2011-06-04 DIAGNOSIS — E559 Vitamin D deficiency, unspecified: Secondary | ICD-10-CM | POA: Diagnosis present

## 2011-06-04 DIAGNOSIS — K565 Intestinal adhesions [bands], unspecified as to partial versus complete obstruction: Principal | ICD-10-CM | POA: Diagnosis present

## 2011-06-04 DIAGNOSIS — I10 Essential (primary) hypertension: Secondary | ICD-10-CM

## 2011-06-04 DIAGNOSIS — Z85038 Personal history of other malignant neoplasm of large intestine: Secondary | ICD-10-CM

## 2011-06-04 DIAGNOSIS — Z79899 Other long term (current) drug therapy: Secondary | ICD-10-CM

## 2011-06-04 DIAGNOSIS — G2581 Restless legs syndrome: Secondary | ICD-10-CM | POA: Diagnosis present

## 2011-06-04 DIAGNOSIS — I447 Left bundle-branch block, unspecified: Secondary | ICD-10-CM

## 2011-06-04 DIAGNOSIS — I2589 Other forms of chronic ischemic heart disease: Secondary | ICD-10-CM | POA: Diagnosis present

## 2011-06-04 DIAGNOSIS — I5022 Chronic systolic (congestive) heart failure: Secondary | ICD-10-CM

## 2011-06-04 DIAGNOSIS — Z833 Family history of diabetes mellitus: Secondary | ICD-10-CM

## 2011-06-04 DIAGNOSIS — I509 Heart failure, unspecified: Secondary | ICD-10-CM | POA: Diagnosis present

## 2011-06-04 DIAGNOSIS — G4733 Obstructive sleep apnea (adult) (pediatric): Secondary | ICD-10-CM | POA: Diagnosis present

## 2011-06-04 DIAGNOSIS — Z9221 Personal history of antineoplastic chemotherapy: Secondary | ICD-10-CM

## 2011-06-04 DIAGNOSIS — R1110 Vomiting, unspecified: Secondary | ICD-10-CM

## 2011-06-04 DIAGNOSIS — Z8042 Family history of malignant neoplasm of prostate: Secondary | ICD-10-CM

## 2011-06-04 DIAGNOSIS — R9431 Abnormal electrocardiogram [ECG] [EKG]: Secondary | ICD-10-CM

## 2011-06-04 DIAGNOSIS — R109 Unspecified abdominal pain: Secondary | ICD-10-CM

## 2011-06-04 DIAGNOSIS — M353 Polymyalgia rheumatica: Secondary | ICD-10-CM | POA: Diagnosis present

## 2011-06-04 DIAGNOSIS — Z923 Personal history of irradiation: Secondary | ICD-10-CM

## 2011-06-04 DIAGNOSIS — F341 Dysthymic disorder: Secondary | ICD-10-CM | POA: Diagnosis present

## 2011-06-04 LAB — CBC
HCT: 40.5 % (ref 36.0–46.0)
HCT: 38 % (ref 36.0–46.0)
Hemoglobin: 13.7 g/dL (ref 12.0–15.0)
Hemoglobin: 12.8 g/dL (ref 12.0–15.0)
MCH: 32.2 pg (ref 26.0–34.0)
MCH: 32.1 pg (ref 26.0–34.0)
MCHC: 33.8 g/dL (ref 30.0–36.0)
MCHC: 33.7 g/dL (ref 30.0–36.0)
MCV: 95.1 fL (ref 78.0–100.0)
MCV: 95.2 fL (ref 78.0–100.0)
Platelets: 277 10*3/uL (ref 150–400)
Platelets: 258 10*3/uL (ref 150–400)
RBC: 4.26 MIL/uL (ref 3.87–5.11)
RBC: 3.99 MIL/uL (ref 3.87–5.11)
RDW: 13.1 % (ref 11.5–15.5)
RDW: 13.1 % (ref 11.5–15.5)
WBC: 13 10*3/uL — ABNORMAL HIGH (ref 4.0–10.5)
WBC: 11.1 10*3/uL — ABNORMAL HIGH (ref 4.0–10.5)

## 2011-06-04 LAB — URINE MICROSCOPIC-ADD ON

## 2011-06-04 LAB — DIFFERENTIAL
Basophils Absolute: 0 10*3/uL (ref 0.0–0.1)
Basophils Relative: 0 % (ref 0–1)
Eosinophils Absolute: 0 10*3/uL (ref 0.0–0.7)
Eosinophils Relative: 0 % (ref 0–5)
Lymphocytes Relative: 14 % (ref 12–46)
Lymphs Abs: 1.8 10*3/uL (ref 0.7–4.0)
Monocytes Absolute: 1.1 10*3/uL — ABNORMAL HIGH (ref 0.1–1.0)
Monocytes Relative: 9 % (ref 3–12)
Neutro Abs: 10 10*3/uL — ABNORMAL HIGH (ref 1.7–7.7)
Neutrophils Relative %: 77 % (ref 43–77)

## 2011-06-04 LAB — HEPATIC FUNCTION PANEL
ALT: 17 U/L (ref 0–35)
AST: 26 U/L (ref 0–37)
Albumin: 4.3 g/dL (ref 3.5–5.2)
Alkaline Phosphatase: 71 U/L (ref 39–117)
Bilirubin, Direct: 0.1 mg/dL (ref 0.0–0.3)
Indirect Bilirubin: 0.5 mg/dL (ref 0.3–0.9)
Total Bilirubin: 0.6 mg/dL (ref 0.3–1.2)
Total Protein: 8.2 g/dL (ref 6.0–8.3)

## 2011-06-04 LAB — BASIC METABOLIC PANEL
BUN: 29 mg/dL — ABNORMAL HIGH (ref 6–23)
CO2: 22 mEq/L (ref 19–32)
Calcium: 9.7 mg/dL (ref 8.4–10.5)
Chloride: 102 mEq/L (ref 96–112)
Creatinine, Ser: 0.86 mg/dL (ref 0.50–1.10)
GFR calc Af Amer: 78 mL/min — ABNORMAL LOW (ref >90)
GFR calc non Af Amer: 67 mL/min — ABNORMAL LOW (ref >90)
Glucose, Bld: 136 mg/dL — ABNORMAL HIGH (ref 70–99)
Potassium: 4.2 mEq/L (ref 3.5–5.1)
Sodium: 140 mEq/L (ref 135–145)

## 2011-06-04 LAB — URINALYSIS, ROUTINE W REFLEX MICROSCOPIC
Glucose, UA: NEGATIVE mg/dL
Ketones, ur: 15 mg/dL — AB
Leukocytes, UA: NEGATIVE
Nitrite: NEGATIVE
Protein, ur: 100 mg/dL — AB
Specific Gravity, Urine: 1.029 (ref 1.005–1.030)
Urobilinogen, UA: 1 mg/dL (ref 0.0–1.0)
pH: 6 (ref 5.0–8.0)

## 2011-06-04 LAB — CREATININE, SERUM
Creatinine, Ser: 0.88 mg/dL (ref 0.50–1.10)
GFR calc Af Amer: 76 mL/min — ABNORMAL LOW (ref >90)
GFR calc non Af Amer: 66 mL/min — ABNORMAL LOW (ref >90)

## 2011-06-04 LAB — LIPASE, BLOOD: Lipase: 37 U/L (ref 11–59)

## 2011-06-04 MED ORDER — ONDANSETRON HCL 4 MG/2ML IJ SOLN
4.0000 mg | Freq: Once | INTRAMUSCULAR | Status: AC
  Administered 2011-06-04: 4 mg via INTRAVENOUS
  Filled 2011-06-04 (×2): qty 2

## 2011-06-04 MED ORDER — CIPROFLOXACIN IN D5W 400 MG/200ML IV SOLN
400.0000 mg | Freq: Two times a day (BID) | INTRAVENOUS | Status: DC
  Administered 2011-06-04 – 2011-06-09 (×10): 400 mg via INTRAVENOUS
  Filled 2011-06-04 (×11): qty 200

## 2011-06-04 MED ORDER — BIOTENE DRY MOUTH MT LIQD
15.0000 mL | Freq: Two times a day (BID) | OROMUCOSAL | Status: DC
  Administered 2011-06-05 – 2011-06-07 (×3): 15 mL via OROMUCOSAL

## 2011-06-04 MED ORDER — ENOXAPARIN SODIUM 40 MG/0.4ML ~~LOC~~ SOLN
40.0000 mg | SUBCUTANEOUS | Status: DC
  Administered 2011-06-04 – 2011-06-07 (×2): 40 mg via SUBCUTANEOUS
  Filled 2011-06-04 (×6): qty 0.4

## 2011-06-04 MED ORDER — IOHEXOL 300 MG/ML  SOLN
80.0000 mL | Freq: Once | INTRAMUSCULAR | Status: AC | PRN
  Administered 2011-06-04: 80 mL via INTRAVENOUS

## 2011-06-04 MED ORDER — SODIUM CHLORIDE 0.9 % IV SOLN
INTRAVENOUS | Status: DC
  Administered 2011-06-04: 1000 mL via INTRAVENOUS

## 2011-06-04 MED ORDER — POTASSIUM CHLORIDE IN NACL 20-0.9 MEQ/L-% IV SOLN
INTRAVENOUS | Status: DC
  Administered 2011-06-04 – 2011-06-08 (×7): via INTRAVENOUS
  Filled 2011-06-04 (×9): qty 1000

## 2011-06-04 MED ORDER — SODIUM CHLORIDE 0.9 % IV BOLUS (SEPSIS)
500.0000 mL | Freq: Once | INTRAVENOUS | Status: AC
  Administered 2011-06-04: 1000 mL via INTRAVENOUS

## 2011-06-04 MED ORDER — ACETAMINOPHEN 325 MG PO TABS: 650.0000 mg | ORAL_TABLET | Freq: Four times a day (QID) | ORAL | Status: DC | PRN

## 2011-06-04 MED ORDER — MORPHINE SULFATE 2 MG/ML IJ SOLN: 1.0000 mg | INTRAMUSCULAR | Status: DC | PRN

## 2011-06-04 MED ORDER — CHLORHEXIDINE GLUCONATE 0.12 % MT SOLN
15.0000 mL | Freq: Two times a day (BID) | OROMUCOSAL | Status: DC
  Administered 2011-06-05 – 2011-06-09 (×7): 15 mL via OROMUCOSAL
  Filled 2011-06-04 (×8): qty 15

## 2011-06-04 MED ORDER — LORAZEPAM 2 MG/ML IJ SOLN
0.5000 mg | Freq: Four times a day (QID) | INTRAMUSCULAR | Status: DC | PRN
  Administered 2011-06-08: 0.5 mg via INTRAVENOUS
  Filled 2011-06-04: qty 1

## 2011-06-04 MED ORDER — ONDANSETRON HCL 4 MG/2ML IJ SOLN: 4.0000 mg | Freq: Four times a day (QID) | INTRAMUSCULAR | Status: DC | PRN

## 2011-06-04 MED ORDER — ACETAMINOPHEN 650 MG RE SUPP: 650.0000 mg | Freq: Four times a day (QID) | RECTAL | Status: DC | PRN

## 2011-06-04 MED ORDER — ONDANSETRON HCL 4 MG/2ML IJ SOLN
4.0000 mg | Freq: Once | INTRAMUSCULAR | Status: AC
  Administered 2011-06-04: 4 mg via INTRAVENOUS

## 2011-06-04 MED ORDER — LORAZEPAM 2 MG/ML IJ SOLN
0.5000 mg | Freq: Once | INTRAMUSCULAR | Status: AC
  Administered 2011-06-04: 0.5 mg via INTRAVENOUS
  Filled 2011-06-04: qty 1

## 2011-06-04 MED ORDER — SODIUM CHLORIDE 0.9 % IV SOLN: Freq: Once | INTRAVENOUS | Status: DC

## 2011-06-04 NOTE — ED Provider Notes (Signed)
History     CSN: [id number] Arrival date & time: 06/04/2011 11:21 AM   First MD Initiated Contact with Patient 06/04/11 1234      Chief Complaint  Patient presents with  . Emesis     HPI  70yoF h/o Stage1 colon cancer status post chemotherapy radiation in remission, ischemic cardiomyopathy with an ejection fraction of 30-35% history of multiple bowel surgeries presents with nausea, vomiting. The patient complains of constant nausea and nonbilious nonbloody emesis for 3 days. She is unable to tolerate by mouth. She denies diarrhea. She has chronic constipation for which she takes laxatives. Her last bowel movement was yesterday. She continues to pass gas. She feels mildly bloated. She denies fever, chills. She is having intermittent periumbilical cramping, none currently. She's currently being treated for urinary tract infection with Cipro but has not been able to tolerate x2 days. Denies hematuria/dysuria/freq/urgency. She has had a history of appendectomy, C-section, cholecystectomy. She is Status post radiation to the abdomen for her stage I colon cancer. No h/o obstructions. Denies headache, dizziness, change in vision. Denies numbness/tingling/weakness of extremities. Denies chest pain/SOB. Feels dehydrated.   Past Medical History  Diagnosis Date  . Conjunctivitis   . Febrile neutropenia   . Anemia   . Hypokalemia   . Mucositis   . LBBB (left bundle branch block)   . Nonischemic cardiomyopathy     EF 30 to 35%  . S/P cardiac cath 2000    Normal coronaries  . CHD (coronary heart disease)   . Renal artery aneurysm   . Cancer of colon 08/2005    STAGE 1 CHEMO AND RADIATION    Past Surgical History  Procedure Date  . Appendectomy   . Cesarean section   . Cholecystectomy   . Tonsillectomy   . Port-a-cath removal   . US echocardiography 3/12    EF 30 to 35%  . Hemorroidectomy   . Tubal ligation     Family History  Problem Relation Age of Onset  . Diabetes Sister   .  Cancer Brother     PROSTATE    History  Substance Use Topics  . Smoking status: Never Smoker   . Smokeless tobacco: Never Used  . Alcohol Use: No    OB History    Grav Para Term Preterm Abortions TAB SAB Ect Mult Living   2 2 2       2       Review of Systems except as noted HPI  Allergies  Codeine and Iohexol  Home Medications   Current Outpatient Rx  Name Route Sig Dispense Refill  . CALCIUM PO Oral Take 1 tablet by mouth daily.     Marland Kitchen CARVEDILOL 25 MG PO TABS Oral Take 1 tablet (25 mg total) by mouth 2 (two) times daily. 60 tablet 11  . ZYRTEC PO Oral Take 1 tablet by mouth daily.     Marland Kitchen VITAMIN D3 1000 UNITS PO CAPS Oral Take 1 capsule by mouth.      Marland Kitchen CIPROFLOXACIN HCL 250 MG PO TABS Oral Take 1 tablet (250 mg total) by mouth 2 (two) times daily. For 7 days  14 tablet 0  . CITALOPRAM HYDROBROMIDE 40 MG PO TABS  1 tab po qd/ pt not taking regularly     . IBUPROFEN 400 MG PO TABS Oral Take 400 mg by mouth as needed.      Marland Kitchen LOSARTAN POTASSIUM 100 MG PO TABS Oral Take 50 mg by mouth daily.     Marland Kitchen  VALACYCLOVIR HCL 500 MG PO TABS Oral Take 500 mg by mouth 2 (two) times daily.       BP 149/77  Pulse 93  Temp(Src) 98.2 F (36.8 C) (Oral)  Resp 22  SpO2 98%  Physical Exam  Nursing note and vitals reviewed. Constitutional: She is oriented to person, place, and time. She appears well-developed.  HENT:  Head: Atraumatic.  Nose: Nose normal.  Mouth/Throat: No oropharyngeal exudate.       Mm dry  Eyes: Conjunctivae and EOM are normal. Pupils are equal, round, and reactive to light.  Neck: Normal range of motion. Neck supple.  Cardiovascular: Normal rate, regular rhythm, normal heart sounds and intact distal pulses.   Pulmonary/Chest: Effort normal and breath sounds normal. No respiratory distress. She has no wheezes. She has no rales. She exhibits no tenderness.  Abdominal: Soft. Bowel sounds are normal. She exhibits distension. There is tenderness. There is no rebound  and no guarding.       Min periumbilical ttp  Musculoskeletal: Normal range of motion. She exhibits no edema and no tenderness.  Neurological: She is alert and oriented to person, place, and time. No cranial nerve deficit. She exhibits normal muscle tone. Coordination normal.       Strength 5/5 all extremities No pronator drift No facial droop   Skin: Skin is warm and dry. No rash noted.  Psychiatric: She has a normal mood and affect.      Date: 06/04/2011  Rate: 87  Rhythm: normal sinus rhythm occ pvc  QRS Axis: normal  Intervals: QT prolonged  ST/T Wave abnormalities: nonspecific T wave changes  Conduction Disutrbances:none  Narrative Interpretation:   Old EKG Reviewed: changes noted qtc more prolonged   ED Course  Procedures (including critical care time)  Labs Reviewed  CBC - Abnormal; Notable for the following:    WBC 13.0 (*)    All other components within normal limits  DIFFERENTIAL - Abnormal; Notable for the following:    Neutro Abs 10.0 (*)    Monocytes Absolute 1.1 (*)    All other components within normal limits  BASIC METABOLIC PANEL - Abnormal; Notable for the following:    Glucose, Bld 136 (*)    BUN 29 (*)    GFR calc non Af Amer 67 (*)    GFR calc Af Amer 78 (*)    All other components within normal limits  URINALYSIS, ROUTINE W REFLEX MICROSCOPIC - Abnormal; Notable for the following:    Color, Urine GREEN (*) BIOCHEMICALS MAY BE AFFECTED BY COLOR   Hgb urine dipstick MODERATE (*)    Bilirubin Urine SMALL (*)    Ketones, ur 15 (*)    Protein, ur 100 (*)    All other components within normal limits  LIPASE, BLOOD  HEPATIC FUNCTION PANEL  URINE MICROSCOPIC-ADD ON   Dg Chest 2 View  06/04/2011  *RADIOLOGY REPORT*  Clinical Data: Cough, weakness, nausea, vomiting.  CHEST - 2 VIEW  Comparison: 11/01/2009  Findings: Heart and mediastinal contours are within normal limits. No focal opacities or effusions.  No acute bony abnormality.  IMPRESSION: No  active cardiopulmonary disease.  Original Report Authenticated By: Cyndie Chime, M.D.   Ct Abdomen Pelvis W Contrast  06/04/2011  *RADIOLOGY REPORT*  Clinical Data: Vomiting, weakness.  CT ABDOMEN AND PELVIS WITH CONTRAST  Technique:  Multidetector CT imaging of the abdomen and pelvis was performed following the standard protocol during bolus administration of intravenous contrast.  Contrast: 80mL OMNIPAQUE IOHEXOL 300  MG/ML IV SOLN  Comparison: 08/24/2010  Findings: Lung bases are clear.  No effusions.  Heart is normal size.  Prior cholecystectomy.  Common bile duct is prominent, measuring up to 1.7 cm.  This likely is related to post cholecystectomy state, but recommend correlation with LFTs.  Liver, spleen, pancreas, adrenals, kidneys are normal.  There are dilated fluid-filled small bowel loops in the abdomen and pelvis. Findings compatible with small bowel obstruction. Transition point appears to be in the mid pelvis on image 58. No obstructing mass or lesion.  Therefore, findings presumably related to adhesions.  Distal small bowel and colon are decompressed and unremarkable.  There is a small amount of free fluid in the pelvis. Uterus and adnexa are unremarkable as is the urinary bladder.  No acute bony abnormality.  Aortic and iliac calcifications.  No aneurysm.  IMPRESSION: Small bowel obstruction.  Transition point is in the mid pelvis without visible cause, presumably adhesions.  Small amount of free fluid in the pelvis.  Original Report Authenticated By: Cyndie Chime, M.D.     1. Small bowel obstruction   2. Vomiting     MDM  Presents with nausea, vomiting. Found to have small bowel obstruction without focal transition by CT scan. She's been given IV fluids in the emergency department. She status post 1 L of fluid without any shortness of breath. Her lungs remain clear. She continues to have nausea despite Zofran. Declining pain medication at this time. We'll discuss with Gen. surgery.  Anticipate admission.   DW OR nurse/Dr. Carolynne Edouard. Due to comorbid conditions admit to medicine with surgery consult.   DW Dr. Wylene Simmer who will see pt. No holding orders to be placed until he has seen her in ED.  Forbes Cellar, MD 06/04/11 1626

## 2011-06-04 NOTE — ED Notes (Signed)
Vomiting since sat and now feels weak when she  walks

## 2011-06-04 NOTE — Consult Note (Signed)
Brief Consult, full note to follow.  [phone] with multiple abd  surgical hx presents with 2.5 day c/o N/V, abd discomfort. She had BM day of sxs onset but very little since. Came to ER for eval, CT finds dilated bowel c/w SBO, likely from adhesions. Multiple medical hx including, HTN, CHF--see Nahser  PE: BP 149/77  Pulse 93  Temp(Src) 98.2 F (36.8 C) (Oral)  Resp 22  SpO2 98% Abdomen: distended but soft, NT Few scattered BS.  Imp: SBO likely secondary to adhesions  Plan: D/W attending. rec medicine admit due to medical co-morbidities, Cards eval in event OR needed. NGT and bowel rest, serial x-rays in am.

## 2011-06-04 NOTE — H&P (Signed)
Samantha [name], COWELL NO.:  [id number]  MEDICAL RECORD NO.:  [id number]  LOCATION:  [facility]                        FACILITY:  [facility]  PHYSICIAN:  Gaspar Garbe, M.D.DATE OF BIRTH:  January 24, 1941  DATE OF ADMISSION:  06/04/2011 DATE OF DISCHARGE:                             HISTORY & PHYSICAL   CHIEF COMPLAINT:  Small bowel obstruction, nausea and vomiting.  HISTORY OF PRESENT ILLNESS:  The patient is a 70 year old white female with a history of rectal cancer and prior abdominal surgeries, who comes into the emergency room with several days worth of nausea and vomiting. She had been treated as an outpatient for an urinary tract infection but was unable to keep her Cipro down.  When she was seen by the emergency room physician, she was found to have distention of her abdomen.  A subsequent CT showed a small bowel obstruction with no clear mass indicated, but possibility of adhesions causing this to be the problem. The patient over the ER physician called Dr. Carolynne Edouard with General Surgery who thought that she was too medically complex and need to be admitted by primary care.  She has not yet been seen by the surgeons, but they will be seeing her later today.  MEDICATIONS: 1. Stool softener once daily. 2. Meclizine 25 mg q.6 h. as needed for dizziness. 3. Losartan 50 mg daily. 4. Carvedilol 3.125 mg once daily. 5. Celexa 40 mg once daily. 6. Fish oil 1000 mg once daily. 7. Vitamin D 2000 units once daily. 8. Lutein 20 mg capsules once daily. 9. Zyrtec Allergy 10 mg once daily. 10.Ibuprofen 600 mg p.r.n.  ALLERGIES:  CODEINE.  PAST MEDICAL HISTORY: 1. 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  Radiation mitomycin/5FU.  The patient has been seen by     Dr. Dalene Carrow, Dr. Abbey Chatters as well as Dr. Loreta Ave.  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 2. 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  She  is due for followup in     this in March 2013 and has been followed throughout the past year. 3. Polymyalgia rheumatica. 4. Osteoarthritis. 5. Osteopenia. 6. Vitamin D deficiency. 7. Venous insufficiency/restless legs.  Has been seen by Dr. Sondra Come at     the pain clinic. 8. Depression. 9. Allergic rhinitis. 10.Mitral valve prolapse plus left bundle-branch block with a negative     stress test by Dr. Elease Hashimoto in 2011, left Baker cyst.  PAST SURGICAL HISTORY:  Extensive abdominal surgeries including cholecystectomy August 2002, had an appendectomy in 10th grade and anal cancer resection by Dr. Abbey Chatters in 2007.  She has also had right carpal tunnel in 2006, tubal ligation, C-section, tonsils needs 30, hemorrhoidectomy, and a history of a negative cath in 2000.  SOCIAL HISTORY:  The patient is divorced with 2 children.  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  She is working part-time jobs now.  FAMILY HISTORY:  Father died in his 30s of an MI.  Mother is alive, had a history of renal cancer status  post nephrectomy who was born in 12. There is an aunt with osteoporosis.  One sister with diabetes.  Three brothers and 1 son who is healthy and one daughter with RA and hypothyroidism.  REVIEW OF SYSTEMS:  Other than for abdominal bloating and nausea, otherwise Negative x 12 system points.  PHYSICAL EXAMINATION:  VITAL SIGNS:  Temperature 98.2, pulse 93, respiratory rate 22, blood pressure 149/77, satting 98% on room air. GENERAL:  No acute distress. HEENT:  Normocephalic, atraumatic.  PERRLA, EOMI.  ENT is within normal limits. NECK:  Supple.  No lymphadenopathy, JVD or bruit. HEART:  Regular rate and rhythm.  No murmur, rub, or gallop. LUNGS:  Clear to auscultation bilaterally.   ABDOMEN:  Distended, nontender.  Normoactive bowel sounds.  No rebound or guarding. EXTREMITIES:  No clubbing, cyanosis, or edema. NEURO:  The patient is  oriented to person, place, time without difficulties.  LABORATORY TESTING:  White count elevated at 13.0 with a left shift with neutrophil count of 10, BUN and creatinine are 29 and 0.8 respectively. LFTs are otherwise within normal limits.  The patient had a normal 2 view of her chest.  CT of abdomen and pelvis, shows her to have a prior cholecystectomy.  Common bile duct being prominent up to 1.7 cm.  Liver, spleen, pancreas, adrenals, and kidneys are otherwise normal.  She has dilated fluid-filled small bowel loops in the abdomen and pelvis, findings compatible with small bowel obstruction.  Transition point appears to be in the mid pelvis on image 58.  No obstructing mass or lesion, therefore findings presumably related to adhesions.  Distal small bowel and colon are decompressed and unremarkable.  There is small amount of free fluid in the pelvis.  Uterus and adnexa are unremarkable as is the urinary bladder.  No acute bony abnormality.  ASSESSMENT AND PLAN: 1. Small bowel obstruction.  The patient will be seen back by Dr.     Carolynne Edouard.  Given her extensive abdominal surgical history, she may     require a lysis of adhesions.  I have asked the ER to place an NG     tube which had not been done at the time when they called me.  She     is a bit anxious at that time and they did give her some Ativan.     We will continue follow up on this next urinary tract infection.     Her urinalysis is otherwise bland, but we will complete her course     of IV since she was unable tolerate to p.o's  and in fact should     have NG suction running now with bilious looking products, about 500cc out so far.  Abdominal x-ray ordered for the AM. 2. Hypertension, currently stable.  She is on minimal medicines.  We     will adjust these by IV as needed. 3. She is otherwise medically stable and has followed up with     Cardiology in the past.  Does not have any     ongoing issues.  Most of her issues are  related to her prior     cancers and to be managed surgically.  We have opted to admit the     patient as the surgical service chose not to, but we will allow     them to make primary decisions based on the fact that she has a     small bowel obstruction. 4.  Depression: Will hold her Celexa as  NPO and use Ativan IV for anxiety or sleep. 5. DVT prophylaxis per Lovenox.     Gaspar Garbe, M.D.     RWT/MEDQ  D:  06/04/2011  T:  06/04/2011  Job:  [id number]  cc:   Ollen Gross. Vernell Morgans, M.D.

## 2011-06-04 NOTE — ED Notes (Signed)
Pt states that she was treated for uti last week but did not finish her cipro

## 2011-06-04 NOTE — ED Notes (Signed)
General Surgery called

## 2011-06-04 NOTE — ED Notes (Signed)
Samantha [name] [phone]

## 2011-06-04 NOTE — ED Notes (Signed)
General surgery recalled

## 2011-06-04 NOTE — ED Notes (Signed)
Report received from [place], [place]. Pt arrives vomiting, asking for zofran. Introduced self to patient and explained that the doctor orders a NG tube. Patient very anxious about procedure. Dr. Hyman Hopes made aware. New orders received for ativan

## 2011-06-05 ENCOUNTER — Inpatient Hospital Stay (HOSPITAL_COMMUNITY): Payer: Medicare Other

## 2011-06-05 DIAGNOSIS — K56609 Unspecified intestinal obstruction, unspecified as to partial versus complete obstruction: Secondary | ICD-10-CM

## 2011-06-05 LAB — COMPREHENSIVE METABOLIC PANEL
ALT: 27 U/L (ref 0–35)
AST: 28 U/L (ref 0–37)
Albumin: 3.4 g/dL — ABNORMAL LOW (ref 3.5–5.2)
Alkaline Phosphatase: 55 U/L (ref 39–117)
BUN: 24 mg/dL — ABNORMAL HIGH (ref 6–23)
CO2: 25 mEq/L (ref 19–32)
Calcium: 8.5 mg/dL (ref 8.4–10.5)
Chloride: 108 mEq/L (ref 96–112)
Creatinine, Ser: 0.83 mg/dL (ref 0.50–1.10)
GFR calc Af Amer: 82 mL/min — ABNORMAL LOW (ref >90)
GFR calc non Af Amer: 70 mL/min — ABNORMAL LOW (ref >90)
Glucose, Bld: 138 mg/dL — ABNORMAL HIGH (ref 70–99)
Potassium: 3.6 mEq/L (ref 3.5–5.1)
Sodium: 143 mEq/L (ref 135–145)
Total Bilirubin: 0.5 mg/dL (ref 0.3–1.2)
Total Protein: 6.5 g/dL (ref 6.0–8.3)

## 2011-06-05 LAB — CBC
HCT: 34.5 % — ABNORMAL LOW (ref 36.0–46.0)
Hemoglobin: 11.1 g/dL — ABNORMAL LOW (ref 12.0–15.0)
MCH: 31 pg (ref 26.0–34.0)
MCHC: 32.2 g/dL (ref 30.0–36.0)
MCV: 96.4 fL (ref 78.0–100.0)
Platelets: 212 10*3/uL (ref 150–400)
RBC: 3.58 MIL/uL — ABNORMAL LOW (ref 3.87–5.11)
RDW: 13.2 % (ref 11.5–15.5)
WBC: 8.3 10*3/uL (ref 4.0–10.5)

## 2011-06-05 MED ORDER — WHITE PETROLATUM GEL
Status: AC
  Administered 2011-06-05: 13:00:00
  Filled 2011-06-05: qty 5

## 2011-06-05 NOTE — Progress Notes (Signed)
Subjective: Burping a bit this morning, occasional cramps.  Has taken meds for nausea, but otherwise feels ok.  Objective: Vital signs in last 24 hours: Temp:  [98.2 F (36.8 C)-98.6 F (37 C)] 98.3 F (36.8 C) (12/18 0540) Pulse Rate:  [72-106] 106  (12/18 0540) Resp:  [16-24] 18  (12/18 0540) BP: (120-149)/(54-77) 134/56 mmHg (12/18 0540) SpO2:  [94 %-100 %] 95 % (12/18 0540) Weight:  [78.3 kg (172 lb 9.9 oz)-80.1 kg (176 lb 9.4 oz)] 172 lb 9.9 oz (78.3 kg) (12/18 0500) Weight change:  Last BM Date: 06/03/11  Intake/Output from previous day: 12/17 0701 - 12/18 0700 In: 989 [I.V.:789; IV Piggyback:200] Out: 750 [Urine:450; Emesis/NG output:300] Intake/Output this shift: Total I/O In: 1596.7 [I.V.:1596.7] Out: -   General appearance: alert and appears stated age Nose: Nares normal. Septum midline. Mucosa normal. No drainage or sinus tenderness., right turbinate with NG tube in place Resp: clear to auscultation bilaterally Cardio: regular rate and rhythm, S1, S2 normal, no murmur, click, rub or gallop GI: abnormal findings:  distended and hyperactive bowel sounds Neurologic: Grossly normal  Lab Results:  Basename 06/04/11 1956 06/04/11 1233  WBC 11.1* 13.0*  HGB 12.8 13.7  HCT 38.0 40.5  PLT 258 277   BMET  Basename 06/04/11 1956 06/04/11 1233  NA -- 140  K -- 4.2  CL -- 102  CO2 -- 22  GLUCOSE -- 136*  BUN -- 29*  CREATININE 0.88 0.86  CALCIUM -- 9.7    Studies/Results: Dg Chest 2 View  06/04/2011  *RADIOLOGY REPORT*  Clinical Data: Cough, weakness, nausea, vomiting.  CHEST - 2 VIEW  Comparison: 11/01/2009  Findings: Heart and mediastinal contours are within normal limits. No focal opacities or effusions.  No acute bony abnormality.  IMPRESSION: No active cardiopulmonary disease.  Original Report Authenticated By: Cyndie Chime, M.D.   Dg Abd 1 View  06/05/2011  *RADIOLOGY REPORT*  Clinical Data: Small bowel obstruction.  Abdominal pain, nausea.  ABDOMEN  - 1 VIEW  Comparison: 06/04/2011  Findings: Continued dilated small bowel loops in the abdomen and pelvis.  A small amount of contrast is seen in the right side of the colon.  NG tube is present in the stomach.  Small bowel dilatation may be slightly improved since prior study.  IMPRESSION: Continued small bowel obstruction pattern, slightly improved with NG tube in place.  Original Report Authenticated By: Cyndie Chime, M.D.   Ct Abdomen Pelvis W Contrast  06/04/2011  *RADIOLOGY REPORT*  Clinical Data: Vomiting, weakness.  CT ABDOMEN AND PELVIS WITH CONTRAST  Technique:  Multidetector CT imaging of the abdomen and pelvis was performed following the standard protocol during bolus administration of intravenous contrast.  Contrast: 80mL OMNIPAQUE IOHEXOL 300 MG/ML IV SOLN  Comparison: 08/24/2010  Findings: Lung bases are clear.  No effusions.  Heart is normal size.  Prior cholecystectomy.  Common bile duct is prominent, measuring up to 1.7 cm.  This likely is related to post cholecystectomy state, but recommend correlation with LFTs.  Liver, spleen, pancreas, adrenals, kidneys are normal.  There are dilated fluid-filled small bowel loops in the abdomen and pelvis. Findings compatible with small bowel obstruction. Transition point appears to be in the mid pelvis on image 58. No obstructing mass or lesion.  Therefore, findings presumably related to adhesions.  Distal small bowel and colon are decompressed and unremarkable.  There is a small amount of free fluid in the pelvis. Uterus and adnexa are unremarkable as is the urinary bladder.  No acute bony abnormality.  Aortic and iliac calcifications.  No aneurysm.  IMPRESSION: Small bowel obstruction.  Transition point is in the mid pelvis without visible cause, presumably adhesions.  Small amount of free fluid in the pelvis.  Original Report Authenticated By: Cyndie Chime, M.D.    Medications: I have reviewed the patient's current  medications.  Assessment/Plan: 1)  Small bowel obstruction:  Abdominal x-ray shows minimal change from yesterday.  Will continue NG suction and NPO with the hope that it will gradually start to decompress and resolve.  Has another of NG output from this AM.  Tolerating nausea reasonably well with IV Zofran. 2) HTN: Currently controlled off meds, held due to NPO 3) Anxiety:  Ativan PRN, SSRI held due to NPO Plan for another abd x-ray in AM to reevaluate.  Surgery to see later today and help with decision making regarding surgical need. Spoke with patient and her son at length this AM.  I am on vacation as of this AM and one of my partners at [facility] will be following her in my absence.    LOS: 1 day   Samyria Rudie W 06/05/2011, 8:27 AM

## 2011-06-05 NOTE — Progress Notes (Signed)
Subjective: Pt still in discomfort but slightly better. No flatus yet. Mild nausea.   Objective: Vital signs in last 24 hours: Temp:  [98.2 F (36.8 C)-98.6 F (37 C)] 98.3 F (36.8 C) (12/18 0540) Pulse Rate:  [72-106] 106  (12/18 0540) Resp:  [16-24] 18  (12/18 0540) BP: (120-149)/(54-77) 134/56 mmHg (12/18 0540) SpO2:  [94 %-100 %] 95 % (12/18 0540) Weight:  [172 lb 9.9 oz (78.3 kg)-176 lb 9.4 oz (80.1 kg)] 172 lb 9.9 oz (78.3 kg) (12/18 0500) Last BM Date: 06/03/11  Intake/Output this shift: Total I/O In: 1596.7 [I.V.:1596.7] Out: -   Physical Exam: BP 134/56  Pulse 106  Temp(Src) 98.3 F (36.8 C) (Oral)  Resp 18  Ht 5\' 5"  (1.651 m)  Wt 172 lb 9.9 oz (78.3 kg)  BMI 28.73 kg/m2  SpO2 95% NG output less bilious. Abdomen: mildly distended but soft, less tender than yesterday. Quiet BS  Labs: CBC  Basename 06/04/11 1956 06/04/11 1233  WBC 11.1* 13.0*  HGB 12.8 13.7  HCT 38.0 40.5  PLT 258 277   BMET  Basename 06/04/11 1956 06/04/11 1233  NA -- 140  K -- 4.2  CL -- 102  CO2 -- 22  GLUCOSE -- 136*  BUN -- 29*  CREATININE 0.88 0.86  CALCIUM -- 9.7   LFT  Basename 06/04/11 1233  PROT 8.2  ALBUMIN 4.3  AST 26  ALT 17  ALKPHOS 71  BILITOT 0.6  BILIDIR 0.1  IBILI 0.5  LIPASE 37   PT/INR No results found for this basename: LABPROT:2,INR:2 in the last 72 hours ABG No results found for this basename: PHART:2,PCO2:2,PO2:2,HCO3:2 in the last 72 hours  Studies/Results: Dg Chest 2 View  06/04/2011  *RADIOLOGY REPORT*  Clinical Data: Cough, weakness, nausea, vomiting.  CHEST - 2 VIEW  Comparison: 11/01/2009  Findings: Heart and mediastinal contours are within normal limits. No focal opacities or effusions.  No acute bony abnormality.  IMPRESSION: No active cardiopulmonary disease.  Original Report Authenticated By: Cyndie Chime, M.D.   Dg Abd 1 View  06/05/2011  *RADIOLOGY REPORT*  Clinical Data: Small bowel obstruction.  Abdominal pain,  nausea.  ABDOMEN - 1 VIEW  Comparison: 06/04/2011  Findings: Continued dilated small bowel loops in the abdomen and pelvis.  A small amount of contrast is seen in the right side of the colon.  NG tube is present in the stomach.  Small bowel dilatation may be slightly improved since prior study.  IMPRESSION: Continued small bowel obstruction pattern, slightly improved with NG tube in place.  Original Report Authenticated By: Cyndie Chime, M.D.   Ct Abdomen Pelvis W Contrast  06/04/2011  *RADIOLOGY REPORT*  Clinical Data: Vomiting, weakness.  CT ABDOMEN AND PELVIS WITH CONTRAST  Technique:  Multidetector CT imaging of the abdomen and pelvis was performed following the standard protocol during bolus administration of intravenous contrast.  Contrast: 80mL OMNIPAQUE IOHEXOL 300 MG/ML IV SOLN  Comparison: 08/24/2010  Findings: Lung bases are clear.  No effusions.  Heart is normal size.  Prior cholecystectomy.  Common bile duct is prominent, measuring up to 1.7 cm.  This likely is related to post cholecystectomy state, but recommend correlation with LFTs.  Liver, spleen, pancreas, adrenals, kidneys are normal.  There are dilated fluid-filled small bowel loops in the abdomen and pelvis. Findings compatible with small bowel obstruction. Transition point appears to be in the mid pelvis on image 58. No obstructing mass or lesion.  Therefore, findings presumably related to adhesions.  Distal  small bowel and colon are decompressed and unremarkable.  There is a small amount of free fluid in the pelvis. Uterus and adnexa are unremarkable as is the urinary bladder.  No acute bony abnormality.  Aortic and iliac calcifications.  No aneurysm.  IMPRESSION: Small bowel obstruction.  Transition point is in the mid pelvis without visible cause, presumably adhesions.  Small amount of free fluid in the pelvis.  Original Report Authenticated By: Cyndie Chime, M.D.    Assessment: Principal Problem:  *SBO (small bowel  obstruction) X-ray slightly better with NG decompression  Plan: Cont NG/bowel rest Encouraged OOB/walking. Recheck x-ray in am. D/W family possibility for surgery if no improvement.  LOS: 1 day    Samantha [name] 06/05/2011

## 2011-06-06 ENCOUNTER — Inpatient Hospital Stay (HOSPITAL_COMMUNITY): Payer: Medicare Other

## 2011-06-06 LAB — CBC
HCT: 33.6 % — ABNORMAL LOW (ref 36.0–46.0)
Hemoglobin: 10.7 g/dL — ABNORMAL LOW (ref 12.0–15.0)
MCH: 30.8 pg (ref 26.0–34.0)
MCHC: 31.8 g/dL (ref 30.0–36.0)
MCV: 96.8 fL (ref 78.0–100.0)
Platelets: 189 10*3/uL (ref 150–400)
RBC: 3.47 MIL/uL — ABNORMAL LOW (ref 3.87–5.11)
RDW: 13 % (ref 11.5–15.5)
WBC: 6.6 10*3/uL (ref 4.0–10.5)

## 2011-06-06 LAB — COMPREHENSIVE METABOLIC PANEL
ALT: 25 U/L (ref 0–35)
AST: 26 U/L (ref 0–37)
Albumin: 3.3 g/dL — ABNORMAL LOW (ref 3.5–5.2)
Alkaline Phosphatase: 52 U/L (ref 39–117)
BUN: 19 mg/dL (ref 6–23)
CO2: 24 mEq/L (ref 19–32)
Calcium: 8.3 mg/dL — ABNORMAL LOW (ref 8.4–10.5)
Chloride: 108 mEq/L (ref 96–112)
Creatinine, Ser: 0.72 mg/dL (ref 0.50–1.10)
GFR calc Af Amer: >90 mL/min (ref >90)
GFR calc non Af Amer: 86 mL/min — ABNORMAL LOW (ref >90)
Glucose, Bld: 89 mg/dL (ref 70–99)
Potassium: 3.8 mEq/L (ref 3.5–5.1)
Sodium: 141 mEq/L (ref 135–145)
Total Bilirubin: 0.3 mg/dL (ref 0.3–1.2)
Total Protein: 6.1 g/dL (ref 6.0–8.3)

## 2011-06-06 NOTE — Progress Notes (Signed)
Seen and agree  

## 2011-06-06 NOTE — Progress Notes (Signed)
Subjective: Patient denies any pain, denies nausea, NG tube continues to be in place, bowel movement late yesterday morning abdominal x-ray pending this morning. Has not had flatus yet this morning. In no apparent distress  Objective: Vital signs in last 24 hours: Temp:  [98.1 F (36.7 C)-98.5 F (36.9 C)] 98.1 F (36.7 C) (12/19 0600) Pulse Rate:  [79-87] 87  (12/19 0600) Resp:  [18-19] 18  (12/19 0600) BP: (119-134)/(48-56) 134/56 mmHg (12/19 0600) SpO2:  [96 %-100 %] 97 % (12/19 0600) Weight:  [80.1 kg (176 lb 9.4 oz)] 176 lb 9.4 oz (80.1 kg) (12/19 0600) Weight change: 0 kg (0 lb) Last BM Date: 06/05/11  CBG (last 3)  No results found for this basename: GLUCAP:3 in the last 72 hours  Intake/Output from previous day: 12/18 0701 - 12/19 0700 In: 3571.7 [I.V.:3171.7; IV Piggyback:400] Out: 1150 [Urine:1000; Emesis/NG output:150] Intake/Output this shift:    General appearance: alert, cooperative and no distress Eyes: no scleral icterus Throat: oropharynx moist without erythema Resp: clear to auscultation bilaterally Cardio: regular rate and rhythm Abdomen soft, bowel sounds heard however unclear whether this is related to the NG tube, nontender Extremities: no clubbing, cyanosis or edema   Lab Results:  [facility] 06/06/11 0530 06/05/11 1104  NA 141 143  K 3.8 3.6  CL 108 108  CO2 24 25  GLUCOSE 89 138*  BUN 19 24*  CREATININE 0.72 0.83  CALCIUM 8.3* 8.5  MG -- --  PHOS -- --    Basename 06/06/11 0530 06/05/11 1104  AST 26 28  ALT 25 27  ALKPHOS 52 55  BILITOT 0.3 0.5  PROT 6.1 6.5  ALBUMIN 3.3* 3.4*    Basename 06/06/11 0530 06/05/11 1104 06/04/11 1233  WBC 6.6 8.3 --  NEUTROABS -- -- 10.0*  HGB 10.7* 11.1* --  HCT 33.6* 34.5* --  MCV 96.8 96.4 --  PLT 189 212 --   No results found for this basename: CKTOTAL:3,CKMB:3,CKMBINDEX:3,TROPONINI:3 in the last 72 hours No results found for this basename: TSH,T4TOTAL,FREET3,T3FREE,THYROIDAB in the last 72  hours No results found for this basename: VITAMINB12:2,FOLATE:2,FERRITIN:2,TIBC:2,IRON:2,RETICCTPCT:2 in the last 72 hours  Studies/Results: Dg Chest 2 View  06/04/2011  *RADIOLOGY REPORT*  Clinical Data: Cough, weakness, nausea, vomiting.  CHEST - 2 VIEW  Comparison: 11/01/2009  Findings: Heart and mediastinal contours are within normal limits. No focal opacities or effusions.  No acute bony abnormality.  IMPRESSION: No active cardiopulmonary disease.  Original Report Authenticated By: Cyndie Chime, M.D.   Dg Abd 1 View  06/05/2011  *RADIOLOGY REPORT*  Clinical Data: Small bowel obstruction.  Abdominal pain, nausea.  ABDOMEN - 1 VIEW  Comparison: 06/04/2011  Findings: Continued dilated small bowel loops in the abdomen and pelvis.  A small amount of contrast is seen in the right side of the colon.  NG tube is present in the stomach.  Small bowel dilatation may be slightly improved since prior study.  IMPRESSION: Continued small bowel obstruction pattern, slightly improved with NG tube in place.  Original Report Authenticated By: Cyndie Chime, M.D.   Ct Abdomen Pelvis W Contrast  06/04/2011  *RADIOLOGY REPORT*  Clinical Data: Vomiting, weakness.  CT ABDOMEN AND PELVIS WITH CONTRAST  Technique:  Multidetector CT imaging of the abdomen and pelvis was performed following the standard protocol during bolus administration of intravenous contrast.  Contrast: 80mL OMNIPAQUE IOHEXOL 300 MG/ML IV SOLN  Comparison: 08/24/2010  Findings: Lung bases are clear.  No effusions.  Heart is normal size.  Prior  cholecystectomy.  Common bile duct is prominent, measuring up to 1.7 cm.  This likely is related to post cholecystectomy state, but recommend correlation with LFTs.  Liver, spleen, pancreas, adrenals, kidneys are normal.  There are dilated fluid-filled small bowel loops in the abdomen and pelvis. Findings compatible with small bowel obstruction. Transition point appears to be in the mid pelvis on image 58. No  obstructing mass or lesion.  Therefore, findings presumably related to adhesions.  Distal small bowel and colon are decompressed and unremarkable.  There is a small amount of free fluid in the pelvis. Uterus and adnexa are unremarkable as is the urinary bladder.  No acute bony abnormality.  Aortic and iliac calcifications.  No aneurysm.  IMPRESSION: Small bowel obstruction.  Transition point is in the mid pelvis without visible cause, presumably adhesions.  Small amount of free fluid in the pelvis.  Original Report Authenticated By: Cyndie Chime, M.D.     Medications: Scheduled:   . antiseptic oral rinse  15 mL Mouth Rinse q12n4p  . chlorhexidine  15 mL Mouth Rinse BID  . ciprofloxacin  400 mg Intravenous Q12H  . enoxaparin  40 mg Subcutaneous Q24H  . white petrolatum       Continuous:   . sodium chloride 1,000 mL (06/04/11 1817)  . 0.9 % NaCl with KCl 20 mEq / L 75 mL/hr at 06/05/11 2121    Assessment/Plan: Principal Problem:  *SBO (small bowel obstruction)-abdominal series pending for this morning, large bowel movement late yesterday morning, patient no apparent distress however is still has output coming from her NG tube. Unclear whether she would benefit from clamping of the NG tube, will defer to surgery and will be seeing the patient later todaypresumably after her abdominal series.  Hypertension controlled off of medications Anxiety being treated with as needed Ativan, n.p.o. next disposition pending further evaluation of abdominal series, removal of NG tube and proper functioning of bowel habits.    LOS: 2 days   Shiniqua Groseclose R 06/06/2011, 7:26 AM

## 2011-06-06 NOTE — Progress Notes (Signed)
  Subjective: Pt ok. BM yesterday afternoon.   Objective: Vital signs in last 24 hours: Temp:  [98.1 F (36.7 C)-98.5 F (36.9 C)] 98.1 F (36.7 C) (12/19 0600) Pulse Rate:  [79-87] 87  (12/19 0600) Resp:  [18-19] 18  (12/19 0600) BP: (119-134)/(48-56) 134/56 mmHg (12/19 0600) SpO2:  [96 %-100 %] 97 % (12/19 0600) Weight:  [80.1 kg (176 lb 9.4 oz)] 176 lb 9.4 oz (80.1 kg) (12/19 0600) Last BM Date: 06/05/11  Intake/Output this shift:    Physical Exam: BP 134/56  Pulse 87  Temp(Src) 98.1 F (36.7 C) (Oral)  Resp 18  Ht 5\' 5"  (1.651 m)  Wt 80.1 kg (176 lb 9.4 oz)  BMI 29.39 kg/m2  SpO2 97% NG output down and non-bilious. Abdomen; softer, less tender, no masses Active BS  Labs: CBC  Basename 06/06/11 0530 06/05/11 1104  WBC 6.6 8.3  HGB 10.7* 11.1*  HCT 33.6* 34.5*  PLT 189 212   BMET  Basename 06/06/11 0530 06/05/11 1104  NA 141 143  K 3.8 3.6  CL 108 108  CO2 24 25  GLUCOSE 89 138*  BUN 19 24*  CREATININE 0.72 0.83  CALCIUM 8.3* 8.5   LFT  Basename 06/06/11 0530 06/04/11 1233  PROT 6.1 --  ALBUMIN 3.3* --  AST 26 --  ALT 25 --  ALKPHOS 52 --  BILITOT 0.3 --  BILIDIR -- 0.1  IBILI -- 0.5  LIPASE -- 37   PT/INR No results found for this basename: LABPROT:2,INR:2 in the last 72 hours ABG No results found for this basename: PHART:2,PCO2:2,PO2:2,HCO3:2 in the last 72 hours  Studies/Results: Abd X-ray: await report but looks much better, contrast into colon.   Assessment: Principal Problem:  *SBO (small bowel obstruction)  Plan: Clamp NG this am, hopefully DC later today Sips of clears later, Encouraged OOB/walking  LOS: 2 days    Marianna Fuss 06/06/2011

## 2011-06-06 NOTE — Progress Notes (Signed)
06-06-11 UR completed. Ronny Flurry RN BSN

## 2011-06-07 ENCOUNTER — Inpatient Hospital Stay (HOSPITAL_COMMUNITY): Payer: Medicare Other

## 2011-06-07 LAB — CBC
HCT: 33.1 % — ABNORMAL LOW (ref 36.0–46.0)
Hemoglobin: 10.8 g/dL — ABNORMAL LOW (ref 12.0–15.0)
MCH: 31 pg (ref 26.0–34.0)
MCHC: 32.6 g/dL (ref 30.0–36.0)
MCV: 95.1 fL (ref 78.0–100.0)
Platelets: 195 10*3/uL (ref 150–400)
RBC: 3.48 MIL/uL — ABNORMAL LOW (ref 3.87–5.11)
RDW: 12.6 % (ref 11.5–15.5)
WBC: 5 10*3/uL (ref 4.0–10.5)

## 2011-06-07 LAB — COMPREHENSIVE METABOLIC PANEL
ALT: 26 U/L (ref 0–35)
AST: 25 U/L (ref 0–37)
Albumin: 3.3 g/dL — ABNORMAL LOW (ref 3.5–5.2)
Alkaline Phosphatase: 57 U/L (ref 39–117)
BUN: 12 mg/dL (ref 6–23)
CO2: 24 mEq/L (ref 19–32)
Calcium: 8.7 mg/dL (ref 8.4–10.5)
Chloride: 107 mEq/L (ref 96–112)
Creatinine, Ser: 0.75 mg/dL (ref 0.50–1.10)
GFR calc Af Amer: >90 mL/min (ref >90)
GFR calc non Af Amer: 84 mL/min — ABNORMAL LOW (ref >90)
Glucose, Bld: 89 mg/dL (ref 70–99)
Potassium: 3.6 mEq/L (ref 3.5–5.1)
Sodium: 141 mEq/L (ref 135–145)
Total Bilirubin: 0.4 mg/dL (ref 0.3–1.2)
Total Protein: 6.3 g/dL (ref 6.0–8.3)

## 2011-06-07 NOTE — Progress Notes (Signed)
Subjective: Patient easily arousable alert, oriented, no apparent distress, NG tube out, has finished 1-1/2 cans of soda within the last 24 hours, no nausea, positive flatus. No abdominal pain. Tolerating clear fluids.  Objective: Vital signs in last 24 hours: Temp:  [97.4 F (36.3 C)-98.5 F (36.9 C)] 98.5 F (36.9 C) 2023-06-07 2203) Pulse Rate:  [72] 72  06-07-23 2203) Resp:  [17-18] 18  June 07, 2023 2203) BP: (120-129)/(46-56) 129/56 mmHg 06/07/23 2203) SpO2:  [97 %-98 %] 98 % 06-07-23 2203) Weight change:  Last BM Date: 06/05/11  CBG (last 3)  No results found for this basename: GLUCAP:3 in the last 72 hours  Intake/Output from previous day: 06-07-23 0701 - 12/20 0700 In: 1917.5 [I.V.:1917.5] Out: -  Intake/Output this shift: Total I/O In: 1917.5 [I.V.:1917.5] Out: -   General appearance: alert, cooperative and no distress Eyes: no scleral icterus Throat: oropharynx moist without erythema Resp: clear to auscultation bilaterally Cardio: regular rate and rhythm Abdomen soft, bowel sounds heardnontender Extremities: no clubbing, cyanosis or edema   Lab Results:  [facility] 06/07/2011 0530 06/05/11 1104  NA 141 143  K 3.8 3.6  CL 108 108  CO2 24 25  GLUCOSE 89 138*  BUN 19 24*  CREATININE 0.72 0.83  CALCIUM 8.3* 8.5  MG -- --  PHOS -- --    Basename 06-07-11 0530 06/05/11 1104  AST 26 28  ALT 25 27  ALKPHOS 52 55  BILITOT 0.3 0.5  PROT 6.1 6.5  ALBUMIN 3.3* 3.4*    Basename 06/07/11 0530 06/05/11 1104 06/04/11 1233  WBC 6.6 8.3 --  NEUTROABS -- -- 10.0*  HGB 10.7* 11.1* --  HCT 33.6* 34.5* --  MCV 96.8 96.4 --  PLT 189 212 --   No results found for this basename: CKTOTAL:3,CKMB:3,CKMBINDEX:3,TROPONINI:3 in the last 72 hours No results found for this basename: TSH,T4TOTAL,FREET3,T3FREE,THYROIDAB in the last 72 hours No results found for this basename: VITAMINB12:2,FOLATE:2,FERRITIN:2,TIBC:2,IRON:2,RETICCTPCT:2 in the last 72 hours  Studies/Results: Dg Abd 1  View  06-07-2011  *RADIOLOGY REPORT*  Clinical Data: Obstruction.  ABDOMEN - 1 VIEW  Comparison: 05/1979/2012  Findings: Nasogastric tube appears to extend into the proximal stomach.  There is some further transit of oral contrast material into the colon.  Degree of small bowel dilatation appears improved consistent with resolving partial small bowel obstruction.  IMPRESSION: Improvement radiographically with evidence of resolving partial small bowel obstruction.  Original Report Authenticated By: Reola Calkins, M.D.     Medications: Scheduled:    . antiseptic oral rinse  15 mL Mouth Rinse q12n4p  . chlorhexidine  15 mL Mouth Rinse BID  . ciprofloxacin  400 mg Intravenous Q12H  . enoxaparin  40 mg Subcutaneous Q24H   Continuous:    . sodium chloride 1,000 mL (06/04/11 1817)  . 0.9 % NaCl with KCl 20 mEq / L 75 mL/hr at 06/07/11 0981    Assessment/Plan: Principal Problem:  *SBO (small bowel obstruction)-abdominal series from yesterday morning improved, NG tube clamped, and eventually removed. Patient tolerating clear fluids, soda, with positive flatus, no nausea, vomiting or abdominal pain. Will be to advance diet and if she tolerates this we'll discharge within the next 24 hours. Patient does live alone.  Hypertension controlled off of medications Anxiety being treated with as needed Ativan,     LOS: 3 days   Aristotle Lieb R 06/07/2011, 6:58 AM

## 2011-06-07 NOTE — Progress Notes (Signed)
  Subjective: Pt ok. NG out, tolerating some clears, denies nausea. Flatus, no BM yet. Denies pain  Objective: Vital signs in last 24 hours: Temp:  [97.4 F (36.3 C)-98.5 F (36.9 C)] 97.7 F (36.5 C) (12/20 0700) Pulse Rate:  [65-72] 65  (12/20 0700) Resp:  [17-18] 18  (12/20 0700) BP: (120-129)/(46-56) 121/52 mmHg (12/20 0700) SpO2:  [97 %-98 %] 98 % (12/20 0700) Weight:  [78.472 kg (173 lb)] 173 lb (78.472 kg) (12/20 0700) Last BM Date: 06/05/11  Intake/Output this shift:    Physical Exam: BP 121/52  Pulse 65  Temp(Src) 97.7 F (36.5 C) (Oral)  Resp 18  Ht 5\' 5"  (1.651 m)  Wt 78.472 kg (173 lb)  BMI 28.79 kg/m2  SpO2 98% Abdomen: soft, ND, active BS  Labs: CBC  Basename 06/07/11 0700 06/06/11 0530  WBC 5.0 6.6  HGB 10.8* 10.7*  HCT 33.1* 33.6*  PLT 195 189   BMET  Basename 06/06/11 0530 06/05/11 1104  NA 141 143  K 3.8 3.6  CL 108 108  CO2 24 25  GLUCOSE 89 138*  BUN 19 24*  CREATININE 0.72 0.83  CALCIUM 8.3* 8.5   LFT  Basename 06/06/11 0530 06/04/11 1233  PROT 6.1 --  ALBUMIN 3.3* --  AST 26 --  ALT 25 --  ALKPHOS 52 --  BILITOT 0.3 --  BILIDIR -- 0.1  IBILI -- 0.5  LIPASE -- 37   PT/INR No results found for this basename: LABPROT:2,INR:2 in the last 72 hours ABG No results found for this basename: PHART:2,PCO2:2,PO2:2,HCO3:2 in the last 72 hours  Studies/Results: Dg Abd 1 View  06/06/2011  *RADIOLOGY REPORT*  Clinical Data: Obstruction.  ABDOMEN - 1 VIEW  Comparison: 05/1979/2012  Findings: Nasogastric tube appears to extend into the proximal stomach.  There is some further transit of oral contrast material into the colon.  Degree of small bowel dilatation appears improved consistent with resolving partial small bowel obstruction.  IMPRESSION: Improvement radiographically with evidence of resolving partial small bowel obstruction.  Original Report Authenticated By: Reola Calkins, M.D.   Dg Abd 2 Views  06/07/2011  *RADIOLOGY  REPORT*  Clinical Data: History of small bowel obstruction.  ABDOMEN - 2 VIEW  Comparison: Plain film the abdomen 06/06/2011.  Findings: There has been some progression of contrast material through the colon.  There is persistent mild dilatation of small bowel with loops measuring up to 3.2 cm.  No free intraperitoneal air is identified.  IMPRESSION: No marked change in small bowel obstruction.  Original Report Authenticated By: Bernadene Bell. Maricela Curet, M.D.    Assessment: Principal Problem:  *SBO (small bowel obstruction)  Plan: WOuld start with clears first, then advance from there. Encouraged OOB/walking  LOS: 3 days    Samantha [name] 06/07/2011

## 2011-06-08 LAB — CBC
HCT: 32.2 % — ABNORMAL LOW (ref 36.0–46.0)
Hemoglobin: 10.7 g/dL — ABNORMAL LOW (ref 12.0–15.0)
MCH: 31.5 pg (ref 26.0–34.0)
MCHC: 33.2 g/dL (ref 30.0–36.0)
MCV: 94.7 fL (ref 78.0–100.0)
Platelets: 194 10*3/uL (ref 150–400)
RBC: 3.4 MIL/uL — ABNORMAL LOW (ref 3.87–5.11)
RDW: 12.5 % (ref 11.5–15.5)
WBC: 4.6 10*3/uL (ref 4.0–10.5)

## 2011-06-08 LAB — COMPREHENSIVE METABOLIC PANEL
ALT: 28 U/L (ref 0–35)
AST: 25 U/L (ref 0–37)
Albumin: 3.2 g/dL — ABNORMAL LOW (ref 3.5–5.2)
Alkaline Phosphatase: 52 U/L (ref 39–117)
BUN: 10 mg/dL (ref 6–23)
CO2: 24 mEq/L (ref 19–32)
Calcium: 8.7 mg/dL (ref 8.4–10.5)
Chloride: 109 mEq/L (ref 96–112)
Creatinine, Ser: 0.74 mg/dL (ref 0.50–1.10)
GFR calc Af Amer: >90 mL/min (ref >90)
GFR calc non Af Amer: 85 mL/min — ABNORMAL LOW (ref >90)
Glucose, Bld: 97 mg/dL (ref 70–99)
Potassium: 3.9 mEq/L (ref 3.5–5.1)
Sodium: 141 mEq/L (ref 135–145)
Total Bilirubin: 0.4 mg/dL (ref 0.3–1.2)
Total Protein: 5.9 g/dL — ABNORMAL LOW (ref 6.0–8.3)

## 2011-06-08 NOTE — Progress Notes (Signed)
  Subjective: Pt ok. Feeling better this am. No n/v. Tolerating clears and asking for more. Wants to go home. Passing flatus, no BM  Objective: Vital signs in last 24 hours: Temp:  [98.1 F (36.7 C)-98.5 F (36.9 C)] 98.5 F (36.9 C) (12/21 0600) Pulse Rate:  [65-80] 65  (12/21 0600) Resp:  [18-20] 20  (12/21 0600) BP: (107-141)/(52-71) 107/52 mmHg (12/21 0600) SpO2:  [96 %-99 %] 96 % (12/21 0600) Weight:  [79.2 kg (174 lb 9.7 oz)] 174 lb 9.7 oz (79.2 kg) (12/21 0600) Last BM Date: 06/05/11  Intake/Output this shift:    Physical Exam: BP 107/52  Pulse 65  Temp(Src) 98.5 F (36.9 C) (Oral)  Resp 20  Ht 5\' 5"  (1.651 m)  Wt 79.2 kg (174 lb 9.7 oz)  BMI 29.06 kg/m2  SpO2 96% Abdomen: soft, ND, active BS  Labs: CBC  Basename 06/08/11 0509 07/01/11 0700  WBC 4.6 5.0  HGB 10.7* 10.8*  HCT 32.2* 33.1*  PLT 194 195   BMET  Basename 06/08/11 0509 Jul 01, 2011 0700  NA 141 141  K 3.9 3.6  CL 109 107  CO2 24 24  GLUCOSE 97 89  BUN 10 12  CREATININE 0.74 0.75  CALCIUM 8.7 8.7   LFT  Basename 06/08/11 0509  PROT 5.9*  ALBUMIN 3.2*  AST 25  ALT 28  ALKPHOS 52  BILITOT 0.4  BILIDIR --  IBILI --  LIPASE --   PT/INR No results found for this basename: LABPROT:2,INR:2 in the last 72 hours ABG No results found for this basename: PHART:2,PCO2:2,PO2:2,HCO3:2 in the last 72 hours  Studies/Results: Dg Abd 2 Views  07-01-2011  *RADIOLOGY REPORT*  Clinical Data: History of small bowel obstruction.  ABDOMEN - 2 VIEW  Comparison: Plain film the abdomen 06/06/2011.  Findings: There has been some progression of contrast material through the colon.  There is persistent mild dilatation of small bowel with loops measuring up to 3.2 cm.  No free intraperitoneal air is identified.  IMPRESSION: No marked change in small bowel obstruction.  Original Report Authenticated By: Bernadene Bell. Maricela Curet, M.D.    Assessment: Principal Problem:  *SBO (small bowel obstruction)      Plan: Advance diet, encourage OOB. Agree if tolerates advanced diet, can DC home later today.  LOS: 4 days    Marianna Fuss 06/08/2011

## 2011-06-08 NOTE — Progress Notes (Signed)
Subjective: Patient easily arousable alert, oriented, no apparent distress, NG tube out, by patient report taking 5-6 ounces of Jell-O per meal but no more, minimal other fluid intake, no nausea, positive flatus. No abdominal pain. Tolerating clear fluids.  Objective: Vital signs in last 24 hours: Temp:  [98.1 F (36.7 C)-98.5 F (36.9 C)] 98.5 F (36.9 C) (12/21 0600) Pulse Rate:  [65-80] 65  (12/21 0600) Resp:  [18-20] 20  (12/21 0600) BP: (107-141)/(52-71) 107/52 mmHg (12/21 0600) SpO2:  [96 %-99 %] 96 % (12/21 0600) Weight:  [79.2 kg (174 lb 9.7 oz)] 174 lb 9.7 oz (79.2 kg) (12/21 0600) Weight change: 0.728 kg (1 lb 9.7 oz) Last BM Date: 06/05/11  CBG (last 3)  No results found for this basename: GLUCAP:3 in the last 72 hours  Intake/Output from previous day: 12/20 0701 - 12/21 0700 In: 1964.5 [I.V.:1964.5] Out: 900 [Urine:900] Intake/Output this shift:    General appearance: alert, cooperative and no distress Eyes: no scleral icterus Throat: oropharynx moist without erythema Resp: clear to auscultation bilaterally Cardio: regular rate and rhythm Abdomen soft, bowel sounds heardnontender Extremities: no clubbing, cyanosis or edema   Lab Results:  [facility] 06/08/11 0509 06/07/11 0700  NA 141 141  K 3.9 3.6  CL 109 107  CO2 24 24  GLUCOSE 97 89  BUN 10 12  CREATININE 0.74 0.75  CALCIUM 8.7 8.7  MG -- --  PHOS -- --    Basename 06/08/11 0509 06/07/11 0700  AST 25 25  ALT 28 26  ALKPHOS 52 57  BILITOT 0.4 0.4  PROT 5.9* 6.3  ALBUMIN 3.2* 3.3*    Basename 06/08/11 0509 06/07/11 0700  WBC 4.6 5.0  NEUTROABS -- --  HGB 10.7* 10.8*  HCT 32.2* 33.1*  MCV 94.7 95.1  PLT 194 195   No results found for this basename: CKTOTAL:3,CKMB:3,CKMBINDEX:3,TROPONINI:3 in the last 72 hours No results found for this basename: TSH,T4TOTAL,FREET3,T3FREE,THYROIDAB in the last 72 hours No results found for this basename:  VITAMINB12:2,FOLATE:2,FERRITIN:2,TIBC:2,IRON:2,RETICCTPCT:2 in the last 72 hours  Studies/Results: Dg Abd 1 View  06/06/2011  *RADIOLOGY REPORT*  Clinical Data: Obstruction.  ABDOMEN - 1 VIEW  Comparison: 05/1979/2012  Findings: Nasogastric tube appears to extend into the proximal stomach.  There is some further transit of oral contrast material into the colon.  Degree of small bowel dilatation appears improved consistent with resolving partial small bowel obstruction.  IMPRESSION: Improvement radiographically with evidence of resolving partial small bowel obstruction.  Original Report Authenticated By: Reola Calkins, M.D.   Dg Abd 2 Views  06/07/2011  *RADIOLOGY REPORT*  Clinical Data: History of small bowel obstruction.  ABDOMEN - 2 VIEW  Comparison: Plain film the abdomen 06/06/2011.  Findings: There has been some progression of contrast material through the colon.  There is persistent mild dilatation of small bowel with loops measuring up to 3.2 cm.  No free intraperitoneal air is identified.  IMPRESSION: No marked change in small bowel obstruction.  Original Report Authenticated By: Bernadene Bell. Maricela Curet, M.D.     Medications: Scheduled:    . antiseptic oral rinse  15 mL Mouth Rinse q12n4p  . chlorhexidine  15 mL Mouth Rinse BID  . ciprofloxacin  400 mg Intravenous Q12H  . enoxaparin  40 mg Subcutaneous Q24H   Continuous:    . sodium chloride 1,000 mL (06/04/11 1817)  . 0.9 % NaCl with KCl 20 mEq / L 75 mL/hr at 06/07/11 2025    Assessment/Plan: Principal Problem:  *SBO (small bowel  obstruction)-abdominal series from yesterday morning stable,, NG tube out. Patient tolerating clear fluids, soda, with positive flatus, no nausea, vomiting or abdominal pain. Diet not advanced past clear his yesterday, will advance now, I will encourage patient to force fluids, positive flatus, patient lives alone and it is unclear when she will have family members able to assist her. If she remains a  remarkable recovery today we'll try to discharge later on tonight otherwise will have to be over the weekend if she improves as expected.. Patient does live alone.  Hypertension controlled off of medications Anxiety being treated with as needed Ativan,     LOS: 4 days   Sayre Witherington R 06/08/2011, 7:17 AM

## 2011-06-09 LAB — COMPREHENSIVE METABOLIC PANEL
ALT: 31 U/L (ref 0–35)
AST: 27 U/L (ref 0–37)
Albumin: 3.4 g/dL — ABNORMAL LOW (ref 3.5–5.2)
Alkaline Phosphatase: 62 U/L (ref 39–117)
BUN: 10 mg/dL (ref 6–23)
CO2: 23 mEq/L (ref 19–32)
Calcium: 9.2 mg/dL (ref 8.4–10.5)
Chloride: 108 mEq/L (ref 96–112)
Creatinine, Ser: 0.79 mg/dL (ref 0.50–1.10)
GFR calc Af Amer: >90 mL/min (ref >90)
GFR calc non Af Amer: 83 mL/min — ABNORMAL LOW (ref >90)
Glucose, Bld: 102 mg/dL — ABNORMAL HIGH (ref 70–99)
Potassium: 3.6 mEq/L (ref 3.5–5.1)
Sodium: 141 mEq/L (ref 135–145)
Total Bilirubin: 0.4 mg/dL (ref 0.3–1.2)
Total Protein: 6.2 g/dL (ref 6.0–8.3)

## 2011-06-09 LAB — CBC
HCT: 33.1 % — ABNORMAL LOW (ref 36.0–46.0)
Hemoglobin: 10.9 g/dL — ABNORMAL LOW (ref 12.0–15.0)
MCH: 31.1 pg (ref 26.0–34.0)
MCHC: 32.9 g/dL (ref 30.0–36.0)
MCV: 94.6 fL (ref 78.0–100.0)
Platelets: 203 10*3/uL (ref 150–400)
RBC: 3.5 MIL/uL — ABNORMAL LOW (ref 3.87–5.11)
RDW: 12.7 % (ref 11.5–15.5)
WBC: 3.7 10*3/uL — ABNORMAL LOW (ref 4.0–10.5)

## 2011-06-09 NOTE — Progress Notes (Signed)
1650, 06/09/11, home meds gone over, home care instructions gone over. Follow up appoints to be made. Patient verbalized understanding of all. Discharged home

## 2011-06-09 NOTE — Progress Notes (Signed)
  Subjective: Feels good. Had a BM late yesterday. Ate reg breakfast this am. No N/V  Objective: Vital signs in last 24 hours: Temp:  [97.5 F (36.4 C)-98.3 F (36.8 C)] 97.5 F (36.4 C) (12/22 1610) Pulse Rate:  [67-81] 81  (12/22 0614) Resp:  [18] 18  (12/22 0614) BP: (111-144)/(53-63) 111/55 mmHg (12/22 0614) SpO2:  [96 %-100 %] 96 % (12/22 0614) Weight:  [172 lb 11.2 oz (78.336 kg)] 172 lb 11.2 oz (78.336 kg) (12/22 0614) Last BM Date: 06/08/11  Intake/Output this shift:    Physical Exam: BP 111/55  Pulse 81  Temp(Src) 97.5 F (36.4 C) (Oral)  Resp 18  Ht 5\' 5"  (1.651 m)  Wt 172 lb 11.2 oz (78.336 kg)  BMI 28.74 kg/m2  SpO2 96% Abdomen: soft, ND, NT  Labs: CBC  Basename 06/09/11 0722 06/08/11 0509  WBC 3.7* 4.6  HGB 10.9* 10.7*  HCT 33.1* 32.2*  PLT 203 194   BMET  Basename 06/09/11 0722 06/08/11 0509  NA 141 141  K 3.6 3.9  CL 108 109  CO2 23 24  GLUCOSE 102* 97  BUN 10 10  CREATININE 0.79 0.74  CALCIUM 9.2 8.7   LFT  Basename 06/09/11 0722  PROT 6.2  ALBUMIN 3.4*  AST 27  ALT 31  ALKPHOS 62  BILITOT 0.4  BILIDIR --  IBILI --  LIPASE --   PT/INR No results found for this basename: LABPROT:2,INR:2 in the last 72 hours ABG No results found for this basename: PHART:2,PCO2:2,PO2:2,HCO3:2 in the last 72 hours  Studies/Results: No results found.  Assessment: Principal Problem:  *SBO (small bowel obstruction)     Plan: OK to DC home.  LOS: 5 days    Jacquelina Hewins J 06/09/2011  Feels ok. Ate breakfast. Had a BM

## 2011-06-09 NOTE — Discharge Summary (Signed)
Physician Discharge Summary  Patient ID: Samantha [name] MRN: [id number] DOB/AGE: [birth year] 70 y.o.  Admit date: 06/04/2011 Discharge date: 06/09/2011   Discharge Diagnoses:  Principal Problem:  *SBO (small bowel obstruction)   Discharged Condition: good  Hospital Course: The patient is a 70 year old Caucasian woman with a history of rectal cancer and prior abdominal surgeries who presented to the emergency room with complaint of several days of nausea and vomiting. As an outpatient she was treated for urinary tract infection, but was unable to keep her ciprofloxacin down. Her emergency room evaluation was significant for x-rays did show distention of the abdomen with a CT scan of the abdomen consistent with small bowel obstruction with no clear focal obstruction. She was admitted for further evaluation. She did well with nasogastric suction from above and IV fluids to maintain hydration status. She did not require surgical intervention. She did well with gradual resumption of normal diet, and on the day of discharge was able to consume a normal breakfast and lunch without difficulty. She also had a normal bowel movement on the day of discharge. She was no longer having any nausea, vomiting, constipation, and denied diarrhea or fever or chills. Her only procedure was the CT scan of the abdomen and pelvis done with oral and IV contrast which showed a small bowel obstruction with a transition point in the mid pelvis without visible cause, resume only adhesions, and there was a small amount of free fluid in the pelvis. Incidental note was made of a prominent common bile duct that was likely a post cholecystectomy state, and it was recommended this be correlated with liver associated enzymes. The only consultant was from general surgery. There were no complications of this hospitalization.  Consults: general surgery  Significant Diagnostic Studies:  No results found.  Labs: Lab Results  Component  Value Date   WBC 3.7* 06/09/2011   HGB 10.9* 06/09/2011   HCT 33.1* 06/09/2011   MCV 94.6 06/09/2011   PLT 203 06/09/2011     Lab 06/09/11 0722  NA 141  K 3.6  CL 108  CO2 23  BUN 10  CREATININE 0.79  CALCIUM 9.2  PROT 6.2  BILITOT 0.4  ALKPHOS 62  ALT 31  AST 27  GLUCOSE 102*       Lab Results  Component Value Date   INR 1.07 11/27/2010     No results found for this or any previous visit (from the past 240 hour(s)).    Discharge Exam: Blood pressure 113/65, pulse 71, temperature 98.3 F (36.8 C), temperature source Oral, resp. rate 18, height 5\' 5"  (1.651 m), weight 78.336 kg (172 lb 11.2 oz), SpO2 98.00%.  Physical Exam: In general, she is a mildly overweight white woman who was in no apparent distress while sitting partially upright in bed. HEENT exam was within normal limits, neck was supple without jugular venous distention or carotid bruit, chest was clear to auscultation, heart had a regular rate and rhythm without significant murmur or gallop, abdomen was nondistended and had normal bowel sounds no tenderness or tympany. Extremities have bilateral trace ankle edema. Neurologic exam was nonfocal.  Disposition: She will be discharged to home with friends and family. She should schedule a followup visit with Dr. Henderson Newcomer in about 2 weeks following hospital discharge.  Discharge Orders    Future Orders Please Complete By Expires   Diet - low sodium heart healthy      Increase activity slowly      Discharge  instructions      Comments:   She should call one of our physicians if she develops recurrent nausea, vomiting, abdominal pain, diarrhea, fever, chills, or shortness of breath. She should schedule a followup visit with her primary care physician within one to 2 weeks following hospital discharge.   Call MD for:      Comments:   Temperature over 100.5, chills, shortness breath, nausea, vomiting, significant abdominal pain, diarrhea, or constipation.       Current Discharge Medication List    CONTINUE these medications which have NOT CHANGED   Details  CALCIUM PO Take 1 tablet by mouth daily.     carvedilol (COREG) 25 MG tablet Take 1 tablet (25 mg total) by mouth 2 (two) times daily. Qty: 60 tablet, Refills: 11   Associated Diagnoses: Other dyspnea and respiratory abnormality    Cetirizine HCl (ZYRTEC PO) Take 1 tablet by mouth daily.     Cholecalciferol (VITAMIN D3) 1000 UNITS CAPS Take 1 capsule by mouth.      citalopram (CELEXA) 40 MG tablet 1 tab po qd/ pt not taking regularly     ibuprofen (ADVIL,MOTRIN) 400 MG tablet Take 400 mg by mouth as needed.      losartan (COZAAR) 100 MG tablet Take 50 mg by mouth daily.     valACYclovir (VALTREX) 500 MG tablet Take 500 mg by mouth 2 (two) times daily.       STOP taking these medications     ciprofloxacin (CIPRO) 250 MG tablet        Follow-up Information    Follow up with Gaspar Garbe, MD in 2 weeks.         Signed: Garlan Fillers 06/09/2011, 4:16 PM

## 2011-06-21 DIAGNOSIS — D235 Other benign neoplasm of skin of trunk: Secondary | ICD-10-CM | POA: Diagnosis not present

## 2011-06-25 DIAGNOSIS — K56609 Unspecified intestinal obstruction, unspecified as to partial versus complete obstruction: Secondary | ICD-10-CM | POA: Diagnosis not present

## 2011-06-25 DIAGNOSIS — G4733 Obstructive sleep apnea (adult) (pediatric): Secondary | ICD-10-CM | POA: Diagnosis not present

## 2011-07-07 ENCOUNTER — Encounter (HOSPITAL_COMMUNITY): Payer: Self-pay | Admitting: *Deleted

## 2011-07-07 ENCOUNTER — Emergency Department (HOSPITAL_COMMUNITY)
Admission: EM | Admit: 2011-07-07 | Discharge: 2011-07-08 | Disposition: A | Discharge status: Home / Self Care | Payer: Medicare Other | Attending: Emergency Medicine | Admitting: Emergency Medicine

## 2011-07-07 DIAGNOSIS — Z79899 Other long term (current) drug therapy: Secondary | ICD-10-CM | POA: Insufficient documentation

## 2011-07-07 DIAGNOSIS — I2510 Atherosclerotic heart disease of native coronary artery without angina pectoris: Secondary | ICD-10-CM | POA: Insufficient documentation

## 2011-07-07 DIAGNOSIS — R112 Nausea with vomiting, unspecified: Secondary | ICD-10-CM | POA: Insufficient documentation

## 2011-07-07 DIAGNOSIS — I509 Heart failure, unspecified: Secondary | ICD-10-CM | POA: Insufficient documentation

## 2011-07-07 DIAGNOSIS — R109 Unspecified abdominal pain: Secondary | ICD-10-CM | POA: Insufficient documentation

## 2011-07-07 DIAGNOSIS — Z85038 Personal history of other malignant neoplasm of large intestine: Secondary | ICD-10-CM | POA: Insufficient documentation

## 2011-07-07 LAB — DIFFERENTIAL
Basophils Absolute: 0 10*3/uL (ref 0.0–0.1)
Basophils Relative: 0 % (ref 0–1)
Eosinophils Absolute: 0.1 10*3/uL (ref 0.0–0.7)
Eosinophils Relative: 1 % (ref 0–5)
Lymphocytes Relative: 9 % — ABNORMAL LOW (ref 12–46)
Lymphs Abs: 1 10*3/uL (ref 0.7–4.0)
Monocytes Absolute: 1.1 10*3/uL — ABNORMAL HIGH (ref 0.1–1.0)
Monocytes Relative: 10 % (ref 3–12)
Neutro Abs: 8.7 10*3/uL — ABNORMAL HIGH (ref 1.7–7.7)
Neutrophils Relative %: 79 % — ABNORMAL HIGH (ref 43–77)

## 2011-07-07 LAB — CBC
HCT: 40.6 % (ref 36.0–46.0)
Hemoglobin: 13.3 g/dL (ref 12.0–15.0)
MCH: 31.6 pg (ref 26.0–34.0)
MCHC: 32.8 g/dL (ref 30.0–36.0)
MCV: 96.4 fL (ref 78.0–100.0)
Platelets: 235 10*3/uL (ref 150–400)
RBC: 4.21 MIL/uL (ref 3.87–5.11)
RDW: 12.7 % (ref 11.5–15.5)
WBC: 11 10*3/uL — ABNORMAL HIGH (ref 4.0–10.5)

## 2011-07-07 LAB — POCT I-STAT, CHEM 8
BUN: 18 mg/dL (ref 6–23)
Calcium, Ion: 1.19 mmol/L (ref 1.12–1.32)
Chloride: 104 mEq/L (ref 96–112)
Creatinine, Ser: 1 mg/dL (ref 0.50–1.10)
Glucose, Bld: 146 mg/dL — ABNORMAL HIGH (ref 70–99)
HCT: 42 % (ref 36.0–46.0)
Hemoglobin: 14.3 g/dL (ref 12.0–15.0)
Potassium: 3.8 mEq/L (ref 3.5–5.1)
Sodium: 144 mEq/L (ref 135–145)
TCO2: 27 mmol/L (ref 0–100)

## 2011-07-07 MED ORDER — ONDANSETRON HCL 4 MG/2ML IJ SOLN
4.0000 mg | Freq: Once | INTRAMUSCULAR | Status: AC
  Administered 2011-07-07: 4 mg via INTRAVENOUS
  Filled 2011-07-07: qty 2

## 2011-07-07 MED ORDER — SODIUM CHLORIDE 0.9 % IV SOLN
INTRAVENOUS | Status: DC
  Administered 2011-07-07: via INTRAVENOUS

## 2011-07-07 NOTE — ED Notes (Signed)
Up to restroom to obtain urine specimen without event

## 2011-07-07 NOTE — ED Notes (Signed)
Patient with abdominal pain and nausea with vomiting today.  Patient states that her systolic was 89 at home.

## 2011-07-07 NOTE — ED Provider Notes (Signed)
History     CSN: [id number]  Arrival date & time 07/07/11  2107   First MD Initiated Contact with Patient 07/07/11 2300      Chief Complaint  Patient presents with  .  nausea and vomiting     (Consider location/radiation/quality/duration/timing/severity/associated sxs/prior treatment) HPI This is a 71 year old white female who became nauseated this afternoon. This occurred after sitting on the toilet for about an hour waiting to have a bowel movement. She had been constipated for several days and took MiraLAX. She subsequently began vomiting and had severe vomiting for several hours. The nausea and vomiting have ceased and she is drinking ginger ale now without emesis. There was no associated diarrhea. There was associated abdominal cramping. The abdominal cramping has also resolved.  Past Medical History  Diagnosis Date  . Conjunctivitis   . Febrile neutropenia   . Anemia   . Hypokalemia   . Mucositis   . LBBB (left bundle branch block)   . Nonischemic cardiomyopathy     EF 30 to 35%  . S/P cardiac cath 2000    Normal coronaries  . CHD (coronary heart disease)   . Renal artery aneurysm   . Cancer of colon 08/2005    STAGE 1 CHEMO AND RADIATION  . CHF (congestive heart failure)     Past Surgical History  Procedure Date  . Appendectomy   . Cesarean section   . Cholecystectomy   . Tonsillectomy   . Port-a-cath removal   . US echocardiography 3/12    EF 30 to 35%  . Hemorroidectomy   . Tubal ligation     Family History  Problem Relation Age of Onset  . Diabetes Sister   . Cancer Brother     PROSTATE    History  Substance Use Topics  . Smoking status: Never Smoker   . Smokeless tobacco: Never Used  . Alcohol Use: No    OB History    Grav Para Term Preterm Abortions TAB SAB Ect Mult Living   2 2 2       2       Review of Systems  All other systems reviewed and are negative.    Allergies  Codeine and Iohexol  Home Medications   Current  Outpatient Rx  Name Route Sig Dispense Refill  . CALCIUM PO Oral Take 1 tablet by mouth daily.     Marland Kitchen CARVEDILOL 25 MG PO TABS Oral Take 25 mg by mouth 2 (two) times daily with a meal.    . ZYRTEC PO Oral Take 1 tablet by mouth daily.     Marland Kitchen CITALOPRAM HYDROBROMIDE 40 MG PO TABS  1 tab po qd/ pt not taking regularly     . IBUPROFEN 400 MG PO TABS Oral Take 400 mg by mouth as needed. For pain    . LOSARTAN POTASSIUM 100 MG PO TABS Oral Take 50 mg by mouth daily.     Marland Kitchen FISH OIL PO Oral Take 1 capsule by mouth 2 (two) times daily.    Marland Kitchen VALACYCLOVIR HCL 500 MG PO TABS Oral Take 500 mg by mouth 2 (two) times daily.       BP 165/79  Temp 99 F (37.2 C)  Resp 20  SpO2 97%  Physical Exam General: Well-developed, well-nourished female in no acute distress; appearance consistent with age of record HENT: normocephalic, atraumatic; dry mucous membranes Eyes: pupils equal round and reactive to light; extraocular muscles intact Neck: supple Heart: regular rate and rhythm Lungs:  clear to auscultation bilaterally Abdomen: soft; nondistended; nontender; no masses or hepatosplenomegaly; bowel sounds present Extremities: No deformity; full range of motion; pulses normal; no edema Neurologic: Awake, alert and oriented; motor function intact in all extremities and symmetric; no facial droop Skin: Warm and dry Psychiatric: Normal mood and affect    ED Course  Procedures (including critical care time)     MDM   Nursing notes and vitals signs, including pulse oximetry, reviewed.  Summary of this visit's results, reviewed by myself:  Labs:  Results for orders placed during the hospital encounter of 07/07/11  URINALYSIS, ROUTINE W REFLEX MICROSCOPIC      Component Value Range   Color, Urine YELLOW  YELLOW    APPearance CLEAR  CLEAR    Specific Gravity, Urine 1.024  1.005 - 1.030    pH 8.5 (*) 5.0 - 8.0    Glucose, UA NEGATIVE  NEGATIVE (mg/dL)   Hgb urine dipstick SMALL (*) NEGATIVE     Bilirubin Urine NEGATIVE  NEGATIVE    Ketones, ur 15 (*) NEGATIVE (mg/dL)   Protein, ur 30 (*) NEGATIVE (mg/dL)   Urobilinogen, UA 0.2  0.0 - 1.0 (mg/dL)   Nitrite NEGATIVE  NEGATIVE    Leukocytes, UA TRACE (*) NEGATIVE   CBC      Component Value Range   WBC 11.0 (*) 4.0 - 10.5 (K/uL)   RBC 4.21  3.87 - 5.11 (MIL/uL)   Hemoglobin 13.3  12.0 - 15.0 (g/dL)   HCT 78.2  95.6 - 21.3 (%)   MCV 96.4  78.0 - 100.0 (fL)   MCH 31.6  26.0 - 34.0 (pg)   MCHC 32.8  30.0 - 36.0 (g/dL)   RDW 08.6  57.8 - 46.9 (%)   Platelets 235  150 - 400 (K/uL)  DIFFERENTIAL      Component Value Range   Neutrophils Relative 79 (*) 43 - 77 (%)   Neutro Abs 8.7 (*) 1.7 - 7.7 (K/uL)   Lymphocytes Relative 9 (*) 12 - 46 (%)   Lymphs Abs 1.0  0.7 - 4.0 (K/uL)   Monocytes Relative 10  3 - 12 (%)   Monocytes Absolute 1.1 (*) 0.1 - 1.0 (K/uL)   Eosinophils Relative 1  0 - 5 (%)   Eosinophils Absolute 0.1  0.0 - 0.7 (K/uL)   Basophils Relative 0  0 - 1 (%)   Basophils Absolute 0.0  0.0 - 0.1 (K/uL)  POCT I-STAT, CHEM 8      Component Value Range   Sodium 144  135 - 145 (mEq/L)   Potassium 3.8  3.5 - 5.1 (mEq/L)   Chloride 104  96 - 112 (mEq/L)   BUN 18  6 - 23 (mg/dL)   Creatinine, Ser 6.29  0.50 - 1.10 (mg/dL)   Glucose, Bld 528 (*) 70 - 99 (mg/dL)   Calcium, Ion 4.13  2.44 - 1.32 (mmol/L)   TCO2 27  0 - 100 (mmol/L)   Hemoglobin 14.3  12.0 - 15.0 (g/dL)   HCT 01.0  27.2 - 53.6 (%)  URINE MICROSCOPIC-ADD ON      Component Value Range   Squamous Epithelial / LPF RARE  RARE    WBC, UA 3-6  <3 (WBC/hpf)   RBC / HPF 7-10  <3 (RBC/hpf)   Bacteria, UA FEW (*) RARE    Urine-Other MUCOUS PRESENT      1:30 AM Continues to be able to drink fluids without emesis. States she is ready to go home.  Carlisle Beers Skylene Deremer, MD 07/08/11 0130

## 2011-07-07 NOTE — ED Notes (Signed)
Family member in waiting room to see patient; informed family member that patient is currently being triaged by triage RN and that she will be able to go back to see patient as soon as patient is assigned a room.

## 2011-07-08 LAB — URINE MICROSCOPIC-ADD ON

## 2011-07-08 LAB — URINALYSIS, ROUTINE W REFLEX MICROSCOPIC
Bilirubin Urine: NEGATIVE
Glucose, UA: NEGATIVE mg/dL
Ketones, ur: 15 mg/dL — AB
Nitrite: NEGATIVE
Protein, ur: 30 mg/dL — AB
Specific Gravity, Urine: 1.024 (ref 1.005–1.030)
Urobilinogen, UA: 0.2 mg/dL (ref 0.0–1.0)
pH: 8.5 — ABNORMAL HIGH (ref 5.0–8.0)

## 2011-07-08 MED ORDER — PROMETHAZINE HCL 25 MG RE SUPP: 25.0000 mg | Freq: Four times a day (QID) | RECTAL | Status: AC | PRN

## 2011-07-08 NOTE — ED Notes (Signed)
States nausea "calmed down";  Requesting ginger ale - ginger ale on ice given; resting quietly on stretcher with friend at bedside

## 2011-07-25 DIAGNOSIS — G4731 Primary central sleep apnea: Secondary | ICD-10-CM | POA: Diagnosis not present

## 2011-08-15 DIAGNOSIS — G4731 Primary central sleep apnea: Secondary | ICD-10-CM | POA: Diagnosis not present

## 2011-08-21 DIAGNOSIS — E559 Vitamin D deficiency, unspecified: Secondary | ICD-10-CM | POA: Diagnosis not present

## 2011-08-21 DIAGNOSIS — C210 Malignant neoplasm of anus, unspecified: Secondary | ICD-10-CM | POA: Diagnosis not present

## 2011-08-21 DIAGNOSIS — R82998 Other abnormal findings in urine: Secondary | ICD-10-CM | POA: Diagnosis not present

## 2011-08-21 DIAGNOSIS — I722 Aneurysm of renal artery: Secondary | ICD-10-CM | POA: Diagnosis not present

## 2011-08-27 ENCOUNTER — Other Ambulatory Visit: Payer: Self-pay | Admitting: Internal Medicine

## 2011-08-27 DIAGNOSIS — I722 Aneurysm of renal artery: Secondary | ICD-10-CM | POA: Diagnosis not present

## 2011-08-27 DIAGNOSIS — I059 Rheumatic mitral valve disease, unspecified: Secondary | ICD-10-CM | POA: Diagnosis not present

## 2011-08-27 DIAGNOSIS — C210 Malignant neoplasm of anus, unspecified: Secondary | ICD-10-CM | POA: Diagnosis not present

## 2011-08-27 DIAGNOSIS — Z0000 Encounter for general adult medical examination without abnormal findings: Secondary | ICD-10-CM | POA: Diagnosis not present

## 2011-08-28 DIAGNOSIS — Z1212 Encounter for screening for malignant neoplasm of rectum: Secondary | ICD-10-CM | POA: Diagnosis not present

## 2011-08-30 ENCOUNTER — Ambulatory Visit
Admission: RE | Admit: 2011-08-30 | Discharge: 2011-08-30 | Disposition: A | Discharge status: Home / Self Care | Payer: Medicare Other | Source: Ambulatory Visit | Attending: Internal Medicine | Admitting: Internal Medicine

## 2011-08-30 DIAGNOSIS — I722 Aneurysm of renal artery: Secondary | ICD-10-CM

## 2011-08-30 DIAGNOSIS — I7789 Other specified disorders of arteries and arterioles: Secondary | ICD-10-CM | POA: Diagnosis not present

## 2011-08-30 DIAGNOSIS — R3129 Other microscopic hematuria: Secondary | ICD-10-CM | POA: Diagnosis not present

## 2011-08-30 MED ORDER — IOHEXOL 350 MG/ML SOLN
125.0000 mL | Freq: Once | INTRAVENOUS | Status: AC | PRN
  Administered 2011-08-30: 125 mL via INTRAVENOUS

## 2011-09-17 ENCOUNTER — Other Ambulatory Visit: Payer: Self-pay | Admitting: Hematology and Oncology

## 2011-09-17 ENCOUNTER — Telehealth: Payer: Self-pay | Admitting: Hematology and Oncology

## 2011-09-17 DIAGNOSIS — C210 Malignant neoplasm of anus, unspecified: Secondary | ICD-10-CM

## 2011-09-17 NOTE — Telephone Encounter (Signed)
S/w pt today re appts for 4/15 lb/ct and 4/19 f/u. F/u moved to 4/19 per pt due to she works on Wednesdays.

## 2011-10-01 ENCOUNTER — Ambulatory Visit (HOSPITAL_COMMUNITY)
Admission: RE | Admit: 2011-10-01 | Discharge: 2011-10-01 | Disposition: A | Discharge status: Home / Self Care | Payer: Medicare Other | Source: Ambulatory Visit | Attending: Hematology and Oncology | Admitting: Hematology and Oncology

## 2011-10-01 ENCOUNTER — Other Ambulatory Visit (HOSPITAL_BASED_OUTPATIENT_CLINIC_OR_DEPARTMENT_OTHER): Payer: Medicare Other

## 2011-10-01 ENCOUNTER — Encounter (HOSPITAL_COMMUNITY): Payer: Self-pay

## 2011-10-01 DIAGNOSIS — C20 Malignant neoplasm of rectum: Secondary | ICD-10-CM

## 2011-10-01 DIAGNOSIS — Z9221 Personal history of antineoplastic chemotherapy: Secondary | ICD-10-CM | POA: Diagnosis not present

## 2011-10-01 DIAGNOSIS — Z9049 Acquired absence of other specified parts of digestive tract: Secondary | ICD-10-CM | POA: Diagnosis not present

## 2011-10-01 DIAGNOSIS — Z923 Personal history of irradiation: Secondary | ICD-10-CM | POA: Diagnosis not present

## 2011-10-01 DIAGNOSIS — Z9089 Acquired absence of other organs: Secondary | ICD-10-CM | POA: Diagnosis not present

## 2011-10-01 DIAGNOSIS — Z09 Encounter for follow-up examination after completed treatment for conditions other than malignant neoplasm: Secondary | ICD-10-CM | POA: Diagnosis not present

## 2011-10-01 DIAGNOSIS — C210 Malignant neoplasm of anus, unspecified: Secondary | ICD-10-CM | POA: Diagnosis not present

## 2011-10-01 LAB — CBC WITH DIFFERENTIAL/PLATELET
BASO%: 1 % (ref 0.0–2.0)
Basophils Absolute: 0 10*3/uL (ref 0.0–0.1)
EOS%: 3.5 % (ref 0.0–7.0)
Eosinophils Absolute: 0.2 10*3/uL (ref 0.0–0.5)
HCT: 34 % — ABNORMAL LOW (ref 34.8–46.6)
HGB: 11.5 g/dL — ABNORMAL LOW (ref 11.6–15.9)
LYMPH%: 35.2 % (ref 14.0–49.7)
MCH: 32.3 pg (ref 25.1–34.0)
MCHC: 33.8 g/dL (ref 31.5–36.0)
MCV: 95.6 fL (ref 79.5–101.0)
MONO#: 0.6 10*3/uL (ref 0.1–0.9)
MONO%: 13.2 % (ref 0.0–14.0)
NEUT#: 2.3 10*3/uL (ref 1.5–6.5)
NEUT%: 47.1 % (ref 38.4–76.8)
Platelets: 217 10*3/uL (ref 145–400)
RBC: 3.56 10*6/uL — ABNORMAL LOW (ref 3.70–5.45)
RDW: 12.7 % (ref 11.2–14.5)
WBC: 4.9 10*3/uL (ref 3.9–10.3)
lymph#: 1.7 10*3/uL (ref 0.9–3.3)
nRBC: 0 % (ref 0–0)

## 2011-10-01 LAB — CMP (CANCER CENTER ONLY)
ALT(SGPT): 23 U/L (ref 10–47)
AST: 22 U/L (ref 11–38)
Albumin: 3.4 g/dL (ref 3.3–5.5)
Alkaline Phosphatase: 69 U/L (ref 26–84)
BUN, Bld: 18 mg/dL (ref 7–22)
CO2: 30 mEq/L (ref 18–33)
Calcium: 8.8 mg/dL (ref 8.0–10.3)
Chloride: 100 mEq/L (ref 98–108)
Creat: 0.7 mg/dl (ref 0.6–1.2)
Glucose, Bld: 93 mg/dL (ref 73–118)
Potassium: 4.3 mEq/L (ref 3.3–4.7)
Sodium: 141 mEq/L (ref 128–145)
Total Bilirubin: 0.4 mg/dl (ref 0.20–1.60)
Total Protein: 7.2 g/dL (ref 6.4–8.1)

## 2011-10-01 MED ORDER — IOHEXOL 300 MG/ML  SOLN
100.0000 mL | Freq: Once | INTRAMUSCULAR | Status: AC | PRN
  Administered 2011-10-01: 100 mL via INTRAVENOUS

## 2011-10-03 ENCOUNTER — Ambulatory Visit: Payer: Medicare Other | Admitting: Hematology and Oncology

## 2011-10-05 ENCOUNTER — Ambulatory Visit: Payer: Medicare Other | Admitting: Hematology and Oncology

## 2011-10-05 ENCOUNTER — Encounter: Payer: Self-pay | Admitting: Hematology and Oncology

## 2011-10-05 ENCOUNTER — Ambulatory Visit (HOSPITAL_BASED_OUTPATIENT_CLINIC_OR_DEPARTMENT_OTHER): Payer: Medicare Other | Admitting: Hematology and Oncology

## 2011-10-05 VITALS — BP 104/58 | HR 78 | Temp 97.3°F | Ht 65.0 in | Wt 175.7 lb

## 2011-10-05 DIAGNOSIS — R1011 Right upper quadrant pain: Secondary | ICD-10-CM

## 2011-10-05 DIAGNOSIS — C211 Malignant neoplasm of anal canal: Secondary | ICD-10-CM

## 2011-10-05 DIAGNOSIS — C210 Malignant neoplasm of anus, unspecified: Secondary | ICD-10-CM

## 2011-10-05 NOTE — Patient Instructions (Signed)
Patient to follow up as instructed.   Current Outpatient Prescriptions  Medication Sig Dispense Refill  . CALCIUM PO Take 1 tablet by mouth daily.       . carvedilol (COREG) 25 MG tablet Take 50 mg by mouth daily.       . Cetirizine HCl (ZYRTEC PO) Take 1 tablet by mouth daily.       . citalopram (CELEXA) 40 MG tablet Take 40 mg by mouth as needed. 1 tab po qd/ pt not taking regularly       . ibuprofen (ADVIL,MOTRIN) 400 MG tablet Take 400 mg by mouth as needed. For pain      . valACYclovir (VALTREX) 500 MG tablet Take 500 mg by mouth 2 (two) times daily as needed.       . vitamin B-12 (CYANOCOBALAMIN) 1000 MCG tablet Take 1,000 mcg by mouth daily.      Marland Kitchen losartan (COZAAR) 100 MG tablet Take 50 mg by mouth daily.       . Omega-3 Fatty Acids (FISH OIL PO) Take 1 capsule by mouth 2 (two) times daily.            April 2013  Sunday Monday Tuesday Wednesday Thursday Friday Saturday      1   2   3   4   5   6    7   8   9   10   11   12   13    14   15    LAB MO     1:00 PM  (15 min.)  Alvina Filbert  [facility]   CT BODY W/   2:00 PM  (30 min.)  Wl-Ct 2  [facility]-CT IMAGING 16   17   18   19    EST PT 15  11:15 AM  (15 min.)  Laurice Record, MD  [facility] MEDICAL ONCOLOGY 20    21   22   23   24   25   26   27    28   29    30

## 2011-10-05 NOTE — Progress Notes (Signed)
CC:   Gaspar Garbe, M.D. Anselmo Rod, MD, Clementeen Graham Timothy P. Fontaine, M.D. Adolph Pollack, M.D.  IDENTIFYING STATEMENT:  The patient is a 71 year old woman with anal cancer who presents for followup.  INTERVAL HISTORY:  Mrs. Maclaren was seen 6 months ago.  She notes the occasional right upper quadrant discomfort.  She last received a colonoscopy in August 2012 that was recorded as being unremarkable.  She has not lost any weight.  She denies rectal bleeding.  A recent CT scan of the abdomen and pelvis on 10/01/2011 noted no evidence of intra- abdominal or pelvic metastatic disease.  The uterus contained fibroids and this remained unchanged.  There is a prominence in the common bile duct measuring 1.6 cm, which is stable.  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  There was no ascites or adenopathy.  MEDICATIONS:  Medications reviewed and updated.  PAST MEDICAL HISTORY/FAMILY HISTORY/SOCIAL HISTORY:  Unchanged.  PHYSICAL EXAMINATION:  General:  The patient is a well-appearing, well- nourished woman in no distress.  Vitals:  Pulse 78, blood pressure 104/58, temperature 97.3, respirations 20, weight 175 pounds.  HEENT: Head is atraumatic, normocephalic.  Sclerae anicteric.  Mouth moist. Neck:  Supple.  Chest:  Clear.  CVS:  Unremarkable.  Abdomen:  Soft, nontender.  Bowel sounds present.  Extremities:  No calf tenderness or edema.  Lymph Nodes:  No adenopathy.  CNS:  Nonfocal.  IMPRESSION AND PLAN:  Mrs. 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  She is status post concurrent radiation with mitomycin C and 5-FU on 11/22/2005.  Her most up-to-date CT scans and current exam indicate no evidence of recurrence.  She does have some intermittent right upper quadrant discomfort.  CT scan notes a choledochal cyst.  I am not quite sure of the significance of this.  Therefore, I will refer her to  Dr. Abbey Chatters for further evaluation.  She follows up in a year's time or Sooner if needed.    ______________________________ Laurice Record, M.D. LIO/MEDQ  D:  10/05/2011  T:  10/05/2011  Job:  [id number]

## 2011-10-05 NOTE — Progress Notes (Signed)
This office note has been dictated.

## 2011-10-08 ENCOUNTER — Other Ambulatory Visit: Payer: Self-pay | Admitting: Gynecology

## 2011-10-08 DIAGNOSIS — M79609 Pain in unspecified limb: Secondary | ICD-10-CM | POA: Diagnosis not present

## 2011-10-09 ENCOUNTER — Telehealth: Payer: Self-pay | Admitting: Hematology and Oncology

## 2011-10-09 ENCOUNTER — Telehealth: Payer: Self-pay | Admitting: Cardiovascular Disease

## 2011-10-09 NOTE — Telephone Encounter (Signed)
New msg Pt said she was prescribed celebrex 200 mg and she wanted to know if she could take this med.

## 2011-10-09 NOTE — Telephone Encounter (Signed)
S/w pt re appts for April 2014. Lb/ct 4/11 and LO 4/15.

## 2011-10-09 NOTE — Telephone Encounter (Signed)
Please advise pt re: celebrex use with HF med's.

## 2011-10-10 NOTE — Telephone Encounter (Signed)
Pt called/ msg left to only take celebrex occasionally/1-2 x week. Told pt to call prescriber and see if there is an alternative med that would work better with her HF and meds. Asked her to call back with further questions or concerns.

## 2011-10-10 NOTE — Telephone Encounter (Signed)
She may take celebrex occasionally.

## 2011-10-11 ENCOUNTER — Telehealth (INDEPENDENT_AMBULATORY_CARE_PROVIDER_SITE_OTHER): Payer: Self-pay

## 2011-10-11 NOTE — Telephone Encounter (Signed)
Pt called with questions about her CT scan of 4/19.  Dr. Dalene Carrow ordered the scan.  She told the pt that there looked like a "cyst" in her abdomen where her gallbladder used to be.  The patient is concerned.  I told her I would talk to Dr. Abbey Chatters, show him the CT, and we would call her back tomorrow.

## 2011-10-12 ENCOUNTER — Telehealth (INDEPENDENT_AMBULATORY_CARE_PROVIDER_SITE_OTHER): Payer: Self-pay | Admitting: General Surgery

## 2011-10-12 NOTE — Telephone Encounter (Signed)
She called with questions about her CT scan done earlier this month to follow up her anal cancer.  Dr. Dalene Carrow ordered the CT. There is mention of a possible cyst around the bile duct.  She was concerned about that so called here for our advice. I reviewed her recent CT scan as well as CT scans going back to 2008. I had another radiologist review these as well. There is no mass in the head of pancreas.  There is some dilatation seen at the distal common bile duct which has been stable all these years. She is post cholecystectomy and the radiologist mentioned that this is not uncommon to see something like this in the  postcholecystectomy. Her liver functions are within normal limits. She does not have biliary colic-type abdominal pain. The radiologist did not feel like she needed any further study of this and I agree. I explained this to her and she seemed to feel better about it.

## 2011-10-15 DIAGNOSIS — M79609 Pain in unspecified limb: Secondary | ICD-10-CM | POA: Diagnosis not present

## 2011-10-15 DIAGNOSIS — G4737 Central sleep apnea in conditions classified elsewhere: Secondary | ICD-10-CM | POA: Diagnosis not present

## 2011-10-15 DIAGNOSIS — I509 Heart failure, unspecified: Secondary | ICD-10-CM | POA: Diagnosis not present

## 2011-10-19 DIAGNOSIS — M79609 Pain in unspecified limb: Secondary | ICD-10-CM | POA: Diagnosis not present

## 2011-10-23 DIAGNOSIS — M79609 Pain in unspecified limb: Secondary | ICD-10-CM | POA: Diagnosis not present

## 2011-10-26 DIAGNOSIS — M79609 Pain in unspecified limb: Secondary | ICD-10-CM | POA: Diagnosis not present

## 2011-10-30 DIAGNOSIS — M79609 Pain in unspecified limb: Secondary | ICD-10-CM | POA: Diagnosis not present

## 2011-11-02 DIAGNOSIS — M79609 Pain in unspecified limb: Secondary | ICD-10-CM | POA: Diagnosis not present

## 2011-11-06 DIAGNOSIS — M722 Plantar fascial fibromatosis: Secondary | ICD-10-CM | POA: Diagnosis not present

## 2011-11-08 DIAGNOSIS — M25579 Pain in unspecified ankle and joints of unspecified foot: Secondary | ICD-10-CM | POA: Diagnosis not present

## 2011-11-13 DIAGNOSIS — Z1231 Encounter for screening mammogram for malignant neoplasm of breast: Secondary | ICD-10-CM | POA: Diagnosis not present

## 2011-11-20 ENCOUNTER — Encounter: Payer: Self-pay | Admitting: Gynecology

## 2011-11-20 DIAGNOSIS — M25569 Pain in unspecified knee: Secondary | ICD-10-CM | POA: Diagnosis not present

## 2011-11-20 DIAGNOSIS — M722 Plantar fascial fibromatosis: Secondary | ICD-10-CM | POA: Diagnosis not present

## 2011-11-22 DIAGNOSIS — G252 Other specified forms of tremor: Secondary | ICD-10-CM | POA: Diagnosis not present

## 2011-11-22 DIAGNOSIS — G250 Essential tremor: Secondary | ICD-10-CM | POA: Diagnosis not present

## 2011-11-22 DIAGNOSIS — G4737 Central sleep apnea in conditions classified elsewhere: Secondary | ICD-10-CM | POA: Diagnosis not present

## 2011-11-22 DIAGNOSIS — I509 Heart failure, unspecified: Secondary | ICD-10-CM | POA: Diagnosis not present

## 2011-11-22 DIAGNOSIS — G2581 Restless legs syndrome: Secondary | ICD-10-CM | POA: Diagnosis not present

## 2011-11-26 DIAGNOSIS — M722 Plantar fascial fibromatosis: Secondary | ICD-10-CM | POA: Diagnosis not present

## 2011-11-27 DIAGNOSIS — M722 Plantar fascial fibromatosis: Secondary | ICD-10-CM | POA: Diagnosis not present

## 2011-11-27 DIAGNOSIS — H2510 Age-related nuclear cataract, unspecified eye: Secondary | ICD-10-CM | POA: Diagnosis not present

## 2011-11-27 DIAGNOSIS — H5200 Hypermetropia, unspecified eye: Secondary | ICD-10-CM | POA: Diagnosis not present

## 2011-11-27 DIAGNOSIS — H18519 Endothelial corneal dystrophy, unspecified eye: Secondary | ICD-10-CM | POA: Diagnosis not present

## 2011-11-30 DIAGNOSIS — M722 Plantar fascial fibromatosis: Secondary | ICD-10-CM | POA: Diagnosis not present

## 2011-12-03 DIAGNOSIS — M722 Plantar fascial fibromatosis: Secondary | ICD-10-CM | POA: Diagnosis not present

## 2011-12-04 DIAGNOSIS — M722 Plantar fascial fibromatosis: Secondary | ICD-10-CM | POA: Diagnosis not present

## 2011-12-11 ENCOUNTER — Emergency Department (INDEPENDENT_AMBULATORY_CARE_PROVIDER_SITE_OTHER): Payer: Medicare Other

## 2011-12-11 ENCOUNTER — Emergency Department (INDEPENDENT_AMBULATORY_CARE_PROVIDER_SITE_OTHER)
Admission: EM | Admit: 2011-12-11 | Discharge: 2011-12-11 | Disposition: A | Discharge status: Home / Self Care | Payer: Medicare Other | Source: Home / Self Care | Attending: Emergency Medicine | Admitting: Emergency Medicine

## 2011-12-11 ENCOUNTER — Encounter (HOSPITAL_COMMUNITY): Payer: Self-pay | Admitting: Emergency Medicine

## 2011-12-11 DIAGNOSIS — S93609A Unspecified sprain of unspecified foot, initial encounter: Secondary | ICD-10-CM | POA: Diagnosis not present

## 2011-12-11 DIAGNOSIS — M79609 Pain in unspecified limb: Secondary | ICD-10-CM | POA: Diagnosis not present

## 2011-12-11 NOTE — ED Notes (Signed)
Has been seeing dr Lestine Box for heel pain, had been in a boot.  While wearing the boot missed a step, front of foot (ball and toes) stepped lower than heel even with boot intact per patient.  Foot swollen. Pain with pressure

## 2011-12-11 NOTE — Discharge Instructions (Signed)
  Followup with the orthopedic Dr. If discomfort persists beyond 7 days   Foot Sprain You have a sprained foot. When you twist your foot, the ligaments that hold the joints together are injured. This may cause pain, swelling, bruising, and difficulty walking. Proper treatment will shorten your disability and help you prevent re-injury. To treat a sprained foot you should:  Elevate your foot for the next 2-3 days to reduce swelling.   Apply ice packs to the foot for 20-30 minutes every 2-3 hours.   Wrap your foot with a compression bandage as long as it is swollen or tender.   Do not walk on your foot if it still hurts a lot.  Use crutches or a cane until weight bearing becomes painless.   Special podiatric shoes or shoes with rigid soles may be useful in allowing earlier walking.  Only take over-the-counter or prescription medicines for pain, discomfort, or fever as directed by your caregiver. Most foot sprains will heal completely in 3-6 weeks with proper rest.  If you still have pain or swelling after 2-3 weeks, or if your pain worsens, you should see your doctor for further evaluation. Document Released: 07/12/2004 Document Revised: 05/24/2011 Document Reviewed: 06/05/2008 [facility] Patient Information 2012 [place], [place].Foot Sprain You have a sprained foot. When you twist your foot, the ligaments that hold the joints together are injured. This may cause pain, swelling, bruising, and difficulty walking. Proper treatment will shorten your disability and help you prevent re-injury. To treat a sprained foot you should:  Elevate your foot for the next 2-3 days to reduce swelling.   Apply ice packs to the foot for 20-30 minutes every 2-3 hours.   Wrap your foot with a compression bandage as long as it is swollen or tender.   Do not walk on your foot if it still hurts a lot.  Use crutches or a cane until weight bearing becomes painless.   Special podiatric shoes or shoes with rigid soles  may be useful in allowing earlier walking.  Only take over-the-counter or prescription medicines for pain, discomfort, or fever as directed by your caregiver. Most foot sprains will heal completely in 3-6 weeks with proper rest.  If you still have pain or swelling after 2-3 weeks, or if your pain worsens, you should see your doctor for further evaluation. Document Released: 07/12/2004 Document Revised: 05/24/2011 Document Reviewed: 06/05/2008 [facility] Patient Information 2012 [place], [place].

## 2011-12-11 NOTE — ED Provider Notes (Signed)
History     CSN: [id number]  Arrival date & time 12/11/11  1152   First MD Initiated Contact with Patient 12/11/11 1223      Chief Complaint  Patient presents with  . Foot Pain    (Consider location/radiation/quality/duration/timing/severity/associated sxs/prior treatment) HPI Comments: Burgess Estelle was she was wearing her boot she missed a step in front of foot stepped lower then her heel, she woke up with pain on the dorsum aspect of her left foot. Hurts more when she walks on it. Patient denies any numbness, tingling sensation or weakness to the affected foot  Patient is a 71 y.o. female presenting with lower extremity pain. The history is provided by the patient.  Foot Pain This is a new problem. The current episode started yesterday. The problem has not changed since onset.Pertinent negatives include no chest pain and no headaches. The symptoms are aggravated by bending and standing. The symptoms are relieved by walking. She has tried nothing for the symptoms. The treatment provided no relief.    Past Medical History  Diagnosis Date  . Conjunctivitis   . Febrile neutropenia   . Anemia   . Hypokalemia   . Mucositis   . LBBB (left bundle branch block)   . Nonischemic cardiomyopathy     EF 30 to 35%  . S/P cardiac cath 2000    Normal coronaries  . CHD (coronary heart disease)   . Renal artery aneurysm   . CHF (congestive heart failure)   . Cancer of colon 08/2005    STAGE 1 CHEMO AND RADIATION    Past Surgical History  Procedure Date  . Appendectomy   . Cesarean section   . Cholecystectomy   . Tonsillectomy   . Port-a-cath removal   . US echocardiography 3/12    EF 30 to 35%  . Hemorroidectomy   . Tubal ligation     Family History  Problem Relation Age of Onset  . Diabetes Sister   . Cancer Brother     PROSTATE    History  Substance Use Topics  . Smoking status: Never Smoker   . Smokeless tobacco: Never Used  . Alcohol Use: No    OB History    Grav  Para Term Preterm Abortions TAB SAB Ect Mult Living   2 2 2       2       Review of Systems  Constitutional: Negative for fever, chills, diaphoresis, activity change, appetite change and fatigue.  Cardiovascular: Negative for chest pain.  Musculoskeletal: Negative for back pain and arthralgias.  Skin: Negative for rash and wound.  Neurological: Negative for headaches.    Allergies  Codeine and Iohexol  Home Medications   Current Outpatient Rx  Name Route Sig Dispense Refill  . REQUIP PO Oral Take by mouth.    . SONATA PO Oral Take by mouth.    Marland Kitchen CALCIUM PO Oral Take 1 tablet by mouth daily.     Marland Kitchen CARVEDILOL 25 MG PO TABS Oral Take 50 mg by mouth daily.     Marland Kitchen ZYRTEC PO Oral Take 1 tablet by mouth daily.     Marland Kitchen CITALOPRAM HYDROBROMIDE 40 MG PO TABS Oral Take 40 mg by mouth as needed. 1 tab po qd/ pt not taking regularly     . IBUPROFEN 400 MG PO TABS Oral Take 400 mg by mouth as needed. For pain    . LOSARTAN POTASSIUM 100 MG PO TABS Oral Take 50 mg by mouth daily.     Marland Kitchen  FISH OIL PO Oral Take 1 capsule by mouth 2 (two) times daily.    Marland Kitchen VALACYCLOVIR HCL 500 MG PO TABS  TAKE 1 TABLET BY MOUTH TWICE A DAY FOR 5 DAYS 10 tablet 5  . VITAMIN B-12 1000 MCG PO TABS Oral Take 1,000 mcg by mouth daily.      BP 113/61  Pulse 66  Temp 98.4 F (36.9 C) (Oral)  Resp 20  SpO2 96%  Physical Exam  Nursing note and vitals reviewed. Constitutional: She appears well-developed and well-nourished.  Non-toxic appearance. She does not have a sickly appearance. She does not appear ill. No distress.  Musculoskeletal: She exhibits tenderness.       Left foot: She exhibits tenderness, bony tenderness and swelling. She exhibits normal range of motion, normal capillary refill, no crepitus, no deformity and no laceration.       Feet:  Neurological: She is alert.  Skin: No rash noted. No erythema.    ED Course  Procedures (including critical care time)  Labs Reviewed - No data to display No  results found.   No diagnosis found.    MDM  Foot sprain. Also with orthopedic Dr. encourage her to call followup in 2 wear her boot-        Jimmie Molly, MD 12/11/11 1349

## 2011-12-11 NOTE — ED Notes (Signed)
Dr call at bedside reviewing results with patient

## 2011-12-14 DIAGNOSIS — M79609 Pain in unspecified limb: Secondary | ICD-10-CM | POA: Diagnosis not present

## 2011-12-15 DIAGNOSIS — M79609 Pain in unspecified limb: Secondary | ICD-10-CM | POA: Diagnosis not present

## 2011-12-24 ENCOUNTER — Encounter: Payer: Self-pay | Admitting: Gynecology

## 2011-12-24 ENCOUNTER — Ambulatory Visit (INDEPENDENT_AMBULATORY_CARE_PROVIDER_SITE_OTHER): Payer: Medicare Other | Admitting: Gynecology

## 2011-12-24 ENCOUNTER — Other Ambulatory Visit: Payer: Self-pay | Admitting: Gynecology

## 2011-12-24 ENCOUNTER — Encounter: Payer: Self-pay | Admitting: Cardiovascular Disease

## 2011-12-24 ENCOUNTER — Ambulatory Visit (INDEPENDENT_AMBULATORY_CARE_PROVIDER_SITE_OTHER): Payer: Medicare Other | Admitting: Cardiovascular Disease

## 2011-12-24 VITALS — BP 108/72 | HR 72 | Ht 65.0 in | Wt 178.2 lb

## 2011-12-24 VITALS — BP 112/70 | Ht 65.0 in | Wt 175.0 lb

## 2011-12-24 DIAGNOSIS — I447 Left bundle-branch block, unspecified: Secondary | ICD-10-CM

## 2011-12-24 DIAGNOSIS — D259 Leiomyoma of uterus, unspecified: Secondary | ICD-10-CM

## 2011-12-24 DIAGNOSIS — I509 Heart failure, unspecified: Secondary | ICD-10-CM

## 2011-12-24 DIAGNOSIS — K625 Hemorrhage of anus and rectum: Secondary | ICD-10-CM | POA: Diagnosis not present

## 2011-12-24 DIAGNOSIS — R350 Frequency of micturition: Secondary | ICD-10-CM | POA: Diagnosis not present

## 2011-12-24 DIAGNOSIS — N952 Postmenopausal atrophic vaginitis: Secondary | ICD-10-CM | POA: Diagnosis not present

## 2011-12-24 DIAGNOSIS — R002 Palpitations: Secondary | ICD-10-CM | POA: Diagnosis not present

## 2011-12-24 DIAGNOSIS — I5022 Chronic systolic (congestive) heart failure: Secondary | ICD-10-CM | POA: Diagnosis not present

## 2011-12-24 DIAGNOSIS — IMO0001 Reserved for inherently not codable concepts without codable children: Secondary | ICD-10-CM

## 2011-12-24 NOTE — Patient Instructions (Signed)
Your physician wants you to follow-up in:  6 months.  You will receive a reminder letter in the mail two months in advance. If you don't receive a letter, please call our office to schedule the follow-up appointment.  Your physician has recommended that you wear an event monitor. Event monitors are medical devices that record the heart's electrical activity. Doctors most often us these monitors to diagnose arrhythmias. Arrhythmias are problems with the speed or rhythm of the heartbeat. The monitor is a small, portable device. You can wear one while you do your normal daily activities. This is usually used to diagnose what is causing palpitations/syncope (passing out).   

## 2011-12-24 NOTE — Assessment & Plan Note (Signed)
Samantha [name]'s CHF has improved.  Her LV EF is now 45-50%.  She is asymptomatic.  She occasionally has low BP.  Overall, I think she is doing well.

## 2011-12-24 NOTE — Progress Notes (Signed)
RAIVYN KABLER 12/06/1940 [id number]        71 y.o. G2 P2 for annual follow up.  Several issues noted below.  Past medical history,surgical history, medications, allergies, family history and social history were all reviewed and documented in the EPIC chart. ROS:  Was performed and pertinent positives and negatives are included in the history.  Exam: Sherrilyn Rist assistant Filed Vitals:   12/24/11 1521  BP: 112/70   General appearance  Normal Skin grossly normal Head/Neck normal with no cervical or supraclavicular adenopathy thyroid normal Lungs  clear Cardiac RR, without RMG Abdominal  soft, nontender, without masses, organomegaly or hernia Breasts  examined lying and sitting without masses, retractions, discharge or axillary adenopathy. Pelvic  Ext/BUS/vagina  normal with atrophic changes.  Cervix  normal with atrophic changes.  Uterus  Difficult to palpate, but grossly normal size, shape and contour, midline and mobile nontender   Adnexa  Without masses or tenderness    Anus and perineum  normal   Rectovaginal  normal sphincter tone without palpated masses or tenderness. No evidence of fissures or hemorrhoids.   Assessment/Plan:  71 y.o. G2 P2 female for annual follow up.    1. Leiomyoma. Patient recently had CT scan where they noted leiomyoma stable from prior CT in December. Patient has no history of this previously although had no reason for scans previously. Her exam is somewhat limited by abdominal girth but there is no gross abnormalities nor have there ever been on bimanual. I discussed with patient and reassured her that I think this is an incidental finding and that she probably has had the leiomyoma for a long time. As she is postmenopausal and not having any other issues that we'll plan on just monitoring them and she agrees. 2. Atrophic genital changes. Patient is asymptomatic. She's not section active and will continue to monitor. 3. Rectal bleeding. Patient notes occasional  bright red blood on toilet paper with wiping after bowel movement. She just had a colonoscopy within the past year which was negative. Her exam today is not showing gross evidence of hemorrhoids/fissures. We'll plan observation at this time. It would continue over any length of time then I would refer to gastroenterology. 4. Mammography. Patient just had her mammogram in May. She'll continue with annual mammography. SBE monthly reviewed. 5. Bone density. She had a bone density a year ago through Dr. Dorothea Ogle suffix office and she'll continue to follow up with him in reference to this. 6. Pap smear. Patient has no history of abnormal Pap smears with last Pap smear 2012. I reviewed current screening guidelines as she is over the age of 80 we'll plan on stopping screening now and her Pap smear was done today. 7. Health maintenance. No blood work was done today as it's all done through her primary physician's office. She was noticing some mild urinary frequency we'll check a urinalysis today.    Dara Lords MD, 4:10 PM 12/24/2011

## 2011-12-24 NOTE — Assessment & Plan Note (Signed)
Stable

## 2011-12-24 NOTE — Progress Notes (Signed)
Precious Gilding Date of Birth  06/08/1941       [facility]    [place] [address]  [address] [place], [place]  [address]   [place], [place]  [address] [phone]     [phone]   Fax  [phone]    Fax [phone]  Problem List: 1. Congestive heart failure- original EF 30-35%, now EF = 45-50% 2. History colon cancer-status post radiation and chemotherapy-April 2007 3. Smooth and normal coronary arteries by heart catheterization in 2000  History of Present Illness:  She has done well from a cardiac stand point.  She has had some dizziness and found her BP to be low yesterday.  She has occasional palpitations and frequently finds that her heart rate is about 100 with minimal exertion.  Current Outpatient Prescriptions on File Prior to Visit  Medication Sig Dispense Refill  . carvedilol (COREG) 25 MG tablet Take 50 mg by mouth daily.       . Cetirizine HCl (ZYRTEC PO) Take 1 tablet by mouth daily.       . citalopram (CELEXA) 40 MG tablet Take 40 mg by mouth as needed. 1 tab po qd/ pt not taking regularly       . ibuprofen (ADVIL,MOTRIN) 400 MG tablet Take 400 mg by mouth as needed. For pain      . losartan (COZAAR) 100 MG tablet Take 50 mg by mouth daily.       Marland Kitchen ROPINIRole HCl (REQUIP PO) Take by mouth.      . valACYclovir (VALTREX) 500 MG tablet TAKE 1 TABLET BY MOUTH TWICE A DAY FOR 5 DAYS  10 tablet  5  . vitamin B-12 (CYANOCOBALAMIN) 1000 MCG tablet Take 1,000 mcg by mouth daily.      . Zaleplon (SONATA PO) Take 10 mg by mouth daily.         Allergies  Allergen Reactions  . Codeine     Upset stomach    . Iohexol      Desc: Pt. states she broke out in hives 30 yrs ago w/ a CT Brain scan.  She has since had a heart cath and this CT today (08/10/05) w/o premeds and did well. No evident reaction.  Thanks.     Past Medical History  Diagnosis Date  . Conjunctivitis   . Febrile neutropenia   . Anemia   . Hypokalemia    . Mucositis   . LBBB (left bundle branch block)   . Nonischemic cardiomyopathy     EF 30 to 35%  . S/P cardiac cath 2000    Normal coronaries  . CHD (coronary heart disease)   . Renal artery aneurysm   . CHF (congestive heart failure)   . Cancer of colon 08/2005    STAGE 1 CHEMO AND RADIATION    Past Surgical History  Procedure Date  . Appendectomy   . Cesarean section   . Cholecystectomy   . Tonsillectomy   . Port-a-cath removal   . US echocardiography 3/12    EF 30 to 35%  . Hemorroidectomy   . Tubal ligation     History  Smoking status  . Never Smoker   Smokeless tobacco  . Never Used    History  Alcohol Use No    Family History  Problem Relation Age of Onset  . Diabetes Sister   . Cancer Brother     PROSTATE    Reviw of Systems:  Reviewed in the HPI.  All other systems are negative.  Physical Exam: Blood pressure 108/72, pulse 72, height 5\' 5"  (1.651 m), weight 178 lb 3.2 oz (80.831 kg). General: Well developed, well nourished, in no acute distress.  Head: Normocephalic, atraumatic, sclera non-icteric, mucus membranes are moist,   Neck: Supple. Carotids are 2 + without bruits. No JVD  Lungs: Clear bilaterally to auscultation.  Heart: regular rate.  normal  S1 S2. No murmurs, gallops or rubs.  Abdomen: Soft, non-tender, non-distended with normal bowel sounds. No hepatomegaly. No rebound/guarding. No masses.  Msk:  Strength and tone are normal  Extremities: No clubbing or cyanosis. No edema.  Distal pedal pulses are 2+ and equal bilaterally.  Neuro: Alert and oriented X 3. Moves all extremities spontaneously.  Psych:  Responds to questions appropriately with a normal affect.  ECG:  Assessment / Plan:

## 2011-12-24 NOTE — Patient Instructions (Signed)
Follow up in one year for follow up exam.

## 2011-12-25 DIAGNOSIS — M79609 Pain in unspecified limb: Secondary | ICD-10-CM | POA: Diagnosis not present

## 2011-12-25 LAB — URINALYSIS W MICROSCOPIC + REFLEX CULTURE
Bilirubin Urine: NEGATIVE
Casts: NONE SEEN
Crystals: NONE SEEN
Glucose, UA: NEGATIVE mg/dL
Ketones, ur: NEGATIVE mg/dL
Leukocytes, UA: NEGATIVE
Nitrite: NEGATIVE
Protein, ur: NEGATIVE mg/dL
Specific Gravity, Urine: 1.025 (ref 1.005–1.030)
Urobilinogen, UA: 0.2 mg/dL (ref 0.0–1.0)
pH: 5.5 (ref 5.0–8.0)

## 2011-12-26 ENCOUNTER — Other Ambulatory Visit: Payer: Self-pay | Admitting: Gynecology

## 2011-12-26 DIAGNOSIS — R319 Hematuria, unspecified: Secondary | ICD-10-CM

## 2011-12-26 LAB — URINE CULTURE
Colony Count: NO GROWTH
Organism ID, Bacteria: NO GROWTH

## 2011-12-27 ENCOUNTER — Other Ambulatory Visit: Payer: Medicare Other

## 2011-12-27 DIAGNOSIS — R319 Hematuria, unspecified: Secondary | ICD-10-CM

## 2011-12-28 LAB — URINALYSIS W MICROSCOPIC + REFLEX CULTURE
Bacteria, UA: NONE SEEN
Bilirubin Urine: NEGATIVE
Casts: NONE SEEN
Crystals: NONE SEEN
Glucose, UA: NEGATIVE mg/dL
Ketones, ur: NEGATIVE mg/dL
Leukocytes, UA: NEGATIVE
Nitrite: NEGATIVE
Protein, ur: NEGATIVE mg/dL
Specific Gravity, Urine: <1.005 — ABNORMAL LOW (ref 1.005–1.030)
Squamous Epithelial / LPF: NONE SEEN
Urobilinogen, UA: 0.2 mg/dL (ref 0.0–1.0)
pH: 7 (ref 5.0–8.0)

## 2012-01-02 ENCOUNTER — Telehealth: Payer: Self-pay | Admitting: *Deleted

## 2012-01-02 NOTE — Telephone Encounter (Signed)
Pt calling requesting urine culture results, please advise

## 2012-01-02 NOTE — Telephone Encounter (Signed)
Pt informed with negative results.

## 2012-01-02 NOTE — Telephone Encounter (Signed)
As reported under results review No growth

## 2012-01-22 DIAGNOSIS — M79609 Pain in unspecified limb: Secondary | ICD-10-CM | POA: Diagnosis not present

## 2012-01-22 DIAGNOSIS — R002 Palpitations: Secondary | ICD-10-CM | POA: Diagnosis not present

## 2012-02-07 ENCOUNTER — Telehealth: Payer: Self-pay | Admitting: *Deleted

## 2012-02-07 NOTE — Telephone Encounter (Signed)
ecardio results given, sr/pvc's

## 2012-02-19 DIAGNOSIS — M79609 Pain in unspecified limb: Secondary | ICD-10-CM | POA: Diagnosis not present

## 2012-02-21 ENCOUNTER — Other Ambulatory Visit: Payer: Self-pay

## 2012-02-21 MED ORDER — LOSARTAN POTASSIUM 50 MG PO TABS: 50.0000 mg | ORAL_TABLET | Freq: Every day | ORAL | Status: DC

## 2012-02-28 ENCOUNTER — Other Ambulatory Visit: Payer: Self-pay | Admitting: *Deleted

## 2012-02-28 DIAGNOSIS — I722 Aneurysm of renal artery: Secondary | ICD-10-CM | POA: Diagnosis not present

## 2012-02-28 DIAGNOSIS — E559 Vitamin D deficiency, unspecified: Secondary | ICD-10-CM | POA: Diagnosis not present

## 2012-02-28 DIAGNOSIS — Z23 Encounter for immunization: Secondary | ICD-10-CM | POA: Diagnosis not present

## 2012-02-28 DIAGNOSIS — I059 Rheumatic mitral valve disease, unspecified: Secondary | ICD-10-CM | POA: Diagnosis not present

## 2012-02-28 MED ORDER — CARVEDILOL 25 MG PO TABS: 50.0000 mg | ORAL_TABLET | Freq: Every day | ORAL | Status: DC

## 2012-02-28 NOTE — Telephone Encounter (Signed)
Fax Received. Refill Completed. Landon Bassford Chowoe (R.M.A)   

## 2012-03-12 ENCOUNTER — Telehealth: Payer: Self-pay | Admitting: Cardiovascular Disease

## 2012-03-12 NOTE — Telephone Encounter (Signed)
New problem:  C/o blood pressure is low on yesterday. 120/61 today. At work now .

## 2012-03-12 NOTE — Telephone Encounter (Signed)
lmtcb on home number 

## 2012-03-27 ENCOUNTER — Other Ambulatory Visit: Payer: Self-pay | Admitting: Gynecology

## 2012-04-01 DIAGNOSIS — M722 Plantar fascial fibromatosis: Secondary | ICD-10-CM | POA: Diagnosis not present

## 2012-04-10 DIAGNOSIS — M722 Plantar fascial fibromatosis: Secondary | ICD-10-CM | POA: Diagnosis not present

## 2012-04-11 DIAGNOSIS — M722 Plantar fascial fibromatosis: Secondary | ICD-10-CM | POA: Diagnosis not present

## 2012-04-14 DIAGNOSIS — M722 Plantar fascial fibromatosis: Secondary | ICD-10-CM | POA: Diagnosis not present

## 2012-04-15 DIAGNOSIS — M722 Plantar fascial fibromatosis: Secondary | ICD-10-CM | POA: Diagnosis not present

## 2012-04-17 DIAGNOSIS — M722 Plantar fascial fibromatosis: Secondary | ICD-10-CM | POA: Diagnosis not present

## 2012-04-21 DIAGNOSIS — M722 Plantar fascial fibromatosis: Secondary | ICD-10-CM | POA: Diagnosis not present

## 2012-04-28 ENCOUNTER — Emergency Department (HOSPITAL_COMMUNITY)
Admission: EM | Admit: 2012-04-28 | Discharge: 2012-04-29 | Disposition: A | Discharge status: Home / Self Care | Payer: Medicare Other | Attending: Emergency Medicine | Admitting: Emergency Medicine

## 2012-04-28 ENCOUNTER — Encounter (HOSPITAL_COMMUNITY): Payer: Self-pay | Admitting: Emergency Medicine

## 2012-04-28 DIAGNOSIS — R141 Gas pain: Secondary | ICD-10-CM | POA: Insufficient documentation

## 2012-04-28 DIAGNOSIS — I2510 Atherosclerotic heart disease of native coronary artery without angina pectoris: Secondary | ICD-10-CM | POA: Insufficient documentation

## 2012-04-28 DIAGNOSIS — K2970 Gastritis, unspecified, without bleeding: Secondary | ICD-10-CM | POA: Diagnosis not present

## 2012-04-28 DIAGNOSIS — Z9851 Tubal ligation status: Secondary | ICD-10-CM | POA: Insufficient documentation

## 2012-04-28 DIAGNOSIS — Z8669 Personal history of other diseases of the nervous system and sense organs: Secondary | ICD-10-CM | POA: Diagnosis not present

## 2012-04-28 DIAGNOSIS — R142 Eructation: Secondary | ICD-10-CM | POA: Insufficient documentation

## 2012-04-28 DIAGNOSIS — Z9861 Coronary angioplasty status: Secondary | ICD-10-CM | POA: Insufficient documentation

## 2012-04-28 DIAGNOSIS — Z862 Personal history of diseases of the blood and blood-forming organs and certain disorders involving the immune mechanism: Secondary | ICD-10-CM | POA: Diagnosis not present

## 2012-04-28 DIAGNOSIS — R112 Nausea with vomiting, unspecified: Secondary | ICD-10-CM

## 2012-04-28 DIAGNOSIS — Z923 Personal history of irradiation: Secondary | ICD-10-CM | POA: Diagnosis not present

## 2012-04-28 DIAGNOSIS — Z79899 Other long term (current) drug therapy: Secondary | ICD-10-CM | POA: Insufficient documentation

## 2012-04-28 DIAGNOSIS — Z8679 Personal history of other diseases of the circulatory system: Secondary | ICD-10-CM | POA: Diagnosis not present

## 2012-04-28 DIAGNOSIS — R143 Flatulence: Secondary | ICD-10-CM | POA: Diagnosis not present

## 2012-04-28 DIAGNOSIS — I509 Heart failure, unspecified: Secondary | ICD-10-CM | POA: Insufficient documentation

## 2012-04-28 DIAGNOSIS — C189 Malignant neoplasm of colon, unspecified: Secondary | ICD-10-CM | POA: Diagnosis not present

## 2012-04-28 LAB — POCT I-STAT TROPONIN I: Troponin i, poc: 0 ng/mL (ref 0.00–0.08)

## 2012-04-28 LAB — COMPREHENSIVE METABOLIC PANEL
ALT: 17 U/L (ref 0–35)
AST: 21 U/L (ref 0–37)
Albumin: 4 g/dL (ref 3.5–5.2)
Alkaline Phosphatase: 71 U/L (ref 39–117)
BUN: 17 mg/dL (ref 6–23)
CO2: 25 mEq/L (ref 19–32)
Calcium: 9.6 mg/dL (ref 8.4–10.5)
Chloride: 102 mEq/L (ref 96–112)
Creatinine, Ser: 0.85 mg/dL (ref 0.50–1.10)
GFR calc Af Amer: 79 mL/min — ABNORMAL LOW (ref >90)
GFR calc non Af Amer: 68 mL/min — ABNORMAL LOW (ref >90)
Glucose, Bld: 122 mg/dL — ABNORMAL HIGH (ref 70–99)
Potassium: 3.8 mEq/L (ref 3.5–5.1)
Sodium: 140 mEq/L (ref 135–145)
Total Bilirubin: 0.3 mg/dL (ref 0.3–1.2)
Total Protein: 7.2 g/dL (ref 6.0–8.3)

## 2012-04-28 LAB — CBC
HCT: 37.5 % (ref 36.0–46.0)
Hemoglobin: 12.6 g/dL (ref 12.0–15.0)
MCH: 30.9 pg (ref 26.0–34.0)
MCHC: 33.6 g/dL (ref 30.0–36.0)
MCV: 91.9 fL (ref 78.0–100.0)
Platelets: 239 10*3/uL (ref 150–400)
RBC: 4.08 MIL/uL (ref 3.87–5.11)
RDW: 12.6 % (ref 11.5–15.5)
WBC: 8.5 10*3/uL (ref 4.0–10.5)

## 2012-04-28 MED ORDER — PANTOPRAZOLE SODIUM 40 MG IV SOLR
40.0000 mg | Freq: Once | INTRAVENOUS | Status: AC
  Administered 2012-04-28: 40 mg via INTRAVENOUS
  Filled 2012-04-28: qty 40

## 2012-04-28 MED ORDER — ONDANSETRON HCL 4 MG/2ML IJ SOLN
4.0000 mg | Freq: Once | INTRAMUSCULAR | Status: AC
  Administered 2012-04-28: 4 mg via INTRAVENOUS
  Filled 2012-04-28: qty 2

## 2012-04-28 NOTE — ED Notes (Signed)
Pt tolerating clear liquids without nausea or vomiting. C/o burping.

## 2012-04-28 NOTE — ED Notes (Signed)
C/o nausea, vomiting, and diarrhea. EMS reports no vomiting.

## 2012-04-28 NOTE — ED Provider Notes (Signed)
History     CSN: [id number]  Arrival date & time 04/28/12  2107   First MD Initiated Contact with Patient 04/28/12 2154      Chief Complaint  Patient presents with  . Nausea    (Consider location/radiation/quality/duration/timing/severity/associated sxs/prior treatment) HPI Comments: Patient with nausea and vomiting 3 episodes since 6 PM, denies diarrhea.  Stets she has "very bad reflux" that haas gotten worse recently but has not had the time to see Dr. Loreta Ave.  States she has been unable to take her medications tonight Last BM Saturday normal appetite has been good until this evening   Last seen by PCP in September without any change in medications   The history is provided by the patient.    Past Medical History  Diagnosis Date  . Conjunctivitis   . Febrile neutropenia   . Anemia   . Hypokalemia   . Mucositis   . LBBB (left bundle branch block)   . Nonischemic cardiomyopathy     EF 30 to 35%  . S/P cardiac cath 2000    Normal coronaries  . CHD (coronary heart disease)   . Renal artery aneurysm   . CHF (congestive heart failure)   . Cancer of colon 08/2005    STAGE 1 CHEMO AND RADIATION  . Sleep apnea     Past Surgical History  Procedure Date  . Appendectomy   . Cesarean section   . Cholecystectomy   . Tonsillectomy   . Port-a-cath removal   . US echocardiography 3/12    EF 30 to 35%  . Hemorroidectomy   . Tubal ligation     Family History  Problem Relation Age of Onset  . Diabetes Sister   . Cancer Brother     PROSTATE    History  Substance Use Topics  . Smoking status: Never Smoker   . Smokeless tobacco: Never Used  . Alcohol Use: No    OB History    Grav Para Term Preterm Abortions TAB SAB Ect Mult Living   2 2 2       2       Review of Systems  Constitutional: Negative for fever and chills.  HENT: Negative for congestion and rhinorrhea.   Respiratory: Negative for cough and shortness of breath.   Cardiovascular: Negative for chest pain  and leg swelling.  Gastrointestinal: Positive for nausea and vomiting. Negative for abdominal pain, diarrhea and constipation.  Genitourinary: Negative for dysuria and frequency.  Musculoskeletal: Negative for back pain.  Skin: Negative for pallor and rash.  Neurological: Negative for dizziness, weakness and headaches.    Allergies  Codeine and Iohexol  Home Medications   Current Outpatient Rx  Name  Route  Sig  Dispense  Refill  . CARVEDILOL 25 MG PO TABS   Oral   Take 2 tablets (50 mg total) by mouth daily.   60 tablet   5   . ZYRTEC PO   Oral   Take 1 tablet by mouth daily.          Marland Kitchen FAMOTIDINE 10 MG PO TABS   Oral   Take 10 mg by mouth 2 (two) times daily.         Marland Kitchen LOSARTAN POTASSIUM 50 MG PO TABS   Oral   Take 1 tablet (50 mg total) by mouth daily.   90 tablet   3   . REQUIP PO   Oral   Take by mouth.         Marland Kitchen  VITAMIN B-12 1000 MCG PO TABS   Oral   Take 1,000 mcg by mouth daily.         Marland Kitchen CITALOPRAM HYDROBROMIDE 40 MG PO TABS   Oral   Take 40 mg by mouth as needed. 1 tab po qd/ pt not taking regularly          . VALACYCLOVIR HCL 500 MG PO TABS      TAKE 1 TABLET BY MOUTH TWICE A DAY FOR 5 DAYS   10 tablet   5   . SONATA PO   Oral   Take 10 mg by mouth daily.            BP 137/90  Pulse 72  Temp 98.6 F (37 C) (Oral)  Resp 18  SpO2 97%  Physical Exam  Constitutional: She is oriented to person, place, and time. She appears well-developed and well-nourished.  HENT:  Head: Normocephalic.  Eyes: Pupils are equal, round, and reactive to light.  Neck: Normal range of motion.  Cardiovascular: Normal heart sounds.   Pulmonary/Chest: Effort normal and breath sounds normal. No respiratory distress. She has no wheezes.  Abdominal: Soft. She exhibits no distension. There is no tenderness.  Musculoskeletal: Normal range of motion. She exhibits no edema.  Neurological: She is alert and oriented to person, place, and time.  Skin: Skin  is warm and dry. No pallor.    ED Course  Procedures (including critical care time)  Labs Reviewed  COMPREHENSIVE METABOLIC PANEL - Abnormal; Notable for the following:    Glucose, Bld 122 (*)     GFR calc non Af Amer 68 (*)     GFR calc Af Amer 79 (*)     All other components within normal limits  CBC  POCT I-STAT TROPONIN I   No results found.   1. Belching   2. Nausea & vomiting     ED ECG REPORT   Date: 04/29/2012  EKG Time: 1:13 AM  Rate: 81  Rhythm: normal sinus rhythm,  unchanged from previous tracings  Axis: normal  Intervals:none  ST&T Change: borderline prolonged QT but improved from last 06/04/11/  Narrative Interpretation: borderline            MDM  Will check EKG, troponin, cbc c met treat with IV Zofran and Protonix   Awaiting lab results Patient reports relief of her symptoms after IV Zofran and Protonix Able to tolerate PO fluids      Arman Filter, NP 04/29/12 0113

## 2012-04-28 NOTE — ED Notes (Signed)
Pt states she has been having trouble with her acid reflux and had already taken her med. Started having nausea and vomiting around 1700 this evening and could not get in touch with her family so she called EMS.

## 2012-04-29 NOTE — ED Provider Notes (Signed)
Medical screening examination/treatment/procedure(s) were performed by non-physician practitioner and as supervising physician I was immediately available for consultation/collaboration.  Flint Melter, MD 04/29/12 [phone]

## 2012-04-29 NOTE — ED Provider Notes (Signed)
Samantha [name] is a 71 y.o. female was here to be evaluated for belching. She's had her gallbladder, and appendix taken out. She feels like there is a residual problem, after her gallbladder was removed. She stopped her surgeon about this but he does not is a complication. She's had a colonoscopy, but not an upper endoscopy. She sees her GI Dr. Regularly.  On examination. She is very anxious. She was initially very hypertensive, but it improves spontaneously.  Patient's belching, improved after treatment with PPI, and Zofran.  Her symptoms are nonspecific and may be related to reflux disease.  Assessment: Belching. Doubt metabolic instability, serious bacterial infection or impending vascular collapse; the patient is stable for discharge.  Flint Melter, MD 04/29/12 [phone]

## 2012-05-20 DIAGNOSIS — Z85048 Personal history of other malignant neoplasm of rectum, rectosigmoid junction, and anus: Secondary | ICD-10-CM | POA: Diagnosis not present

## 2012-05-20 DIAGNOSIS — K219 Gastro-esophageal reflux disease without esophagitis: Secondary | ICD-10-CM | POA: Diagnosis not present

## 2012-05-20 DIAGNOSIS — R110 Nausea: Secondary | ICD-10-CM | POA: Diagnosis not present

## 2012-05-20 DIAGNOSIS — K5900 Constipation, unspecified: Secondary | ICD-10-CM | POA: Diagnosis not present

## 2012-06-10 ENCOUNTER — Ambulatory Visit: Payer: Medicare Other | Admitting: Gynecology

## 2012-06-20 DIAGNOSIS — D235 Other benign neoplasm of skin of trunk: Secondary | ICD-10-CM | POA: Diagnosis not present

## 2012-06-20 DIAGNOSIS — L259 Unspecified contact dermatitis, unspecified cause: Secondary | ICD-10-CM | POA: Diagnosis not present

## 2012-06-20 DIAGNOSIS — D485 Neoplasm of uncertain behavior of skin: Secondary | ICD-10-CM | POA: Diagnosis not present

## 2012-08-01 ENCOUNTER — Ambulatory Visit: Payer: Medicare Other | Admitting: Cardiovascular Disease

## 2012-08-11 ENCOUNTER — Ambulatory Visit (INDEPENDENT_AMBULATORY_CARE_PROVIDER_SITE_OTHER): Payer: Medicare Other | Admitting: Gynecology

## 2012-08-11 ENCOUNTER — Encounter: Payer: Self-pay | Admitting: Gynecology

## 2012-08-11 VITALS — BP 136/80

## 2012-08-11 DIAGNOSIS — R300 Dysuria: Secondary | ICD-10-CM

## 2012-08-11 DIAGNOSIS — N390 Urinary tract infection, site not specified: Secondary | ICD-10-CM | POA: Diagnosis not present

## 2012-08-11 LAB — URINALYSIS W MICROSCOPIC + REFLEX CULTURE
Bilirubin Urine: NEGATIVE
Casts: NONE SEEN
Crystals: NONE SEEN
Glucose, UA: NEGATIVE mg/dL
Ketones, ur: NEGATIVE mg/dL
Nitrite: NEGATIVE
Protein, ur: NEGATIVE mg/dL
Specific Gravity, Urine: 1.01 (ref 1.005–1.030)
Urobilinogen, UA: 0.2 mg/dL (ref 0.0–1.0)
pH: 7 (ref 5.0–8.0)

## 2012-08-11 MED ORDER — NITROFURANTOIN MONOHYD MACRO 100 MG PO CAPS: 100.0000 mg | ORAL_CAPSULE | Freq: Two times a day (BID) | ORAL | Status: DC

## 2012-08-11 NOTE — Progress Notes (Signed)
Patient presents with 24-hour history of worsening urgency, dysuria and frequency. No fever chills nausea vomiting diarrhea constipation or other constitutional symptoms. No recent history of UTIs. No overt precipitating events.  Exam Spine straight no CVA tenderness Abdomen soft nontender without masses guarding rebound organomegaly  Urinalysis with 11-20 WBC, 11-20 RBC rare bacteria.  Assessment and plan: UTI.  Urogesig samples given to take one by mouth Q6 hours to help with her symptoms and Macrobid 100 mg twice a day x7 days. Follow up if symptoms persist, worsen or recur.

## 2012-08-11 NOTE — Patient Instructions (Signed)
Take Urogesic samples 1 every 6 hours to help numb your bladder. Take Macrobid antibiotic twice daily for 7 days. Follow up if your symptoms persist, worsen or recur.

## 2012-08-14 LAB — URINE CULTURE: Colony Count: 75000

## 2012-09-12 ENCOUNTER — Other Ambulatory Visit: Payer: Self-pay | Admitting: *Deleted

## 2012-09-12 MED ORDER — CARVEDILOL 25 MG PO TABS: 50.0000 mg | ORAL_TABLET | Freq: Every day | ORAL | Status: DC

## 2012-09-12 NOTE — Telephone Encounter (Signed)
Fax Received. Refill Completed. Giuseppina Quinones Chowoe (R.M.A)   

## 2012-09-17 ENCOUNTER — Telehealth: Payer: Self-pay | Admitting: Oncology

## 2012-09-17 DIAGNOSIS — H18519 Endothelial corneal dystrophy, unspecified eye: Secondary | ICD-10-CM | POA: Diagnosis not present

## 2012-09-17 DIAGNOSIS — H259 Unspecified age-related cataract: Secondary | ICD-10-CM | POA: Diagnosis not present

## 2012-09-17 DIAGNOSIS — H1030 Unspecified acute conjunctivitis, unspecified eye: Secondary | ICD-10-CM | POA: Diagnosis not present

## 2012-09-17 NOTE — Telephone Encounter (Signed)
s/w pt and advised on moving Dr. Truett Perna appt to may 12th...pt ok

## 2012-09-18 ENCOUNTER — Ambulatory Visit (INDEPENDENT_AMBULATORY_CARE_PROVIDER_SITE_OTHER): Payer: Medicare Other | Admitting: Cardiovascular Disease

## 2012-09-18 ENCOUNTER — Encounter: Payer: Self-pay | Admitting: Cardiovascular Disease

## 2012-09-18 VITALS — BP 130/70 | HR 66 | Ht 65.0 in | Wt 180.0 lb

## 2012-09-18 DIAGNOSIS — I509 Heart failure, unspecified: Secondary | ICD-10-CM | POA: Diagnosis not present

## 2012-09-18 DIAGNOSIS — I5022 Chronic systolic (congestive) heart failure: Secondary | ICD-10-CM

## 2012-09-18 NOTE — Progress Notes (Signed)
Samantha [name] Kitchen    Samantha [name] Date of Birth  [birth year]       [facility]    [place] [address], [address]  [address], [address] [place], [place]  [address]   [place], [place]  [address] [phone]     [phone]   Fax  [phone]    Fax [phone]  Problem List: 1. Congestive heart failure- original EF 30-35%, now EF = 45-50% 2. History colon cancer-status post radiation and chemotherapy-April 2007 3. Smooth and normal coronary arteries by heart catheterization in 2000  History of Present Illness:  She has done well from a cardiac stand point.  She has had some dizziness and found her BP to be low yesterday.  She has occasional palpitations and frequently finds that her heart rate is about 100 with minimal exertion.  September 18, 2012:  Samantha [name] is doing well.  Still has some dyspnea - especially when bending over or stooping over.    No chest pain.  She eats an ice cream sundae every day, sometimes twice a day.   She still eats fast food almost all of the time.  She had an EF of 35% several years ago.  Her EF has improved to 45-50% with medical therapy.   Current Outpatient Prescriptions on File Prior to Visit  Medication Sig Dispense Refill  . carvedilol (COREG) 25 MG tablet Take 2 tablets (50 mg total) by mouth daily.  60 tablet  2  . Cetirizine HCl (ZYRTEC PO) Take 1 tablet by mouth daily.       . citalopram (CELEXA) 40 MG tablet Take 40 mg by mouth as needed. 1 tab po qd/ pt not taking regularly       . famotidine (PEPCID) 10 MG tablet Take 10 mg by mouth 2 (two) times daily.      Samantha [name] Kitchen losartan (COZAAR) 50 MG tablet Take 1 tablet (50 mg total) by mouth daily.  90 tablet  3  . nitrofurantoin, macrocrystal-monohydrate, (MACROBID) 100 MG capsule Take 1 capsule (100 mg total) by mouth 2 (two) times daily. For 7 days  14 capsule  0  . ROPINIRole HCl (REQUIP PO) Take by mouth.      . vitamin B-12 (CYANOCOBALAMIN) 1000 MCG tablet Take 1,000 mcg by mouth daily.       . Zaleplon (SONATA PO) Take 10 mg by mouth daily.        No current facility-administered medications on file prior to visit.    Allergies  Allergen Reactions  . Codeine     Upset stomach    . Iohexol      Desc: Pt. states she broke out in hives 30 yrs ago w/ a CT Brain scan.  She has since had a heart cath and this CT today (08/10/05) w/o premeds and did well. No evident reaction.  Thanks.     Past Medical History  Diagnosis Date  . Conjunctivitis   . Febrile neutropenia   . Anemia   . Hypokalemia   . Mucositis   . LBBB (left bundle branch block)   . Nonischemic cardiomyopathy     EF 30 to 35%  . S/P cardiac cath 2000    Normal coronaries  . CHD (coronary heart disease)   . Renal artery aneurysm   . CHF (congestive heart failure)   . Cancer of colon 08/2005    STAGE 1 CHEMO AND RADIATION  . Sleep apnea     Past Surgical History  Procedure  Laterality Date  . Appendectomy    . Cesarean section    . Cholecystectomy    . Tonsillectomy    . Port-a-cath removal    . US echocardiography  3/12    EF 30 to 35%  . Hemorroidectomy    . Tubal ligation      History  Smoking status  . Never Smoker   Smokeless tobacco  . Never Used    History  Alcohol Use No    Family History  Problem Relation Age of Onset  . Diabetes Sister   . Cancer Brother     PROSTATE    Reviw of Systems:  Reviewed in the HPI.  All other systems are negative.  Physical Exam: Blood pressure 130/70, pulse 66, height 5\' 5"  (1.651 m), weight 180 lb (81.647 kg), SpO2 96.00%. General: Well developed, well nourished, in no acute distress.  Head: Normocephalic, atraumatic, sclera non-icteric, mucus membranes are moist,   Neck: Supple. Carotids are 2 + without bruits. No JVD  Lungs: Clear bilaterally to auscultation.  Heart: regular rate.  normal  S1 S2. No murmurs, gallops or rubs.  Abdomen: Soft, non-tender, non-distended with normal bowel sounds. No hepatomegaly. No rebound/guarding.  No masses.  Msk:  Strength and tone are normal  Extremities: No clubbing or cyanosis. No edema.  Distal pedal pulses are 2+ and equal bilaterally.  Neuro: Alert and oriented X 3. Moves all extremities spontaneously.  Psych:  Responds to questions appropriately with a normal affect.  ECG:  Assessment / Plan:

## 2012-09-18 NOTE — Patient Instructions (Addendum)
Your physician wants you to follow-up in: 6 MONTHS WITH EKG  You will receive a reminder letter in the mail two months in advance. If you don't receive a letter, please call our office to schedule the follow-up appointment.  Your physician recommends that you return for a FASTING lipid profile: 6 MONTHS   Your physician recommends that you continue on your current medications as directed. Please refer to the Current Medication list given to you today.  REDUCE HIGH SODIUM FOODS LIKE CANNED SOUP, GRAVY, SAUCES, READY PREPARED FOODS LIKE FROZEN FOODS; LEAN CUISINE, LASAGNA. BACON, SAUSAGE, LUNCH MEAT, FAST FOODS.Marland Kitchen CHIPS AND HOTDOGS   DASH Diet The DASH diet stands for "Dietary Approaches to Stop Hypertension." It is a healthy eating plan that has been shown to reduce high blood pressure (hypertension) in as little as 14 days, while also possibly providing other significant health benefits. These other health benefits include reducing the risk of breast cancer after menopause and reducing the risk of type 2 diabetes, heart disease, colon cancer, and stroke. Health benefits also include weight loss and slowing kidney failure in patients with chronic kidney disease.  DIET GUIDELINES  Limit salt (sodium). Your diet should contain less than 1500 mg of sodium daily.  Limit refined or processed carbohydrates. Your diet should include mostly whole grains. Desserts and added sugars should be used sparingly.  Include small amounts of heart-healthy fats. These types of fats include nuts, oils, and tub margarine. Limit saturated and trans fats. These fats have been shown to be harmful in the body. CHOOSING FOODS  The following food groups are based on a 2000 calorie diet. See your Registered Dietitian for individual calorie needs. Grains and Grain Products (6 to 8 servings daily)  Eat More Often: Whole-wheat bread, brown rice, whole-grain or wheat pasta, quinoa, popcorn without added fat or salt (air  popped).  Eat Less Often: White bread, white pasta, white rice, cornbread. Vegetables (4 to 5 servings daily)  Eat More Often: Fresh, frozen, and canned vegetables. Vegetables may be raw, steamed, roasted, or grilled with a minimal amount of fat.  Eat Less Often/Avoid: Creamed or fried vegetables. Vegetables in a cheese sauce. Fruit (4 to 5 servings daily)  Eat More Often: All fresh, canned (in natural juice), or frozen fruits. Dried fruits without added sugar. One hundred percent fruit juice ( cup [237 mL] daily).  Eat Less Often: Dried fruits with added sugar. Canned fruit in light or heavy syrup. [employer], Fish, and Poultry (2 servings or less daily. One serving is 3 to 4 oz [85-114 g]).  Eat More Often: Ninety percent or leaner ground beef, tenderloin, sirloin. Round cuts of beef, chicken breast, [place] breast. All fish. Grill, bake, or broil your meat. Nothing should be fried.  Eat Less Often/Avoid: Fatty cuts of meat, [place], or chicken leg, thigh, or wing. Fried cuts of meat or fish. Dairy (2 to 3 servings)  Eat More Often: Low-fat or fat-free milk, low-fat plain or light yogurt, reduced-fat or part-skim cheese.  Eat Less Often/Avoid: Milk (whole, 2%).Whole milk yogurt. Full-fat cheeses. Nuts, Seeds, and Legumes (4 to 5 servings per week)  Eat More Often: All without added salt.  Eat Less Often/Avoid: Salted nuts and seeds, canned beans with added salt. Fats and Sweets (limited)  Eat More Often: Vegetable oils, tub margarines without trans fats, sugar-free gelatin. Mayonnaise and salad dressings.  Eat Less Often/Avoid: Coconut oils, palm oils, butter, stick margarine, cream, half and half, cookies, candy, pie. FOR MORE INFORMATION  The Dash Diet Eating Plan: www.dashdiet.org Document Released: 05/24/2011 Document Revised: 08/27/2011 Document Reviewed: 05/24/2011 [facility] Patient Information 2013 [place], [place].

## 2012-09-18 NOTE — Assessment & Plan Note (Signed)
Samantha [name] presents for followup of her congestive heart failure. She is doing fairly well. She's not having any dyspnea. She does point out that she short of breath when she bends over.  She eats ice cream sundaes once a day-sometimes twice a day. She eats fast food almost all the time. I've encouraged her to try to clip or food inside her to try to avoid eating any extra salt.  We'll continue with her same medications. I'll see her again in 6 months for followup office visit, fasting lipid profile, hepatic profile, basic metabolic profile, and EKG.

## 2012-09-22 DIAGNOSIS — M25579 Pain in unspecified ankle and joints of unspecified foot: Secondary | ICD-10-CM | POA: Diagnosis not present

## 2012-09-25 ENCOUNTER — Other Ambulatory Visit: Payer: Self-pay | Admitting: *Deleted

## 2012-09-25 DIAGNOSIS — C210 Malignant neoplasm of anus, unspecified: Secondary | ICD-10-CM

## 2012-09-26 ENCOUNTER — Other Ambulatory Visit: Payer: Medicare Other | Admitting: Lab

## 2012-09-26 ENCOUNTER — Other Ambulatory Visit (HOSPITAL_COMMUNITY): Payer: Medicare Other

## 2012-09-26 ENCOUNTER — Telehealth: Payer: Self-pay | Admitting: *Deleted

## 2012-09-26 ENCOUNTER — Ambulatory Visit (HOSPITAL_COMMUNITY): Payer: Medicare Other

## 2012-09-26 NOTE — Telephone Encounter (Signed)
After speaking with Dr. Truett Perna, called patient to cancel her imaging and labs for today. Follow up on 5/12 as scheduled and then further testing needs will be determined.

## 2012-09-29 ENCOUNTER — Ambulatory Visit: Payer: Self-pay | Admitting: Oncology

## 2012-09-30 ENCOUNTER — Ambulatory Visit: Payer: Medicare Other | Admitting: Hematology and Oncology

## 2012-10-09 DIAGNOSIS — E559 Vitamin D deficiency, unspecified: Secondary | ICD-10-CM | POA: Diagnosis not present

## 2012-10-09 DIAGNOSIS — Z79899 Other long term (current) drug therapy: Secondary | ICD-10-CM | POA: Diagnosis not present

## 2012-10-09 DIAGNOSIS — R82998 Other abnormal findings in urine: Secondary | ICD-10-CM | POA: Diagnosis not present

## 2012-10-24 DIAGNOSIS — J309 Allergic rhinitis, unspecified: Secondary | ICD-10-CM | POA: Diagnosis not present

## 2012-10-24 DIAGNOSIS — G4733 Obstructive sleep apnea (adult) (pediatric): Secondary | ICD-10-CM | POA: Diagnosis not present

## 2012-10-24 DIAGNOSIS — M1990 Unspecified osteoarthritis, unspecified site: Secondary | ICD-10-CM | POA: Diagnosis not present

## 2012-10-24 DIAGNOSIS — D649 Anemia, unspecified: Secondary | ICD-10-CM | POA: Diagnosis not present

## 2012-10-24 DIAGNOSIS — I059 Rheumatic mitral valve disease, unspecified: Secondary | ICD-10-CM | POA: Diagnosis not present

## 2012-10-24 DIAGNOSIS — M949 Disorder of cartilage, unspecified: Secondary | ICD-10-CM | POA: Diagnosis not present

## 2012-10-24 DIAGNOSIS — M899 Disorder of bone, unspecified: Secondary | ICD-10-CM | POA: Diagnosis not present

## 2012-10-24 DIAGNOSIS — Z0000 Encounter for general adult medical examination without abnormal findings: Secondary | ICD-10-CM | POA: Diagnosis not present

## 2012-10-24 DIAGNOSIS — Z1331 Encounter for screening for depression: Secondary | ICD-10-CM | POA: Diagnosis not present

## 2012-10-24 DIAGNOSIS — E559 Vitamin D deficiency, unspecified: Secondary | ICD-10-CM | POA: Diagnosis not present

## 2012-10-24 DIAGNOSIS — I722 Aneurysm of renal artery: Secondary | ICD-10-CM | POA: Diagnosis not present

## 2012-10-27 ENCOUNTER — Telehealth: Payer: Self-pay | Admitting: Oncology

## 2012-10-27 ENCOUNTER — Ambulatory Visit (HOSPITAL_BASED_OUTPATIENT_CLINIC_OR_DEPARTMENT_OTHER): Payer: Medicare Other | Admitting: Oncology

## 2012-10-27 VITALS — BP 113/49 | HR 67 | Temp 97.7°F | Resp 18 | Ht 65.0 in | Wt 179.2 lb

## 2012-10-27 DIAGNOSIS — Z85048 Personal history of other malignant neoplasm of rectum, rectosigmoid junction, and anus: Secondary | ICD-10-CM | POA: Diagnosis not present

## 2012-10-27 DIAGNOSIS — C210 Malignant neoplasm of anus, unspecified: Secondary | ICD-10-CM

## 2012-10-27 NOTE — Progress Notes (Signed)
   [facility]    OFFICE PROGRESS NOTE   INTERVAL HISTORY:   She returns as scheduled. No difficulty with bowel function. She was diagnosed with anal cancer in 2007. She last underwent a colonoscopy by Dr. Loreta Ave in August of 2012. Multiple polyps were removed. She reports intermittent discomfort in the right lower abdomen.   Objective:  Vital signs in last 24 hours:  Blood pressure 113/49, pulse 67, temperature 97.7 F (36.5 C), temperature source Oral, resp. rate 18, height 5\' 5"  (1.651 m), weight 179 lb 3.2 oz (81.285 kg).    HEENT: Neck without mass Lymphatics: No cervical, supra-clavicular, axillary, or inguinal nodes Resp: Lungs clear bilaterally Cardio: Regular rate and rhythm GI: No hepatosplenomegaly, nontender, no mass. Rectal -small hemorrhoids at the anal verge. No mass at the anal margin or anal canal.? Mild anal stenosis Vascular: No leg edema  Skin: Radiation hyperpigmentation at the perineum.   Portacath/PICC-without erythema  Lab Results:  Lab Results  Component Value Date   WBC 8.5 04/28/2012   HGB 12.6 04/28/2012   HCT 37.5 04/28/2012   MCV 91.9 04/28/2012   PLT 239 04/28/2012      Medications: I have reviewed the patient's current medications.  Assessment/Plan:  1. 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 History of congestive heart failure 3. right renal artery aneurysm 4. Colon polyps-she will continue surveillance colonoscopies with Dr. Loreta Ave  Disposition:  She remains in clinical remission from anal cancer. She has a good prognosis for a long-term disease-free survival. She plans to continue clinical followup with doctors Tisovec and Loreta Ave. She is not scheduled for a followup appointment in the oncology clinic. We will be glad to see her in the future as needed.   Thornton Papas, MD  10/27/2012  10:08 AM

## 2012-10-27 NOTE — Telephone Encounter (Signed)
Per 5/12 pof TBA. Pt aware.

## 2012-10-28 DIAGNOSIS — M899 Disorder of bone, unspecified: Secondary | ICD-10-CM | POA: Diagnosis not present

## 2012-10-28 DIAGNOSIS — Z1212 Encounter for screening for malignant neoplasm of rectum: Secondary | ICD-10-CM | POA: Diagnosis not present

## 2012-11-01 ENCOUNTER — Other Ambulatory Visit: Payer: Self-pay | Admitting: Gynecology

## 2012-11-13 DIAGNOSIS — Z1231 Encounter for screening mammogram for malignant neoplasm of breast: Secondary | ICD-10-CM | POA: Diagnosis not present

## 2012-11-17 ENCOUNTER — Encounter: Payer: Self-pay | Admitting: Gynecology

## 2012-11-24 ENCOUNTER — Ambulatory Visit (INDEPENDENT_AMBULATORY_CARE_PROVIDER_SITE_OTHER): Payer: Medicare Other | Admitting: Nurse Practitioner

## 2012-11-24 ENCOUNTER — Telehealth: Payer: Self-pay | Admitting: Neurology

## 2012-11-24 ENCOUNTER — Encounter: Payer: Self-pay | Admitting: Nurse Practitioner

## 2012-11-24 VITALS — BP 109/58 | HR 62 | Ht 65.5 in | Wt 180.0 lb

## 2012-11-24 DIAGNOSIS — G2581 Restless legs syndrome: Secondary | ICD-10-CM | POA: Diagnosis not present

## 2012-11-24 DIAGNOSIS — G250 Essential tremor: Secondary | ICD-10-CM

## 2012-11-24 DIAGNOSIS — G252 Other specified forms of tremor: Secondary | ICD-10-CM | POA: Diagnosis not present

## 2012-11-24 DIAGNOSIS — G4733 Obstructive sleep apnea (adult) (pediatric): Secondary | ICD-10-CM | POA: Diagnosis not present

## 2012-11-24 MED ORDER — ROPINIROLE HCL 0.5 MG PO TABS: 0.5000 mg | ORAL_TABLET | Freq: Every day | ORAL | Status: DC

## 2012-11-24 MED ORDER — ZALEPLON 10 MG PO CAPS: 10.0000 mg | ORAL_CAPSULE | ORAL | Status: DC | PRN

## 2012-11-24 NOTE — Progress Notes (Signed)
HPI: Patient returns for followup after prior visits with Dr. Vickey Huger. She was last seen 11/22/2011. She has a history of sleep apnea which was diagnosed during PSG 08/15/2011. She currently uses a nasal pillow and  humidity. She also has insomnia at baseline and uses Sonata when necessary. She has  a history of restless leg syndrome and and is currently on Requip 0.5 daily which has been very beneficial. She claims she uses her CPAP every night,  her last download was June 2013, she did not bring in machine for download. ESS today is 6.    ROS:  Restless legs, needs refills  Physical Exam General: well developed, well nourished, seated, in no evident distress Head: head normocephalic and atraumatic. Oropharynx benign Neck: supple with no carotid  bruits Cardiovascular: regular rate and rhythm, no murmurs  Neurologic Exam Mental Status: Awake and fully alert. Oriented to place and time. Follows all commands. Speech and language normal.  ESS 6.  Cranial Nerves:  Pupils equal, briskly reactive to light. Extraocular movements full without nystagmus. Visual fields full to confrontation. Hearing decreased on the right. Face, tongue, palate move normally and symmetrically. Neck flexion and extension normal.  Motor: Normal bulk and tone. Normal strength in all tested extremity muscles.No focal weakness. 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 .  Sensory.: intact to touch and pinprick and vibratory.  Coordination: Rapid alternating movements normal in all extremities. Finger-to-nose and heel-to-shin performed accurately bilaterally. No dysmetria Gait and Station: Arises from chair without difficulty. Stance is normal. Gait demonstrates normal stride length and balance . Able to heel, toe and tandem walk without difficulty.  Reflexes: 2+ and symmetric. Toes downgoing.     ASSESSMENT: Obstructive sleep apnea diagnosed 08/15/2011, currently using nasal pillow. No download in one year ESS 6 today.  History of  restless legs well controlled with Requip Insomnia well controlled with Sonata when necessary    PLAN: Zaleplon reordered Requip reordered. Bring in CPAP machine for download Followup yearly  Nilda Riggs, GNP-BC APRN

## 2012-11-24 NOTE — Telephone Encounter (Signed)
[name] returned today after her initial clinic visit with her CPAP machine for a download. data reviewed, and compliance are satisfactory.Calvin Jablonowski, MD

## 2012-11-24 NOTE — Patient Instructions (Addendum)
Zaleplon reordered Requip reordered. Bring in CPAP machine for download- at least once a year . We need the machine now for June 2014 download.  Followup yearly

## 2012-12-04 DIAGNOSIS — H5200 Hypermetropia, unspecified eye: Secondary | ICD-10-CM | POA: Diagnosis not present

## 2012-12-04 DIAGNOSIS — H18519 Endothelial corneal dystrophy, unspecified eye: Secondary | ICD-10-CM | POA: Diagnosis not present

## 2012-12-04 DIAGNOSIS — H2510 Age-related nuclear cataract, unspecified eye: Secondary | ICD-10-CM | POA: Diagnosis not present

## 2012-12-06 ENCOUNTER — Encounter: Payer: Self-pay | Admitting: Neurology

## 2012-12-22 ENCOUNTER — Other Ambulatory Visit: Payer: Self-pay | Admitting: Cardiovascular Disease

## 2012-12-22 NOTE — Telephone Encounter (Signed)
Fax Received. Refill Completed. Samantha [name] (R.M.A)   

## 2012-12-25 ENCOUNTER — Encounter: Payer: Self-pay | Admitting: Gynecology

## 2012-12-25 ENCOUNTER — Ambulatory Visit (INDEPENDENT_AMBULATORY_CARE_PROVIDER_SITE_OTHER): Payer: Medicare Other | Admitting: Gynecology

## 2012-12-25 VITALS — BP 136/70 | Ht 64.0 in | Wt 183.0 lb

## 2012-12-25 DIAGNOSIS — N318 Other neuromuscular dysfunction of bladder: Secondary | ICD-10-CM | POA: Diagnosis not present

## 2012-12-25 DIAGNOSIS — N952 Postmenopausal atrophic vaginitis: Secondary | ICD-10-CM | POA: Diagnosis not present

## 2012-12-25 DIAGNOSIS — K625 Hemorrhage of anus and rectum: Secondary | ICD-10-CM | POA: Diagnosis not present

## 2012-12-25 DIAGNOSIS — N3281 Overactive bladder: Secondary | ICD-10-CM

## 2012-12-25 DIAGNOSIS — K644 Residual hemorrhoidal skin tags: Secondary | ICD-10-CM

## 2012-12-25 DIAGNOSIS — D251 Intramural leiomyoma of uterus: Secondary | ICD-10-CM

## 2012-12-25 NOTE — Progress Notes (Signed)
Samantha [name] 10/20/1940 [id number]        72 y.o.  G2P2002 with several issues noted below.  Past medical history,surgical history, medications, allergies, family history and social history were all reviewed and documented in the EPIC chart.  ROS:  Performed and pertinent positives and negatives are included in the history, assessment and plan .  Exam: Biomedical scientist Filed Vitals:   12/25/12 1121  BP: 136/70  Height: 5\' 4"  (1.626 m)  Weight: 183 lb (83.008 kg)   General appearance  Normal Skin grossly normal Head/Neck normal with no cervical or supraclavicular adenopathy thyroid normal Lungs  clear Cardiac RR, without RMG Abdominal  soft, nontender, without masses, organomegaly or hernia Breasts  examined lying and sitting without masses, retractions, discharge or axillary adenopathy. Pelvic  Ext/BUS/vagina  normal generalized atrophic changes  Cervix  normal and atrophic  Uterus  Small, grossly normal. Exam limited by abdominal girth   Adnexa  Without masses or tenderness    Anus and perineum  normal   Rectovaginal  normal sphincter tone without palpated masses or tenderness.    Assessment/Plan:  72 y.o. [id number] female for annual exam.   1. Atrophic vaginitis. Patient asymptomatic without significant dryness itching or other symptoms. We'll continue to monitor. 2. Detrusor instability. Patient having urgency symptoms, occasional incontinence if she cannot make it to the bathroom. Worse after her coffee. Check urinalysis today. Behavior modification reviewed to include decrease caffeine, carbonated beverages, timed emptying of her bladder. Possible medication for OAB discussed. Side effect profile reviewed. At this point she's going to try the behavior modification. 3. 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 Exam has been normal we'll continue to monitor with pelvic exams. She is asymptomatic no history of bleeding or other issues. 4. Rectal bleeding. Patient had several episodes of bright red  rectal bleeding. She does have an old external hemorrhoid but no active bleeding or fissures noted. Recommend she followup with Dr. Loreta Ave who is her gastroenterologist and she agrees to call and arrange this. She's also having some nausea and upper abdominal complaints and she'll followup with her in reference to this also. She did have a colonoscopy in 2012. 5. Mammography May 2014. Continued annual mammography. SBE monthly reviewed. 6. Pap smear 2012. No Pap smear done today. No history of significant abnormal Pap smears. Plan stop screening per current screening guidelines and she is comfortable with this. 7. DEXA June 2014. Patient never heard from Dr.Tissovic's office in followup. I have asked her to call his office and followup with in reference to this. 8. Health maintenance. No blood work done as this is all done through her other physician's offices. Followup one year, sooner as needed.  Note: This document was prepared with digital dictation and possible smart phrase technology. Any transcriptional errors that result from this process are unintentional.   Dara Lords MD, 11:48 AM 12/25/2012

## 2012-12-25 NOTE — Patient Instructions (Signed)
Followup with Dr. Loreta Ave in reference to your rectal bleeding and nausea symptoms. Followup about your bone density report. Followup with me in one year for reexamination.

## 2012-12-26 LAB — URINALYSIS W MICROSCOPIC + REFLEX CULTURE
Bacteria, UA: NONE SEEN
Bilirubin Urine: NEGATIVE
Casts: NONE SEEN
Crystals: NONE SEEN
Glucose, UA: NEGATIVE mg/dL
Ketones, ur: NEGATIVE mg/dL
Leukocytes, UA: NEGATIVE
Nitrite: NEGATIVE
Protein, ur: NEGATIVE mg/dL
Specific Gravity, Urine: <1.005 — ABNORMAL LOW (ref 1.005–1.030)
Squamous Epithelial / LPF: NONE SEEN
Urobilinogen, UA: 0.2 mg/dL (ref 0.0–1.0)
pH: 7 (ref 5.0–8.0)

## 2012-12-30 DIAGNOSIS — Z85048 Personal history of other malignant neoplasm of rectum, rectosigmoid junction, and anus: Secondary | ICD-10-CM | POA: Diagnosis not present

## 2012-12-30 DIAGNOSIS — R198 Other specified symptoms and signs involving the digestive system and abdomen: Secondary | ICD-10-CM | POA: Diagnosis not present

## 2013-01-21 ENCOUNTER — Other Ambulatory Visit: Payer: Self-pay

## 2013-02-04 ENCOUNTER — Other Ambulatory Visit: Payer: Self-pay | Admitting: *Deleted

## 2013-02-04 MED ORDER — CARVEDILOL 25 MG PO TABS: 25.0000 mg | ORAL_TABLET | Freq: Two times a day (BID) | ORAL | Status: DC

## 2013-02-04 NOTE — Telephone Encounter (Signed)
PHARMACY NEEDED 90 TABLETS. Fax Received. Refill Completed. Joselle Deeds Chowoe (R.M.A)

## 2013-02-07 ENCOUNTER — Other Ambulatory Visit: Payer: Self-pay

## 2013-02-07 MED ORDER — ROPINIROLE HCL 0.5 MG PO TABS: 0.5000 mg | ORAL_TABLET | Freq: Every day | ORAL | Status: DC

## 2013-02-12 DIAGNOSIS — Z6830 Body mass index (BMI) 30.0-30.9, adult: Secondary | ICD-10-CM | POA: Diagnosis not present

## 2013-02-12 DIAGNOSIS — D649 Anemia, unspecified: Secondary | ICD-10-CM | POA: Diagnosis not present

## 2013-02-12 DIAGNOSIS — R5381 Other malaise: Secondary | ICD-10-CM | POA: Diagnosis not present

## 2013-02-23 DIAGNOSIS — R310 Gross hematuria: Secondary | ICD-10-CM | POA: Diagnosis not present

## 2013-02-23 DIAGNOSIS — N3941 Urge incontinence: Secondary | ICD-10-CM | POA: Diagnosis not present

## 2013-02-23 DIAGNOSIS — N3020 Other chronic cystitis without hematuria: Secondary | ICD-10-CM | POA: Diagnosis not present

## 2013-02-24 DIAGNOSIS — R319 Hematuria, unspecified: Secondary | ICD-10-CM | POA: Diagnosis not present

## 2013-02-24 DIAGNOSIS — R310 Gross hematuria: Secondary | ICD-10-CM | POA: Diagnosis not present

## 2013-02-24 DIAGNOSIS — N3020 Other chronic cystitis without hematuria: Secondary | ICD-10-CM | POA: Diagnosis not present

## 2013-03-05 ENCOUNTER — Other Ambulatory Visit: Payer: Self-pay | Admitting: Cardiovascular Disease

## 2013-03-09 ENCOUNTER — Encounter: Payer: Self-pay | Admitting: Cardiovascular Disease

## 2013-03-09 ENCOUNTER — Ambulatory Visit (INDEPENDENT_AMBULATORY_CARE_PROVIDER_SITE_OTHER): Payer: Medicare Other | Admitting: Cardiovascular Disease

## 2013-03-09 ENCOUNTER — Other Ambulatory Visit: Payer: Self-pay | Admitting: Cardiovascular Disease

## 2013-03-09 VITALS — BP 120/68 | HR 81 | Ht 64.0 in | Wt 183.4 lb

## 2013-03-09 DIAGNOSIS — I5022 Chronic systolic (congestive) heart failure: Secondary | ICD-10-CM

## 2013-03-09 DIAGNOSIS — R0602 Shortness of breath: Secondary | ICD-10-CM | POA: Diagnosis not present

## 2013-03-09 DIAGNOSIS — I10 Essential (primary) hypertension: Secondary | ICD-10-CM | POA: Diagnosis not present

## 2013-03-09 DIAGNOSIS — I509 Heart failure, unspecified: Secondary | ICD-10-CM | POA: Diagnosis not present

## 2013-03-09 NOTE — Progress Notes (Signed)
Samantha [name] Kitchen    Precious Gilding Date of Birth  02-20-1941       [facility]    [place] [address]  [address] [place], [place]  [address]   [place], [place]  [address] [phone]     [phone]   Fax  [phone]    Fax [phone]  Problem List: 1. Congestive heart failure- original EF 30-35%, now EF = 45-50% 2. History colon cancer-status post radiation and chemotherapy-April 2007 3. Smooth and normal coronary arteries by heart catheterization in 2000  History of Present Illness:  She has done well from a cardiac stand point.  She has had some dizziness and found her BP to be low yesterday.  She has occasional palpitations and frequently finds that her heart rate is about 100 with minimal exertion.  September 18, 2012:  Samantha [name] is doing well.  Still has some dyspnea - especially when bending over or stooping over.    No chest pain.  She eats an ice cream sundae every day, sometimes twice a day.   She still eats fast food almost all of the time.  She had an EF of 35% several years ago.  Her EF has improved to 45-50% with medical therapy.   Sept. 22, 2014:  Samantha [name] is doing well from a cardiac standpoint.  She has been found to have a lung nodule.   She has occasional episodes of dyspnea - especially with exertion. She is still eating salty foods - she salts everything that she eats.    Current Outpatient Prescriptions on File Prior to Visit  Medication Sig Dispense Refill  . carvedilol (COREG) 25 MG tablet Take 1 tablet (25 mg total) by mouth 2 (two) times daily with a meal.  180 tablet  3  . Cetirizine HCl (ZYRTEC PO) Take 2 tablets by mouth daily.       . citalopram (CELEXA) 40 MG tablet Take 40 mg by mouth as needed. 1 tab po qd/ pt not taking regularly       . famotidine (PEPCID) 10 MG tablet Take 10 mg by mouth at bedtime as needed.       Samantha [name] Kitchen losartan (COZAAR) 50 MG tablet TAKE 1 TABLET (50 MG TOTAL) BY MOUTH DAILY.  90 tablet  0  .  rOPINIRole (REQUIP) 0.5 MG tablet Take 1 tablet (0.5 mg total) by mouth at bedtime.  90 tablet  3  . valACYclovir (VALTREX) 500 MG tablet Take by mouth as needed.       . vitamin B-12 (CYANOCOBALAMIN) 1000 MCG tablet Take 1,000 mcg by mouth daily.      . zaleplon (SONATA) 10 MG capsule Take 1 capsule (10 mg total) by mouth as needed.  15 capsule  5   No current facility-administered medications on file prior to visit.    Allergies  Allergen Reactions  . Codeine     Upset stomach    . Iohexol      Desc: Pt. states she broke out in hives 30 yrs ago w/ a CT Brain scan.  She has since had a heart cath and this CT today (08/10/05) w/o premeds and did well. No evident reaction.  Thanks.     Past Medical History  Diagnosis Date  . Conjunctivitis   . Febrile neutropenia   . Anemia   . Hypokalemia   . Mucositis   . LBBB (left bundle branch block)   . Nonischemic cardiomyopathy     EF  30 to 35%  . S/P cardiac cath 2000    Normal coronaries  . CHD (coronary heart disease)   . Renal artery aneurysm   . CHF (congestive heart failure)   . Cancer of colon 08/2005    STAGE 1 CHEMO AND RADIATION  . Sleep apnea     Past Surgical History  Procedure Laterality Date  . Appendectomy    . Cesarean section    . Cholecystectomy    . Tonsillectomy    . Port-a-cath removal    . US echocardiography  3/12    EF 30 to 35%  . Hemorroidectomy    . Tubal ligation      History  Smoking status  . Never Smoker   Smokeless tobacco  . Never Used    History  Alcohol Use No    Family History  Problem Relation Age of Onset  . Diabetes Sister   . Cancer Brother     PROSTATE    Reviw of Systems:  Reviewed in the HPI.  All other systems are negative.  Physical Exam: Blood pressure 120/68, pulse 81, height 5\' 4"  (1.626 m), weight 183 lb 6.4 oz (83.19 kg), SpO2 95.00%. General: Well developed, well nourished, in no acute distress.  Head: Normocephalic, atraumatic, sclera non-icteric,  mucus membranes are moist,   Neck: Supple. Carotids are 2 + without bruits. No JVD  Lungs: Clear bilaterally to auscultation.  Heart: regular rate.  normal  S1 S2. No murmurs, gallops or rubs.  Abdomen: Soft, non-tender, non-distended with normal bowel sounds. No hepatomegaly. No rebound/guarding. No masses.  Msk:  Strength and tone are normal  Extremities: No clubbing or cyanosis. No edema.  Distal pedal pulses are 2+ and equal bilaterally.  Neuro: Alert and oriented X 3. Moves all extremities spontaneously.  Psych:  Responds to questions appropriately with a normal affect.  ECG: Sept. 22, 2014:  NSR at 81, NS IVCD  Assessment / Plan:

## 2013-03-09 NOTE — Patient Instructions (Addendum)
REDUCE HIGH SODIUM FOODS LIKE CANNED SOUP, GRAVY, SAUCES, READY PREPARED FOODS LIKE FROZEN FOODS; LEAN CUISINE, LASAGNA. BACON, SAUSAGE, LUNCH MEAT, FAST FOODS, HOT DOGS, CHIPS, PIZZA, CHINESE FOOD, SOY SAUCE, STORE BOUGHT FRIED CHICKEN= [place] FRIED CHICKEN/ BOJANGLES.   Your physician recommends that you return for a FASTING lipid profile: tomorrow lipid liver bmet bnp   Your physician wants you to follow-up in: 6 months  You will receive a reminder letter in the mail two months in advance. If you don't receive a letter, please call our office to schedule the follow-up appointment.  Your physician recommends that you continue on your current medications as directed. Please refer to the Current Medication list given to you today.

## 2013-03-09 NOTE — Assessment & Plan Note (Signed)
Nadie continues to have problems with her chronic systolic congestive heart failure. She makes no effort to avoid salt and in fact admits to eating salt on everything that she eats.  We will give her some information on a low-salt diet. We'll check a B. natruretic peptide, lipid profile, hepatic profile, and basic met profile tomorrow morning. She has eaten lunch today.  Her blood pressure is fairly normal. We could try her on a diuretic but this point her neck veins seem to be normal.

## 2013-03-10 ENCOUNTER — Other Ambulatory Visit (INDEPENDENT_AMBULATORY_CARE_PROVIDER_SITE_OTHER): Payer: Medicare Other

## 2013-03-10 DIAGNOSIS — R0602 Shortness of breath: Secondary | ICD-10-CM

## 2013-03-10 DIAGNOSIS — I509 Heart failure, unspecified: Secondary | ICD-10-CM

## 2013-03-10 DIAGNOSIS — I10 Essential (primary) hypertension: Secondary | ICD-10-CM

## 2013-03-10 LAB — LIPID PANEL
Cholesterol: 185 mg/dL (ref 0–200)
HDL: 57.3 mg/dL (ref >39.00)
LDL Cholesterol: 110 mg/dL — ABNORMAL HIGH (ref 0–99)
Total CHOL/HDL Ratio: 3
Triglycerides: 91 mg/dL (ref 0.0–149.0)
VLDL: 18.2 mg/dL (ref 0.0–40.0)

## 2013-03-10 LAB — BASIC METABOLIC PANEL
BUN: 20 mg/dL (ref 6–23)
CO2: 27 mEq/L (ref 19–32)
Calcium: 9.3 mg/dL (ref 8.4–10.5)
Chloride: 107 mEq/L (ref 96–112)
Creatinine, Ser: 0.9 mg/dL (ref 0.4–1.2)
GFR: 66.35 mL/min (ref >60.00)
Glucose, Bld: 97 mg/dL (ref 70–99)
Potassium: 4.3 mEq/L (ref 3.5–5.1)
Sodium: 140 mEq/L (ref 135–145)

## 2013-03-10 LAB — BRAIN NATRIURETIC PEPTIDE: Pro B Natriuretic peptide (BNP): 27 pg/mL (ref 0.0–100.0)

## 2013-03-10 LAB — HEPATIC FUNCTION PANEL
ALT: 20 U/L (ref 0–35)
AST: 24 U/L (ref 0–37)
Albumin: 4.1 g/dL (ref 3.5–5.2)
Alkaline Phosphatase: 54 U/L (ref 39–117)
Bilirubin, Direct: 0 mg/dL (ref 0.0–0.3)
Total Bilirubin: 0.6 mg/dL (ref 0.3–1.2)
Total Protein: 7 g/dL (ref 6.0–8.3)

## 2013-04-23 ENCOUNTER — Other Ambulatory Visit: Payer: Self-pay

## 2013-04-30 DIAGNOSIS — Z23 Encounter for immunization: Secondary | ICD-10-CM | POA: Diagnosis not present

## 2013-04-30 DIAGNOSIS — G2581 Restless legs syndrome: Secondary | ICD-10-CM | POA: Diagnosis not present

## 2013-04-30 DIAGNOSIS — M899 Disorder of bone, unspecified: Secondary | ICD-10-CM | POA: Diagnosis not present

## 2013-04-30 DIAGNOSIS — I059 Rheumatic mitral valve disease, unspecified: Secondary | ICD-10-CM | POA: Diagnosis not present

## 2013-04-30 DIAGNOSIS — D649 Anemia, unspecified: Secondary | ICD-10-CM | POA: Diagnosis not present

## 2013-04-30 DIAGNOSIS — M1990 Unspecified osteoarthritis, unspecified site: Secondary | ICD-10-CM | POA: Diagnosis not present

## 2013-04-30 DIAGNOSIS — Z79899 Other long term (current) drug therapy: Secondary | ICD-10-CM | POA: Diagnosis not present

## 2013-04-30 DIAGNOSIS — M353 Polymyalgia rheumatica: Secondary | ICD-10-CM | POA: Diagnosis not present

## 2013-04-30 DIAGNOSIS — R5381 Other malaise: Secondary | ICD-10-CM | POA: Diagnosis not present

## 2013-04-30 DIAGNOSIS — E559 Vitamin D deficiency, unspecified: Secondary | ICD-10-CM | POA: Diagnosis not present

## 2013-05-28 DIAGNOSIS — N3020 Other chronic cystitis without hematuria: Secondary | ICD-10-CM | POA: Diagnosis not present

## 2013-05-28 DIAGNOSIS — N3941 Urge incontinence: Secondary | ICD-10-CM | POA: Diagnosis not present

## 2013-06-16 ENCOUNTER — Emergency Department (INDEPENDENT_AMBULATORY_CARE_PROVIDER_SITE_OTHER)
Admission: EM | Admit: 2013-06-16 | Discharge: 2013-06-16 | Disposition: A | Discharge status: Home / Self Care | Payer: Medicare Other | Source: Home / Self Care | Attending: Family Medicine | Admitting: Family Medicine

## 2013-06-16 ENCOUNTER — Encounter (HOSPITAL_COMMUNITY): Payer: Self-pay | Admitting: Emergency Medicine

## 2013-06-16 DIAGNOSIS — J069 Acute upper respiratory infection, unspecified: Secondary | ICD-10-CM

## 2013-06-16 MED ORDER — IPRATROPIUM BROMIDE 0.06 % NA SOLN: 2.0000 | Freq: Four times a day (QID) | NASAL | Status: DC

## 2013-06-16 MED ORDER — AMOXICILLIN 500 MG PO CAPS: 500.0000 mg | ORAL_CAPSULE | Freq: Three times a day (TID) | ORAL | Status: DC

## 2013-06-16 NOTE — ED Provider Notes (Signed)
Samantha [name] is a 72 y.o. female who presents to Urgent Care today for nasal congestion ear pain cough present for the last one week. Patient denies visual breathing nausea vomiting or diarrhea. He has tried some over-the-counter medications which have helped a bit. She feels well otherwise.   Past Medical History  Diagnosis Date  . Conjunctivitis   . Febrile neutropenia   . Anemia   . Hypokalemia   . Mucositis   . LBBB (left bundle branch block)   . Nonischemic cardiomyopathy     EF 30 to 35%  . S/P cardiac cath 2000    Normal coronaries  . CHD (coronary heart disease)   . Renal artery aneurysm   . CHF (congestive heart failure)   . Cancer of colon 08/2005    STAGE 1 CHEMO AND RADIATION  . Sleep apnea    History  Substance Use Topics  . Smoking status: Never Smoker   . Smokeless tobacco: Never Used  . Alcohol Use: No   ROS as above Medications reviewed. No current facility-administered medications for this encounter.   Current Outpatient Prescriptions  Medication Sig Dispense Refill  . carvedilol (COREG) 25 MG tablet Take 1 tablet (25 mg total) by mouth 2 (two) times daily with a meal.  180 tablet  3  . Cetirizine HCl (ZYRTEC PO) Take 2 tablets by mouth daily.       . citalopram (CELEXA) 40 MG tablet Take 40 mg by mouth as needed. 1 tab po qd/ pt not taking regularly       . famotidine (PEPCID) 10 MG tablet Take 10 mg by mouth at bedtime as needed.       . IRON PO Take by mouth daily.      Marland Kitchen losartan (COZAAR) 50 MG tablet TAKE 1 TABLET (50 MG TOTAL) BY MOUTH DAILY.  90 tablet  3  . Methylcellulose (MUROCEL OP) Apply to eye.      Marland Kitchen rOPINIRole (REQUIP) 0.5 MG tablet Take 1 tablet (0.5 mg total) by mouth at bedtime.  90 tablet  3  . trimethoprim (TRIMPEX) 100 MG tablet 100 mg daily.      . valACYclovir (VALTREX) 500 MG tablet Take by mouth as needed.       . vitamin B-12 (CYANOCOBALAMIN) 1000 MCG tablet Take 1,000 mcg by mouth daily.      . zaleplon (SONATA) 10 MG  capsule Take 1 capsule (10 mg total) by mouth as needed.  15 capsule  5    Exam:  BP 143/92  Pulse 69  Temp(Src) 98.3 F (36.8 C) (Oral)  Resp 18  SpO2 96% Gen: Well NAD HEENT: EOMI,  MMM tympanic membranes are retracted bilaterally. Posterior pharynx is mildly erythematous. Lungs: Normal work of breathing. Clear breath sounds bilaterally. No wheezing noted. Heart: RRR no MRG Abd: NABS, Soft. NT, ND Exts: Non edematous BL  LE, warm and well perfused.    Assessment and Plan: 72 y.o. female with viral URI. Plan for symptomatic management with Tylenol, and Atrovent nasal spray. If not improving recommend starting amoxicillin. Followup with primary care provider. Discussed warning signs or symptoms. Please see discharge instructions. Patient expresses understanding.      Rodolph Bong, MD 06/16/13 [phone]

## 2013-06-16 NOTE — ED Notes (Signed)
Concern for nasal congestion developing into bronchitis ; Anxious

## 2013-06-26 ENCOUNTER — Other Ambulatory Visit: Payer: Self-pay

## 2013-06-26 MED ORDER — ZALEPLON 10 MG PO CAPS: 10.0000 mg | ORAL_CAPSULE | ORAL | Status: DC | PRN

## 2013-07-10 DIAGNOSIS — D485 Neoplasm of uncertain behavior of skin: Secondary | ICD-10-CM | POA: Diagnosis not present

## 2013-07-10 DIAGNOSIS — D235 Other benign neoplasm of skin of trunk: Secondary | ICD-10-CM | POA: Diagnosis not present

## 2013-07-10 DIAGNOSIS — L819 Disorder of pigmentation, unspecified: Secondary | ICD-10-CM | POA: Diagnosis not present

## 2013-08-03 DIAGNOSIS — M353 Polymyalgia rheumatica: Secondary | ICD-10-CM | POA: Diagnosis not present

## 2013-08-03 DIAGNOSIS — M949 Disorder of cartilage, unspecified: Secondary | ICD-10-CM | POA: Diagnosis not present

## 2013-08-03 DIAGNOSIS — M899 Disorder of bone, unspecified: Secondary | ICD-10-CM | POA: Diagnosis not present

## 2013-08-03 DIAGNOSIS — E559 Vitamin D deficiency, unspecified: Secondary | ICD-10-CM | POA: Diagnosis not present

## 2013-08-03 DIAGNOSIS — C210 Malignant neoplasm of anus, unspecified: Secondary | ICD-10-CM | POA: Diagnosis not present

## 2013-08-03 DIAGNOSIS — G2581 Restless legs syndrome: Secondary | ICD-10-CM | POA: Diagnosis not present

## 2013-08-03 DIAGNOSIS — D649 Anemia, unspecified: Secondary | ICD-10-CM | POA: Diagnosis not present

## 2013-08-03 DIAGNOSIS — I059 Rheumatic mitral valve disease, unspecified: Secondary | ICD-10-CM | POA: Diagnosis not present

## 2013-08-03 DIAGNOSIS — D259 Leiomyoma of uterus, unspecified: Secondary | ICD-10-CM | POA: Diagnosis not present

## 2013-10-05 ENCOUNTER — Encounter: Payer: Self-pay | Admitting: Cardiovascular Disease

## 2013-10-05 ENCOUNTER — Ambulatory Visit (INDEPENDENT_AMBULATORY_CARE_PROVIDER_SITE_OTHER): Payer: Medicare Other | Admitting: Cardiovascular Disease

## 2013-10-05 VITALS — BP 104/70 | HR 78 | Ht 64.0 in | Wt 184.4 lb

## 2013-10-05 DIAGNOSIS — I509 Heart failure, unspecified: Secondary | ICD-10-CM

## 2013-10-05 DIAGNOSIS — I447 Left bundle-branch block, unspecified: Secondary | ICD-10-CM | POA: Diagnosis not present

## 2013-10-05 DIAGNOSIS — I5022 Chronic systolic (congestive) heart failure: Secondary | ICD-10-CM

## 2013-10-05 NOTE — Assessment & Plan Note (Signed)
Stable

## 2013-10-05 NOTE — Patient Instructions (Signed)
Your physician recommends that you continue on your current medications as directed. Please refer to the Current Medication list given to you today.  Your physician wants you to follow-up in: 6 months with Dr. Nahser.  You will receive a reminder letter in the mail two months in advance. If you don't receive a letter, please call our office to schedule the follow-up appointment.  

## 2013-10-05 NOTE — Assessment & Plan Note (Signed)
She is doing well from a cardiac standpoint.   She has  some shortness but is able to do all of her normal duties without any significant problems. Continue with same medications. I'll see her again in 6 months for followup visit.   i''ve advised her to limit her salt.

## 2013-10-05 NOTE — Progress Notes (Signed)
Marland Kitchen    Bonnell Public Date of Birth  07-11-1940       [facility]    [employer] [address], [place], [place]  [address]   [place], [place]  [address] [phone]     [phone]   Fax  [phone]    Fax [phone]  Problem List: 1. Congestive heart failure- original EF 30-35%, now EF = 45-50% 2. History colon cancer-status post radiation and chemotherapy-April 2007 3. Smooth and normal coronary arteries by heart catheterization in 2000  History of Present Illness:  She has done well from a cardiac stand point.  She has had some dizziness and found her BP to be low yesterday.  She has occasional palpitations and frequently finds that her heart rate is about 100 with minimal exertion.  September 18, 2012:  Ginna is doing well.  Still has some dyspnea - especially when bending over or stooping over.    No chest pain.  She eats an ice cream sundae every day, sometimes twice a day.   She still eats fast food almost all of the time.  She had an EF of 35% several years ago.  Her EF has improved to 45-50% with medical therapy.   Sept. 22, 2014:  Lanaya is doing well from a cardiac standpoint.  She has been found to have a lung nodule.   She has occasional episodes of dyspnea - especially with exertion. She is still eating salty foods - she salts everything that she eats.    October 05, 2013:  Doing well from a cardiac standpoint.  No CP,  Does have some dyspnea.    Has been out working out in the yard without problems.   Current Outpatient Prescriptions on File Prior to Visit  Medication Sig Dispense Refill  . carvedilol (COREG) 25 MG tablet Take 1 tablet (25 mg total) by mouth 2 (two) times daily with a meal.  180 tablet  3  . Cetirizine HCl (ZYRTEC PO) Take 2 tablets by mouth daily.       . citalopram (CELEXA) 40 MG tablet Take 40 mg by mouth as needed. 1 tab po qd/ pt not taking regularly       . ipratropium (ATROVENT) 0.06 %  nasal spray Place 2 sprays into both nostrils 4 (four) times daily.  15 mL  1  . IRON PO Take by mouth daily.      Marland Kitchen losartan (COZAAR) 50 MG tablet TAKE 1 TABLET (50 MG TOTAL) BY MOUTH DAILY.  90 tablet  3  . rOPINIRole (REQUIP) 0.5 MG tablet Take 1 tablet (0.5 mg total) by mouth at bedtime.  90 tablet  3  . valACYclovir (VALTREX) 500 MG tablet Take by mouth as needed.       . zaleplon (SONATA) 10 MG capsule Take 1 capsule (10 mg total) by mouth as needed.  15 capsule  5   No current facility-administered medications on file prior to visit.    Allergies  Allergen Reactions  . Codeine     Upset stomach    . Iohexol      Desc: Pt. states she broke out in hives 30 yrs ago w/ a CT Brain scan.  She has since had a heart cath and this CT today (08/10/05) w/o premeds and did well. No evident reaction.  Thanks.     Past Medical History  Diagnosis Date  . Conjunctivitis   . Febrile neutropenia   .  Anemia   . Hypokalemia   . Mucositis   . LBBB (left bundle branch block)   . Nonischemic cardiomyopathy     EF 30 to 35%  . S/P cardiac cath 2000    Normal coronaries  . CHD (coronary heart disease)   . Renal artery aneurysm   . CHF (congestive heart failure)   . Cancer of colon 08/2005    STAGE 1 CHEMO AND RADIATION  . Sleep apnea     Past Surgical History  Procedure Laterality Date  . Appendectomy    . Cesarean section    . Cholecystectomy    . Tonsillectomy    . Port-a-cath removal    . US echocardiography  3/12    EF 30 to 35%  . Hemorroidectomy    . Tubal ligation      History  Smoking status  . Never Smoker   Smokeless tobacco  . Never Used    History  Alcohol Use No    Family History  Problem Relation Age of Onset  . Diabetes Sister   . Cancer Brother     PROSTATE    Reviw of Systems:  Reviewed in the HPI.  All other systems are negative.  Physical Exam: Blood pressure 104/70, pulse 78, height 5\' 4"  (1.626 m), weight 184 lb 6.4 oz (83.643  kg). General: Well developed, well nourished, in no acute distress.  Head: Normocephalic, atraumatic, sclera non-icteric, mucus membranes are moist,   Neck: Supple. Carotids are 2 + without bruits. No JVD  Lungs: Clear bilaterally to auscultation.  Heart: regular rate.  normal  S1 S2. No murmurs, gallops or rubs.  Abdomen: Soft, non-tender, non-distended with normal bowel sounds. No hepatomegaly. No rebound/guarding. No masses.  Msk:  Strength and tone are normal  Extremities: No clubbing or cyanosis. No edema.  Distal pedal pulses are 2+ and equal bilaterally.  Neuro: Alert and oriented X 3. Moves all extremities spontaneously.  Psych:  Responds to questions appropriately with a normal affect.  ECG: Sept. 22, 2014:  NSR at 81, NS IVCD  Assessment / Plan:

## 2013-10-12 DIAGNOSIS — M25559 Pain in unspecified hip: Secondary | ICD-10-CM | POA: Diagnosis not present

## 2013-10-12 DIAGNOSIS — M545 Low back pain, unspecified: Secondary | ICD-10-CM | POA: Diagnosis not present

## 2013-10-22 DIAGNOSIS — D649 Anemia, unspecified: Secondary | ICD-10-CM | POA: Diagnosis not present

## 2013-10-22 DIAGNOSIS — I059 Rheumatic mitral valve disease, unspecified: Secondary | ICD-10-CM | POA: Diagnosis not present

## 2013-10-22 DIAGNOSIS — R82998 Other abnormal findings in urine: Secondary | ICD-10-CM | POA: Diagnosis not present

## 2013-10-22 DIAGNOSIS — E559 Vitamin D deficiency, unspecified: Secondary | ICD-10-CM | POA: Diagnosis not present

## 2013-10-22 DIAGNOSIS — Z79899 Other long term (current) drug therapy: Secondary | ICD-10-CM | POA: Diagnosis not present

## 2013-10-29 DIAGNOSIS — Z79899 Other long term (current) drug therapy: Secondary | ICD-10-CM | POA: Diagnosis not present

## 2013-10-29 DIAGNOSIS — G4733 Obstructive sleep apnea (adult) (pediatric): Secondary | ICD-10-CM | POA: Diagnosis not present

## 2013-10-29 DIAGNOSIS — I722 Aneurysm of renal artery: Secondary | ICD-10-CM | POA: Diagnosis not present

## 2013-10-29 DIAGNOSIS — D259 Leiomyoma of uterus, unspecified: Secondary | ICD-10-CM | POA: Diagnosis not present

## 2013-10-29 DIAGNOSIS — G2581 Restless legs syndrome: Secondary | ICD-10-CM | POA: Diagnosis not present

## 2013-10-29 DIAGNOSIS — Z1331 Encounter for screening for depression: Secondary | ICD-10-CM | POA: Diagnosis not present

## 2013-10-29 DIAGNOSIS — M1990 Unspecified osteoarthritis, unspecified site: Secondary | ICD-10-CM | POA: Diagnosis not present

## 2013-10-29 DIAGNOSIS — Z0000 Encounter for general adult medical examination without abnormal findings: Secondary | ICD-10-CM | POA: Diagnosis not present

## 2013-10-29 DIAGNOSIS — M25559 Pain in unspecified hip: Secondary | ICD-10-CM | POA: Diagnosis not present

## 2013-10-29 DIAGNOSIS — D649 Anemia, unspecified: Secondary | ICD-10-CM | POA: Diagnosis not present

## 2013-10-30 DIAGNOSIS — Z1212 Encounter for screening for malignant neoplasm of rectum: Secondary | ICD-10-CM | POA: Diagnosis not present

## 2013-11-03 DIAGNOSIS — M949 Disorder of cartilage, unspecified: Secondary | ICD-10-CM | POA: Diagnosis not present

## 2013-11-03 DIAGNOSIS — M899 Disorder of bone, unspecified: Secondary | ICD-10-CM | POA: Diagnosis not present

## 2013-11-05 DIAGNOSIS — M533 Sacrococcygeal disorders, not elsewhere classified: Secondary | ICD-10-CM | POA: Diagnosis not present

## 2013-11-05 DIAGNOSIS — M545 Low back pain, unspecified: Secondary | ICD-10-CM | POA: Diagnosis not present

## 2013-11-05 DIAGNOSIS — M25559 Pain in unspecified hip: Secondary | ICD-10-CM | POA: Diagnosis not present

## 2013-11-13 DIAGNOSIS — M25559 Pain in unspecified hip: Secondary | ICD-10-CM | POA: Diagnosis not present

## 2013-11-13 DIAGNOSIS — M545 Low back pain, unspecified: Secondary | ICD-10-CM | POA: Diagnosis not present

## 2013-11-13 DIAGNOSIS — M533 Sacrococcygeal disorders, not elsewhere classified: Secondary | ICD-10-CM | POA: Diagnosis not present

## 2013-11-13 DIAGNOSIS — H1030 Unspecified acute conjunctivitis, unspecified eye: Secondary | ICD-10-CM | POA: Diagnosis not present

## 2013-11-24 ENCOUNTER — Ambulatory Visit (INDEPENDENT_AMBULATORY_CARE_PROVIDER_SITE_OTHER): Payer: Medicare Other | Admitting: Nurse Practitioner

## 2013-11-24 ENCOUNTER — Encounter: Payer: Self-pay | Admitting: Nurse Practitioner

## 2013-11-24 VITALS — BP 100/57 | HR 67 | Ht 65.0 in | Wt 186.0 lb

## 2013-11-24 DIAGNOSIS — M533 Sacrococcygeal disorders, not elsewhere classified: Secondary | ICD-10-CM | POA: Diagnosis not present

## 2013-11-24 DIAGNOSIS — M545 Low back pain, unspecified: Secondary | ICD-10-CM | POA: Diagnosis not present

## 2013-11-24 DIAGNOSIS — G250 Essential tremor: Secondary | ICD-10-CM

## 2013-11-24 DIAGNOSIS — G252 Other specified forms of tremor: Secondary | ICD-10-CM

## 2013-11-24 DIAGNOSIS — M25559 Pain in unspecified hip: Secondary | ICD-10-CM | POA: Diagnosis not present

## 2013-11-24 DIAGNOSIS — G2581 Restless legs syndrome: Secondary | ICD-10-CM

## 2013-11-24 NOTE — Patient Instructions (Signed)
Continue Requip at current dose Followup yearly, next visit with Dr. Brett Fairy

## 2013-11-24 NOTE — Progress Notes (Signed)
[facility]  PATIENT: Samantha [name] DOB: 01-26-1941   REASON FOR VISIT: Followup for restless leg syndrome and obstructive sleep apnea    HISTORY OF PRESENT ILLNESS: Samantha [name], 73 year old female returns for followup. She was last seen in this office in 11/24/2012. She has a history of sleep apnea which was diagnosed during PSG 08/15/2011. She currently uses a nasal pillow and humidity. She also has insomnia at baseline and uses Sonata when necessary. She has a history of restless leg syndrome and and is currently on Requip 0.5 daily which has been very beneficial. She claims she uses her CPAP every night, she brought her machine in today for download. ESS today is 9.      REVIEW OF SYSTEMS: Full 14 system review of systems performed and notable only for those listed, all others are neg:  Constitutional: Fatigue  Cardiovascular: Murmur  Ear/Nose/Throat: Hearing loss  Skin: N/A  Eyes: N/A  Respiratory: N/A  Gastroitestinal: Urinary frequency  Hematology/Lymphatic: N/A  Endocrine: N/A Musculoskeletal: Back pain Allergy/Immunology: Food allergies Neurological: N/A Psychiatric: N/A Sleep : Restless legs, sleep apnea   ALLERGIES: Allergies  Allergen Reactions  . Codeine     Upset stomach    . Iohexol      Desc: Pt. states she broke out in hives 30 yrs ago w/ a CT Brain scan.  She has since had a heart cath and this CT today (08/10/05) w/o premeds and did well. No evident reaction.  Thanks.     HOME MEDICATIONS: Outpatient Prescriptions Prior to Visit  Medication Sig Dispense Refill  . carvedilol (COREG) 25 MG tablet Take 1 tablet (25 mg total) by mouth 2 (two) times daily with a meal.  180 tablet  3  . Cetirizine HCl (ZYRTEC PO) Take 2 tablets by mouth daily.       . citalopram (CELEXA) 40 MG tablet Take 40 mg by mouth as needed. 1 tab po qd/ pt not taking regularly       . ipratropium (ATROVENT) 0.06 % nasal spray Place 2 sprays into both nostrils 4  (four) times daily.  15 mL  1  . IRON PO Take by mouth daily.      Marland Kitchen losartan (COZAAR) 50 MG tablet TAKE 1 TABLET (50 MG TOTAL) BY MOUTH DAILY.  90 tablet  3  . rOPINIRole (REQUIP) 0.5 MG tablet Take 1 tablet (0.5 mg total) by mouth at bedtime.  90 tablet  3  . valACYclovir (VALTREX) 500 MG tablet Take by mouth as needed.       . zaleplon (SONATA) 10 MG capsule Take 1 capsule (10 mg total) by mouth as needed.  15 capsule  5   No facility-administered medications prior to visit.    PAST MEDICAL HISTORY: Past Medical History  Diagnosis Date  . Conjunctivitis   . Febrile neutropenia   . Anemia   . Hypokalemia   . Mucositis   . LBBB (left bundle branch block)   . Nonischemic cardiomyopathy     EF 30 to 35%  . S/P cardiac cath 2000    Normal coronaries  . CHD (coronary heart disease)   . Renal artery aneurysm   . CHF (congestive heart failure)   . Cancer of colon 08/2005    STAGE 1 CHEMO AND RADIATION  . Sleep apnea     PAST SURGICAL HISTORY: Past Surgical History  Procedure Laterality Date  . Appendectomy    . Cesarean section    . Cholecystectomy    .  Tonsillectomy    . Port-a-cath removal    . US echocardiography  3/12    EF 30 to 35%  . Hemorroidectomy    . Tubal ligation      FAMILY HISTORY: Family History  Problem Relation Age of Onset  . Diabetes Sister   . Cancer Brother     PROSTATE    SOCIAL HISTORY: History   Social History  . Marital Status: Divorced    Spouse Name: N/A    Number of Children: 2  . Years of Education: 12   Occupational History  .    Marland Kitchen Retired    Social History Main Topics  . Smoking status: Never Smoker   . Smokeless tobacco: Never Used  . Alcohol Use: No  . Drug Use: No  . Sexual Activity: No   Other Topics Concern  . Not on file   Social History Narrative   Patient lives at home alone.    Patient has 2 children.    Patient is retired.    Patient has a high school education.    Patient is divorced.    Patient is  right handed.      PHYSICAL EXAM  Filed Vitals:   11/24/13 1339  BP: 100/57  Pulse: 67  Height: 5\' 5"  (1.651 m)  Weight: 186 lb (84.369 kg)   Body mass index is 30.95 kg/(m^2).  Generalized: Well developed, in no acute distress  Head: normocephalic and atraumatic,. Oropharynx benign   Neurological examination   Mentation: Alert oriented to time, place, history taking. Follows all commands speech and language fluent. ESS =9. International restless leg rating scale 20.   Cranial nerve II-XII: Pupils were equal round reactive to light extraocular movements were full, visual field were full on confrontational test. Facial sensation and strength were normal. hearing decreased on the right  Uvula tongue midline. head turning and shoulder shrug were normal and symmetric.Tongue protrusion into cheek strength was normal. Motor: normal bulk and tone, full strength in the BUE, BLE,  No focal weakness. Mild essential tremor bilaterally Sensory: normal and symmetric to light touch, pinprick, and  vibration  Coordination: finger-nose-finger, heel-to-shin bilaterally, no dysmetria Reflexes: Brachioradialis 2/2, biceps 2/2, triceps 2/2, patellar 2/2, Achilles 2/2, plantar responses were flexor bilaterally. Gait and Station: Rising up from seated position without assistance, normal stance,  moderate stride, good arm swing, smooth turning, able to perform tiptoe, and heel walking without difficulty. Tandem gait is steady. No assistive device  DIAGNOSTIC DATA (LABS, IMAGING, TESTING) - I reviewed patient records, labs, notes, testing and imaging myself where available.  Lab Results  Component Value Date   WBC 8.5 04/28/2012   HGB 12.6 04/28/2012   HCT 37.5 04/28/2012   MCV 91.9 04/28/2012   PLT 239 04/28/2012      Component Value Date/Time   NA 140 03/10/2013 1142   NA 141 10/01/2011 1302   K 4.3 03/10/2013 1142   K 4.3 10/01/2011 1302   CL 107 03/10/2013 1142   CL 100 10/01/2011 1302   CO2 27  03/10/2013 1142   CO2 30 10/01/2011 1302   GLUCOSE 97 03/10/2013 1142   GLUCOSE 93 10/01/2011 1302   BUN 20 03/10/2013 1142   BUN 18 10/01/2011 1302   CREATININE 0.9 03/10/2013 1142   CREATININE 0.7 10/01/2011 1302   CALCIUM 9.3 03/10/2013 1142   CALCIUM 8.8 10/01/2011 1302   PROT 7.0 03/10/2013 1142   PROT 7.2 10/01/2011 1302   ALBUMIN 4.1 03/10/2013 1142  AST 24 03/10/2013 1142   AST 22 10/01/2011 1302   ALT 20 03/10/2013 1142   ALT 23 10/01/2011 1302   ALKPHOS 54 03/10/2013 1142   ALKPHOS 69 10/01/2011 1302   BILITOT 0.6 03/10/2013 1142   BILITOT 0.40 10/01/2011 1302   GFRNONAA 68* 04/28/2012 2201   GFRAA 79* 04/28/2012 2201   Lab Results  Component Value Date   CHOL 185 03/10/2013   HDL 57.30 03/10/2013   LDLCALC 110* 03/10/2013   TRIG 91.0 03/10/2013   CHOLHDL 3 03/10/2013       ASSESSMENT AND PLAN  73 y.o. year old female  has a past medical history of obstructive sleep apnea diagnosed in February 2013 currently using nasal pillow. Download to be performed today. Restless leg syndrome controlled on Requip. Sonata when necessary for insomnia  Continue Requip at current dose Download to be done in the sleep lab Followup yearly, next visit with Dr. Brett Fairy Samantha [name], [facility], [facility], APRN  [facility] [address], [place], [place] [address] [phone]

## 2013-11-25 NOTE — Progress Notes (Signed)
I agree with the assessment and plan as directed by NP .The patient is known to me .   Aryan Sparks, MD  

## 2013-11-26 DIAGNOSIS — N3020 Other chronic cystitis without hematuria: Secondary | ICD-10-CM | POA: Diagnosis not present

## 2013-11-26 DIAGNOSIS — N3941 Urge incontinence: Secondary | ICD-10-CM | POA: Diagnosis not present

## 2013-12-01 DIAGNOSIS — M545 Low back pain, unspecified: Secondary | ICD-10-CM | POA: Diagnosis not present

## 2013-12-01 DIAGNOSIS — M25559 Pain in unspecified hip: Secondary | ICD-10-CM | POA: Diagnosis not present

## 2013-12-01 DIAGNOSIS — M533 Sacrococcygeal disorders, not elsewhere classified: Secondary | ICD-10-CM | POA: Diagnosis not present

## 2013-12-03 DIAGNOSIS — D485 Neoplasm of uncertain behavior of skin: Secondary | ICD-10-CM | POA: Diagnosis not present

## 2013-12-03 DIAGNOSIS — L259 Unspecified contact dermatitis, unspecified cause: Secondary | ICD-10-CM | POA: Diagnosis not present

## 2013-12-07 DIAGNOSIS — H2510 Age-related nuclear cataract, unspecified eye: Secondary | ICD-10-CM | POA: Diagnosis not present

## 2013-12-07 DIAGNOSIS — H18519 Endothelial corneal dystrophy, unspecified eye: Secondary | ICD-10-CM | POA: Diagnosis not present

## 2013-12-07 DIAGNOSIS — H5200 Hypermetropia, unspecified eye: Secondary | ICD-10-CM | POA: Diagnosis not present

## 2013-12-11 DIAGNOSIS — Z1231 Encounter for screening mammogram for malignant neoplasm of breast: Secondary | ICD-10-CM | POA: Diagnosis not present

## 2013-12-11 DIAGNOSIS — M545 Low back pain, unspecified: Secondary | ICD-10-CM | POA: Diagnosis not present

## 2013-12-11 DIAGNOSIS — M25559 Pain in unspecified hip: Secondary | ICD-10-CM | POA: Diagnosis not present

## 2013-12-11 DIAGNOSIS — M533 Sacrococcygeal disorders, not elsewhere classified: Secondary | ICD-10-CM | POA: Diagnosis not present

## 2013-12-14 DIAGNOSIS — M25559 Pain in unspecified hip: Secondary | ICD-10-CM | POA: Diagnosis not present

## 2013-12-15 ENCOUNTER — Encounter: Payer: Self-pay | Admitting: Gynecology

## 2013-12-15 DIAGNOSIS — K6289 Other specified diseases of anus and rectum: Secondary | ICD-10-CM | POA: Diagnosis not present

## 2013-12-15 DIAGNOSIS — K625 Hemorrhage of anus and rectum: Secondary | ICD-10-CM | POA: Diagnosis not present

## 2013-12-15 DIAGNOSIS — Z85048 Personal history of other malignant neoplasm of rectum, rectosigmoid junction, and anus: Secondary | ICD-10-CM | POA: Diagnosis not present

## 2014-01-05 DIAGNOSIS — K6289 Other specified diseases of anus and rectum: Secondary | ICD-10-CM | POA: Diagnosis not present

## 2014-01-05 DIAGNOSIS — Z85048 Personal history of other malignant neoplasm of rectum, rectosigmoid junction, and anus: Secondary | ICD-10-CM | POA: Diagnosis not present

## 2014-01-05 DIAGNOSIS — R198 Other specified symptoms and signs involving the digestive system and abdomen: Secondary | ICD-10-CM | POA: Diagnosis not present

## 2014-01-05 DIAGNOSIS — Z86010 Personal history of colonic polyps: Secondary | ICD-10-CM | POA: Diagnosis not present

## 2014-01-07 DIAGNOSIS — H18519 Endothelial corneal dystrophy, unspecified eye: Secondary | ICD-10-CM | POA: Diagnosis not present

## 2014-01-07 DIAGNOSIS — H2589 Other age-related cataract: Secondary | ICD-10-CM | POA: Diagnosis not present

## 2014-01-07 DIAGNOSIS — H2510 Age-related nuclear cataract, unspecified eye: Secondary | ICD-10-CM | POA: Diagnosis not present

## 2014-01-28 DIAGNOSIS — H2589 Other age-related cataract: Secondary | ICD-10-CM | POA: Diagnosis not present

## 2014-01-28 DIAGNOSIS — H2510 Age-related nuclear cataract, unspecified eye: Secondary | ICD-10-CM | POA: Diagnosis not present

## 2014-02-02 ENCOUNTER — Other Ambulatory Visit: Payer: Self-pay | Admitting: Neurology

## 2014-02-04 NOTE — Telephone Encounter (Signed)
Rx has been faxed.

## 2014-02-14 ENCOUNTER — Encounter (HOSPITAL_COMMUNITY): Payer: Self-pay | Admitting: Emergency Medicine

## 2014-02-14 ENCOUNTER — Emergency Department (HOSPITAL_COMMUNITY): Payer: Medicare Other

## 2014-02-14 ENCOUNTER — Emergency Department (HOSPITAL_COMMUNITY)
Admission: EM | Admit: 2014-02-14 | Discharge: 2014-02-14 | Disposition: A | Discharge status: Home / Self Care | Payer: Medicare Other | Attending: Emergency Medicine | Admitting: Emergency Medicine

## 2014-02-14 DIAGNOSIS — S335XXA Sprain of ligaments of lumbar spine, initial encounter: Secondary | ICD-10-CM | POA: Insufficient documentation

## 2014-02-14 DIAGNOSIS — Y9389 Activity, other specified: Secondary | ICD-10-CM | POA: Diagnosis not present

## 2014-02-14 DIAGNOSIS — Z9889 Other specified postprocedural states: Secondary | ICD-10-CM | POA: Diagnosis not present

## 2014-02-14 DIAGNOSIS — Z85038 Personal history of other malignant neoplasm of large intestine: Secondary | ICD-10-CM | POA: Insufficient documentation

## 2014-02-14 DIAGNOSIS — I722 Aneurysm of renal artery: Secondary | ICD-10-CM | POA: Diagnosis not present

## 2014-02-14 DIAGNOSIS — Z79899 Other long term (current) drug therapy: Secondary | ICD-10-CM | POA: Insufficient documentation

## 2014-02-14 DIAGNOSIS — X500XXA Overexertion from strenuous movement or load, initial encounter: Secondary | ICD-10-CM | POA: Insufficient documentation

## 2014-02-14 DIAGNOSIS — Z862 Personal history of diseases of the blood and blood-forming organs and certain disorders involving the immune mechanism: Secondary | ICD-10-CM | POA: Insufficient documentation

## 2014-02-14 DIAGNOSIS — Z8639 Personal history of other endocrine, nutritional and metabolic disease: Secondary | ICD-10-CM | POA: Insufficient documentation

## 2014-02-14 DIAGNOSIS — I509 Heart failure, unspecified: Secondary | ICD-10-CM | POA: Diagnosis not present

## 2014-02-14 DIAGNOSIS — Z8669 Personal history of other diseases of the nervous system and sense organs: Secondary | ICD-10-CM | POA: Diagnosis not present

## 2014-02-14 DIAGNOSIS — I2510 Atherosclerotic heart disease of native coronary artery without angina pectoris: Secondary | ICD-10-CM | POA: Insufficient documentation

## 2014-02-14 DIAGNOSIS — IMO0002 Reserved for concepts with insufficient information to code with codable children: Secondary | ICD-10-CM | POA: Insufficient documentation

## 2014-02-14 DIAGNOSIS — S39012A Strain of muscle, fascia and tendon of lower back, initial encounter: Secondary | ICD-10-CM

## 2014-02-14 DIAGNOSIS — Y9289 Other specified places as the place of occurrence of the external cause: Secondary | ICD-10-CM | POA: Insufficient documentation

## 2014-02-14 DIAGNOSIS — M549 Dorsalgia, unspecified: Secondary | ICD-10-CM | POA: Diagnosis not present

## 2014-02-14 MED ORDER — TRAMADOL HCL 50 MG PO TABS: 50.0000 mg | ORAL_TABLET | Freq: Four times a day (QID) | ORAL | Status: DC | PRN

## 2014-02-14 MED ORDER — HYDROCODONE-ACETAMINOPHEN 5-325 MG PO TABS
1.0000 | ORAL_TABLET | Freq: Once | ORAL | Status: AC
  Administered 2014-02-14: 1 via ORAL
  Filled 2014-02-14: qty 1

## 2014-02-14 MED ORDER — METHOCARBAMOL 500 MG PO TABS
500.0000 mg | ORAL_TABLET | Freq: Once | ORAL | Status: AC
  Administered 2014-02-14: 500 mg via ORAL
  Filled 2014-02-14: qty 1

## 2014-02-14 MED ORDER — METHOCARBAMOL 500 MG PO TABS: 500.0000 mg | ORAL_TABLET | Freq: Three times a day (TID) | ORAL | Status: DC | PRN

## 2014-02-14 NOTE — Discharge Instructions (Signed)
Take ultram and robaxin for pain. You can also take tylenol for additional pain relief. Try heating pads. Follow up with primary care doctor and your back doctor. Return if symptoms worsening.    Back Pain, Adult Low back pain is very common. About 1 in 5 people have back pain.The cause of low back pain is rarely dangerous. The pain often gets better over time.About half of people with a sudden onset of back pain feel better in just 2 weeks. About 8 in 10 people feel better by 6 weeks.  CAUSES Some common causes of back pain include:  Strain of the muscles or ligaments supporting the spine.  Wear and tear (degeneration) of the spinal discs.  Arthritis.  Direct injury to the back. DIAGNOSIS Most of the time, the direct cause of low back pain is not known.However, back pain can be treated effectively even when the exact cause of the pain is unknown.Answering your caregiver's questions about your overall health and symptoms is one of the most accurate ways to make sure the cause of your pain is not dangerous. If your caregiver needs more information, he or she may order lab work or imaging tests (X-rays or MRIs).However, even if imaging tests show changes in your back, this usually does not require surgery. HOME CARE INSTRUCTIONS For many people, back pain returns.Since low back pain is rarely dangerous, it is often a condition that people can learn to [facility] their own.   Remain active. It is stressful on the back to sit or stand in one place. Do not sit, drive, or stand in one place for more than 30 minutes at a time. Take short walks on level surfaces as soon as pain allows.Try to increase the length of time you walk each day.  Do not stay in bed.Resting more than 1 or 2 days can delay your recovery.  Do not avoid exercise or work.Your body is made to move.It is not dangerous to be active, even though your back may hurt.Your back will likely heal faster if you return to being  active before your pain is gone.  Pay attention to your body when you bend and lift. Many people have less discomfortwhen lifting if they bend their knees, keep the load close to their bodies,and avoid twisting. Often, the most comfortable positions are those that put less stress on your recovering back.  Find a comfortable position to sleep. Use a firm mattress and lie on your side with your knees slightly bent. If you lie on your back, put a pillow under your knees.  Only take over-the-counter or prescription medicines as directed by your caregiver. Over-the-counter medicines to reduce pain and inflammation are often the most helpful.Your caregiver may prescribe muscle relaxant drugs.These medicines help dull your pain so you can more quickly return to your normal activities and healthy exercise.  Put ice on the injured area.  Put ice in a plastic bag.  Place a towel between your skin and the bag.  Leave the ice on for 15-20 minutes, 03-04 times a day for the first 2 to 3 days. After that, ice and heat may be alternated to reduce pain and spasms.  Ask your caregiver about trying back exercises and gentle massage. This may be of some benefit.  Avoid feeling anxious or stressed.Stress increases muscle tension and can worsen back pain.It is important to recognize when you are anxious or stressed and learn ways to manage it.Exercise is a great option. SEEK MEDICAL CARE IF:  You have pain that is not relieved with rest or medicine.  You have pain that does not improve in 1 week.  You have new symptoms.  You are generally not feeling well. SEEK IMMEDIATE MEDICAL CARE IF:   You have pain that radiates from your back into your legs.  You develop new bowel or bladder control problems.  You have unusual weakness or numbness in your arms or legs.  You develop nausea or vomiting.  You develop abdominal pain.  You feel faint. Document Released: 06/04/2005 Document Revised:  12/04/2011 Document Reviewed: 10/06/2013 [facility] Patient Information 2015 [place], [place]. This information is not intended to replace advice given to you by your health care provider. Make sure you discuss any questions you have with your health care provider.

## 2014-02-14 NOTE — ED Notes (Signed)
Per pt sts she bent over in the yard earlier and when she stood back up felt lower/mid back on the left side pull. sts hx of same. sts she took extra strength tylenol.

## 2014-02-14 NOTE — ED Provider Notes (Signed)
CSN: [id number]     Arrival date & time 02/14/14  1757 History   First MD Initiated Contact with Patient 02/14/14 1834     Chief Complaint  Patient presents with  . Back Pain     (Consider location/radiation/quality/duration/timing/severity/associated sxs/prior Treatment) HPI Samantha [name] is a 73 y.o. female who presents emergency department complaining of back pain. Patient states she bent over to pick up a piece of grass approximately 3 hours ago, when she felt sharp pain in the left lower back. She states she was unable to stand up for several seconds. She states she called out for God, and finally she was able to stand up and slowly walk into the house. She states pain somewhat improved however returned when she sat down and was unable to get out of the chair due to pain. The pain is in the left lower back, does not radiate. No abdominal pain. No numbness or weakness in extremities. No loss of bowels or bladder function. She denies any fever. No urinary symptoms. She took a Tylenol which did not help. She reports history of similar back pain in the past, states she had to do physical therapy or orthopedics office. No other complaints.   Past Medical History  Diagnosis Date  . Conjunctivitis   . Febrile neutropenia   . Anemia   . Hypokalemia   . Mucositis   . LBBB (left bundle branch block)   . Nonischemic cardiomyopathy     EF 30 to 35%  . S/P cardiac cath 2000    Normal coronaries  . CHD (coronary heart disease)   . Renal artery aneurysm   . CHF (congestive heart failure)   . Cancer of colon 08/2005    STAGE 1 CHEMO AND RADIATION  . Sleep apnea    Past Surgical History  Procedure Laterality Date  . Appendectomy    . Cesarean section    . Cholecystectomy    . Tonsillectomy    . Port-a-cath removal    . US echocardiography  3/12    EF 30 to 35%  . Hemorroidectomy    . Tubal ligation     Family History  Problem Relation Age of Onset  . Diabetes Sister   . Cancer  Brother     PROSTATE   History  Substance Use Topics  . Smoking status: Never Smoker   . Smokeless tobacco: Never Used  . Alcohol Use: No   OB History   Grav Para Term Preterm Abortions TAB SAB Ect Mult Living   2 2 2       2      Review of Systems  Constitutional: Negative for fever and chills.  Respiratory: Negative for cough, chest tightness and shortness of breath.   Cardiovascular: Negative for chest pain, palpitations and leg swelling.  Gastrointestinal: Negative for nausea, vomiting, abdominal pain and diarrhea.  Genitourinary: Negative for dysuria and flank pain.  Musculoskeletal: Positive for back pain. Negative for arthralgias, myalgias, neck pain and neck stiffness.  Skin: Negative for rash.  Neurological: Negative for dizziness, weakness, numbness and headaches.  All other systems reviewed and are negative.     Allergies  Codeine and Iohexol  Home Medications   Prior to Admission medications   Medication Sig Start Date End Date Taking? Authorizing Provider  carvedilol (COREG) 25 MG tablet Take 1 tablet (25 mg total) by mouth 2 (two) times daily with a meal. 02/04/13   Thayer Headings, MD  Cetirizine HCl (ZYRTEC PO) Take  2 tablets by mouth daily.     Historical Provider, MD  citalopram (CELEXA) 40 MG tablet Take 40 mg by mouth as needed. 1 tab po qd/ pt not taking regularly  08/09/10   Historical Provider, MD  ipratropium (ATROVENT) 0.06 % nasal spray Place 2 sprays into both nostrils 4 (four) times daily. 06/16/13   Gregor Hams, MD  IRON PO Take by mouth daily.    Historical Provider, MD  losartan (COZAAR) 50 MG tablet TAKE 1 TABLET (50 MG TOTAL) BY MOUTH DAILY. 03/09/13   Thayer Headings, MD  rOPINIRole (REQUIP) 0.5 MG tablet Take 1 tablet (0.5 mg total) by mouth at bedtime. 02/07/13   Asencion Partridge Dohmeier, MD  valACYclovir (VALTREX) 500 MG tablet Take by mouth as needed.  03/27/12   Anastasio Auerbach, MD  zaleplon (SONATA) 10 MG capsule TAKE ONE CAPSULE BY MOUTH AT  BEDTIME AS NEEDED 02/03/14   Asencion Partridge Dohmeier, MD   BP 133/66  Pulse 79  Temp(Src) 98.3 F (36.8 C)  Resp 18  SpO2 97% Physical Exam  Nursing note and vitals reviewed. Constitutional: She appears well-developed and well-nourished. No distress.  HENT:  Head: Normocephalic.  Eyes: Conjunctivae are normal.  Neck: Neck supple.  Cardiovascular: Normal rate, regular rhythm and normal heart sounds.   Pulmonary/Chest: Effort normal and breath sounds normal. No respiratory distress. She has no wheezes. She has no rales.  Abdominal: Soft. Bowel sounds are normal. She exhibits no distension. There is no tenderness. There is no rebound.  Musculoskeletal: She exhibits no edema.  No midline lumbar spine tenderness. Tender over left SI joint and left buttock. Pain with left straight leg raise.  Neurological: She is alert.  5/5 and equal lower extremity strength. 2+ and equal patellar reflexes bilaterally. Pt able to dorsiflex bilateral toes and feet with good strength against resistance. Equal sensation bilaterally over thighs and lower legs.   Skin: Skin is warm and dry.  Psychiatric: She has a normal mood and affect. Her behavior is normal.    ED Course  Procedures (including critical care time) Labs Review Labs Reviewed - No data to display  Imaging Review Dg Lumbar Spine Complete  02/14/2014   CLINICAL DATA:  BACK PAIN BACK PAIN  EXAM: LUMBAR SPINE - COMPLETE 4+ VIEW  COMPARISON:  CT 02/24/2013  FINDINGS: There is no evidence of lumbar spine fracture. Alignment is normal. Intervertebral disc spaces are maintained. Small anterior endplate spurs at all lumbar levels. Surgical clips right upper abdomen. Patchy aortic calcifications without suggestion of aneurysm.  IMPRESSION: Negative.   Electronically Signed   By: Arne Cleveland M.D.   On: 02/14/2014 19:35     EKG Interpretation None      MDM   Final diagnoses:  Lumbar strain, initial encounter    Patient's with left lower back pain  after leaning forward. She has no symptoms or physical exam findings to suggest cauda equina at this time. She's afebrile. Abdomen is soft nontender. Pain reproducible with movement. Will get lumbar films, Norco and Robaxin ordered.  7:57 PM Lumbar spine films normal. Pain improved with 1 norco and 500mg  robaxin. Pt ambulated up and down hallway by her self. Able to get out and into bed with no assistance. At this time i suspect this is muscular injury. Plan to d/c home with robaxin, ultram, tylenol. Follow up with pcp.   Filed Vitals:   02/14/14 1808  BP: 133/66  Pulse: 79  Temp: 98.3 F (36.8 C)  Resp: 18  Renold Genta, PA-C 02/14/14 1958

## 2014-02-14 NOTE — ED Notes (Signed)
Patient knows not to drive home after receiving narcotics.

## 2014-02-14 NOTE — ED Notes (Signed)
Brought pt back to room via wheelchair with family in tow; pt getting undressed and into a gown at this time; Carmell Austria, EMT present in room

## 2014-02-16 NOTE — ED Provider Notes (Signed)
Medical screening examination/treatment/procedure(s) were performed by non-physician practitioner and as supervising physician I was immediately available for consultation/collaboration.   EKG Interpretation None       Liyat Faulkenberry, MD 02/16/14 1554 

## 2014-02-26 ENCOUNTER — Ambulatory Visit (INDEPENDENT_AMBULATORY_CARE_PROVIDER_SITE_OTHER): Payer: Medicare Other | Admitting: Gynecology

## 2014-02-26 ENCOUNTER — Encounter: Payer: Self-pay | Admitting: Gynecology

## 2014-02-26 ENCOUNTER — Other Ambulatory Visit (HOSPITAL_COMMUNITY)
Admission: RE | Admit: 2014-02-26 | Discharge: 2014-02-26 | Disposition: A | Discharge status: Home / Self Care | Payer: Medicare Other | Source: Ambulatory Visit | Attending: Gynecology | Admitting: Gynecology

## 2014-02-26 VITALS — BP 128/74 | Ht 65.0 in | Wt 189.0 lb

## 2014-02-26 DIAGNOSIS — N952 Postmenopausal atrophic vaginitis: Secondary | ICD-10-CM

## 2014-02-26 DIAGNOSIS — Z124 Encounter for screening for malignant neoplasm of cervix: Secondary | ICD-10-CM | POA: Diagnosis not present

## 2014-02-26 DIAGNOSIS — N950 Postmenopausal bleeding: Secondary | ICD-10-CM

## 2014-02-26 DIAGNOSIS — R82998 Other abnormal findings in urine: Secondary | ICD-10-CM | POA: Diagnosis not present

## 2014-02-26 NOTE — Patient Instructions (Signed)
Follow up for ultrasound as scheduled 

## 2014-02-26 NOTE — Addendum Note (Signed)
Addended by: Alen Blew on: 02/26/2014 03:11 PM   Modules accepted: Orders

## 2014-02-26 NOTE — Progress Notes (Signed)
Samantha [name] 10-Aug-1940 [id number]        73 y.o.  [id number] for follow up exam.  Past medical history,surgical history, problem list, medications, allergies, family history and social history were all reviewed and documented as reviewed in the EPIC chart.  ROS:  12 system ROS performed with pertinent positives and negatives included in the history, assessment and plan.   Additional significant findings :  none   Exam: Engineer, drilling Vitals:   02/26/14 1407  BP: 128/74  Height: 5\' 5"  (1.651 m)  Weight: 189 lb (85.73 kg)   General appearance:  Normal affect, orientation and appearance. Skin: Grossly normal HEENT: Without gross lesions.  No cervical or supraclavicular adenopathy. Thyroid normal.  Lungs:  Clear without wheezing, rales or rhonchi Cardiac: RR, without RMG Abdominal:  Soft, nontender, without masses, guarding, rebound, organomegaly or hernia Breasts:  Examined lying and sitting without masses, retractions, discharge or axillary adenopathy. Pelvic:  Ext/BUS/vagina with generalized atrophic changes  Cervix stenotic os. Pap done  Uterus axial to anteverted, normal size, shape and contour, midline and mobile nontender   Adnexa  Without masses or tenderness    Anus and perineum  Normal   Rectovaginal  Normal sphincter tone without palpated masses or tenderness.    Assessment/Plan:  73 y.o. [name] female for annual exam.   1. Postmenopausal bleeding. Patient relates having an episode of bright red blood in the toilet in July.  She went to see Dr. Collene Mares who said it was not rectal. Colonoscopy reported 2012.  Patient feels that they come from her vagina. No history of hematuria. No cramping or other discomfort. Recommend sonohysterogram to rule out intracavitary abnormalities. Cervix is stenotic I did discuss paracervical block with her.  Check baseline urinalysis. 2. History of leiomyoma reported on CT scans. Exam is grossly normal. We'll look at the time of ultrasound  but plan annual exam follow up as long as exam remain stable then we'll follow expectantly. 3. Postmenopausal/atrophic genital changes. Patient doing well without significant hot flushes, night sweats, vaginal dryness. Is not sexually active. Will continue to monitor. 4. Pap smear 2012. Pap was done today due to her history of bleeding. No history of abnormal Pap smears previously. 5. Mammography 11/2013. Continue with annual mammography. SBE monthly reviewed. 6. DEXA 2014 Dr. Loren Racer office historically.  We'll continue to follow up with him for bone health. I do not have copies of these reports. Increased calcium vitamin D reviewed. 7. Health maintenance. No routine blood work done as this is done at her primary physician's office. Follow up for sonohysterogram.   Note: This document was prepared with digital dictation and possible smart phrase technology. Any transcriptional errors that result from this process are unintentional.   Anastasio Auerbach MD, 2:25 PM 02/26/2014

## 2014-02-27 LAB — URINALYSIS W MICROSCOPIC + REFLEX CULTURE
Bacteria, UA: NONE SEEN
Bilirubin Urine: NEGATIVE
Casts: NONE SEEN
Crystals: NONE SEEN
Glucose, UA: NEGATIVE mg/dL
Ketones, ur: NEGATIVE mg/dL
Leukocytes, UA: NEGATIVE
Nitrite: NEGATIVE
Protein, ur: NEGATIVE mg/dL
Specific Gravity, Urine: 1.017 (ref 1.005–1.030)
Squamous Epithelial / LPF: NONE SEEN
Urobilinogen, UA: 0.2 mg/dL (ref 0.0–1.0)
pH: 6 (ref 5.0–8.0)

## 2014-03-01 ENCOUNTER — Other Ambulatory Visit: Payer: Self-pay | Admitting: Gynecology

## 2014-03-01 ENCOUNTER — Ambulatory Visit (INDEPENDENT_AMBULATORY_CARE_PROVIDER_SITE_OTHER): Payer: Medicare Other

## 2014-03-01 ENCOUNTER — Ambulatory Visit (INDEPENDENT_AMBULATORY_CARE_PROVIDER_SITE_OTHER): Payer: Medicare Other | Admitting: Gynecology

## 2014-03-01 ENCOUNTER — Encounter: Payer: Self-pay | Admitting: Gynecology

## 2014-03-01 DIAGNOSIS — N950 Postmenopausal bleeding: Secondary | ICD-10-CM

## 2014-03-01 DIAGNOSIS — D251 Intramural leiomyoma of uterus: Secondary | ICD-10-CM

## 2014-03-01 DIAGNOSIS — N83339 Acquired atrophy of ovary and fallopian tube, unspecified side: Secondary | ICD-10-CM

## 2014-03-01 DIAGNOSIS — R102 Pelvic and perineal pain: Secondary | ICD-10-CM

## 2014-03-01 DIAGNOSIS — N882 Stricture and stenosis of cervix uteri: Secondary | ICD-10-CM

## 2014-03-01 DIAGNOSIS — D219 Benign neoplasm of connective and other soft tissue, unspecified: Secondary | ICD-10-CM

## 2014-03-01 DIAGNOSIS — N949 Unspecified condition associated with female genital organs and menstrual cycle: Secondary | ICD-10-CM

## 2014-03-01 DIAGNOSIS — D259 Leiomyoma of uterus, unspecified: Secondary | ICD-10-CM | POA: Diagnosis not present

## 2014-03-01 DIAGNOSIS — N859 Noninflammatory disorder of uterus, unspecified: Secondary | ICD-10-CM | POA: Diagnosis not present

## 2014-03-01 LAB — URINE CULTURE: Colony Count: 9000

## 2014-03-01 MED ORDER — LIDOCAINE HCL 1 % IJ SOLN
5.0000 mL | Freq: Once | INTRAMUSCULAR | Status: AC
  Administered 2014-03-01: 5 mL

## 2014-03-01 NOTE — Progress Notes (Signed)
Samantha [name] 03-31-1941 [id number]        73 y.o.  [id number] Presents for sonohysterogram due to an episode of vaginal bleeding.  Past medical history,surgical history, problem list, medications, allergies, family history and social history were all reviewed and documented in the EPIC chart.  Directed ROS with pertinent positives and negatives documented in the history of present illness/assessment and plan.  Exam: Samantha [name] assistant General appearance:  Normal External BUS vagina with atrophic changes. Cervix atrophic stenotic appearing os. Uterus difficult to palpate. Adnexa without gross masses  Ultrasound shows uterus normal size with small 29 mm myoma abutting against the endometrial echo. Endometrial echo 2.3 mm. Right ovary not seen. Left ovary atrophic in appearance. Cul-de-sac negative.  Procedure: Due to her cervical stenosis the cervix was cleansed with Hibiclens, a paracervical block using 1% lidocaine, 6 cc total was placed. Single-tooth tenaculum anterior lip stabilization and the cervix was gently dilated with a disposable gradated dilator without difficulty. The sonohysterogram catheter was then placed. Distention of the cavity did cause the patient a fair amount of discomfort and it appeared that the cavity was negative with the leiomyoma abutting next to the cavity but not with any significant submucosal component. An endometrial sample was taken. The patient overall tolerated well.  Assessment/Plan:  73 y.o. [id number] with single episode of is menopausal bleeding. Ultrasound shows thin endometrial echo. Small myoma abutting against the endometrial cavity but without significant submucous component. Endometrial sample was taken. Patient will follow up for biopsy results. Assuming negative been planned expectant management with reporting of any further bleeding.   Note: This document was prepared with digital dictation and possible smart phrase technology. Any transcriptional  errors that result from this process are unintentional.   Samantha Auerbach MD, 1:02 PM 03/01/2014

## 2014-03-01 NOTE — Patient Instructions (Signed)
Office will call you with biopsy results 

## 2014-03-02 LAB — CYTOLOGY - PAP

## 2014-03-04 ENCOUNTER — Telehealth: Payer: Self-pay | Admitting: Gynecology

## 2014-03-04 NOTE — Telephone Encounter (Signed)
I called the patient with the biopsy results from her sonohysterogram which showed benign endocervical tissue but no endometrial tissue. The endometrial echo before the sonohysterogram measured 2.3 mm. The catheter was clearly in the endometrium during the ultrasound and I suspect that the lack of tissue is a reflection of atrophic changes. I reviewed with her though I cannot guarantee that she does not have uterine pathology that I missed with the biopsy and the option is to proceed with D&C hysteroscopy now or wait and if she does any further bleeding be more aggressive with hysteroscopy D&C then. The risk/benefits of surgery were reviewed versus missing pathology. Ultimately we both agree to monitor at present given the thin endometrium on endometrial echo and unlikelihood of significant pathology and she will call me if she does any further bleeding.

## 2014-03-15 ENCOUNTER — Other Ambulatory Visit: Payer: Self-pay | Admitting: Cardiovascular Disease

## 2014-03-23 ENCOUNTER — Other Ambulatory Visit: Payer: Self-pay | Admitting: Cardiovascular Disease

## 2014-04-14 ENCOUNTER — Encounter: Payer: Self-pay | Admitting: Cardiovascular Disease

## 2014-04-14 ENCOUNTER — Ambulatory Visit (INDEPENDENT_AMBULATORY_CARE_PROVIDER_SITE_OTHER): Payer: Medicare Other | Admitting: Cardiovascular Disease

## 2014-04-14 VITALS — BP 124/60 | HR 71 | Ht 65.0 in | Wt 185.2 lb

## 2014-04-14 DIAGNOSIS — I5022 Chronic systolic (congestive) heart failure: Secondary | ICD-10-CM

## 2014-04-14 DIAGNOSIS — I447 Left bundle-branch block, unspecified: Secondary | ICD-10-CM

## 2014-04-14 NOTE — Assessment & Plan Note (Signed)
Stable

## 2014-04-14 NOTE — Patient Instructions (Addendum)
REDUCE HIGH SODIUM FOODS LIKE CANNED SOUP, GRAVY, SAUCES, READY PREPARED FOODS LIKE FROZEN FOODS; LEAN CUISINE, LASAGNA. BACON, SAUSAGE, LUNCH MEAT, FAST FOODS, HOT DOGS, CHIPS, PIZZA.   Your physician recommends that you decrease your intake of carbohydrates, including foods that contain white flour and white sugar such as:  white rice, white bread, potatoes, crackers, chips, cookies, doughnuts, cakes, cookies, etc  Your physician recommends that you exercise at least 30 minutes 5 times per week  Your physician recommends that you continue on your current medications as directed. Please refer to the Current Medication list given to you today.  Your physician wants you to follow-up in: 6 months with Dr. Acie Fredrickson. You will receive a reminder letter in the mail two months in advance. If you don't receive a letter, please call our office to schedule the follow-up appointment.

## 2014-04-14 NOTE — Assessment & Plan Note (Addendum)
Seems to be stable . No significant dyspnea. She takes her medications but is not compliant with her diet. Encouraged her to improve her diet.

## 2014-04-14 NOTE — Progress Notes (Signed)
Samantha [name]    Samantha [name] Date of Birth  [birth year]       [facility]    [employer] [address] [place], [place], [place]  [address]   [place], [place]  [address] [phone]     [phone]   Fax  [phone]    Fax [phone]  Problem List: 1. Congestive heart failure- original EF 30-35%, now EF = 45-50% 2. History colon cancer-status post radiation and chemotherapy-April 2007 3. Smooth and normal coronary arteries by heart catheterization in 2000  History of Present Illness:  She has done well from a cardiac stand point.  She has had some dizziness and found her BP to be low yesterday.  She has occasional palpitations and frequently finds that her heart rate is about 100 with minimal exertion.  September 18, 2012:  Samantha [name] is doing well.  Still has some dyspnea - especially when bending over or stooping over.    No chest pain.  She eats an ice cream sundae every day, sometimes twice a day.   She still eats fast food almost all of the time.  She had an EF of 35% several years ago.  Her EF has improved to 45-50% with medical therapy.   Sept. 22, 2014:  Samantha [name] is doing well from a cardiac standpoint.  She has been found to have a lung nodule.   She has occasional episodes of dyspnea - especially with exertion. She is still eating salty foods - she salts everything that she eats.    October 05, 2013:  Doing well from a cardiac standpoint.  No CP,  Does have some dyspnea.    Has been out working out in the yard without problems.   Oct. 28, 2015:  Samantha [name] is doing well.  She finds occasional elevated BP.  She still eats salt.  Which likely explains her elevated BP  Current Outpatient Prescriptions on File Prior to Visit  Medication Sig Dispense Refill  . acetaminophen (TYLENOL) 500 MG tablet Take 500-1,000 mg by mouth every 6 (six) hours as needed for headache (pain).      . carvedilol (COREG) 25 MG tablet TAKE 1 TABLET (25 MG TOTAL) BY MOUTH  2 (TWO) TIMES DAILY WITH A MEAL.  180 tablet  0  . cetirizine (ZYRTEC) 10 MG tablet Take 10 mg by mouth daily.      . citalopram (CELEXA) 40 MG tablet Take 40 mg by mouth as needed (depression). 1 tab po qd/ pt not taking regularly       . losartan (COZAAR) 50 MG tablet Take 50 mg by mouth daily.      . methocarbamol (ROBAXIN) 500 MG tablet Take 1 tablet (500 mg total) by mouth every 8 (eight) hours as needed for muscle spasms.  15 tablet  0  . rOPINIRole (REQUIP) 0.5 MG tablet Take 1 tablet (0.5 mg total) by mouth at bedtime.  90 tablet  3  . traMADol (ULTRAM) 50 MG tablet Take 1 tablet (50 mg total) by mouth every 6 (six) hours as needed.  15 tablet  0  . zaleplon (SONATA) 10 MG capsule Take 10 mg by mouth at bedtime as needed for sleep.       No current facility-administered medications on file prior to visit.    Allergies  Allergen Reactions  . Codeine     Upset stomach    . Iohexol Hives     Desc: Pt. states she broke out  in hives 30 yrs ago w/ a CT Brain scan.  She has since had a heart cath and this CT today (08/10/05) w/o premeds and did well. No evident reaction.  Thanks.   . Shellfish Allergy Itching    Past history of itching with shrimp, has a history of allergy shots , no problem with seafood in years per patient.    Past Medical History  Diagnosis Date  . Conjunctivitis   . Febrile neutropenia   . Anemia   . Hypokalemia   . Mucositis   . LBBB (left bundle branch block)   . Nonischemic cardiomyopathy     EF 30 to 35%  . S/P cardiac cath 2000    Normal coronaries  . CHD (coronary heart disease)   . Renal artery aneurysm   . CHF (congestive heart failure)   . Cancer of colon 08/2005    STAGE 1 CHEMO AND RADIATION  . Sleep apnea     Past Surgical History  Procedure Laterality Date  . Appendectomy    . Cesarean section    . Cholecystectomy    . Tonsillectomy    . Port-a-cath removal    . US echocardiography  3/12    EF 30 to 35%  . Hemorroidectomy    .  Tubal ligation    . Cataract surgery removal      bilateral    History  Smoking status  . Never Smoker   Smokeless tobacco  . Never Used    History  Alcohol Use No    Family History  Problem Relation Age of Onset  . Diabetes Sister   . Cancer Brother     PROSTATE    Reviw of Systems:  Reviewed in the HPI.  All other systems are negative.  Physical Exam: Blood pressure 124/60, pulse 71, height 5\' 5"  (1.651 m), weight 185 lb 3.2 oz (84.006 kg). General: Well developed, well nourished, in no acute distress.  Head: Normocephalic, atraumatic, sclera non-icteric, mucus membranes are moist,   Neck: Supple. Carotids are 2 + without bruits. No JVD  Lungs: Clear bilaterally to auscultation.  Heart: regular rate.  normal  S1 S2. No murmurs, gallops or rubs.  Abdomen: Soft, non-tender, non-distended with normal bowel sounds. No hepatomegaly. No rebound/guarding. No masses.  Msk:  Strength and tone are normal  Extremities: No clubbing or cyanosis. No edema.  Distal pedal pulses are 2+ and equal bilaterally.  Neuro: Alert and oriented X 3. Moves all extremities spontaneously.  Psych:  Responds to questions appropriately with a normal affect.  ECG: 04/06/2014: Normal sinus rhythm. Nonspecific IVCD  Assessment / Plan:

## 2014-04-19 ENCOUNTER — Encounter: Payer: Self-pay | Admitting: Cardiovascular Disease

## 2014-04-27 DIAGNOSIS — Z23 Encounter for immunization: Secondary | ICD-10-CM | POA: Diagnosis not present

## 2014-04-27 DIAGNOSIS — I351 Nonrheumatic aortic (valve) insufficiency: Secondary | ICD-10-CM | POA: Diagnosis not present

## 2014-04-27 DIAGNOSIS — M1990 Unspecified osteoarthritis, unspecified site: Secondary | ICD-10-CM | POA: Diagnosis not present

## 2014-04-27 DIAGNOSIS — M859 Disorder of bone density and structure, unspecified: Secondary | ICD-10-CM | POA: Diagnosis not present

## 2014-04-27 DIAGNOSIS — G4733 Obstructive sleep apnea (adult) (pediatric): Secondary | ICD-10-CM | POA: Diagnosis not present

## 2014-04-27 DIAGNOSIS — I722 Aneurysm of renal artery: Secondary | ICD-10-CM | POA: Diagnosis not present

## 2014-04-27 DIAGNOSIS — D649 Anemia, unspecified: Secondary | ICD-10-CM | POA: Diagnosis not present

## 2014-04-27 DIAGNOSIS — M353 Polymyalgia rheumatica: Secondary | ICD-10-CM | POA: Diagnosis not present

## 2014-06-10 ENCOUNTER — Other Ambulatory Visit: Payer: Self-pay | Admitting: Cardiovascular Disease

## 2014-06-16 ENCOUNTER — Other Ambulatory Visit: Payer: Self-pay | Admitting: Gynecology

## 2014-06-16 ENCOUNTER — Other Ambulatory Visit: Payer: Self-pay | Admitting: Cardiovascular Disease

## 2014-06-19 ENCOUNTER — Other Ambulatory Visit: Payer: Self-pay | Admitting: Cardiovascular Disease

## 2014-07-20 DIAGNOSIS — D225 Melanocytic nevi of trunk: Secondary | ICD-10-CM | POA: Diagnosis not present

## 2014-07-20 DIAGNOSIS — L708 Other acne: Secondary | ICD-10-CM | POA: Diagnosis not present

## 2014-07-20 DIAGNOSIS — L814 Other melanin hyperpigmentation: Secondary | ICD-10-CM | POA: Diagnosis not present

## 2014-07-20 DIAGNOSIS — L821 Other seborrheic keratosis: Secondary | ICD-10-CM | POA: Diagnosis not present

## 2014-09-02 ENCOUNTER — Emergency Department (HOSPITAL_COMMUNITY)
Admission: EM | Admit: 2014-09-02 | Discharge: 2014-09-02 | Disposition: A | Discharge status: Home / Self Care | Payer: Medicare Other | Attending: Emergency Medicine | Admitting: Emergency Medicine

## 2014-09-02 ENCOUNTER — Encounter (HOSPITAL_COMMUNITY): Payer: Self-pay | Admitting: *Deleted

## 2014-09-02 ENCOUNTER — Emergency Department (HOSPITAL_COMMUNITY): Payer: Medicare Other

## 2014-09-02 DIAGNOSIS — Z85038 Personal history of other malignant neoplasm of large intestine: Secondary | ICD-10-CM | POA: Diagnosis not present

## 2014-09-02 DIAGNOSIS — Z8719 Personal history of other diseases of the digestive system: Secondary | ICD-10-CM | POA: Insufficient documentation

## 2014-09-02 DIAGNOSIS — Z8639 Personal history of other endocrine, nutritional and metabolic disease: Secondary | ICD-10-CM | POA: Diagnosis not present

## 2014-09-02 DIAGNOSIS — Z862 Personal history of diseases of the blood and blood-forming organs and certain disorders involving the immune mechanism: Secondary | ICD-10-CM | POA: Insufficient documentation

## 2014-09-02 DIAGNOSIS — I2510 Atherosclerotic heart disease of native coronary artery without angina pectoris: Secondary | ICD-10-CM | POA: Diagnosis not present

## 2014-09-02 DIAGNOSIS — Z8669 Personal history of other diseases of the nervous system and sense organs: Secondary | ICD-10-CM | POA: Insufficient documentation

## 2014-09-02 DIAGNOSIS — I959 Hypotension, unspecified: Secondary | ICD-10-CM | POA: Diagnosis not present

## 2014-09-02 DIAGNOSIS — Z8673 Personal history of transient ischemic attack (TIA), and cerebral infarction without residual deficits: Secondary | ICD-10-CM | POA: Insufficient documentation

## 2014-09-02 DIAGNOSIS — R0602 Shortness of breath: Secondary | ICD-10-CM | POA: Diagnosis present

## 2014-09-02 DIAGNOSIS — I509 Heart failure, unspecified: Secondary | ICD-10-CM | POA: Diagnosis not present

## 2014-09-02 LAB — COMPREHENSIVE METABOLIC PANEL
ALT: 21 U/L (ref 0–35)
AST: 23 U/L (ref 0–37)
Albumin: 4 g/dL (ref 3.5–5.2)
Alkaline Phosphatase: 65 U/L (ref 39–117)
Anion gap: 9 (ref 5–15)
BUN: 17 mg/dL (ref 6–23)
CO2: 27 mmol/L (ref 19–32)
Calcium: 9.4 mg/dL (ref 8.4–10.5)
Chloride: 101 mmol/L (ref 96–112)
Creatinine, Ser: 1.06 mg/dL (ref 0.50–1.10)
GFR calc Af Amer: 59 mL/min — ABNORMAL LOW (ref >90)
GFR calc non Af Amer: 51 mL/min — ABNORMAL LOW (ref >90)
Glucose, Bld: 117 mg/dL — ABNORMAL HIGH (ref 70–99)
Potassium: 3.9 mmol/L (ref 3.5–5.1)
Sodium: 137 mmol/L (ref 135–145)
Total Bilirubin: 0.6 mg/dL (ref 0.3–1.2)
Total Protein: 7.3 g/dL (ref 6.0–8.3)

## 2014-09-02 LAB — CBC
HCT: 37.1 % (ref 36.0–46.0)
Hemoglobin: 12.2 g/dL (ref 12.0–15.0)
MCH: 31.6 pg (ref 26.0–34.0)
MCHC: 32.9 g/dL (ref 30.0–36.0)
MCV: 96.1 fL (ref 78.0–100.0)
Platelets: 228 10*3/uL (ref 150–400)
RBC: 3.86 MIL/uL — ABNORMAL LOW (ref 3.87–5.11)
RDW: 12.6 % (ref 11.5–15.5)
WBC: 5.9 10*3/uL (ref 4.0–10.5)

## 2014-09-02 LAB — I-STAT TROPONIN, ED: Troponin i, poc: 0 ng/mL (ref 0.00–0.08)

## 2014-09-02 LAB — BRAIN NATRIURETIC PEPTIDE: B Natriuretic Peptide: 77.2 pg/mL (ref 0.0–100.0)

## 2014-09-02 NOTE — ED Notes (Signed)
Patient presents today with complaints of SOB and low blood pressure. She is clear on auscultation but winded during conversation and lying flat. BLE edema +1 pitting which is new per patient. She has experienced diareah for the past week with one day of constant BM's. Denies pain.

## 2014-09-02 NOTE — Discharge Instructions (Signed)
Hypotension  As your heart beats, it forces blood through your arteries. This force is your blood pressure. If your blood pressure is too low for you to go about your normal activities or to support the organs of your body, you have hypotension. Hypotension is also referred to as low blood pressure. When your blood pressure becomes too low, you may not get enough blood to your brain. As a result, you may feel weak, feel lightheaded, or develop a rapid heart rate. In a more severe case, you may faint.  CAUSES  Various conditions can cause hypotension. These include:  · Blood loss.  · Dehydration.  · Heart or endocrine problems.  · Pregnancy.  · Severe infection.  · Not having a well-balanced diet filled with needed nutrients.  · Severe allergic reactions (anaphylaxis).  Some medicines, such as blood pressure medicine or water pills (diuretics), may lower your blood pressure below normal. Sometimes taking too much medicine or taking medicine not as directed can cause hypotension.  TREATMENT   Hospitalization is sometimes required for hypotension if fluid or blood replacement is needed, if time is needed for medicines to wear off, or if further monitoring is needed. Treatment might include changing your diet, changing your medicines (including medicines aimed at raising your blood pressure), and use of support stockings.  HOME CARE INSTRUCTIONS   · Drink enough fluids to keep your urine clear or pale yellow.  · Take your medicines as directed by your health care provider.  · Get up slowly from reclining or sitting positions. This gives your blood pressure a chance to adjust.  · Wear support stockings as directed by your health care provider.  · Maintain a healthy diet by including nutritious food, such as fruits, vegetables, nuts, whole grains, and lean meats.  SEEK MEDICAL CARE IF:  · You have vomiting or diarrhea.  · You have a fever for more than 2-3 days.  · You feel more thirsty than usual.  · You feel weak and  tired.  SEEK IMMEDIATE MEDICAL CARE IF:   · You have chest pain or a fast or irregular heartbeat.  · You have a loss of feeling in some part of your body, or you lose movement in your arms or legs.  · You have trouble speaking.  · You become sweaty or feel lightheaded.  · You faint.  MAKE SURE YOU:   · Understand these instructions.  · Will watch your condition.  · Will get help right away if you are not doing well or get worse.  Document Released: 06/04/2005 Document Revised: 03/25/2013 Document Reviewed: 12/05/2012  [employer]® Patient Information ©2015 [employer]. This information is not intended to replace advice given to you by your health care provider. Make sure you discuss any questions you have with your health care provider.

## 2014-09-02 NOTE — ED Notes (Signed)
Patient states understanding to follow up with MD concerning BP medication adjustment and amounts of water to stay hydrated.

## 2014-09-02 NOTE — ED Provider Notes (Signed)
CSN: [id number]     Arrival date & time 09/02/14  1644 History   First MD Initiated Contact with Patient 09/02/14 2022     Chief Complaint  Patient presents with  . Shortness of Breath  . Hypotension     (Consider location/radiation/quality/duration/timing/severity/associated sxs/prior Treatment) HPI    Samantha [name] is a 74 y.o. female who presents for evaluation of periods of weakness, lethargy, and low blood pressure. She has had pressures ranging from 90/42, to 140/80. She is taking her usual medications. She feels like she is drinking and eating somewhat less than usual for no particular reason. She has been working outside in the heat, this week. She denies fever, chills, chest pain, shortness of breath, nausea, vomiting, dysuria, change in bowel habits or isolated weakness. She does have urinary frequency. She's not had hematuria. There are no recent changes in her medical treatment plan. There are no other known modifying factors.   Past Medical History  Diagnosis Date  . Conjunctivitis   . Febrile neutropenia   . Anemia   . Hypokalemia   . Mucositis   . LBBB (left bundle branch block)   . Nonischemic cardiomyopathy     EF 30 to 35%  . S/P cardiac cath 2000    Normal coronaries  . CHD (coronary heart disease)   . Renal artery aneurysm   . CHF (congestive heart failure)   . Cancer of colon 08/2005    STAGE 1 CHEMO AND RADIATION  . Sleep apnea    Past Surgical History  Procedure Laterality Date  . Appendectomy    . Cesarean section    . Cholecystectomy    . Tonsillectomy    . Port-a-cath removal    . US echocardiography  3/12    EF 30 to 35%  . Hemorroidectomy    . Tubal ligation    . Cataract surgery removal      bilateral   Family History  Problem Relation Age of Onset  . Diabetes Sister   . Cancer Brother     PROSTATE   History  Substance Use Topics  . Smoking status: Never Smoker   . Smokeless tobacco: Never Used  . Alcohol Use: No   OB History     Gravida Para Term Preterm AB TAB SAB Ectopic Multiple Living   2 2 2       2      Review of Systems  All other systems reviewed and are negative.     Allergies  Codeine; Iohexol; and Shellfish allergy  Home Medications   Prior to Admission medications   Medication Sig Start Date End Date Taking? Authorizing Provider  acetaminophen (TYLENOL) 500 MG tablet Take 500-1,000 mg by mouth every 6 (six) hours as needed for headache (pain).    Historical Provider, MD  carvedilol (COREG) 25 MG tablet TAKE 1 TABLET (25 MG TOTAL) BY MOUTH 2 (TWO) TIMES DAILY WITH A MEAL. 06/14/14   Thayer Headings, MD  cetirizine (ZYRTEC) 10 MG tablet Take 10 mg by mouth daily.    Historical Provider, MD  citalopram (CELEXA) 40 MG tablet Take 40 mg by mouth as needed (depression). 1 tab po qd/ pt not taking regularly  08/09/10   Historical Provider, MD  losartan (COZAAR) 50 MG tablet Take 50 mg by mouth daily.    Historical Provider, MD  losartan (COZAAR) 50 MG tablet TAKE 1 TABLET (50 MG TOTAL) BY MOUTH DAILY. 06/16/14   Thayer Headings, MD  methocarbamol Sherlene Shams)  500 MG tablet Take 1 tablet (500 mg total) by mouth every 8 (eight) hours as needed for muscle spasms. 02/14/14   Tatyana Kirichenko, PA-C  rOPINIRole (REQUIP) 0.5 MG tablet Take 1 tablet (0.5 mg total) by mouth at bedtime. 02/07/13   Asencion Partridge Dohmeier, MD  traMADol (ULTRAM) 50 MG tablet Take 1 tablet (50 mg total) by mouth every 6 (six) hours as needed. 02/14/14   Tatyana Kirichenko, PA-C  valACYclovir (VALTREX) 500 MG tablet TAKE 1 TABLET BY MOUTH TWICE A DAY FOR 5 DAYS 06/17/14   Anastasio Auerbach, MD  zaleplon (SONATA) 10 MG capsule Take 10 mg by mouth at bedtime as needed for sleep.    Historical Provider, MD   BP 141/66 mmHg  Pulse 64  Temp(Src) 97.9 F (36.6 C) (Oral)  Resp 13  SpO2 96% Physical Exam  Constitutional: She is oriented to person, place, and time. She appears well-developed.  Elderly, frail  HENT:  Head: Normocephalic and  atraumatic.  Right Ear: External ear normal.  Left Ear: External ear normal.  Eyes: Conjunctivae and EOM are normal. Pupils are equal, round, and reactive to light.  Neck: Normal range of motion and phonation normal. Neck supple.  Cardiovascular: Normal rate, regular rhythm and normal heart sounds.   Blood pressure at the time of evaluation 153/62.  Pulmonary/Chest: Effort normal and breath sounds normal. She exhibits no bony tenderness.  Abdominal: Soft. There is no tenderness.  Musculoskeletal: Normal range of motion.  Neurological: She is alert and oriented to person, place, and time. No cranial nerve deficit or sensory deficit. She exhibits normal muscle tone. Coordination normal.  No dysarthria and aphasia or nystagmus. She has a mild nonspecific tremor.  Skin: Skin is warm, dry and intact.  Psychiatric: She has a normal mood and affect. Her behavior is normal. Judgment and thought content normal.  Nursing note and vitals reviewed.   ED Course  Procedures (including critical care time)  Medications - No data to display  Patient Vitals for the past 24 hrs:  BP Temp Temp src Pulse Resp SpO2  09/02/14 2144 - 97.9 F (36.6 C) Oral - - -  09/02/14 2130 141/66 mmHg - - 64 13 96 %  09/02/14 2115 142/64 mmHg - - 66 13 98 %  09/02/14 2100 146/100 mmHg - - 72 14 98 %  09/02/14 2045 153/61 mmHg - - (!) 59 14 99 %  09/02/14 2030 143/65 mmHg - - 66 16 98 %  09/02/14 2022 - - - - - 99 %  09/02/14 2015 135/61 mmHg - - 69 - 99 %  09/02/14 1650 137/76 mmHg 98.1 F (36.7 C) Oral 90 20 95 %    Findings discussed with patient and family members. All questions answered.    Labs Review Labs Reviewed  CBC - Abnormal; Notable for the following:    RBC 3.86 (*)    All other components within normal limits  COMPREHENSIVE METABOLIC PANEL - Abnormal; Notable for the following:    Glucose, Bld 117 (*)    GFR calc non Af Amer 51 (*)    GFR calc Af Amer 59 (*)    All other components within  normal limits  BRAIN NATRIURETIC PEPTIDE  I-STAT TROPOININ, ED    Imaging Review Dg Chest 2 View  09/02/2014   CLINICAL DATA:  Shortness of breath with irregular heartbeats since this morning.  EXAM: CHEST  2 VIEW  COMPARISON:  06/04/2011  FINDINGS: Lungs are adequately inflated without consolidation or  effusion. Cardiomediastinal silhouette, bones and soft tissues are unchanged.  IMPRESSION: No active cardiopulmonary disease.   Electronically Signed   By: Marin Olp M.D.   On: 09/02/2014 17:10     EKG Interpretation   Date/Time:  Thursday September 02 2014 16:54:46 EDT Ventricular Rate:  78 PR Interval:  156 QRS Duration: 140 QT Interval:  456 QTC Calculation: 519 R Axis:   -7 Text Interpretation:  Normal sinus rhythm Left bundle branch block  Abnormal ECG Since last tracing Left bundle branch block is new Confirmed  by Eulis Foster  MD, Shoshanah Dapper ([id number]) on 09/02/2014 8:46:02 PM      MDM   Final diagnoses:  Hypotension, unspecified hypotension type      Periods of soft, and mild hypertension. She has mild nonspecific weakness, without evidence for ACS, PE or suggested, serious bacterial infection. She does have a new left bundle branch block, which is a nonspecific finding in this scenario. She is not having chest pain. There is no clear indication for changing her medication treatment, at this time.   Nursing Notes Reviewed/ Care Coordinated Applicable Imaging Reviewed Interpretation of Laboratory Data incorporated into ED treatment  The patient appears reasonably screened and/or stabilized for discharge and I doubt any other medical condition or other [facility] requiring further screening, evaluation, or treatment in the ED at this time prior to discharge.  Plan: Home Medications- usual; Home Treatments- rest; return here if the recommended treatment, does not improve the symptoms; Recommended follow up- PCP 1 week    Daleen Bo, MD 09/03/14 1318

## 2014-09-02 NOTE — ED Notes (Signed)
Pt c/o sob since yesterday and bp of 96/44 at home (presently 137/76).  Pt also c/o intermittent chest pain since yesterday.

## 2014-09-19 ENCOUNTER — Other Ambulatory Visit: Payer: Self-pay | Admitting: Cardiovascular Disease

## 2014-10-18 ENCOUNTER — Ambulatory Visit (INDEPENDENT_AMBULATORY_CARE_PROVIDER_SITE_OTHER): Payer: Medicare Other | Admitting: Cardiovascular Disease

## 2014-10-18 ENCOUNTER — Encounter: Payer: Self-pay | Admitting: Cardiovascular Disease

## 2014-10-18 VITALS — BP 100/67 | HR 67 | Ht 65.0 in | Wt 185.8 lb

## 2014-10-18 DIAGNOSIS — I5022 Chronic systolic (congestive) heart failure: Secondary | ICD-10-CM

## 2014-10-18 DIAGNOSIS — I447 Left bundle-branch block, unspecified: Secondary | ICD-10-CM | POA: Diagnosis not present

## 2014-10-18 NOTE — Patient Instructions (Signed)
Medication Instructions:  Your physician recommends that you continue on your current medications as directed. Please refer to the Current Medication list given to you today.   Labwork: None  Testing/Procedures: None  Follow-Up: Your physician wants you to follow-up in: 6 months with Dr. Nahser.  You will receive a reminder letter in the mail two months in advance. If you don't receive a letter, please call our office to schedule the follow-up appointment.      

## 2014-10-18 NOTE — Progress Notes (Signed)
Cardiology Office Note   Date:  10/18/2014   ID:  [name], DOB September 17, 1940, MRN [id number]  PCP:  Haywood Pao, MD  Cardiologist:   Thayer Headings, MD   Chief Complaint  Patient presents with  . Follow-up    CHF   1. Congestive heart failure- original EF 30-35%, now EF = 45-50% 2. History colon cancer-status post radiation and chemotherapy-April 2007 3. Smooth and normal coronary arteries by heart catheterization in 2000  History of Present Illness:  She has done well from a cardiac stand point. She has had some dizziness and found her BP to be low yesterday. She has occasional palpitations and frequently finds that her heart rate is about 100 with minimal exertion.  September 18, 2012:  Samantha [name] is doing well. Still has some dyspnea - especially when bending over or stooping over. No chest pain. She eats an ice cream sundae every day, sometimes twice a day. She still eats fast food almost all of the time. She had an EF of 35% several years ago. Her EF has improved to 45-50% with medical therapy.   Sept. 22, 2014:  Samantha [name] is doing well from a cardiac standpoint. She has been found to have a lung nodule.  She has occasional episodes of dyspnea - especially with exertion. She is still eating salty foods - she salts everything that she eats.   October 05, 2013:  Doing well from a cardiac standpoint. No CP, Does have some dyspnea. Has been out working out in the yard without problems.   Oct. 28, 2015:  Syna is doing well. She finds occasional elevated BP.  She still eats salt. Which likely explains her elevated BP   Oct 18, 2014:  Samantha [name] is a 74 y.o. female who presents for follow up of her CHF. She has floaters in her visual field this am Breathing is ok. Occasionally gets short of breath.       Past Medical History  Diagnosis Date  . Conjunctivitis   . Febrile neutropenia   . Anemia   . Hypokalemia   . Mucositis   . LBBB (left  bundle branch block)   . Nonischemic cardiomyopathy     EF 30 to 35%  . S/P cardiac cath 2000    Normal coronaries  . CHD (coronary heart disease)   . Renal artery aneurysm   . CHF (congestive heart failure)   . Cancer of colon 08/2005    STAGE 1 CHEMO AND RADIATION  . Sleep apnea     Past Surgical History  Procedure Laterality Date  . Appendectomy    . Cesarean section    . Cholecystectomy    . Tonsillectomy    . Port-a-cath removal    . US echocardiography  3/12    EF 30 to 35%  . Hemorroidectomy    . Tubal ligation    . Cataract surgery removal      bilateral     Current Outpatient Prescriptions  Medication Sig Dispense Refill  . acetaminophen (TYLENOL) 500 MG tablet Take 500-1,000 mg by mouth every 6 (six) hours as needed for headache (pain).    . carvedilol (COREG) 25 MG tablet TAKE 1 TABLET (25 MG TOTAL) BY MOUTH 2 (TWO) TIMES DAILY WITH A MEAL. 180 tablet 1  . cetirizine (ZYRTEC) 10 MG tablet Take 10 mg by mouth daily.    . citalopram (CELEXA) 40 MG tablet Take 40 mg by mouth as needed (depression). 1 tab po  qd/ pt not taking regularly     . losartan (COZAAR) 50 MG tablet Take 50 mg by mouth daily.    Marland Kitchen rOPINIRole (REQUIP) 0.5 MG tablet Take 1 tablet (0.5 mg total) by mouth at bedtime. 90 tablet 3  . triamcinolone cream (KENALOG) 0.1 % Apply 0.1 application topically 2 (two) times daily. APPLY TO AFFECTED AREA TWICE DAILY AS NEEDED. DO NOT APPLY TO FACE, GROIN,UNDERARMS.  2  . valACYclovir (VALTREX) 500 MG tablet TAKE 1 TABLET BY MOUTH TWICE A DAY FOR 5 DAYS 10 tablet 2  . zaleplon (SONATA) 10 MG capsule Take 10 mg by mouth at bedtime as needed for sleep.     No current facility-administered medications for this visit.    Allergies:   Codeine; Iohexol; and Shellfish allergy    Social History:  The patient  reports that she has never smoked. She has never used smokeless tobacco. She reports that she does not drink alcohol or use illicit drugs.   Family History:   The patient's family history includes Cancer in her brother; Diabetes in her sister.    ROS:  Please see the history of present illness.    Review of Systems: Constitutional:  denies fever, chills, diaphoresis, appetite change and fatigue.  HEENT: denies photophobia, eye pain, redness, hearing loss, ear pain, congestion, sore throat, rhinorrhea, sneezing, neck pain, neck stiffness and tinnitus.  Respiratory: denies SOB, DOE, cough, chest tightness, and wheezing.  Cardiovascular: denies chest pain, palpitations and leg swelling.  Gastrointestinal: denies nausea, vomiting, abdominal pain, diarrhea, constipation, blood in stool.  Genitourinary: denies dysuria, urgency, frequency, hematuria, flank pain and difficulty urinating.  Musculoskeletal: denies  myalgias, back pain, joint swelling, arthralgias and gait problem.   Skin: denies pallor, rash and wound.  Neurological: denies dizziness, seizures, syncope, weakness, light-headedness, numbness and headaches.   Hematological: denies adenopathy, easy bruising, personal or family bleeding history.  Psychiatric/ Behavioral: denies suicidal ideation, mood changes, confusion, nervousness, sleep disturbance and agitation.       All other systems are reviewed and negative.    PHYSICAL EXAM: VS:  BP 100/67 mmHg  Pulse 67  Ht 5\' 5"  (1.651 m)  Wt 185 lb 12.8 oz (84.278 kg)  BMI 30.92 kg/m2 , BMI Body mass index is 30.92 kg/(m^2). GEN: Well nourished, well developed, in no acute distress HEENT: normal Neck: no JVD, carotid bruits, or masses Cardiac: RRR; no murmurs, rubs, or gallops,no edema  Respiratory:  clear to auscultation bilaterally, normal work of breathing GI: soft, nontender, nondistended, + BS MS: no deformity or atrophy Skin: warm and dry, no rash Neuro:  Strength and sensation are intact Psych: normal   EKG:  EKG is not ordered today. The ekg ordered today demonstrates    Recent Labs: 09/02/2014: ALT 21; B Natriuretic  Peptide 77.2; BUN 17; Creatinine 1.06; Hemoglobin 12.2; Platelets 228; Potassium 3.9; Sodium 137    Lipid Panel    Component Value Date/Time   CHOL 185 03/10/2013 1142   TRIG 91.0 03/10/2013 1142   HDL 57.30 03/10/2013 1142   CHOLHDL 3 03/10/2013 1142   VLDL 18.2 03/10/2013 1142   LDLCALC 110* 03/10/2013 1142      Wt Readings from Last 3 Encounters:  10/18/14 185 lb 12.8 oz (84.278 kg)  04/14/14 185 lb 3.2 oz (84.006 kg)  02/26/14 189 lb (85.73 kg)      Other studies Reviewed: Additional studies/ records that were reviewed today include: . Review of the above records demonstrates:    ASSESSMENT AND  PLAN:  1. Congestive heart failure- original EF 30-35%, now EF = 45-50%,  She has not had any recent problems .  Continue same meds  2. History colon cancer-status post radiation and chemotherapy-April 2007  3. Smooth and normal coronary arteries by heart catheterization in 2000   Current medicines are reviewed at length with the patient today.  The patient does not have concerns regarding medicines.  The following changes have been made:  no change  Labs/ tests ordered today include:  No orders of the defined types were placed in this encounter.     Disposition:   FU with me in 6 months      Estanislao Harmon, Wonda Cheng, MD  10/18/2014 12:07 PM    [address], [place], [place]  [address] Phone: [phone]; Fax: [phone]   [facility]  [address] [place], [place]  [address] [phone]    Fax [phone]

## 2014-10-29 DIAGNOSIS — I341 Nonrheumatic mitral (valve) prolapse: Secondary | ICD-10-CM | POA: Diagnosis not present

## 2014-10-29 DIAGNOSIS — M859 Disorder of bone density and structure, unspecified: Secondary | ICD-10-CM | POA: Diagnosis not present

## 2014-10-29 DIAGNOSIS — Z79899 Other long term (current) drug therapy: Secondary | ICD-10-CM | POA: Diagnosis not present

## 2014-10-29 DIAGNOSIS — D649 Anemia, unspecified: Secondary | ICD-10-CM | POA: Diagnosis not present

## 2014-10-29 DIAGNOSIS — R8290 Unspecified abnormal findings in urine: Secondary | ICD-10-CM | POA: Diagnosis not present

## 2014-11-04 DIAGNOSIS — G2581 Restless legs syndrome: Secondary | ICD-10-CM | POA: Diagnosis not present

## 2014-11-04 DIAGNOSIS — Z1389 Encounter for screening for other disorder: Secondary | ICD-10-CM | POA: Diagnosis not present

## 2014-11-04 DIAGNOSIS — M859 Disorder of bone density and structure, unspecified: Secondary | ICD-10-CM | POA: Diagnosis not present

## 2014-11-04 DIAGNOSIS — Z6831 Body mass index (BMI) 31.0-31.9, adult: Secondary | ICD-10-CM | POA: Diagnosis not present

## 2014-11-04 DIAGNOSIS — D259 Leiomyoma of uterus, unspecified: Secondary | ICD-10-CM | POA: Diagnosis not present

## 2014-11-04 DIAGNOSIS — Z0000 Encounter for general adult medical examination without abnormal findings: Secondary | ICD-10-CM | POA: Diagnosis not present

## 2014-11-04 DIAGNOSIS — F321 Major depressive disorder, single episode, moderate: Secondary | ICD-10-CM | POA: Diagnosis not present

## 2014-11-04 DIAGNOSIS — G4733 Obstructive sleep apnea (adult) (pediatric): Secondary | ICD-10-CM | POA: Diagnosis not present

## 2014-11-04 DIAGNOSIS — E559 Vitamin D deficiency, unspecified: Secondary | ICD-10-CM | POA: Diagnosis not present

## 2014-11-04 DIAGNOSIS — M1990 Unspecified osteoarthritis, unspecified site: Secondary | ICD-10-CM | POA: Diagnosis not present

## 2014-11-04 DIAGNOSIS — D649 Anemia, unspecified: Secondary | ICD-10-CM | POA: Diagnosis not present

## 2014-11-09 DIAGNOSIS — Z1212 Encounter for screening for malignant neoplasm of rectum: Secondary | ICD-10-CM | POA: Diagnosis not present

## 2014-11-25 ENCOUNTER — Encounter: Payer: Self-pay | Admitting: Neurology

## 2014-11-25 ENCOUNTER — Ambulatory Visit (INDEPENDENT_AMBULATORY_CARE_PROVIDER_SITE_OTHER): Payer: Medicare Other | Admitting: Neurology

## 2014-11-25 VITALS — BP 122/60 | HR 76 | Resp 20 | Ht 64.96 in | Wt 188.0 lb

## 2014-11-25 DIAGNOSIS — D509 Iron deficiency anemia, unspecified: Secondary | ICD-10-CM | POA: Insufficient documentation

## 2014-11-25 DIAGNOSIS — G2581 Restless legs syndrome: Secondary | ICD-10-CM

## 2014-11-25 DIAGNOSIS — R5383 Other fatigue: Secondary | ICD-10-CM | POA: Diagnosis not present

## 2014-11-25 DIAGNOSIS — G4733 Obstructive sleep apnea (adult) (pediatric): Secondary | ICD-10-CM | POA: Diagnosis not present

## 2014-11-25 DIAGNOSIS — G479 Sleep disorder, unspecified: Secondary | ICD-10-CM | POA: Diagnosis not present

## 2014-11-25 DIAGNOSIS — Z9989 Dependence on other enabling machines and devices: Secondary | ICD-10-CM

## 2014-11-25 MED ORDER — ROPINIROLE HCL 0.5 MG PO TABS: 0.5000 mg | ORAL_TABLET | Freq: Every day | ORAL | Status: DC

## 2014-11-25 NOTE — Progress Notes (Signed)
[facility]  PATIENT: LATISIA HILAIRE DOB: 1940/09/01   REASON FOR VISIT: Followup for restless leg syndrome and obstructive sleep apnea    HISTORY OF PRESENT ILLNESS: Ms. Oravec, 74 year old female returns for followup.  She was last seen in this office in 11/24/2012- CM -  the patient suffers from restless leg syndrome but currently takes ropinirole which has reduced his symptoms to some mild severity. She has a history of restless leg syndrome and and is currently on Requip 0.5 daily which has been very beneficial.  She used the restless leg syndrome rating scale that is associated with the [facility] restless leg syndrome quality of life questionnaire. She states that she has trouble concentrating in the afternoon most of the time concentrating in the evening and that this is due to having restless leg sometimes before she takes her medication at bedtime. She can confirm that she has an irresistible urge to move if she would not take her medication.   Her restless legs have been distressing to her. Reactive depression scale was endorsed at 6 points, she confirmed that she feels clinically depressed. Her sleepiness score was endorsed at 4 points only and the fatigue severity score is endorsed at 48 points-  Elevated. Mrs. Gayton sister was murdered in September of last year, her motor was apprehended and arrested yesterday. She is very emotional about this understandably.   She has a history of sleep apnea which was diagnosed during PSG 08/15/2011. Her download shows a high compliance.   She currently uses a nasal pillow and humidity. She also has insomnia at baseline and uses Sonata when necessary, which is rarely, she had no refill in the last 11 month.    She  claims she uses her CPAP every night, she brought her machine in today for download. Used to machine 26 out of 30 days over 4 hours at night her residual AHI is 4.7 the air leak is high to moderately high. The  set pressure is  9 cm water with 2 cm EPR.       REVIEW OF SYSTEMS: Full 14 system review of systems performed and notable only for those listed, all others are neg:  Constitutional: Fatigue  Cardiovascular: Murmur  Ear/Nose/Throat: Hearing loss  Gastroitestinal: Urinary frequency  Musculoskeletal: Back pain Allergy/Immunology: Food allergies Sleep : Restless legs, sleep apnea   ALLERGIES: Allergies  Allergen Reactions  . Codeine     Upset stomach    . Iohexol Hives     Desc: Pt. states she broke out in hives 30 yrs ago w/ a CT Brain scan.  She has since had a heart cath and this CT today (08/10/05) w/o premeds and did well. No evident reaction.  Thanks.   . Shellfish Allergy Itching    Past history of itching with shrimp, has a history of allergy shots , no problem with seafood in years per patient.    HOME MEDICATIONS: Outpatient Prescriptions Prior to Visit  Medication Sig Dispense Refill  . acetaminophen (TYLENOL) 500 MG tablet Take 500-1,000 mg by mouth every 6 (six) hours as needed for headache (pain).    . carvedilol (COREG) 25 MG tablet TAKE 1 TABLET (25 MG TOTAL) BY MOUTH 2 (TWO) TIMES DAILY WITH A MEAL. 180 tablet 1  . cetirizine (ZYRTEC) 10 MG tablet Take 10 mg by mouth daily.    . citalopram (CELEXA) 40 MG tablet Take 40 mg by mouth as needed (depression). 1 tab po qd/ pt not taking  regularly     . losartan (COZAAR) 50 MG tablet Take 50 mg by mouth daily.    Marland Kitchen rOPINIRole (REQUIP) 0.5 MG tablet Take 1 tablet (0.5 mg total) by mouth at bedtime. 90 tablet 3  . triamcinolone cream (KENALOG) 0.1 % Apply 0.1 application topically 2 (two) times daily. APPLY TO AFFECTED AREA TWICE DAILY AS NEEDED. DO NOT APPLY TO FACE, GROIN,UNDERARMS.  2  . valACYclovir (VALTREX) 500 MG tablet TAKE 1 TABLET BY MOUTH TWICE A DAY FOR 5 DAYS 10 tablet 2  . zaleplon (SONATA) 10 MG capsule Take 10 mg by mouth at bedtime as needed for sleep.     No facility-administered medications prior to  visit.    PAST MEDICAL HISTORY: Past Medical History  Diagnosis Date  . Conjunctivitis   . Febrile neutropenia   . Anemia   . Hypokalemia   . Mucositis   . LBBB (left bundle branch block)   . Nonischemic cardiomyopathy     EF 30 to 35%  . S/P cardiac cath 2000    Normal coronaries  . CHD (coronary heart disease)   . Renal artery aneurysm   . CHF (congestive heart failure)   . Cancer of colon 08/2005    STAGE 1 CHEMO AND RADIATION  . Sleep apnea     PAST SURGICAL HISTORY: Past Surgical History  Procedure Laterality Date  . Appendectomy    . Cesarean section    . Cholecystectomy    . Tonsillectomy    . Port-a-cath removal    . US echocardiography  3/12    EF 30 to 35%  . Hemorroidectomy    . Tubal ligation    . Cataract surgery removal      bilateral    FAMILY HISTORY: Family History  Problem Relation Age of Onset  . Diabetes Sister   . Cancer Brother     PROSTATE    SOCIAL HISTORY: History   Social History  . Marital Status: Divorced    Spouse Name: N/A  . Number of Children: 2  . Years of Education: 12   Occupational History  .    Marland [employer] Retired    Social History Main Topics  . Smoking status: Never Smoker   . Smokeless tobacco: Never Used  . Alcohol Use: No  . Drug Use: No  . Sexual Activity: No   Other Topics Concern  . Not on file   Social History Narrative   Patient lives at home alone.    Patient has 2 children.    Patient is retired.    Patient has a high school education.    Patient is divorced.    Patient is right handed.      PHYSICAL EXAM  Filed Vitals:   11/25/14 1337  BP: 122/60  Pulse: 76  Resp: 20  Height: 5' 4.96" (1.65 m)  Weight: 188 lb (85.276 kg)   Body mass index is 31.32 kg/(m^2).  Generalized: Well developed, in no acute distress  Head: normocephalic and atraumatic,. Oropharynx benign   Neurological examination   Mentation: Alert oriented to time, place, history taking. Follows all commands speech and  language fluent. ESS =9. International restless leg rating scale 20.   Cranial nerve : Pupils were equal round reactive to light extraocular movements were full, visual field were full on confrontational test. She has endpoint nystagmus.   Facial sensation and strength were normal. hearing decreased on the right  Uvula tongue midline. head turning and shoulder shrug were normal  and symmetric.Tongue protrusion into cheek strength was normal. Motor: normal bulk and tone, full strength in the BUE, BLE,  No focal weakness.  Mild essential tremor bilaterally Sensory: normal and symmetric to light touch, pinprick, and  vibration  Coordination: finger-nose-finger, heel-to-shin bilaterally, no dysmetria Reflexes:2/2,  Brisk , not with clonus. plantar responses were flexor bilaterally. Gait and Station: Rising up from seated position without assistance, normal stance, moderate stride, good arm swing, smooth turning, able to perform tiptoe, and heel walking without difficulty. Tandem gait is steady. No assistive device  DIAGNOSTIC DATA (LABS, IMAGING, TESTING) - I reviewed patient records, labs, notes, testing and imaging myself where available.  Lab Results  Component Value Date   WBC 5.9 09/02/2014   HGB 12.2 09/02/2014   HCT 37.1 09/02/2014   MCV 96.1 09/02/2014   PLT 228 09/02/2014    Lab Results  Component Value Date   CHOL 185 03/10/2013   HDL 57.30 03/10/2013   LDLCALC 110* 03/10/2013   TRIG 91.0 03/10/2013   CHOLHDL 3 03/10/2013       ASSESSMENT AND PLAN  74 y.o. year old female  has a past medical history of obstructive sleep apnea diagnosed in February 2013 currently using nasal pillow.   Download to be performed today.   Restless leg syndrome controlled on Requip. ropinorol makes her sleepy.   Irresistible urge to move - If not taking dopamine or requip.  No associated neuropathy.no spinal stenosis known.   Sonata when necessary for insomnia. Has not used it in 6 month.     Continue Requip at current dose, will order ferritin, Iron and CBC for possible enrollment in RLS study.  Download to be done in the sleep lab- brought the machine. [employer] is DME.  Followup yearly, next visit with Dr. [facility]uc[facility] [address] [place], [place] [address] [phone]

## 2014-11-26 LAB — CBC WITH DIFFERENTIAL/PLATELET
Basophils Absolute: 0 10*3/uL (ref 0.0–0.2)
Basos: 1 %
EOS (ABSOLUTE): 0.3 10*3/uL (ref 0.0–0.4)
Eos: 6 %
Hematocrit: 36.2 % (ref 34.0–46.6)
Hemoglobin: 11.7 g/dL (ref 11.1–15.9)
Immature Grans (Abs): 0 10*3/uL (ref 0.0–0.1)
Immature Granulocytes: 0 %
Lymphocytes Absolute: 2.2 10*3/uL (ref 0.7–3.1)
Lymphs: 36 %
MCH: 31 pg (ref 26.6–33.0)
MCHC: 32.3 g/dL (ref 31.5–35.7)
MCV: 96 fL (ref 79–97)
Monocytes Absolute: 0.7 10*3/uL (ref 0.1–0.9)
Monocytes: 12 %
Neutrophils Absolute: 2.8 10*3/uL (ref 1.4–7.0)
Neutrophils: 45 %
Platelets: 232 10*3/uL (ref 150–379)
RBC: 3.78 x10E6/uL (ref 3.77–5.28)
RDW: 13.3 % (ref 12.3–15.4)
WBC: 6.2 10*3/uL (ref 3.4–10.8)

## 2014-11-26 LAB — COMPREHENSIVE METABOLIC PANEL
ALT: 17 IU/L (ref 0–32)
AST: 15 IU/L (ref 0–40)
Albumin/Globulin Ratio: 2 (ref 1.1–2.5)
Albumin: 4.5 g/dL (ref 3.5–4.8)
Alkaline Phosphatase: 71 IU/L (ref 39–117)
BUN/Creatinine Ratio: 20 (ref 11–26)
BUN: 19 mg/dL (ref 8–27)
Bilirubin Total: 0.3 mg/dL (ref 0.0–1.2)
CO2: 23 mmol/L (ref 18–29)
Calcium: 9.9 mg/dL (ref 8.7–10.3)
Chloride: 103 mmol/L (ref 97–108)
Creatinine, Ser: 0.95 mg/dL (ref 0.57–1.00)
GFR calc Af Amer: 69 mL/min/{1.73_m2} (ref >59)
GFR calc non Af Amer: 60 mL/min/{1.73_m2} (ref >59)
Globulin, Total: 2.3 g/dL (ref 1.5–4.5)
Glucose: 101 mg/dL — ABNORMAL HIGH (ref 65–99)
Potassium: 5.7 mmol/L — ABNORMAL HIGH (ref 3.5–5.2)
Sodium: 143 mmol/L (ref 134–144)
Total Protein: 6.8 g/dL (ref 6.0–8.5)

## 2014-11-26 LAB — IRON AND TIBC
Iron Saturation: 28 % (ref 15–55)
Iron: 77 ug/dL (ref 27–139)
Total Iron Binding Capacity: 275 ug/dL (ref 250–450)
UIBC: 198 ug/dL (ref 118–369)

## 2014-11-26 LAB — FERRITIN: Ferritin: 267 ng/mL — ABNORMAL HIGH (ref 15–150)

## 2014-12-02 ENCOUNTER — Other Ambulatory Visit: Payer: Self-pay | Admitting: Gynecology

## 2014-12-02 DIAGNOSIS — L820 Inflamed seborrheic keratosis: Secondary | ICD-10-CM | POA: Diagnosis not present

## 2014-12-06 ENCOUNTER — Telehealth: Payer: Self-pay

## 2014-12-06 NOTE — Telephone Encounter (Signed)
Called pt to relay lab results. Told her that her ferritin was high, which is a sign of inflammation. She requested that these lab results be faxed to Dr. Osborne Casco.

## 2014-12-06 NOTE — Telephone Encounter (Signed)
-----   Message from Larey Seat, MD sent at 12/06/2014  4:38 PM EDT ----- Elevated ferritin, indicating an inflammation

## 2014-12-09 DIAGNOSIS — C210 Malignant neoplasm of anus, unspecified: Secondary | ICD-10-CM | POA: Diagnosis not present

## 2014-12-13 ENCOUNTER — Other Ambulatory Visit: Payer: Self-pay

## 2014-12-13 ENCOUNTER — Other Ambulatory Visit: Payer: Self-pay | Admitting: Cardiovascular Disease

## 2014-12-13 DIAGNOSIS — Z1231 Encounter for screening mammogram for malignant neoplasm of breast: Secondary | ICD-10-CM | POA: Diagnosis not present

## 2014-12-15 ENCOUNTER — Encounter: Payer: Self-pay | Admitting: Gynecology

## 2014-12-25 ENCOUNTER — Other Ambulatory Visit: Payer: Self-pay | Admitting: Cardiovascular Disease

## 2014-12-30 ENCOUNTER — Encounter: Payer: Self-pay | Admitting: Neurology

## 2014-12-30 ENCOUNTER — Ambulatory Visit: Payer: Medicare Other | Admitting: Adult Health

## 2014-12-30 ENCOUNTER — Ambulatory Visit (INDEPENDENT_AMBULATORY_CARE_PROVIDER_SITE_OTHER): Payer: Medicare Other | Admitting: Neurology

## 2014-12-30 VITALS — BP 102/60 | HR 76 | Resp 20 | Ht 64.96 in | Wt 186.0 lb

## 2014-12-30 DIAGNOSIS — G2581 Restless legs syndrome: Secondary | ICD-10-CM

## 2014-12-30 NOTE — Patient Instructions (Signed)
Restless Legs Syndrome Restless legs syndrome is a movement disorder. It may also be called a sensorimotor disorder.  CAUSES  No one knows what specifically causes restless legs syndrome, but it tends to run in families. It is also more common in people with low iron, in pregnancy, in people who need dialysis, and those with nerve damage (neuropathy).Some medications may make restless legs syndrome worse.Those medications include drugs to treat high blood pressure, some heart conditions, nausea, colds, allergies, and depression. SYMPTOMS Symptoms include uncomfortable sensations in the legs. These leg sensations are worse during periods of inactivity or rest. They are also worse while sitting or lying down. Individuals that have the disorder describe sensations in the legs that feel like:  Pulling.  Drawing.  Crawling.  Worming.  Boring.  Tingling.  Pins and needles.  Prickling.  Pain. The sensations are usually accompanied by an overwhelming urge to move the legs. Sudden muscle jerks may also occur. Movement provides temporary relief from the discomfort. In rare cases, the arms may also be affected. Symptoms may interfere with going to sleep (sleep onset insomnia). Restless legs syndrome may also be related to periodic limb movement disorder (PLMD). PLMD is another more common motor disorder. It also causes interrupted sleep. The symptoms from PLMD usually occur most often when you are awake. TREATMENT  Treatment for restless legs syndrome is symptomatic. This means that the symptoms are treated.   Massage and cold compresses may provide temporary relief.  Walk, stretch, or take a cold or hot bath.  Get regular exercise and a good night's sleep.  Avoid caffeine, alcohol, nicotine, and medications that can make it worse.  Do activities that provide mental stimulation like discussions, needlework, and video games. These may be helpful if you are not able to walk or stretch. Some  medications are effective in relieving the symptoms. However, many of these medications have side effects. Ask your caregiver about medications that may help your symptoms. Correcting iron deficiency may improve symptoms for some patients. Document Released: 05/25/2002 Document Revised: 10/19/2013 Document Reviewed: 08/31/2010 Exit[employer] Patient Information 2015 [employer]. This information is not intended to replace advice given to you by your health care provider. Make sure you discuss any questions you have with your health care provider.  

## 2014-12-30 NOTE — Progress Notes (Signed)
[facility]  PATIENT: Samantha [name] DOB: 23-Jul-1940   REASON FOR VISIT: Followup for restless leg syndrome and obstructive sleep apnea    HISTORY OF PRESENT ILLNESS: Samantha [name], 74 year old female returns for followup.  She was last seen in this office in 11/24/2012- CM -  the patient suffers from restless leg syndrome but currently takes ropinirole which has reduced his symptoms to some mild severity. She has a history of restless leg syndrome and and is currently on Requip 0.5 daily which has been very beneficial.  She used the restless leg syndrome rating scale that is associated with the [facility] restless leg syndrome quality of life questionnaire. She states that she has trouble concentrating in the afternoon most of the time concentrating in the evening and that this is due to having restless leg sometimes before she takes her medication at bedtime. She can confirm that she has an irresistible urge to move if she would not take her medication.   Her restless legs have been distressing to her. Reactive depression scale was endorsed at 6 points, she confirmed that she feels clinically depressed. Her sleepiness score was endorsed at 4 points only and the fatigue severity score is endorsed at 48 points-  Elevated. Samantha [name] sister was murdered in September of last year, her motor was apprehended and arrested yesterday. She is very emotional about this understandably.   She has a history of sleep apnea which was diagnosed during PSG 08/15/2011. Her download shows a high compliance.   She currently uses a nasal pillow and humidity. She also has insomnia at baseline and uses Sonata when necessary, which is rarely, she had no refill in the last 11 month. She  claims she uses her CPAP every night, she brought her machine in today for download. Used to machine 26 out of 30 days over 4 hours at night her residual AHI is 4.7 the air leak is high to moderately high. The set  pressure is  9 cm water with 2 cm EPR.  Interval history from 12-30-14. Samantha [name] is here today after her visit 5 weeks ago to see if he can enroll her into the restless leg study.  She takes Requip 0.5 milligrams at nighttime or at evening time and actually her nocturnal sleep is no longer affected by restless legs - but she continues to be restless and continuously moving throughout the day she could not take a nap because of the restless legs in daytime.  He could also not tolerate the Requip in daytime is causing her to be sleepy. The questionnaire for Restless legs : IRLS at 19 points.      REVIEW OF SYSTEMS: Full 14 system review of systems performed and notable only for those listed, all others are neg:  Constitutional: Fatigue  Cardiovascular: Murmur  Ear/Nose/Throat: Hearing loss  Gastroitestinal: Urinary frequency  Musculoskeletal: Back pain Allergy/Immunology: Food allergies Sleep : Restless legs, sleep apnea   ALLERGIES: Allergies  Allergen Reactions  . Codeine     Upset stomach    . Iohexol Hives     Desc: Pt. states she broke out in hives 30 yrs ago w/ a CT Brain scan.  She has since had a heart cath and this CT today (08/10/05) w/o premeds and did well. No evident reaction.  Thanks.   . Shellfish Allergy Itching    Past history of itching with shrimp, has a history of allergy shots , no problem with seafood in years per patient.  HOME MEDICATIONS: Outpatient Prescriptions Prior to Visit  Medication Sig Dispense Refill  . acetaminophen (TYLENOL) 500 MG tablet Take 500-1,000 mg by mouth every 6 (six) hours as needed for headache (pain).    . carvedilol (COREG) 25 MG tablet TAKE 1 TABLET (25 MG TOTAL) BY MOUTH 2 (TWO) TIMES DAILY WITH A MEAL. 180 tablet 1  . cetirizine (ZYRTEC) 10 MG tablet Take 10 mg by mouth daily.    . citalopram (CELEXA) 40 MG tablet Take 40 mg by mouth as needed (depression). 1 tab po qd/ pt not taking regularly     . losartan (COZAAR) 50 MG  tablet Take 50 mg by mouth daily.    Marland Kitchen losartan (COZAAR) 50 MG tablet TAKE 1 TABLET (50 MG TOTAL) BY MOUTH DAILY. 90 tablet 3  . rOPINIRole (REQUIP) 0.5 MG tablet Take 1 tablet (0.5 mg total) by mouth at bedtime. 90 tablet 3  . triamcinolone cream (KENALOG) 0.1 % Apply 0.1 application topically 2 (two) times daily. APPLY TO AFFECTED AREA TWICE DAILY AS NEEDED. DO NOT APPLY TO FACE, GROIN,UNDERARMS.  2  . valACYclovir (VALTREX) 500 MG tablet TAKE 1 TABLET BY MOUTH TWICE A DAY FOR 5 DAYS 10 tablet 2  . zaleplon (SONATA) 10 MG capsule Take 10 mg by mouth at bedtime as needed for sleep.     No facility-administered medications prior to visit.    PAST MEDICAL HISTORY: Past Medical History  Diagnosis Date  . Conjunctivitis   . Febrile neutropenia   . Anemia   . Hypokalemia   . Mucositis   . LBBB (left bundle branch block)   . Nonischemic cardiomyopathy     EF 30 to 35%  . S/P cardiac cath 2000    Normal coronaries  . CHD (coronary heart disease)   . Renal artery aneurysm   . CHF (congestive heart failure)   . Cancer of colon 08/2005    STAGE 1 CHEMO AND RADIATION  . Sleep apnea   . Movement disorder     PAST SURGICAL HISTORY: Past Surgical History  Procedure Laterality Date  . Appendectomy    . Cesarean section    . Cholecystectomy    . Tonsillectomy    . Port-a-cath removal    . US echocardiography  3/12    EF 30 to 35%  . Hemorroidectomy    . Tubal ligation    . Cataract surgery removal      bilateral    FAMILY HISTORY: Family History  Problem Relation Age of Onset  . Diabetes Sister   . Cancer Brother     PROSTATE    SOCIAL HISTORY: History   Social History  . Marital Status: Divorced    Spouse Name: N/A  . Number of Children: 2  . Years of Education: 12   Occupational History  .    [employer] Retired    Social History Main Topics  . Smoking status: Never Smoker   . Smokeless tobacco: Never Used  . Alcohol Use: No  . Drug Use: No  . Sexual Activity: No    Other Topics Concern  . Not on file   Social History Narrative   Patient lives at home alone.    Patient has 2 children.    Patient is retired.    Patient has a high school education.    Patient is divorced.    Patient is right handed.      PHYSICAL EXAM  Filed Vitals:   12/30/14 1259  BP: 102/60  Pulse: 76  Resp: 20  Height: 5' 4.96" (1.65 m)  Weight: 186 lb (84.369 kg)   Body mass index is 30.99 kg/(m^2).  Generalized: Well developed, in no acute distress  Head: normocephalic and atraumatic,. Oropharynx benign   Neurological examination   Mentation: Alert oriented to time, place, history taking. Follows all commands speech and language fluent. ESS =9. International restless leg rating scale 20.   Cranial nerve : Pupils were equal round reactive to light extraocular movements were full, visual field were full on confrontational test. She has endpoint nystagmus.   Facial sensation and strength were normal. hearing decreased on the right  Uvula tongue midline. head turning and shoulder shrug were normal and symmetric.Tongue protrusion into cheek strength was normal. Motor: normal bulk and tone, full strength in the BUE, BLE,  No focal weakness.  Mild essential tremor bilaterally Sensory: normal and symmetric to light touch, pinprick, and  vibration  Coordination: finger-nose-finger, heel-to-shin bilaterally, no dysmetria Reflexes:2/2,  Brisk , not with clonus. plantar responses were flexor bilaterally. Gait and Station: Rising up from seated position without assistance, normal stance, moderate stride, good arm swing, smooth turning, able to perform tiptoe, and heel walking without difficulty. Tandem gait is steady. No assistive device  DIAGNOSTIC DATA (LABS, IMAGING, TESTING) - I reviewed patient records, labs, notes, testing and imaging myself where available.  I reviewed CMET, CBC, ferritin, total iron-binding capacity and free iron.   Lab Results  Component Value  Date   WBC 6.2 11/25/2014   HGB 12.2 09/02/2014   HCT 36.2 11/25/2014   MCV 96.1 09/02/2014   PLT 228 09/02/2014   74 y.o. year old female  has a past medical history of obstructive sleep apnea diagnosed in February 2013 currently using nasal pillow.    Restless leg syndrome controlled on Requip. Ropinorol makes her sleepy.   Irresistible urge to move - If not taking dopamine or requip. No associated neuropathy.no spinal stenosis known.  Enrollment in [place] study. [facility] [address], [place], [place] [address] [phone]

## 2015-01-25 ENCOUNTER — Encounter: Payer: Self-pay | Admitting: Neurology

## 2015-01-25 NOTE — Progress Notes (Signed)
Subjective:    Patient ID: Samantha [name] is a 74 y.o. female.  HPI  Review of Systems  Objective:  Neurologic Exam  Physical Exam  Assessment:   01-25-15 research revisit for restless leg study, status post IV and infusion 1.  Meet  today to obtain the see GC I guidelines and to document the taper of medication  Plan:   [facility]

## 2015-02-24 DIAGNOSIS — K921 Melena: Secondary | ICD-10-CM | POA: Diagnosis not present

## 2015-02-24 DIAGNOSIS — R109 Unspecified abdominal pain: Secondary | ICD-10-CM | POA: Diagnosis not present

## 2015-02-24 DIAGNOSIS — Z6829 Body mass index (BMI) 29.0-29.9, adult: Secondary | ICD-10-CM | POA: Diagnosis not present

## 2015-02-24 DIAGNOSIS — R310 Gross hematuria: Secondary | ICD-10-CM | POA: Diagnosis not present

## 2015-02-25 DIAGNOSIS — K921 Melena: Secondary | ICD-10-CM | POA: Diagnosis not present

## 2015-02-25 DIAGNOSIS — Z9049 Acquired absence of other specified parts of digestive tract: Secondary | ICD-10-CM | POA: Diagnosis not present

## 2015-02-25 DIAGNOSIS — K6389 Other specified diseases of intestine: Secondary | ICD-10-CM | POA: Diagnosis not present

## 2015-02-25 DIAGNOSIS — I722 Aneurysm of renal artery: Secondary | ICD-10-CM | POA: Diagnosis not present

## 2015-03-10 ENCOUNTER — Ambulatory Visit (INDEPENDENT_AMBULATORY_CARE_PROVIDER_SITE_OTHER): Payer: Medicare Other | Admitting: Gynecology

## 2015-03-10 ENCOUNTER — Encounter: Payer: Self-pay | Admitting: Gynecology

## 2015-03-10 VITALS — BP 130/80 | Ht 65.0 in | Wt 180.0 lb

## 2015-03-10 DIAGNOSIS — N952 Postmenopausal atrophic vaginitis: Secondary | ICD-10-CM | POA: Diagnosis not present

## 2015-03-10 DIAGNOSIS — Z01419 Encounter for gynecological examination (general) (routine) without abnormal findings: Secondary | ICD-10-CM

## 2015-03-10 DIAGNOSIS — K627 Radiation proctitis: Secondary | ICD-10-CM | POA: Diagnosis not present

## 2015-03-10 DIAGNOSIS — K625 Hemorrhage of anus and rectum: Secondary | ICD-10-CM

## 2015-03-10 DIAGNOSIS — Z85048 Personal history of other malignant neoplasm of rectum, rectosigmoid junction, and anus: Secondary | ICD-10-CM | POA: Diagnosis not present

## 2015-03-10 DIAGNOSIS — R933 Abnormal findings on diagnostic imaging of other parts of digestive tract: Secondary | ICD-10-CM | POA: Diagnosis not present

## 2015-03-10 NOTE — Progress Notes (Signed)
Samantha [name] [birth year] [id number]        74 y.o.  G2P2002 for breast and pelvic exam. Several issues noted below.  Past medical history,surgical history, problem list, medications, allergies, family history and social history were all reviewed and documented as reviewed in the EPIC chart.  ROS:  Performed with pertinent positives and negatives included in the history, assessment and plan.   Additional significant findings :  none   Exam: Samantha [name] Vitals:   03/10/15 1432  BP: 130/80  Height: 5\' 5"  (1.651 m)  Weight: 180 lb (81.647 kg)   General appearance:  Normal affect, orientation and appearance. Skin: Grossly normal HEENT: Without gross lesions.  No cervical or supraclavicular adenopathy. Thyroid normal.  Lungs:  Clear without wheezing, rales or rhonchi Cardiac: RR, without RMG Abdominal:  Soft, nontender, without masses, guarding, rebound, organomegaly or hernia Breasts:  Examined lying and sitting without masses, retractions, discharge or axillary adenopathy. Pelvic:  Ext/BUS/vagina with atrophic changes  Cervix with atrophic changes  Uterus axial to anteverted, normal size, shape and contour, midline and mobile nontender   Adnexa  Without masses or tenderness    Anus and perineum  Normal   Rectovaginal  Normal sphincter tone without palpated masses or tenderness.    Assessment/Plan:  74 y.o. [id number] female for breast and pelvic exam.   1. Postmenopausal/atrophic genital changes. Without significant symptoms of hot flushes, night sweats, vaginal dryness or any vaginal bleeding. Continue to monitor and report any vaginal bleeding. 2. Pap smear 2015. No Pap smear done today. No history of significant abnormal Pap smears. Options to stop screening altogether or less per current screening intervals reviewed. Will readdress on annual basis. 3. Mammography 11/2014. Continue with annual mammography. SBE monthly reviewed. 4. DEXA 2014 at Dr. Loren Racer office.  Do not  have copies of these results. Follow up at his recommendations as he is monitoring her for her bones. 5. Colonoscopy. Patient having rectal bleeding and actually has an appointment with Dr. Collene Mares today. History of colon cancer past. She'll follow up with her for management. 6. Health maintenance. No routine lab work done as this is done at her primary physician's office. Follow up 1 year, sooner as needed.   Anastasio Auerbach MD, 2:58 PM 03/10/2015

## 2015-03-10 NOTE — Patient Instructions (Signed)
Follow up with Dr. Collene Mares in reference to the rectal bleeding.  You may obtain a copy of any labs that were done today by logging onto MyChart as outlined in the instructions provided with your AVS (after visit summary). The office will not call with normal lab results but certainly if there are any significant abnormalities then we will contact you.   Health Maintenance Adopting a healthy lifestyle and getting preventive care can go a long way to promote health and wellness. Talk with your health care provider about what schedule of regular examinations is right for you. This is a good chance for you to check in with your provider about disease prevention and staying healthy. In between checkups, there are plenty of things you can do on your own. Experts have done a lot of research about which lifestyle changes and preventive measures are most likely to keep you healthy. Ask your health care provider for more information. WEIGHT AND DIET  Eat a healthy diet  Be sure to include plenty of vegetables, fruits, low-fat dairy products, and lean protein.  Do not eat a lot of foods high in solid fats, added sugars, or salt.  Get regular exercise. This is one of the most important things you can do for your health.  Most adults should exercise for at least 150 minutes each week. The exercise should increase your heart rate and make you sweat (moderate-intensity exercise).  Most adults should also do strengthening exercises at least twice a week. This is in addition to the moderate-intensity exercise.  Maintain a healthy weight  Body mass index (BMI) is a measurement that can be used to identify possible weight problems. It estimates body fat based on height and weight. Your health care provider can help determine your BMI and help you achieve or maintain a healthy weight.  For females 76 years of age and older:   A BMI below 18.5 is considered underweight.  A BMI of 18.5 to 24.9 is normal.  A  BMI of 25 to 29.9 is considered overweight.  A BMI of 30 and above is considered obese.  Watch levels of cholesterol and blood lipids  You should start having your blood tested for lipids and cholesterol at 73 years of age, then have this test every 5 years.  You may need to have your cholesterol levels checked more often if:  Your lipid or cholesterol levels are high.  You are older than 73 years of age.  You are at high risk for heart disease.  CANCER SCREENING   Lung Cancer  Lung cancer screening is recommended for adults 23-20 years old who are at high risk for lung cancer because of a history of smoking.  A yearly low-dose CT scan of the lungs is recommended for people who:  Currently smoke.  Have quit within the past 15 years.  Have at least a 30-pack-year history of smoking. A pack year is smoking an average of one pack of cigarettes a day for 1 year.  Yearly screening should continue until it has been 15 years since you quit.  Yearly screening should stop if you develop a health problem that would prevent you from having lung cancer treatment.  Breast Cancer  Practice breast self-awareness. This means understanding how your breasts normally appear and feel.  It also means doing regular breast self-exams. Let your health care provider know about any changes, no matter how small.  If you are in your 20s or 30s, you should have  CBE) by a health care provider every 1-3 years as part of a regular health exam.  If you are 40 or older, have a CBE every year. Also consider having a breast X-ray (mammogram) every year.  If you have a family history of breast cancer, talk to your health care provider about genetic screening.  If you are at high risk for breast cancer, talk to your health care provider about having an MRI and a mammogram every year.  Breast cancer gene (BRCA) assessment is recommended for women who have family members with BRCA-related  cancers. BRCA-related cancers include:  Breast.  Ovarian.  Tubal.  Peritoneal cancers.  Results of the assessment will determine the need for genetic counseling and BRCA1 and BRCA2 testing. Cervical Cancer Routine pelvic examinations to screen for cervical cancer are no longer recommended for nonpregnant women who are considered low risk for cancer of the pelvic organs (ovaries, uterus, and vagina) and who do not have symptoms. A pelvic examination may be necessary if you have symptoms including those associated with pelvic infections. Ask your health care provider if a screening pelvic exam is right for you.   The Pap test is the screening test for cervical cancer for women who are considered at risk.  If you had a hysterectomy for a problem that was not cancer or a condition that could lead to cancer, then you no longer need Pap tests.  If you are older than 65 years, and you have had normal Pap tests for the past 10 years, you no longer need to have Pap tests.  If you have had past treatment for cervical cancer or a condition that could lead to cancer, you need Pap tests and screening for cancer for at least 20 years after your treatment.  If you no longer get a Pap test, assess your risk factors if they change (such as having a new sexual partner). This can affect whether you should start being screened again.  Some women have medical problems that increase their chance of getting cervical cancer. If this is the case for you, your health care provider may recommend more frequent screening and Pap tests.  The human papillomavirus (HPV) test is another test that may be used for cervical cancer screening. The HPV test looks for the virus that can cause cell changes in the cervix. The cells collected during the Pap test can be tested for HPV.  The HPV test can be used to screen women 30 years of age and older. Getting tested for HPV can extend the interval between normal Pap tests from  three to five years.  An HPV test also should be used to screen women of any age who have unclear Pap test results.  After 73 years of age, women should have HPV testing as often as Pap tests.  Colorectal Cancer  This type of cancer can be detected and often prevented.  Routine colorectal cancer screening usually begins at 73 years of age and continues through 73 years of age.  Your health care provider may recommend screening at an earlier age if you have risk factors for colon cancer.  Your health care provider may also recommend using home test kits to check for hidden blood in the stool.  A small camera at the end of a tube can be used to examine your colon directly (sigmoidoscopy or colonoscopy). This is done to check for the earliest forms of colorectal cancer.  Routine screening usually begins at age 50.    Direct examination of the colon should be repeated every 5-10 years through 73 years of age. However, you may need to be screened more often if early forms of precancerous polyps or small growths are found. Skin Cancer  Check your skin from head to toe regularly.  Tell your health care provider about any new moles or changes in moles, especially if there is a change in a mole's shape or color.  Also tell your health care provider if you have a mole that is larger than the size of a pencil eraser.  Always use sunscreen. Apply sunscreen liberally and repeatedly throughout the day.  Protect yourself by wearing long sleeves, pants, a wide-brimmed hat, and sunglasses whenever you are outside. HEART DISEASE, DIABETES, AND HIGH BLOOD PRESSURE   Have your blood pressure checked at least every 1-2 years. High blood pressure causes heart disease and increases the risk of stroke.  If you are between 55 years and 79 years old, ask your health care provider if you should take aspirin to prevent strokes.  Have regular diabetes screenings. This involves taking a blood Shahiem Bedwell to check  your fasting blood sugar level.  If you are at a normal weight and have a low risk for diabetes, have this test once every three years after 73 years of age.  If you are overweight and have a high risk for diabetes, consider being tested at a younger age or more often. PREVENTING INFECTION  Hepatitis B  If you have a higher risk for hepatitis B, you should be screened for this virus. You are considered at high risk for hepatitis B if:  You were born in a country where hepatitis B is common. Ask your health care provider which countries are considered high risk.  Your parents were born in a high-risk country, and you have not been immunized against hepatitis B (hepatitis B vaccine).  You have HIV or AIDS.  You use needles to inject street drugs.  You live with someone who has hepatitis B.  You have had sex with someone who has hepatitis B.  You get hemodialysis treatment.  You take certain medicines for conditions, including cancer, organ transplantation, and autoimmune conditions. Hepatitis C  Blood testing is recommended for:  Everyone born from 1945 through 1965.  Anyone with known risk factors for hepatitis C. Sexually transmitted infections (STIs)  You should be screened for sexually transmitted infections (STIs) including gonorrhea and chlamydia if:  You are sexually active and are younger than 73 years of age.  You are older than 73 years of age and your health care provider tells you that you are at risk for this type of infection.  Your sexual activity has changed since you were last screened and you are at an increased risk for chlamydia or gonorrhea. Ask your health care provider if you are at risk.  If you do not have HIV, but are at risk, it may be recommended that you take a prescription medicine daily to prevent HIV infection. This is called pre-exposure prophylaxis (PrEP). You are considered at risk if:  You are sexually active and do not regularly use  condoms or know the HIV status of your partner(s).  You take drugs by injection.  You are sexually active with a partner who has HIV. Talk with your health care provider about whether you are at high risk of being infected with HIV. If you choose to begin PrEP, you should first be tested for HIV. You should then be   tested every 3 months for as long as you are taking PrEP.  PREGNANCY   If you are premenopausal and you may become pregnant, ask your health care provider about preconception counseling.  If you may become pregnant, take 400 to 800 micrograms (mcg) of folic acid every day.  If you want to prevent pregnancy, talk to your health care provider about birth control (contraception). OSTEOPOROSIS AND MENOPAUSE   Osteoporosis is a disease in which the bones lose minerals and strength with aging. This can result in serious bone fractures. Your risk for osteoporosis can be identified using a bone density scan.  If you are 65 years of age or older, or if you are at risk for osteoporosis and fractures, ask your health care provider if you should be screened.  Ask your health care provider whether you should take a calcium or vitamin D supplement to lower your risk for osteoporosis.  Menopause may have certain physical symptoms and risks.  Hormone replacement therapy may reduce some of these symptoms and risks. Talk to your health care provider about whether hormone replacement therapy is right for you.  HOME CARE INSTRUCTIONS   Schedule regular health, dental, and eye exams.  Stay current with your immunizations.   Do not use any tobacco products including cigarettes, chewing tobacco, or electronic cigarettes.  If you are pregnant, do not drink alcohol.  If you are breastfeeding, limit how much and how often you drink alcohol.  Limit alcohol intake to no more than 1 drink per day for nonpregnant women. One drink equals 12 ounces of beer, 5 ounces of wine, or 1 ounces of hard  liquor.  Do not use street drugs.  Do not share needles.  Ask your health care provider for help if you need support or information about quitting drugs.  Tell your health care provider if you often feel depressed.  Tell your health care provider if you have ever been abused or do not feel safe at home. Document Released: 12/18/2010 Document Revised: 10/19/2013 Document Reviewed: 05/06/2013 [employer] Patient Information 2015 [employer]. This information is not intended to replace advice given to you by your health care provider. Make sure you discuss any questions you have with your health care provider.  

## 2015-03-15 DIAGNOSIS — H5213 Myopia, bilateral: Secondary | ICD-10-CM | POA: Diagnosis not present

## 2015-03-15 DIAGNOSIS — Z961 Presence of intraocular lens: Secondary | ICD-10-CM | POA: Diagnosis not present

## 2015-03-15 DIAGNOSIS — H1851 Endothelial corneal dystrophy: Secondary | ICD-10-CM | POA: Diagnosis not present

## 2015-03-17 DIAGNOSIS — Z85048 Personal history of other malignant neoplasm of rectum, rectosigmoid junction, and anus: Secondary | ICD-10-CM | POA: Diagnosis not present

## 2015-03-17 DIAGNOSIS — Z86010 Personal history of colonic polyps: Secondary | ICD-10-CM | POA: Diagnosis not present

## 2015-03-17 DIAGNOSIS — K627 Radiation proctitis: Secondary | ICD-10-CM | POA: Diagnosis not present

## 2015-03-25 DIAGNOSIS — N3941 Urge incontinence: Secondary | ICD-10-CM | POA: Diagnosis not present

## 2015-03-25 DIAGNOSIS — R350 Frequency of micturition: Secondary | ICD-10-CM | POA: Diagnosis not present

## 2015-03-25 DIAGNOSIS — N3020 Other chronic cystitis without hematuria: Secondary | ICD-10-CM | POA: Diagnosis not present

## 2015-04-04 ENCOUNTER — Ambulatory Visit: Payer: Medicare Other | Admitting: Neurology

## 2015-04-16 ENCOUNTER — Emergency Department (HOSPITAL_COMMUNITY): Payer: Medicare Other

## 2015-04-16 ENCOUNTER — Encounter (HOSPITAL_COMMUNITY): Payer: Self-pay | Admitting: *Deleted

## 2015-04-16 ENCOUNTER — Emergency Department (HOSPITAL_COMMUNITY)
Admission: EM | Admit: 2015-04-16 | Discharge: 2015-04-16 | Disposition: A | Discharge status: Home / Self Care | Payer: Medicare Other | Attending: Emergency Medicine | Admitting: Emergency Medicine

## 2015-04-16 DIAGNOSIS — S52615A Nondisplaced fracture of left ulna styloid process, initial encounter for closed fracture: Secondary | ICD-10-CM | POA: Insufficient documentation

## 2015-04-16 DIAGNOSIS — I509 Heart failure, unspecified: Secondary | ICD-10-CM | POA: Insufficient documentation

## 2015-04-16 DIAGNOSIS — Y9389 Activity, other specified: Secondary | ICD-10-CM | POA: Diagnosis not present

## 2015-04-16 DIAGNOSIS — Z8669 Personal history of other diseases of the nervous system and sense organs: Secondary | ICD-10-CM | POA: Insufficient documentation

## 2015-04-16 DIAGNOSIS — Z862 Personal history of diseases of the blood and blood-forming organs and certain disorders involving the immune mechanism: Secondary | ICD-10-CM | POA: Diagnosis not present

## 2015-04-16 DIAGNOSIS — S5292XA Unspecified fracture of left forearm, initial encounter for closed fracture: Secondary | ICD-10-CM

## 2015-04-16 DIAGNOSIS — Y990 Civilian activity done for income or pay: Secondary | ICD-10-CM | POA: Diagnosis not present

## 2015-04-16 DIAGNOSIS — Z79899 Other long term (current) drug therapy: Secondary | ICD-10-CM | POA: Insufficient documentation

## 2015-04-16 DIAGNOSIS — S52612A Displaced fracture of left ulna styloid process, initial encounter for closed fracture: Secondary | ICD-10-CM

## 2015-04-16 DIAGNOSIS — Z8679 Personal history of other diseases of the circulatory system: Secondary | ICD-10-CM | POA: Diagnosis not present

## 2015-04-16 DIAGNOSIS — Z85038 Personal history of other malignant neoplasm of large intestine: Secondary | ICD-10-CM | POA: Insufficient documentation

## 2015-04-16 DIAGNOSIS — W1839XA Other fall on same level, initial encounter: Secondary | ICD-10-CM | POA: Insufficient documentation

## 2015-04-16 DIAGNOSIS — S6992XA Unspecified injury of left wrist, hand and finger(s), initial encounter: Secondary | ICD-10-CM | POA: Diagnosis present

## 2015-04-16 DIAGNOSIS — Y92096 Garden or yard of other non-institutional residence as the place of occurrence of the external cause: Secondary | ICD-10-CM | POA: Diagnosis not present

## 2015-04-16 DIAGNOSIS — S52502A Unspecified fracture of the lower end of left radius, initial encounter for closed fracture: Secondary | ICD-10-CM | POA: Insufficient documentation

## 2015-04-16 MED ORDER — OXYCODONE-ACETAMINOPHEN 5-325 MG PO TABS: 1.0000 | ORAL_TABLET | Freq: Four times a day (QID) | ORAL | Status: DC | PRN

## 2015-04-16 MED ORDER — HYDROCODONE-ACETAMINOPHEN 5-325 MG PO TABS
1.0000 | ORAL_TABLET | Freq: Once | ORAL | Status: AC
  Administered 2015-04-16: 1 via ORAL
  Filled 2015-04-16: qty 1

## 2015-04-16 NOTE — Discharge Instructions (Signed)
- Call Dr. Fredna Dow on Monday to schedule a follow up appointment   Wrist Fracture A wrist fracture is a break or crack in one of the bones of your wrist. Your wrist is made up of eight small bones at the palm of your hand (carpal bones) and two long bones that make up your forearm (radius and ulna). CAUSES  A direct blow to the wrist.  Falling on an outstretched hand.  Trauma, such as a car accident or a fall. RISK FACTORS Risk factors for wrist fracture include:  Participating in contact and high-risk sports, such as skiing, biking, and ice skating.  Taking steroid medicines.  Smoking.  Being female.  Being Caucasian.  Drinking more than three alcoholic beverages per day.  Having low or lowered bone density (osteoporosis or osteopenia).  Age. Older adults have decreased bone density.  Women who have had menopause.  History of previous fractures. SIGNS AND SYMPTOMS Symptoms of wrist fractures include tenderness, bruising, and inflammation. Additionally, the wrist may hang in an odd position or appear deformed. DIAGNOSIS Diagnosis may include:  Physical exam.  X-ray. TREATMENT Treatment depends on many factors, including the nature and location of the fracture, your age, and your activity level. Treatment for wrist fracture can be nonsurgical or surgical. Nonsurgical Treatment A plaster cast or splint may be applied to your wrist if the bone is in a good position. If the fracture is not in good position, it may be necessary for your health care provider to realign it before applying a splint or cast. Usually, a cast or splint will be worn for several weeks. Surgical Treatment Sometimes the position of the bone is so far out of place that surgery is required to apply a device to hold it together as it heals. Depending on the fracture, there are a number of options for holding the bone in place while it heals, such as a cast and metal pins. HOME CARE INSTRUCTIONS  Keep  your injured wrist elevated and move your fingers as much as possible.  Do not put pressure on any part of your cast or splint. It may break.  Use a plastic bag to protect your cast or splint from water while bathing or showering. Do not lower your cast or splint into water.  Take medicines only as directed by your health care provider.  Keep your cast or splint clean and dry. If it becomes wet, damaged, or suddenly feels too tight, contact your health care provider right away.  Do not use any tobacco products including cigarettes, chewing tobacco, or electronic cigarettes. Tobacco can delay bone healing. If you need help quitting, ask your health care provider.  Keep all follow-up visits as directed by your health care provider. This is important.  Ask your health care provider if you should take supplements of calcium and vitamins C and D to promote bone healing. SEEK MEDICAL CARE IF:  Your cast or splint is damaged, breaks, or gets wet.  You have a fever.  You have chills.  You have continued severe pain or more swelling than you did before the cast was put on. SEEK IMMEDIATE MEDICAL CARE IF:  Your hand or fingernails on the injured arm turn blue or gray, or feel cold or numb.  You have decreased feeling in the fingers of your injured arm. MAKE SURE YOU:  Understand these instructions.  Will watch your condition.  Will get help right away if you are not doing well or get worse.  This information is not intended to replace advice given to you by your health care provider. Make sure you discuss any questions you have with your health care provider.   Document Released: 03/14/2005 Document Revised: 02/23/2015 Document Reviewed: 06/22/2011 Elsevier Interactive Patient Education 2016 [place] or Splint Care Casts and splints support injured limbs and keep bones from moving while they heal. It is important to care for your cast or splint at home.  HOME CARE  INSTRUCTIONS  Keep the cast or splint uncovered during the drying period. It can take 24 to 48 hours to dry if it is made of plaster. A fiberglass cast will dry in less than 1 hour.  Do not rest the cast on anything harder than a pillow for the first 24 hours.  Do not put weight on your injured limb or apply pressure to the cast until your health care provider gives you permission.  Keep the cast or splint dry. Wet casts or splints can lose their shape and may not support the limb as well. A wet cast that has lost its shape can also create harmful pressure on your skin when it dries. Also, wet skin can become infected.  Cover the cast or splint with a plastic bag when bathing or when out in the rain or snow. If the cast is on the trunk of the body, take sponge baths until the cast is removed.  If your cast does become wet, dry it with a towel or a blow dryer on the cool setting only.  Keep your cast or splint clean. Soiled casts may be wiped with a moistened cloth.  Do not place any hard or soft foreign objects under your cast or splint, such as cotton, toilet paper, lotion, or powder.  Do not try to scratch the skin under the cast with any object. The object could get stuck inside the cast. Also, scratching could lead to an infection. If itching is a problem, use a blow dryer on a cool setting to relieve discomfort.  Do not trim or cut your cast or remove padding from inside of it.  Exercise all joints next to the injury that are not immobilized by the cast or splint. For example, if you have a long leg cast, exercise the hip joint and toes. If you have an arm cast or splint, exercise the shoulder, elbow, thumb, and fingers.  Elevate your injured arm or leg on 1 or 2 pillows for the first 1 to 3 days to decrease swelling and pain.It is best if you can comfortably elevate your cast so it is higher than your heart. SEEK MEDICAL CARE IF:   Your cast or splint cracks.  Your cast or splint  is too tight or too loose.  You have unbearable itching inside the cast.  Your cast becomes wet or develops a soft spot or area.  You have a bad smell coming from inside your cast.  You get an object stuck under your cast.  Your skin around the cast becomes red or raw.  You have new pain or worsening pain after the cast has been applied. SEEK IMMEDIATE MEDICAL CARE IF:   You have fluid leaking through the cast.  You are unable to move your fingers or toes.  You have discolored (blue or white), cool, painful, or very swollen fingers or toes beyond the cast.  You have tingling or numbness around the injured area.  You have severe pain or pressure under  the cast.  You have any difficulty with your breathing or have shortness of breath.  You have chest pain.   This information is not intended to replace advice given to you by your health care provider. Make sure you discuss any questions you have with your health care provider.   Document Released: 06/01/2000 Document Revised: 03/25/2013 Document Reviewed: 12/11/2012 Elsevier Interactive Patient Education [employer].

## 2015-04-16 NOTE — ED Notes (Signed)
Declined W/C at D/C and was escorted to lobby by RN. 

## 2015-04-16 NOTE — ED Notes (Signed)
Ortho page for splint.

## 2015-04-16 NOTE — ED Provider Notes (Signed)
CSN: [id number]     Arrival date & time 04/16/15  1432 History  By signing my name below, I, Stephania Fragmin, attest that this documentation has been prepared under the direction and in the presence of Chessie Neuharth, PA-C. Electronically Signed: Stephania Fragmin, ED Scribe. 04/16/2015. 2:56 PM.    Chief Complaint  Patient presents with  . Wrist Pain   The history is provided by the patient. No language interpreter was used.    HPI Comments:  Samantha [name] is a 74 y.o. female who presents to the Emergency Department complaining of sudden-onset, constant left wrist pain S/P a fall that occurred PTA. Patient states she was working in the yard when she lost her balance and fell down, catching herself with her hands. Reports immediate onset of severe, aching left wrist pain. Pain increases with movement of the wrist. She denies any other pain or injuries from the fall. Patient denies taking any OTC pain relievers. Holding the wrist still improves the pain. She denies any swelling, numbness, tingling, or loss of sensation in her fingertips. Denies wounds or color changes over the hand or fingers.    Past Medical History  Diagnosis Date  . Conjunctivitis   . Febrile neutropenia ([place])   . Anemia   . Hypokalemia   . Mucositis   . LBBB (left bundle branch block)   . Nonischemic cardiomyopathy (HCC)     EF 30 to 35%  . S/P cardiac cath 2000    Normal coronaries  . CHD (coronary heart disease)   . 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 CHF (congestive heart failure) ([place])   . Cancer of colon ([place]) 08/2005    STAGE 1 CHEMO AND RADIATION  . Sleep apnea   . Movement disorder   . Colitis    Past Surgical History  Procedure Laterality Date  . Appendectomy    . Cesarean section    . Cholecystectomy    . Tonsillectomy    . Port-a-cath removal    . US echocardiography  3/12    EF 30 to 35%  . Hemorroidectomy    . Tubal ligation    . Cataract surgery removal      bilateral  . Cardiac catheterization      Family History  Problem Relation Age of Onset  . Diabetes Sister   . Cancer Brother     PROSTATE   Social History  Substance Use Topics  . Smoking status: Never Smoker   . Smokeless tobacco: Never Used  . Alcohol Use: No   OB History    Gravida Para Term Preterm AB TAB SAB Ectopic Multiple Living   2 2 2       2      Review of Systems  Musculoskeletal: Positive for arthralgias (left wrist pain). Negative for joint swelling.  Skin: Negative for wound.  Neurological: Negative for weakness and numbness.   Allergies  Codeine; Iohexol; and Shellfish allergy  Home Medications   Prior to Admission medications   Medication Sig Start Date End Date Taking? Authorizing Provider  acetaminophen (TYLENOL) 500 MG tablet Take 500-1,000 mg by mouth every 6 (six) hours as needed for headache (pain).    Historical Provider, MD  carvedilol (COREG) 25 MG tablet TAKE 1 TABLET (25 MG TOTAL) BY MOUTH 2 (TWO) TIMES DAILY WITH A MEAL. 12/27/14   Thayer Headings, MD  cetirizine (ZYRTEC) 10 MG tablet Take 10 mg by mouth daily.    Historical Provider, MD  citalopram (CELEXA)  40 MG tablet Take 40 mg by mouth as needed (depression). 1 tab po qd/ pt not taking regularly  08/09/10   Historical Provider, MD  losartan (COZAAR) 50 MG tablet TAKE 1 TABLET (50 MG TOTAL) BY MOUTH DAILY. 12/13/14   Thayer Headings, MD  oxyCODONE-acetaminophen (PERCOCET/ROXICET) 5-325 MG tablet Take 1-2 tablets by mouth every 6 (six) hours as needed for severe pain. 04/16/15   Senovia Gauer, PA-C  triamcinolone cream (KENALOG) 0.1 % Apply 0.1 application topically 2 (two) times daily. APPLY TO AFFECTED AREA TWICE DAILY AS NEEDED. DO NOT APPLY TO FACE, GROIN,UNDERARMS. 07/20/14   Historical Provider, MD  valACYclovir (VALTREX) 500 MG tablet TAKE 1 TABLET BY MOUTH TWICE A DAY FOR 5 DAYS Patient not taking: Reported on 03/10/2015 12/02/14   Anastasio Auerbach, MD  zaleplon (SONATA) 10 MG capsule Take 10 mg by mouth at bedtime as needed for  sleep.    Historical Provider, MD   BP 129/61 mmHg  Pulse 99  Resp 20  SpO2 99% Physical Exam  Constitutional: She appears well-developed and well-nourished. No distress.  HENT:  Head: Normocephalic and atraumatic.  Right Ear: External ear normal.  Left Ear: External ear normal.  Eyes: Conjunctivae are normal. Right eye exhibits no discharge. Left eye exhibits no discharge. No scleral icterus.  Neck: Normal range of motion.  Cardiovascular: Normal rate and intact distal pulses.   Cap refill < 3  Pulmonary/Chest: Effort normal.  Musculoskeletal: Normal range of motion. She exhibits edema and tenderness.  Left wrist mildly edematous. No obvious deformity. Generalized tenderness over left wrist. FROM of wrist and fingers intact. Radial pulse palpable. Compartments soft.   Neurological: She is alert. Coordination normal.  4/5 grip strength due to pain. Sensation to light touch intact.   Skin: Skin is warm and dry.  No laceration, abrasion or ecchymosis over left wrist  Psychiatric: She has a normal mood and affect. Her behavior is normal.  Nursing note and vitals reviewed.   ED Course  Procedures (including critical care time)  DIAGNOSTIC STUDIES: Oxygen Saturation is 99% on RA, normal by my interpretation.    COORDINATION OF CARE: 2:53 PM - Discussed treatment plan with pt at bedside which includes XR of left wrist and pain-relieving medications. Pt verbalized understanding and agreed to plan.   Imaging Review Dg Wrist Complete Left  04/16/2015  CLINICAL DATA:  LEFT wrist injury after fall in yard today Initial encounter. EXAM: LEFT WRIST - COMPLETE 3+ VIEW COMPARISON:  None FINDINGS: There is a horizontal fracture through the LEFT radial metaphysis with impaction. The radiocarpal joint is intact. Nondisplaced fracture of the ulnar styloid. No carpal fracture. IMPRESSION: 1. Impaction fracture of distal LEFT radius. 2. Ulnar styloid fracture Electronically Signed   By: Suzy Bouchard M.D.   On: 04/16/2015 15:40   I have personally reviewed and evaluated these images and lab results as part of my medical decision-making.  MDM   Final diagnoses:  Radial fracture, left, closed, initial encounter  Fracture of ulnar styloid, left, closed, initial encounter   Pt presenting after mechanical fall with left wrist pain. No obvious deformity. No numbness, tingling, weakness or wounds of the hand. Hand is neurovascularly intact. Able to move wrist and fingers fully. Patient X-Ray with radial fracture and ulnar styloid fracture. Pain managed in ED with norco. Pt advised to follow up with hand orthopedics (Dr. Fredna Dow) for further evaluation and treatment.  Pain managed in the department. Patient given short prescription for percocet with  RICE therapy recommended and discussed. Patient will be dc home & is agreeable with above plan. I have also discussed reasons to return immediately to the ER.  Patient expresses understanding and agrees with plan.   I personally performed the services described in this documentation, which was scribed in my presence. The recorded information has been reviewed and is accurate.     Josephina Gip, PA-C 04/16/15 1623  Harvel Quale, MD 04/16/15 2253

## 2015-04-16 NOTE — Progress Notes (Signed)
Orthopedic Tech Progress Note Patient Details:  Samantha [name] June 04, 1941 [id number]  Ortho Devices Type of Ortho Device: Ace wrap, Volar splint Ortho Device/Splint Location: LUE Ortho Device/Splint Interventions: Application, Ordered   Braulio Bosch 04/16/2015, 4:16 PM

## 2015-04-16 NOTE — ED Notes (Signed)
PT reports falling in the yard today.

## 2015-04-18 ENCOUNTER — Encounter (HOSPITAL_COMMUNITY): Payer: Self-pay | Admitting: Emergency Medicine

## 2015-04-18 ENCOUNTER — Emergency Department (HOSPITAL_COMMUNITY)
Admission: EM | Admit: 2015-04-18 | Discharge: 2015-04-18 | Disposition: A | Discharge status: Home / Self Care | Payer: Medicare Other | Attending: Emergency Medicine | Admitting: Emergency Medicine

## 2015-04-18 DIAGNOSIS — I509 Heart failure, unspecified: Secondary | ICD-10-CM | POA: Insufficient documentation

## 2015-04-18 DIAGNOSIS — Z862 Personal history of diseases of the blood and blood-forming organs and certain disorders involving the immune mechanism: Secondary | ICD-10-CM | POA: Insufficient documentation

## 2015-04-18 DIAGNOSIS — W1839XD Other fall on same level, subsequent encounter: Secondary | ICD-10-CM | POA: Diagnosis not present

## 2015-04-18 DIAGNOSIS — Z8669 Personal history of other diseases of the nervous system and sense organs: Secondary | ICD-10-CM | POA: Insufficient documentation

## 2015-04-18 DIAGNOSIS — S52502D Unspecified fracture of the lower end of left radius, subsequent encounter for closed fracture with routine healing: Secondary | ICD-10-CM | POA: Diagnosis not present

## 2015-04-18 DIAGNOSIS — Z8639 Personal history of other endocrine, nutritional and metabolic disease: Secondary | ICD-10-CM | POA: Insufficient documentation

## 2015-04-18 DIAGNOSIS — I2510 Atherosclerotic heart disease of native coronary artery without angina pectoris: Secondary | ICD-10-CM | POA: Insufficient documentation

## 2015-04-18 DIAGNOSIS — M25532 Pain in left wrist: Secondary | ICD-10-CM | POA: Diagnosis present

## 2015-04-18 DIAGNOSIS — Z9889 Other specified postprocedural states: Secondary | ICD-10-CM | POA: Diagnosis not present

## 2015-04-18 DIAGNOSIS — Z85038 Personal history of other malignant neoplasm of large intestine: Secondary | ICD-10-CM | POA: Insufficient documentation

## 2015-04-18 DIAGNOSIS — Z79899 Other long term (current) drug therapy: Secondary | ICD-10-CM | POA: Insufficient documentation

## 2015-04-18 DIAGNOSIS — S5292XD Unspecified fracture of left forearm, subsequent encounter for closed fracture with routine healing: Secondary | ICD-10-CM | POA: Insufficient documentation

## 2015-04-18 MED ORDER — OXYCODONE-ACETAMINOPHEN 5-325 MG PO TABS: 2.0000 | ORAL_TABLET | ORAL | Status: DC | PRN

## 2015-04-18 NOTE — ED Provider Notes (Signed)
CSN: [id number]     Arrival date & time 04/18/15  1830 History  By signing my name below, I, Emmanuella Mensah, attest that this documentation has been prepared under the direction and in the presence of Lihanna Biever, PA-C. Electronically Signed: Judithann Sauger, ED Scribe. 04/18/2015. 7:37 PM.    Chief Complaint  Patient presents with  . Wrist Pain   The history is provided by the patient. No language interpreter was used.   HPI Comments: Samantha [name] is a 74 y.o. female who presents to the Emergency Department complaining of left wrist pain onset 04/16/15. Pt was seen here on 04/16/15 with a left wrist fracture and given pain medication (percocet). She reports that she has been compliant with the prescribed medication and they have offered her good relief for a few hours but then the pain returns. She states that she only has a few left in the bottle. She reports that the pain is exacerbated by wrist pronation. Denies new trauma to the wrist. Pt states that she has an appointment with Dr. Fredna Dow in 2 days for evaluation of her wrist fracture. She denies swelling in her fingers, inability to move her digits, loss of sensation in the hand, numbness in the hand, or any changes to the pain characteristics.   Past Medical History  Diagnosis Date  . Conjunctivitis   . Febrile neutropenia ([place])   . Anemia   . Hypokalemia   . Mucositis   . LBBB (left bundle branch block)   . Nonischemic cardiomyopathy (HCC)     EF 30 to 35%  . S/P cardiac cath 2000    Normal coronaries  . CHD (coronary heart disease)   . 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 CHF (congestive heart failure) ([place])   . Cancer of colon ([place]) 08/2005    STAGE 1 CHEMO AND RADIATION  . Sleep apnea   . Movement disorder   . Colitis    Past Surgical History  Procedure Laterality Date  . Appendectomy    . Cesarean section    . Cholecystectomy    . Tonsillectomy    . Port-a-cath removal    . US echocardiography  3/12    EF 30  to 35%  . Hemorroidectomy    . Tubal ligation    . Cataract surgery removal      bilateral  . Cardiac catheterization     Family History  Problem Relation Age of Onset  . Diabetes Sister   . Cancer Brother     PROSTATE   Social History  Substance Use Topics  . Smoking status: Never Smoker   . Smokeless tobacco: Never Used  . Alcohol Use: No   OB History    Gravida Para Term Preterm AB TAB SAB Ectopic Multiple Living   2 2 2       2      Review of Systems  Constitutional: Negative for fever.  Musculoskeletal: Positive for arthralgias. Negative for myalgias and joint swelling.  Neurological: Negative for weakness and numbness.      Allergies  Codeine; Iohexol; and Shellfish allergy  Home Medications   Prior to Admission medications   Medication Sig Start Date End Date Taking? Authorizing Provider  acetaminophen (TYLENOL) 500 MG tablet Take 500-1,000 mg by mouth every 6 (six) hours as needed for headache (pain).    Historical Provider, MD  carvedilol (COREG) 25 MG tablet TAKE 1 TABLET (25 MG TOTAL) BY MOUTH 2 (TWO) TIMES DAILY WITH A MEAL. 12/27/14  Thayer Headings, MD  cetirizine (ZYRTEC) 10 MG tablet Take 10 mg by mouth daily.    Historical Provider, MD  citalopram (CELEXA) 40 MG tablet Take 40 mg by mouth as needed (depression). 1 tab po qd/ pt not taking regularly  08/09/10   Historical Provider, MD  losartan (COZAAR) 50 MG tablet TAKE 1 TABLET (50 MG TOTAL) BY MOUTH DAILY. 12/13/14   Thayer Headings, MD  oxyCODONE-acetaminophen (PERCOCET/ROXICET) 5-325 MG tablet Take 1-2 tablets by mouth every 6 (six) hours as needed for severe pain. 04/16/15   Breanna Mcdaniel, PA-C  oxyCODONE-acetaminophen (PERCOCET/ROXICET) 5-325 MG tablet Take 2 tablets by mouth every 4 (four) hours as needed for severe pain. 04/18/15   Jaxston Chohan, PA-C  triamcinolone cream (KENALOG) 0.1 % Apply 0.1 application topically 2 (two) times daily. APPLY TO AFFECTED AREA TWICE DAILY AS NEEDED. DO NOT APPLY  TO FACE, GROIN,UNDERARMS. 07/20/14   Historical Provider, MD  valACYclovir (VALTREX) 500 MG tablet TAKE 1 TABLET BY MOUTH TWICE A DAY FOR 5 DAYS Patient not taking: Reported on 03/10/2015 12/02/14   Anastasio Auerbach, MD  zaleplon (SONATA) 10 MG capsule Take 10 mg by mouth at bedtime as needed for sleep.    Historical Provider, MD   BP 134/59 mmHg  Pulse 78  Temp(Src) 98.2 F (36.8 C) (Oral)  Resp 16  SpO2 97% Physical Exam  Constitutional: She appears well-developed and well-nourished. No distress.  HENT:  Head: Normocephalic and atraumatic.  Right Ear: External ear normal.  Left Ear: External ear normal.  Eyes: Conjunctivae are normal. Right eye exhibits no discharge. Left eye exhibits no discharge. No scleral icterus.  Neck: Normal range of motion.  Cardiovascular: Normal rate.   Cap refill < 3  Pulmonary/Chest: Effort normal.  Musculoskeletal: Normal range of motion.  Pt in soft arm cast. No swelling of digits, palm or upper arm. Full ROM of fingers intact. No tenderness over fingers or palm.  Neurological: She is alert. Coordination normal.  4/5 grip strength due to cast/pain. Sensation to light touch intact.   Skin: Skin is warm and dry.  Ecchymosis over dorsal aspect of hand. No color change over the digits  Psychiatric: She has a normal mood and affect. Her behavior is normal.  Nursing note and vitals reviewed.   ED Course  Procedures (including critical care time) DIAGNOSTIC STUDIES: Oxygen Saturation is 97% on RA, normal by my interpretation.    COORDINATION OF CARE: 7:35 PM- Pt advised of plan for treatment and pt agrees. Pt will receive a sling to help keep her arm in place and 4 pills of her prescribed medication.     EKG Interpretation None      MDM   Final diagnoses:  Radial fracture, left, closed, with routine healing, subsequent encounter   Pt presenting with persistent wrist pain after fracturing wrist 2 days ago. Pt was seen by this provider for  initial encounter. Case was discussed with Dr. Fredna Dow who recommended outpatient follow up and pain control. Pt prescribed #15 percocet and dc'ed in good condition. Pt returns with persistent pain of wrist. Pronating wrist exacerbates this pain. She reports good pain relief with percocet but states that it wears off after a few hours. She only has a few left. Denies change in pain or worsening of pain at wrist. Pt has full mobility of fingers and no swelling or color changes of the digits. No pain of fingers or palm. No sign of compartment syndrome. Will put pt in sling  to prevent pronation. Discussed with pt that I will provide #4 more percocet to get her to her appointment with Dr. Fredna Dow in 2 days. Pt is agreeable with this plan. Return precautions given in discharge paperwork and discussed with pt at bedside. Pt stable for discharge   I personally performed the services described in this documentation, which was scribed in my presence. The recorded information has been reviewed and is accurate.   Josephina Gip, PA-C 04/18/15 2146  Wandra Arthurs, MD 04/18/15 [phone]

## 2015-04-18 NOTE — ED Notes (Signed)
To ED with c/o left wrist pain from a fall on Saturday-- has soft cast on, able to move fingers, bruising to top of hand, good cap refill,

## 2015-04-18 NOTE — Discharge Instructions (Signed)
-   Keep your appointment with Dr. Fredna Dow on Wednesday

## 2015-04-20 DIAGNOSIS — S52532A Colles' fracture of left radius, initial encounter for closed fracture: Secondary | ICD-10-CM | POA: Diagnosis not present

## 2015-04-25 ENCOUNTER — Other Ambulatory Visit: Payer: Self-pay | Admitting: Orthopedic Surgery

## 2015-04-26 ENCOUNTER — Encounter: Payer: Self-pay | Admitting: Internal Medicine

## 2015-04-26 ENCOUNTER — Encounter: Payer: Self-pay | Admitting: Cardiovascular Disease

## 2015-04-26 ENCOUNTER — Ambulatory Visit (INDEPENDENT_AMBULATORY_CARE_PROVIDER_SITE_OTHER): Payer: Medicare Other | Admitting: Cardiovascular Disease

## 2015-04-26 VITALS — BP 102/60 | HR 76 | Ht 65.0 in | Wt 179.0 lb

## 2015-04-26 DIAGNOSIS — I5022 Chronic systolic (congestive) heart failure: Secondary | ICD-10-CM | POA: Diagnosis not present

## 2015-04-26 DIAGNOSIS — I10 Essential (primary) hypertension: Secondary | ICD-10-CM

## 2015-04-26 DIAGNOSIS — I447 Left bundle-branch block, unspecified: Secondary | ICD-10-CM

## 2015-04-26 DIAGNOSIS — I509 Heart failure, unspecified: Secondary | ICD-10-CM | POA: Diagnosis not present

## 2015-04-26 LAB — BASIC METABOLIC PANEL
BUN: 20 mg/dL (ref 7–25)
CO2: 25 mmol/L (ref 20–31)
Calcium: 9.8 mg/dL (ref 8.6–10.4)
Chloride: 103 mmol/L (ref 98–110)
Creat: 0.9 mg/dL (ref 0.60–0.93)
Glucose, Bld: 92 mg/dL (ref 65–99)
Potassium: 4 mmol/L (ref 3.5–5.3)
Sodium: 138 mmol/L (ref 135–146)

## 2015-04-26 NOTE — Progress Notes (Signed)
Cardiology Office Note   Date:  04/26/2015   ID:  Samantha [name], DOB Aug 04, 1940, MRN [id number]  PCP:  Haywood Pao, MD  Cardiologist:   Thayer Headings, MD   Chief Complaint  Patient presents with  . Follow-up    CHF   1. Congestive heart failure- original EF 30-35%, now EF = 45-50% 2. History colon cancer-status post radiation and chemotherapy-April 2007 3. Smooth and normal coronary arteries by heart catheterization in 2000 4. OSA - wears CPAP   History of Present Illness:  She has done well from a cardiac stand point. She has had some dizziness and found her BP to be low yesterday. She has occasional palpitations and frequently finds that her heart rate is about 100 with minimal exertion.  September 18, 2012:  Samantha [name] is doing well. Still has some dyspnea - especially when bending over or stooping over. No chest pain. She eats an ice cream sundae every day, sometimes twice a day. She still eats fast food almost all of the time. She had an EF of 35% several years ago. Her EF has improved to 45-50% with medical therapy.   Sept. 22, 2014:  Samantha [name] is doing well from a cardiac standpoint. She has been found to have a lung nodule.  She has occasional episodes of dyspnea - especially with exertion. She is still eating salty foods - she salts everything that she eats.   October 05, 2013:  Doing well from a cardiac standpoint. No CP, Does have some dyspnea. Has been out working out in the yard without problems.   Oct. 28, 2015:  Samantha [name] is doing well. She finds occasional elevated BP.  She still eats salt. Which likely explains her elevated BP   Oct 18, 2014:  Samantha [name] is a 74 y.o. female who presents for follow up of her CHF. She has floaters in her visual field this am Breathing is ok. Occasionally gets short of breath.    Nov. 8, 2016: Doing ok.   Has lost weight . Breathing is ok   Past Medical History  Diagnosis Date  . Conjunctivitis     . Febrile neutropenia ([place])   . Anemia   . Hypokalemia   . Mucositis   . LBBB (left bundle branch block)   . Nonischemic cardiomyopathy (HCC)     EF 30 to 35%  . S/P cardiac cath 2000    Normal coronaries  . CHD (coronary heart disease)   . Renal artery aneurysm ([place])   . CHF (congestive heart failure) ([place])   . Cancer of colon ([place]) 08/2005    STAGE 1 CHEMO AND RADIATION  . Movement disorder   . Colitis   . Sleep apnea     uses CPAP nightly    Past Surgical History  Procedure Laterality Date  . Appendectomy    . Cesarean section    . Cholecystectomy    . Tonsillectomy    . Port-a-cath removal    . US echocardiography  3/12    EF 30 to 35%  . Hemorroidectomy    . Tubal ligation    . Cataract surgery removal      bilateral  . Cardiac catheterization       Current Outpatient Prescriptions  Medication Sig Dispense Refill  . acetaminophen (TYLENOL) 500 MG tablet Take 500-1,000 mg by mouth every 6 (six) hours as needed for headache (pain).    . carvedilol (COREG) 25 MG tablet TAKE 1 TABLET (25 MG  TOTAL) BY MOUTH 2 (TWO) TIMES DAILY WITH A MEAL. 180 tablet 1  . cetirizine (ZYRTEC) 10 MG tablet Take 10 mg by mouth daily.    . citalopram (CELEXA) 40 MG tablet Take 40 mg by mouth as needed (depression). 1 tab po qd/ pt not taking regularly     . losartan (COZAAR) 50 MG tablet TAKE 1 TABLET (50 MG TOTAL) BY MOUTH DAILY. 90 tablet 3  . oxyCODONE-acetaminophen (PERCOCET/ROXICET) 5-325 MG tablet Take 2 tablets by mouth every 4 (four) hours as needed for severe pain. 4 tablet 0  . triamcinolone cream (KENALOG) 0.1 % Apply 0.1 application topically 2 (two) times daily. APPLY TO AFFECTED AREA TWICE DAILY AS NEEDED. DO NOT APPLY TO FACE, GROIN,UNDERARMS.  2   No current facility-administered medications for this visit.    Allergies:   Codeine; Iohexol; and Shellfish allergy    Social History:  The patient  reports that she has never smoked. She has never used smokeless tobacco.  She reports that she does not drink alcohol or use illicit drugs.   Family History:  The patient's family history includes Cancer in her brother; Diabetes in her sister.    ROS:  Please see the history of present illness.    Review of Systems: Constitutional:  denies fever, chills, diaphoresis, appetite change and fatigue.  HEENT: denies photophobia, eye pain, redness, hearing loss, ear pain, congestion, sore throat, rhinorrhea, sneezing, neck pain, neck stiffness and tinnitus.  Respiratory: denies SOB, DOE, cough, chest tightness, and wheezing.  Cardiovascular: denies chest pain, palpitations and leg swelling.  Gastrointestinal: denies nausea, vomiting, abdominal pain, diarrhea, constipation, blood in stool.  Genitourinary: denies dysuria, urgency, frequency, hematuria, flank pain and difficulty urinating.  Musculoskeletal: denies  myalgias, back pain, joint swelling, arthralgias and gait problem.   Skin: denies pallor, rash and wound.  Neurological: denies dizziness, seizures, syncope, weakness, light-headedness, numbness and headaches.   Hematological: denies adenopathy, easy bruising, personal or family bleeding history.  Psychiatric/ Behavioral: denies suicidal ideation, mood changes, confusion, nervousness, sleep disturbance and agitation.       All other systems are reviewed and negative.    PHYSICAL EXAM: VS:  BP 102/60 mmHg  Pulse 76  Ht 5\' 5"  (1.651 m)  Wt 179 lb (81.194 kg)  BMI 29.79 kg/m2  SpO2 97% , BMI Body mass index is 29.79 kg/(m^2). GEN: Well nourished, well developed, in no acute distress HEENT: normal Neck: no JVD, carotid bruits, or masses Cardiac: RRR; no murmurs, rubs, or gallops,no edema  Respiratory:  clear to auscultation bilaterally, normal work of breathing GI: soft, nontender, nondistended, + BS MS: no deformity or atrophy Skin: warm and dry, no rash Neuro:  Strength and sensation are intact Psych: normal   EKG:  EKG is not ordered  today. The ekg ordered today demonstrates    Recent Labs: 09/02/2014: B Natriuretic Peptide 77.2; Hemoglobin 12.2; Platelets 228 11/25/2014: ALT 17; BUN 19; Creatinine, Ser 0.95; Potassium 5.7*; Sodium 143    Lipid Panel    Component Value Date/Time   CHOL 185 03/10/2013 1142   TRIG 91.0 03/10/2013 1142   HDL 57.30 03/10/2013 1142   CHOLHDL 3 03/10/2013 1142   VLDL 18.2 03/10/2013 1142   LDLCALC 110* 03/10/2013 1142      Wt Readings from Last 3 Encounters:  04/26/15 179 lb (81.194 kg)  04/22/15 185 lb (83.915 kg)  03/10/15 180 lb (81.647 kg)      Other studies Reviewed: Additional studies/ records that were reviewed today include: .  Review of the above records demonstrates:    ASSESSMENT AND PLAN:  1. Congestive heart failure- original EF 30-35%, now EF = 45-50%,  She has not had any recent problems .  Continue same meds  2. History colon cancer-status post radiation and chemotherapy-April 2007  3. Smooth and normal coronary arteries by heart catheterization in 2000  4.  Left wrist fracture:  Needs to have surgery.  Planning on a regional block . She is at low risk for CV complication    Current medicines are reviewed at length with the patient today.  The patient does not have concerns regarding medicines.  The following changes have been made:  no change  Labs/ tests ordered today include:  No orders of the defined types were placed in this encounter.     Disposition:   FU with me in 6 months      Christiane Sistare, Wonda Cheng, MD  04/26/2015 3:13 PM    [facility] [place], [place], [place]  [address] Phone: [phone]; Fax: [phone]   [facility]  [address] [place], [place]  [address] [phone]    Fax [phone]

## 2015-04-26 NOTE — Patient Instructions (Signed)
Medication Instructions:  Your physician recommends that you continue on your current medications as directed. Please refer to the Current Medication list given to you today.   Labwork: TODAY - basic metabolic panel   Testing/Procedures: None Ordered   Follow-Up: Your physician wants you to follow-up in: 6 months with Dr. Nahser. You will receive a reminder letter in the mail two months in advance. If you don't receive a letter, please call our office to schedule the follow-up appointment.   If you need a refill on your cardiac medications before your next appointment, please call your pharmacy.   Thank you for choosing [facility]! Michelle Swinyer, RN [phone]    

## 2015-04-28 ENCOUNTER — Ambulatory Visit (HOSPITAL_BASED_OUTPATIENT_CLINIC_OR_DEPARTMENT_OTHER): Payer: Medicare Other | Admitting: Anesthesiology

## 2015-04-28 ENCOUNTER — Encounter (HOSPITAL_BASED_OUTPATIENT_CLINIC_OR_DEPARTMENT_OTHER)
Admission: RE | Disposition: A | Discharge status: Home / Self Care | Payer: Self-pay | Source: Ambulatory Visit | Attending: Orthopedic Surgery

## 2015-04-28 ENCOUNTER — Encounter (HOSPITAL_BASED_OUTPATIENT_CLINIC_OR_DEPARTMENT_OTHER): Payer: Self-pay | Admitting: Anesthesiology

## 2015-04-28 ENCOUNTER — Ambulatory Visit (HOSPITAL_BASED_OUTPATIENT_CLINIC_OR_DEPARTMENT_OTHER)
Admission: RE | Admit: 2015-04-28 | Discharge: 2015-04-28 | Disposition: A | Discharge status: Home / Self Care | Payer: Medicare Other | Source: Ambulatory Visit | Attending: Orthopedic Surgery | Admitting: Orthopedic Surgery

## 2015-04-28 DIAGNOSIS — Z91013 Allergy to seafood: Secondary | ICD-10-CM | POA: Insufficient documentation

## 2015-04-28 DIAGNOSIS — Z888 Allergy status to other drugs, medicaments and biological substances status: Secondary | ICD-10-CM | POA: Insufficient documentation

## 2015-04-28 DIAGNOSIS — I10 Essential (primary) hypertension: Secondary | ICD-10-CM | POA: Diagnosis not present

## 2015-04-28 DIAGNOSIS — Y9389 Activity, other specified: Secondary | ICD-10-CM | POA: Insufficient documentation

## 2015-04-28 DIAGNOSIS — I2510 Atherosclerotic heart disease of native coronary artery without angina pectoris: Secondary | ICD-10-CM | POA: Insufficient documentation

## 2015-04-28 DIAGNOSIS — W19XXXA Unspecified fall, initial encounter: Secondary | ICD-10-CM | POA: Diagnosis not present

## 2015-04-28 DIAGNOSIS — Y9289 Other specified places as the place of occurrence of the external cause: Secondary | ICD-10-CM | POA: Insufficient documentation

## 2015-04-28 DIAGNOSIS — G259 Extrapyramidal and movement disorder, unspecified: Secondary | ICD-10-CM | POA: Insufficient documentation

## 2015-04-28 DIAGNOSIS — Y998 Other external cause status: Secondary | ICD-10-CM | POA: Diagnosis not present

## 2015-04-28 DIAGNOSIS — I447 Left bundle-branch block, unspecified: Secondary | ICD-10-CM | POA: Insufficient documentation

## 2015-04-28 DIAGNOSIS — I739 Peripheral vascular disease, unspecified: Secondary | ICD-10-CM | POA: Diagnosis not present

## 2015-04-28 DIAGNOSIS — S52532A Colles' fracture of left radius, initial encounter for closed fracture: Secondary | ICD-10-CM | POA: Diagnosis not present

## 2015-04-28 DIAGNOSIS — E669 Obesity, unspecified: Secondary | ICD-10-CM | POA: Diagnosis not present

## 2015-04-28 DIAGNOSIS — I509 Heart failure, unspecified: Secondary | ICD-10-CM | POA: Diagnosis not present

## 2015-04-28 DIAGNOSIS — I110 Hypertensive heart disease with heart failure: Secondary | ICD-10-CM | POA: Diagnosis not present

## 2015-04-28 DIAGNOSIS — G4730 Sleep apnea, unspecified: Secondary | ICD-10-CM | POA: Insufficient documentation

## 2015-04-28 DIAGNOSIS — Z85038 Personal history of other malignant neoplasm of large intestine: Secondary | ICD-10-CM | POA: Insufficient documentation

## 2015-04-28 DIAGNOSIS — Z885 Allergy status to narcotic agent status: Secondary | ICD-10-CM | POA: Insufficient documentation

## 2015-04-28 DIAGNOSIS — S52572A Other intraarticular fracture of lower end of left radius, initial encounter for closed fracture: Secondary | ICD-10-CM | POA: Diagnosis not present

## 2015-04-28 DIAGNOSIS — S52502A Unspecified fracture of the lower end of left radius, initial encounter for closed fracture: Secondary | ICD-10-CM | POA: Diagnosis not present

## 2015-04-28 HISTORY — PX: OPEN REDUCTION INTERNAL FIXATION (ORIF) DISTAL RADIAL FRACTURE: SHX5989

## 2015-04-28 SURGERY — OPEN REDUCTION INTERNAL FIXATION (ORIF) DISTAL RADIUS FRACTURE
Anesthesia: Monitor Anesthesia Care | Site: Wrist | Laterality: Left

## 2015-04-28 MED ORDER — BUPIVACAINE-EPINEPHRINE (PF) 0.5% -1:200000 IJ SOLN
INTRAMUSCULAR | Status: DC | PRN
  Administered 2015-04-28: 25 mL via PERINEURAL

## 2015-04-28 MED ORDER — DEXAMETHASONE SODIUM PHOSPHATE 10 MG/ML IJ SOLN
INTRAMUSCULAR | Status: AC
  Filled 2015-04-28: qty 1

## 2015-04-28 MED ORDER — CHLORHEXIDINE GLUCONATE 4 % EX LIQD: 60.0000 mL | Freq: Once | CUTANEOUS | Status: DC

## 2015-04-28 MED ORDER — LIDOCAINE HCL (PF) 0.5 % IJ SOLN
INTRAMUSCULAR | Status: DC | PRN
  Administered 2015-04-28: 20 mL via INTRAVENOUS
  Administered 2015-04-28: 10 mL via INTRAVENOUS

## 2015-04-28 MED ORDER — PROPOFOL 10 MG/ML IV BOLUS
INTRAVENOUS | Status: DC | PRN
  Administered 2015-04-28 (×7): 20 mg via INTRAVENOUS

## 2015-04-28 MED ORDER — ONDANSETRON HCL 4 MG/2ML IJ SOLN
INTRAMUSCULAR | Status: AC
  Filled 2015-04-28: qty 2

## 2015-04-28 MED ORDER — FENTANYL CITRATE (PF) 100 MCG/2ML IJ SOLN
INTRAMUSCULAR | Status: DC | PRN
  Administered 2015-04-28: 25 ug via INTRAVENOUS
  Administered 2015-04-28: 50 ug via INTRAVENOUS
  Administered 2015-04-28: 25 ug via INTRAVENOUS

## 2015-04-28 MED ORDER — MIDAZOLAM HCL 2 MG/2ML IJ SOLN
1.0000 mg | INTRAMUSCULAR | Status: DC | PRN
  Administered 2015-04-28: 1 mg via INTRAVENOUS

## 2015-04-28 MED ORDER — LACTATED RINGERS IV SOLN
INTRAVENOUS | Status: DC
  Administered 2015-04-28 (×2): via INTRAVENOUS

## 2015-04-28 MED ORDER — OXYCODONE-ACETAMINOPHEN 5-325 MG PO TABS: ORAL_TABLET | ORAL | Status: DC

## 2015-04-28 MED ORDER — MIDAZOLAM HCL 2 MG/2ML IJ SOLN
INTRAMUSCULAR | Status: AC
  Filled 2015-04-28: qty 2

## 2015-04-28 MED ORDER — FENTANYL CITRATE (PF) 100 MCG/2ML IJ SOLN
INTRAMUSCULAR | Status: AC
  Filled 2015-04-28: qty 2

## 2015-04-28 MED ORDER — ONDANSETRON HCL 4 MG/2ML IJ SOLN
INTRAMUSCULAR | Status: DC | PRN
  Administered 2015-04-28: 4 mg via INTRAVENOUS

## 2015-04-28 MED ORDER — BUPIVACAINE HCL (PF) 0.25 % IJ SOLN
INTRAMUSCULAR | Status: AC
  Filled 2015-04-28: qty 90

## 2015-04-28 MED ORDER — LIDOCAINE HCL (PF) 1 % IJ SOLN
INTRAMUSCULAR | Status: AC
  Filled 2015-04-28: qty 90

## 2015-04-28 MED ORDER — CEFAZOLIN SODIUM-DEXTROSE 2-3 GM-% IV SOLR
2.0000 g | INTRAVENOUS | Status: AC
  Administered 2015-04-28: 2 g via INTRAVENOUS

## 2015-04-28 MED ORDER — CEFAZOLIN SODIUM-DEXTROSE 2-3 GM-% IV SOLR
INTRAVENOUS | Status: AC
  Filled 2015-04-28: qty 50

## 2015-04-28 MED ORDER — PROPOFOL 10 MG/ML IV BOLUS
INTRAVENOUS | Status: AC
  Filled 2015-04-28: qty 20

## 2015-04-28 MED ORDER — FENTANYL CITRATE (PF) 100 MCG/2ML IJ SOLN
INTRAMUSCULAR | Status: AC
  Filled 2015-04-28: qty 4

## 2015-04-28 MED ORDER — LIDOCAINE HCL (CARDIAC) 20 MG/ML IV SOLN
INTRAVENOUS | Status: AC
  Filled 2015-04-28: qty 5

## 2015-04-28 MED ORDER — SCOPOLAMINE 1 MG/3DAYS TD PT72
1.0000 | MEDICATED_PATCH | Freq: Once | TRANSDERMAL | Status: DC | PRN
Start: 2015-04-28 — End: 2015-04-28

## 2015-04-28 MED ORDER — GLYCOPYRROLATE 0.2 MG/ML IJ SOLN: 0.2000 mg | Freq: Once | INTRAMUSCULAR | Status: DC | PRN

## 2015-04-28 MED ORDER — FENTANYL CITRATE (PF) 100 MCG/2ML IJ SOLN
50.0000 ug | INTRAMUSCULAR | Status: DC | PRN
  Administered 2015-04-28: 50 ug via INTRAVENOUS

## 2015-04-28 SURGICAL SUPPLY — 69 items
BANDAGE ELASTIC 3 VELCRO ST LF (GAUZE/BANDAGES/DRESSINGS) ×2 IMPLANT
BIT DRILL 2.0 LNG QUCK RELEASE (BIT) IMPLANT
BIT DRILL 2.8X5 QR DISP (BIT) ×1 IMPLANT
BLADE SURG 15 STRL LF DISP TIS (BLADE) ×2 IMPLANT
BLADE SURG 15 STRL SS (BLADE) ×4
BNDG CMPR 9X4 STRL LF SNTH (GAUZE/BANDAGES/DRESSINGS) ×1
BNDG ESMARK 4X9 LF (GAUZE/BANDAGES/DRESSINGS) ×2 IMPLANT
BNDG GAUZE ELAST 4 BULKY (GAUZE/BANDAGES/DRESSINGS) ×2 IMPLANT
BNDG PLASTER X FAST 3X3 WHT LF (CAST SUPPLIES) IMPLANT
BNDG PLSTR 9X3 FST ST WHT (CAST SUPPLIES)
BONE CHIP PRESERV 5CC PCAN5 (Bone Implant) ×2 IMPLANT
CHLORAPREP W/TINT 26ML (MISCELLANEOUS) ×2 IMPLANT
CORDS BIPOLAR (ELECTRODE) ×2 IMPLANT
COVER BACK TABLE 60X90IN (DRAPES) ×2 IMPLANT
COVER MAYO STAND STRL (DRAPES) ×2 IMPLANT
DRAPE EXTREMITY T 121X128X90 (DRAPE) ×2 IMPLANT
DRAPE OEC MINIVIEW 54X84 (DRAPES) ×2 IMPLANT
DRAPE SURG 17X23 STRL (DRAPES) ×2 IMPLANT
DRILL 2.0 LNG QUICK RELEASE (BIT) ×2
GAUZE SPONGE 4X4 12PLY STRL (GAUZE/BANDAGES/DRESSINGS) ×2 IMPLANT
GAUZE XEROFORM 1X8 LF (GAUZE/BANDAGES/DRESSINGS) ×2 IMPLANT
GLOVE BIO SURGEON STRL SZ7.5 (GLOVE) ×2 IMPLANT
GLOVE BIOGEL PI IND STRL 8 (GLOVE) ×1 IMPLANT
GLOVE BIOGEL PI IND STRL 8.5 (GLOVE) IMPLANT
GLOVE BIOGEL PI INDICATOR 8 (GLOVE) ×1
GLOVE BIOGEL PI INDICATOR 8.5 (GLOVE)
GLOVE SURG ORTHO 8.0 STRL STRW (GLOVE) IMPLANT
GOWN STRL REUS W/ TWL LRG LVL3 (GOWN DISPOSABLE) ×1 IMPLANT
GOWN STRL REUS W/TWL LRG LVL3 (GOWN DISPOSABLE) ×2
GOWN STRL REUS W/TWL XL LVL3 (GOWN DISPOSABLE) ×2 IMPLANT
GRAFT BNE CANC CHIPS 1-8 5CC (Bone Implant) IMPLANT
GUIDEWIRE ORTHO 0.054X6 (WIRE) ×6 IMPLANT
NDL HYPO 25X1 1.5 SAFETY (NEEDLE) IMPLANT
NEEDLE HYPO 25X1 1.5 SAFETY (NEEDLE) IMPLANT
NS IRRIG 1000ML POUR BTL (IV SOLUTION) ×2 IMPLANT
PACK BASIN DAY SURGERY FS (CUSTOM PROCEDURE TRAY) ×2 IMPLANT
PAD CAST 3X4 CTTN HI CHSV (CAST SUPPLIES) ×1 IMPLANT
PADDING CAST ABS 4INX4YD NS (CAST SUPPLIES) ×1
PADDING CAST ABS COTTON 4X4 ST (CAST SUPPLIES) ×1 IMPLANT
PADDING CAST COTTON 3X4 STRL (CAST SUPPLIES) ×2
PLATE LEFT DIST RADIUS NARROW (Plate) ×2 IMPLANT
SCREW ACTK 2 NL HEX 3.5.11 (Screw) ×1 IMPLANT
SCREW CORT FT 18X2.3XLCK HEX (Screw) IMPLANT
SCREW CORT FT 20X2.3XLCK HEX (Screw) ×1 IMPLANT
SCREW CORTICAL LOCKING 2.3X16M (Screw) ×4 IMPLANT
SCREW CORTICAL LOCKING 2.3X18M (Screw) ×4 IMPLANT
SCREW CORTICAL LOCKING 2.3X20M (Screw) ×4 IMPLANT
SCREW FX16X2.3XLCK SMTH NS CRT (Screw) IMPLANT
SCREW FX18X2.3XSMTH LCK NS CRT (Screw) IMPLANT
SCREW FX20X2.3XSMTH LCK NS CRT (Screw) IMPLANT
SCREW NLCKG 13 3.5X13 HEXA (Screw) IMPLANT
SCREW NON LOCK 3.5X10MM (Screw) ×1 IMPLANT
SCREW NON-LOCK 3.5X13 (Screw) ×2 IMPLANT
SLEEVE SCD COMPRESS KNEE MED (MISCELLANEOUS) IMPLANT
STOCKINETTE 4X48 STRL (DRAPES) ×2 IMPLANT
SUCTION FRAZIER TIP 10 FR DISP (SUCTIONS) IMPLANT
SUT ETHILON 3 0 PS 1 (SUTURE) IMPLANT
SUT ETHILON 4 0 PS 2 18 (SUTURE) ×2 IMPLANT
SUT VIC AB 2-0 SH 27 (SUTURE)
SUT VIC AB 2-0 SH 27XBRD (SUTURE) IMPLANT
SUT VIC AB 3-0 PS1 18 (SUTURE)
SUT VIC AB 3-0 PS1 18XBRD (SUTURE) IMPLANT
SUT VICRYL 4-0 PS2 18IN ABS (SUTURE) ×2 IMPLANT
SYR BULB 3OZ (MISCELLANEOUS) ×2 IMPLANT
SYR CONTROL 10ML LL (SYRINGE) IMPLANT
TOWEL OR 17X24 6PK STRL BLUE (TOWEL DISPOSABLE) ×4 IMPLANT
TOWEL OR NON WOVEN STRL DISP B (DISPOSABLE) ×2 IMPLANT
TUBE CONNECTING 20X1/4 (TUBING) IMPLANT
UNDERPAD 30X30 (UNDERPADS AND DIAPERS) ×2 IMPLANT

## 2015-04-28 NOTE — Anesthesia Postprocedure Evaluation (Signed)
  Anesthesia Post-op Note  Patient: Samantha [name]  Procedure(s) Performed: Procedure(s): OPEN REDUCTION INTERNAL FIXATION (ORIF) LEFT DISTAL RADIUS FRACTURE (Left)  Patient Location: PACU  Anesthesia Type:GA combined with regional for post-op pain  Level of Consciousness: awake and alert   Airway and Oxygen Therapy: Patient Spontanous Breathing  Post-op Pain: none  Post-op Assessment: Post-op Vital signs reviewed              Post-op Vital Signs: stable  Last Vitals:  Filed Vitals:   04/28/15 1300  BP: 126/54  Pulse: 72  Temp:   Resp: 18    Complications: No apparent anesthesia complications

## 2015-04-28 NOTE — Anesthesia Preprocedure Evaluation (Signed)
Anesthesia Evaluation  Patient identified by MRN, date of birth, ID band  History of Anesthesia Complications Negative for: history of anesthetic complications  Airway Mallampati: II  TM Distance: <3 FB     Dental   Pulmonary sleep apnea ,    breath sounds clear to auscultation       Cardiovascular hypertension, + Peripheral Vascular Disease and +CHF  + dysrhythmias  Rhythm:Regular Rate:Normal     Neuro/Psych    GI/Hepatic   Endo/Other    Renal/GU      Musculoskeletal   Abdominal (+) + obese,   Peds  Hematology   Anesthesia Other Findings   Reproductive/Obstetrics                             Anesthesia Physical Anesthesia Plan  ASA: III  Anesthesia Plan: MAC and Regional   Post-op Pain Management: MAC Combined w/ Regional for Post-op pain   Induction:   Airway Management Planned: Natural Airway and Simple Face Mask  Additional Equipment:   Intra-op Plan:   Post-operative Plan:   Informed Consent:   Dental advisory given  Plan Discussed with:   Anesthesia Plan Comments:         Anesthesia Quick Evaluation

## 2015-04-28 NOTE — Discharge Instructions (Addendum)

## 2015-04-28 NOTE — H&P (Signed)
Samantha [name] is an 74 y.o. female.   Chief Complaint: left distal radius fracture HPI: 74 yo rhd female states she fell 04/16/15 injuring left wrist.  Seen at ED where XR revealed distal radius fracture.  Splinted and followed up in office.  She reports no previous injury to wrist and no other injury at this time.  Past Medical History  Diagnosis Date  . Conjunctivitis   . Febrile neutropenia ([place])   . Anemia   . Hypokalemia   . Mucositis   . LBBB (left bundle branch block)   . Nonischemic cardiomyopathy (HCC)     EF 30 to 35%  . S/P cardiac cath 2000    Normal coronaries  . CHD (coronary heart disease)   . 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 CHF (congestive heart failure) ([place])   . Cancer of colon ([place]) 08/2005    STAGE 1 CHEMO AND RADIATION  . Movement disorder   . Colitis   . Sleep apnea     uses CPAP nightly    Past Surgical History  Procedure Laterality Date  . Appendectomy    . Cesarean section    . Cholecystectomy    . Tonsillectomy    . Port-a-cath removal    . US echocardiography  3/12    EF 30 to 35%  . Hemorroidectomy    . Tubal ligation    . Cataract surgery removal      bilateral  . Cardiac catheterization      Family History  Problem Relation Age of Onset  . Diabetes Sister   . Cancer Brother     PROSTATE   Social History:  reports that she has never smoked. She has never used smokeless tobacco. She reports that she does not drink alcohol or use illicit drugs.  Allergies:  Allergies  Allergen Reactions  . Codeine     Upset stomach    . Iohexol Hives     Desc: Pt. states she broke out in hives 30 yrs ago w/ a CT Brain scan.  She has since had a heart cath and this CT today (08/10/05) w/o premeds and did well. No evident reaction.  Thanks.   . Shellfish Allergy Itching    Past history of itching with shrimp, has a history of allergy shots , no problem with seafood in years per patient.    Medications Prior to Admission  Medication Sig  Dispense Refill  . acetaminophen (TYLENOL) 500 MG tablet Take 500-1,000 mg by mouth every 6 (six) hours as needed for headache (pain).    . carvedilol (COREG) 25 MG tablet TAKE 1 TABLET (25 MG TOTAL) BY MOUTH 2 (TWO) TIMES DAILY WITH A MEAL. 180 tablet 1  . cetirizine (ZYRTEC) 10 MG tablet Take 10 mg by mouth daily.    . citalopram (CELEXA) 40 MG tablet Take 40 mg by mouth as needed (depression). 1 tab po qd/ pt not taking regularly     . losartan (COZAAR) 50 MG tablet TAKE 1 TABLET (50 MG TOTAL) BY MOUTH DAILY. 90 tablet 3  . oxyCODONE-acetaminophen (PERCOCET/ROXICET) 5-325 MG tablet Take 2 tablets by mouth every 4 (four) hours as needed for severe pain. 4 tablet 0  . triamcinolone cream (KENALOG) 0.1 % Apply 0.1 application topically 2 (two) times daily. APPLY TO AFFECTED AREA TWICE DAILY AS NEEDED. DO NOT APPLY TO FACE, GROIN,UNDERARMS.  2    Results for orders placed or performed in visit on 04/26/15 (from the past 48 hour(s))  Basic Metabolic Panel (BMET)     Status: None   Collection Time: 04/26/15  3:33 PM  Result Value Ref Range   Sodium 138 135 - 146 mmol/L   Potassium 4.0 3.5 - 5.3 mmol/L   Chloride 103 98 - 110 mmol/L   CO2 25 20 - 31 mmol/L   Glucose, Bld 92 65 - 99 mg/dL   BUN 20 7 - 25 mg/dL   Creat 0.90 0.60 - 0.93 mg/dL   Calcium 9.8 8.6 - 10.4 mg/dL    No results found.   A comprehensive review of systems was negative except for: Eyes: positive for cataracts and contacts/glasses Musculoskeletal: positive for arthralgias and back pain Behavioral/Psych: positive for sleep disturbance  Blood pressure 148/62, pulse 66, temperature 98.5 F (36.9 C), resp. rate 18, height 5\' 5"  (1.651 m), weight 79.946 kg (176 lb 4 oz), SpO2 96 %.  General appearance: alert, cooperative and appears stated age Head: Normocephalic, without obvious abnormality, atraumatic Neck: supple, symmetrical, trachea midline Resp: clear to auscultation bilaterally Cardio: regular rate and  rhythm GI: non tender Extremities: intact sensation and capillary refill all digits.  +epl/fpl/io.  no wounds. Pulses: 2+ and symmetric Skin: Skin color, texture, turgor normal. No rashes or lesions Neurologic: Grossly normal Incision/Wound: none  Assessment/Plan Left distal radius fracture.  Non operative and operative treatment options were discussed with the patient and patient wishes to proceed with operative treatment. Risks, benefits, and alternatives of surgery were discussed and the patient agrees with the plan of care.   Samantha [name] R 04/28/2015, 10:47 AM

## 2015-04-28 NOTE — Brief Op Note (Signed)
04/28/2015  12:20 PM  PATIENT:  Samantha [name]  74 y.o. female  PRE-OPERATIVE DIAGNOSIS:  LEFT DISTAL RADIUS FRACTURE  POST-OPERATIVE DIAGNOSIS:  LEFT DISTAL RADIUS FRACTURE  PROCEDURE:  Procedure(s): OPEN REDUCTION INTERNAL FIXATION (ORIF) LEFT DISTAL RADIUS FRACTURE (Left)  SURGEON:  Surgeon(s) and Role:    * Leanora Cover, MD - Primary  PHYSICIAN ASSISTANT:   ASSISTANTS: Leverne Humbles, PA   ANESTHESIA:   regional and IV sedation  EBL:  Total I/O In: 1000 [I.V.:1000] Out: -   BLOOD ADMINISTERED:none  DRAINS: none   LOCAL MEDICATIONS USED:  NONE  SPECIMEN:  No Specimen  DISPOSITION OF SPECIMEN:  N/A  COUNTS:  YES  TOURNIQUET:   Total Tourniquet Time Documented: Upper Arm (Left) - 61 minutes Total: Upper Arm (Left) - 61 minutes   DICTATION: .Other Dictation: Dictation Number [id number]  PLAN OF CARE: Discharge to home after PACU  PATIENT DISPOSITION:  PACU - hemodynamically stable.

## 2015-04-28 NOTE — Anesthesia Procedure Notes (Addendum)
Anesthesia Regional Block:  Supraclavicular block  Pre-Anesthetic Checklist: ,, timeout performed, Correct Patient, Correct Site, Correct Laterality, Correct Procedure, Correct Position, site marked, Risks and benefits discussed,  Surgical consent,  Pre-op evaluation,  At surgeon's request and post-op pain management  Laterality: Left and Lower  Prep: chloraprep and alcohol swabs       Needles:      Needle Length: 9cm 9 cm Needle Gauge: 22 and 22 G  Needle insertion depth: 4 cm   Additional Needles:  Procedures: ultrasound guided (picture in chart) and nerve stimulator Supraclavicular block Narrative:  Start time: 04/28/2015 10:00 AM End time: 04/28/2015 10:15 AM Injection made incrementally with aspirations every 5 mL.  Performed by: Personally  Anesthesiologist: MASSAGEE, TERRY  Additional Notes: Tolerated well   Procedure Name: MAC Date/Time: 04/28/2015 10:52 AM Performed by: Marrianne Mood Pre-anesthesia Checklist: Patient identified, Emergency Drugs available, Suction available, Patient being monitored and Timeout performed Patient Re-evaluated:Patient Re-evaluated prior to inductionOxygen Delivery Method: Simple face mask

## 2015-04-28 NOTE — Progress Notes (Signed)
Assisted Dr. Massagee with left, ultrasound guided, interscalene  block. Side rails up, monitors on throughout procedure. See vital signs in flow sheet. Tolerated Procedure well. 

## 2015-04-28 NOTE — Op Note (Signed)
[id number] 

## 2015-04-28 NOTE — Op Note (Signed)
Intra-operative fluoroscopic images in the AP, lateral, and oblique views were taken and evaluated by myself.  Reduction and hardware placement were confirmed.  There was no intraarticular penetration of permanent hardware.  

## 2015-04-28 NOTE — Transfer of Care (Signed)
Immediate Anesthesia Transfer of Care Note  Patient: Samantha [name]  Procedure(s) Performed: Procedure(s): OPEN REDUCTION INTERNAL FIXATION (ORIF) LEFT DISTAL RADIUS FRACTURE (Left)  Patient Location: PACU  Anesthesia Type:MAC and MAC combined with regional for post-op pain  Level of Consciousness: awake and patient cooperative  Airway & Oxygen Therapy: Patient Spontanous Breathing and Patient connected to face mask oxygen  Post-op Assessment: Report given to RN and Post -op Vital signs reviewed and stable  Post vital signs: Reviewed and stable  Last Vitals:  Filed Vitals:   04/28/15 1020  BP:   Pulse: 66  Temp:   Resp: 18    Complications: No apparent anesthesia complications

## 2015-04-29 ENCOUNTER — Encounter (HOSPITAL_BASED_OUTPATIENT_CLINIC_OR_DEPARTMENT_OTHER): Payer: Self-pay | Admitting: Orthopedic Surgery

## 2015-04-29 NOTE — Op Note (Signed)
Samantha [name], Samantha [name] NO.:  [id number]  MEDICAL RECORD NO.:  [id number]  LOCATION:                               FACILITY:  [facility]  PHYSICIAN:  Leanora Cover, MD        DATE OF BIRTH:  Oct 23, 1940  DATE OF PROCEDURE:  04/28/2015 DATE OF DISCHARGE:  04/28/2015                              OPERATIVE REPORT   PREOPERATIVE DIAGNOSIS:  Left distal radius fracture.  POSTOPERATIVE DIAGNOSIS:  Left distal radius fracture.  PROCEDURE:   1. Open reduction and internal fixation of left distal radius intraarticular fracture 2. Left brachioradialis release.  SURGEON:  Leanora Cover, MD  ASSISTANT:  Marily Lente. Dasnoit, PA-C.  ANESTHESIA:  Regional with sedation.  IV FLUIDS:  Per anesthesia flow sheet.  ESTIMATED BLOOD LOSS:  Minimal.  COMPLICATIONS:  None.  SPECIMENS:  None.  TOURNIQUET TIME:  61 minutes.  DISPOSITION:  Stable to PACU.  INDICATIONS:  Samantha [name] is a 74 year old female who fell approximately a week and a half ago injuring her left wrist.  She was seen at the emergency department where radiographs were taken revealing distal wrist fracture.  She will follow up in the office.  We discussed nonoperative and operative treatment options.  She wished to proceed with operative treatment.  Risks, benefits and alternatives of the surgery were discussed including the risk of blood loss; infection; damage to nerves, vessels, tendons, ligaments, bone; failure of surgery; need for additional surgery; complications with wound healing; continued pain; nonunion; malunion; stiffness.  She voiced understanding of these risks and elected to proceed.  We also discussed possible need for bone graft, and risks of transmission of HIV or hepatitis are low.  She agreed.  OPERATIVE COURSE:  After being identified preoperatively by myself, the patient and I agreed upon the procedure and site of procedure.  Surgical site was marked.  The risks, benefits and alternatives of the  surgery were reviewed and she wished to proceed.  Surgical consent had been signed.  She was given IV Ancef as preoperative antibiotic prophylaxis. A regional block was performed by Anesthesia in the preoperative holding.  She was transferred to the operating room and placed on the operating room table in supine position with the left upper extremity on an armboard.  Sedation was induced.  Left upper extremity was prepped and draped in normal sterile orthopedic fashion.  A surgical pause was performed between the surgeons, anesthesia and operating room staff and all were in agreement as to the patient, procedure and site of procedure.  Tourniquet at the proximal aspect of the extremity was inflated to 250 mmHg after exsanguination of the limb with an Esmarch bandage.  Standard volar Mallie Mussel approach was used.  The bipolar electrocautery was used to obtain hemostasis.  Superficial and deep portions of the FCR tendon sheath were incised, and the FCR and FPL swept ulnarly to protect the palmar cutaneous branch of the median nerve.  The brachialis was released at the radial side of the radius. The pronator quadratus was released and elevated with periosteal elevator.  Fracture site was identified.  It was clear of soft tissue interposition.  It was able to be  reduced under direct visualization.  C- arm was used in AP and lateral projections to ensure appropriate reduction, which was the case.  There was impaction of the cancellous bone.  It was felt bone graft would be appropriate.  Cancellous bone chips were packed into the fracture site.  A volar distal radial plate from the Acumed set was selected and secured to the bone with guidepins. It was adjusted until appropriate.  C-arm was used in AP and lateral projections to ensure appropriate reduction and position of hardware which was the case.  The standard AO drilling and measuring technique was used.  A single screw was placed in a slotted  hole in the shaft of the plate.  The distal holes were filled with locking pegs with the exception of styloid holes, which were filled with locking screws.  The remaining two holes in the shaft of the plate were filled with nonlocking screws.  Good purchase was obtained.  C-arm was used in AP, lateral and oblique projections to ensure appropriate reduction and position of hardware, which was the case.  The wrist was placed through range of motion and good pronation and supination was obtained.  The wound was copiously irrigated with sterile saline.  The pronator quadratus was repaired back over top of the plate with 4-0 Vicryl suture in a figure-of-eight fashion.  Inverted interrupted Vicryl sutures were placed in subcutaneous tissues and skin was closed with 4-0 nylon in a horizontal mattress fashion.  The wound was dressed with sterile Xeroform, 4x4s, and wrapped with a Kerlix bandage.  The volar splint was placed and wrapped with Kerlix and Ace bandage.  Tourniquet was deflated at 61 minutes.  Fingertips were pink with brisk capillary refill after deflation of the tourniquet.  Operative drapes were broken down and the patient was awoken from anesthesia safely.  She was transferred back to stretcher and taken to PACU in stable condition.  I will see her back in the office in 1 week for postoperative followup.  We will give her Percocet 5/325, 1-2 p.o. q.6 hours p.r.n. pain, dispensed #40.     Leanora Cover, MD     KK/MEDQ  D:  04/28/2015  T:  04/29/2015  Job:  [id number]

## 2015-05-04 DIAGNOSIS — Z4889 Encounter for other specified surgical aftercare: Secondary | ICD-10-CM | POA: Diagnosis not present

## 2015-05-19 DIAGNOSIS — F321 Major depressive disorder, single episode, moderate: Secondary | ICD-10-CM | POA: Diagnosis not present

## 2015-05-19 DIAGNOSIS — S52509E Unspecified fracture of the lower end of unspecified radius, subsequent encounter for open fracture type I or II with routine healing: Secondary | ICD-10-CM | POA: Diagnosis not present

## 2015-05-19 DIAGNOSIS — N3941 Urge incontinence: Secondary | ICD-10-CM | POA: Diagnosis not present

## 2015-05-19 DIAGNOSIS — D638 Anemia in other chronic diseases classified elsewhere: Secondary | ICD-10-CM | POA: Diagnosis not present

## 2015-05-19 DIAGNOSIS — M859 Disorder of bone density and structure, unspecified: Secondary | ICD-10-CM | POA: Diagnosis not present

## 2015-05-19 DIAGNOSIS — G2581 Restless legs syndrome: Secondary | ICD-10-CM | POA: Diagnosis not present

## 2015-05-19 DIAGNOSIS — Z6829 Body mass index (BMI) 29.0-29.9, adult: Secondary | ICD-10-CM | POA: Diagnosis not present

## 2015-05-19 DIAGNOSIS — Z1389 Encounter for screening for other disorder: Secondary | ICD-10-CM | POA: Diagnosis not present

## 2015-05-19 DIAGNOSIS — M1990 Unspecified osteoarthritis, unspecified site: Secondary | ICD-10-CM | POA: Diagnosis not present

## 2015-05-19 DIAGNOSIS — Z23 Encounter for immunization: Secondary | ICD-10-CM | POA: Diagnosis not present

## 2015-05-19 DIAGNOSIS — E559 Vitamin D deficiency, unspecified: Secondary | ICD-10-CM | POA: Diagnosis not present

## 2015-05-26 DIAGNOSIS — R200 Anesthesia of skin: Secondary | ICD-10-CM | POA: Diagnosis not present

## 2015-05-26 DIAGNOSIS — M6281 Muscle weakness (generalized): Secondary | ICD-10-CM | POA: Diagnosis not present

## 2015-05-26 DIAGNOSIS — M25639 Stiffness of unspecified wrist, not elsewhere classified: Secondary | ICD-10-CM | POA: Diagnosis not present

## 2015-05-26 DIAGNOSIS — S52532D Colles' fracture of left radius, subsequent encounter for closed fracture with routine healing: Secondary | ICD-10-CM | POA: Diagnosis not present

## 2015-06-08 DIAGNOSIS — S52532D Colles' fracture of left radius, subsequent encounter for closed fracture with routine healing: Secondary | ICD-10-CM | POA: Diagnosis not present

## 2015-07-07 ENCOUNTER — Encounter: Payer: Self-pay | Admitting: Neurology

## 2015-07-07 ENCOUNTER — Ambulatory Visit (INDEPENDENT_AMBULATORY_CARE_PROVIDER_SITE_OTHER): Payer: Self-pay | Admitting: Neurology

## 2015-07-07 DIAGNOSIS — G2581 Restless legs syndrome: Secondary | ICD-10-CM | POA: Insufficient documentation

## 2015-07-07 DIAGNOSIS — Z0289 Encounter for other administrative examinations: Secondary | ICD-10-CM

## 2015-07-07 NOTE — Progress Notes (Signed)
[facility]  PATIENT: Samantha [name] DOB: [birth year]   REASON FOR VISIT: Followup for restless leg syndrome and obstructive sleep apnea    HISTORY OF PRESENT ILLNESS: Samantha [name], 75 year old female returns for followup.  She was last seen in this office in 11/24/2012- CM -  the patient suffers from restless leg syndrome but currently takes ropinirole which has reduced his symptoms to some mild severity. She has a history of restless leg syndrome and and is currently on Requip 0.5 daily which has been very beneficial.  She used the restless leg syndrome rating scale that is associated with the [facility] restless leg syndrome quality of life questionnaire. She states that she has trouble concentrating in the afternoon most of the time concentrating in the evening and that this is due to having restless leg sometimes before she takes her medication at bedtime. She can confirm that she has an irresistible urge to move if she would not take her medication.   Her restless legs have been distressing to her. Reactive depression scale was endorsed at 6 points, she confirmed that she feels clinically depressed. Her sleepiness score was endorsed at 4 points only and the fatigue severity score is endorsed at 48 points-  Elevated. Samantha [name] sister was murdered in September of last year, her motor was apprehended and arrested yesterday. She is very emotional about this understandably.   She has a history of sleep apnea which was diagnosed during PSG 08/15/2011. Her download shows a high compliance.  She currently uses a nasal pillow and humidity. She also has insomnia at baseline and uses Sonata when necessary, which is rarely, she had no refill in the last 11 month. She  claims she uses her CPAP every night, she brought her machine in today for download. Used to machine 26 out of 30 days over 4 hours at night her residual AHI is 4.7 the air leak is high to moderately high. The set  pressure is  9 cm water with 2 cm EPR.  Interval history from 12-30-14. Samantha [name] is here today after her visit 5 weeks ago to see if he can enroll her into the restless leg study.  She takes Requip 0.5 milligrams at nighttime or at evening time and actually her nocturnal sleep is no longer affected by restless legs - but she continues to be restless and continuously moving throughout the day she could not take a nap because of the restless legs in daytime.  He could also not tolerate the Requip in daytime is causing her to be sleepy. The questionnaire for Restless legs : IRLS at 19 points.   Interval history from 07/07/2015 research visit.  Patient is extremely fidgety .    REVIEW OF SYSTEMS: Full 14 system review of systems performed and notable only for those listed, all others are neg:  Constitutional: Fatigue  Cardiovascular: Murmur  Ear/Nose/Throat: Hearing loss  Gastroitestinal: Urinary frequency , rectal bleeding reported. Musculoskeletal: Back pain Allergy/Immunology: Food allergies Sleep : Restless legs, sleep apnea   ALLERGIES: Allergies  Allergen Reactions  . Codeine     Upset stomach    . Iohexol Hives     Desc: Pt. states she broke out in hives 30 yrs ago w/ a CT Brain scan.  She has since had a heart cath and this CT today (08/10/05) w/o premeds and did well. No evident reaction.  Thanks.   . Shellfish Allergy Itching    Past history of itching with shrimp, has  a history of allergy shots , no problem with seafood in years per patient.    HOME MEDICATIONS: Outpatient Prescriptions Prior to Visit  Medication Sig Dispense Refill  . carvedilol (COREG) 25 MG tablet TAKE 1 TABLET (25 MG TOTAL) BY MOUTH 2 (TWO) TIMES DAILY WITH A MEAL. 180 tablet 1  . cetirizine (ZYRTEC) 10 MG tablet Take 10 mg by mouth daily.    . citalopram (CELEXA) 40 MG tablet Take 40 mg by mouth as needed (depression). 1 tab po qd/ pt not taking regularly     . losartan (COZAAR) 50 MG tablet  TAKE 1 TABLET (50 MG TOTAL) BY MOUTH DAILY. 90 tablet 3  . oxyCODONE-acetaminophen (PERCOCET) 5-325 MG tablet 1-2 tabs po q6 hours prn pain 40 tablet 0  . oxyCODONE-acetaminophen (PERCOCET/ROXICET) 5-325 MG tablet Take 2 tablets by mouth every 4 (four) hours as needed for severe pain. 4 tablet 0  . triamcinolone cream (KENALOG) 0.1 % Apply 0.1 application topically 2 (two) times daily. APPLY TO AFFECTED AREA TWICE DAILY AS NEEDED. DO NOT APPLY TO FACE, GROIN,UNDERARMS.  2   No facility-administered medications prior to visit.    PAST MEDICAL HISTORY: Past Medical History  Diagnosis Date  . Conjunctivitis   . Febrile neutropenia ([place])   . Anemia   . Hypokalemia   . Mucositis   . LBBB (left bundle branch block)   . Nonischemic cardiomyopathy ([facility])     EF 30 to 35%  . S/P cardiac cath 2000    Normal coronaries  . CHD (coronary heart disease)   . 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 CHF (congestive heart failure) ([place])   . Cancer of colon ([place]) 08/2005    STAGE 1 CHEMO AND RADIATION  . Movement disorder   . Colitis   . Sleep apnea     uses CPAP nightly    PAST SURGICAL HISTORY: Past Surgical History  Procedure Laterality Date  . Appendectomy    . Cesarean section    . Cholecystectomy    . Tonsillectomy    . Port-a-cath removal    . US echocardiography  3/12    EF 30 to 35%  . Hemorroidectomy    . Tubal ligation    . Cataract surgery removal      bilateral  . Cardiac catheterization    . Open reduction internal fixation (orif) distal radial fracture Left 04/28/2015    Procedure: OPEN REDUCTION INTERNAL FIXATION (ORIF) LEFT DISTAL RADIUS FRACTURE;  Surgeon: Leanora Cover, MD;  Location: [facility];  Service: Orthopedics;  Laterality: Left;    FAMILY HISTORY: Family History  Problem Relation Age of Onset  . Diabetes Sister   . Cancer Brother     PROSTATE    SOCIAL HISTORY: Social History   Social History  . Marital Status: Divorced    Spouse Name: N/A    . Number of Children: 2  . Years of Education: 12   Occupational History  .    [employer] Retired    Social History Main Topics  . Smoking status: Never Smoker   . Smokeless tobacco: Never Used  . Alcohol Use: No  . Drug Use: No  . Sexual Activity: No     Comment: 1st intercourse 75 yo-More than 5 partners   Other Topics Concern  . Not on file   Social History Narrative   Patient lives at home alone.    Patient has 2 children.    Patient is retired.  Patient has a high school education.    Patient is divorced.    Patient is right handed.      PHYSICAL EXAM  Generalized: Well developed, in no acute distress  Head: normocephalic and atraumatic,. Oropharynx benign   Neurological examination   Mentation: Alert oriented to time, place, history taking. Follows all commands speech and language fluent. ESS =9. International restless leg rating scale 20.   Cranial nerve : Pupils were equal round reactive to light extraocular movements were full, visual field were full on confrontational test. She has endpoint nystagmus.   Facial sensation and strength were normal. hearing decreased on the right  Uvula tongue midline. head turning and shoulder shrug were normal and symmetric.Tongue protrusion into cheek strength was normal. Motor: normal bulk and tone, full strength in the BUE, BLE,  No focal weakness.  Mild essential tremor bilaterally Sensory: normal and symmetric to light touch, pinprick, and  vibration  Coordination: finger-nose-finger, heel-to-shin bilaterally, no dysmetria Reflexes:2/2,  Brisk , not with clonus. plantar responses were flexor bilaterally. Gait and Station: Rising up from seated position without assistance, normal stance, moderate stride, good arm swing, smooth turning, able to perform tiptoe, and heel walking without difficulty. Tandem gait is steady. No assistive device  DIAGNOSTIC DATA (LABS, IMAGING, TESTING) - I reviewed patient records, labs, notes, testing  and imaging myself where available.  I reviewed CMET, CBC, ferritin, total iron-binding capacity and free iron.   Lab Results  Component Value Date   WBC 6.2 11/25/2014   HGB 12.2 09/02/2014   HCT 36.2 11/25/2014   MCV 96 11/25/2014   PLT 232 11/25/2014   75 y.o. year old female  has a past medical history of obstructive sleep apnea diagnosed in February 2013 currently using nasal pillow. RLS, insomnia.    Restless leg syndrome controlled on Requip. Ropinorol makes her sleepy.    Irresistible urge to move - If not taking dopamine or requip. No associated neuropathy.no spinal stenosis known.  Enrollment in [place] study, now completed. Patient did benefit from infusion therapy.  The patient is so restless today somewhat jittery, she reports rectal bleeding and she will see her gastroenterologist today, Dr.Mann.  RV prn in research protcol.    [place], [facility] [address] [place], [place] [address] [phone]

## 2015-07-10 ENCOUNTER — Other Ambulatory Visit: Payer: Self-pay | Admitting: Cardiovascular Disease

## 2015-07-12 ENCOUNTER — Encounter: Payer: Medicare Other | Admitting: Neurology

## 2015-07-20 DIAGNOSIS — G5602 Carpal tunnel syndrome, left upper limb: Secondary | ICD-10-CM | POA: Diagnosis not present

## 2015-07-20 DIAGNOSIS — S52532D Colles' fracture of left radius, subsequent encounter for closed fracture with routine healing: Secondary | ICD-10-CM | POA: Diagnosis not present

## 2015-07-20 DIAGNOSIS — M79645 Pain in left finger(s): Secondary | ICD-10-CM | POA: Diagnosis not present

## 2015-07-26 DIAGNOSIS — D1801 Hemangioma of skin and subcutaneous tissue: Secondary | ICD-10-CM | POA: Diagnosis not present

## 2015-07-26 DIAGNOSIS — Z872 Personal history of diseases of the skin and subcutaneous tissue: Secondary | ICD-10-CM | POA: Diagnosis not present

## 2015-07-26 DIAGNOSIS — L821 Other seborrheic keratosis: Secondary | ICD-10-CM | POA: Diagnosis not present

## 2015-07-26 DIAGNOSIS — D225 Melanocytic nevi of trunk: Secondary | ICD-10-CM | POA: Diagnosis not present

## 2015-07-26 DIAGNOSIS — L812 Freckles: Secondary | ICD-10-CM | POA: Diagnosis not present

## 2015-08-04 DIAGNOSIS — G5602 Carpal tunnel syndrome, left upper limb: Secondary | ICD-10-CM | POA: Diagnosis not present

## 2015-08-10 DIAGNOSIS — G5602 Carpal tunnel syndrome, left upper limb: Secondary | ICD-10-CM | POA: Diagnosis not present

## 2015-08-11 ENCOUNTER — Other Ambulatory Visit: Payer: Self-pay | Admitting: Orthopedic Surgery

## 2015-08-29 ENCOUNTER — Encounter (HOSPITAL_BASED_OUTPATIENT_CLINIC_OR_DEPARTMENT_OTHER): Payer: Self-pay | Admitting: *Deleted

## 2015-09-01 ENCOUNTER — Encounter (HOSPITAL_BASED_OUTPATIENT_CLINIC_OR_DEPARTMENT_OTHER): Payer: Self-pay | Admitting: *Deleted

## 2015-09-01 ENCOUNTER — Ambulatory Visit (HOSPITAL_BASED_OUTPATIENT_CLINIC_OR_DEPARTMENT_OTHER): Payer: Medicare Other | Admitting: Certified Registered"

## 2015-09-01 ENCOUNTER — Ambulatory Visit (HOSPITAL_BASED_OUTPATIENT_CLINIC_OR_DEPARTMENT_OTHER)
Admission: RE | Admit: 2015-09-01 | Discharge: 2015-09-01 | Disposition: A | Discharge status: Home / Self Care | Payer: Medicare Other | Source: Ambulatory Visit | Attending: Orthopedic Surgery | Admitting: Orthopedic Surgery

## 2015-09-01 ENCOUNTER — Encounter (HOSPITAL_BASED_OUTPATIENT_CLINIC_OR_DEPARTMENT_OTHER)
Admission: RE | Disposition: A | Discharge status: Home / Self Care | Payer: Self-pay | Source: Ambulatory Visit | Attending: Orthopedic Surgery

## 2015-09-01 DIAGNOSIS — Z85038 Personal history of other malignant neoplasm of large intestine: Secondary | ICD-10-CM | POA: Diagnosis not present

## 2015-09-01 DIAGNOSIS — I2510 Atherosclerotic heart disease of native coronary artery without angina pectoris: Secondary | ICD-10-CM | POA: Diagnosis not present

## 2015-09-01 DIAGNOSIS — G5602 Carpal tunnel syndrome, left upper limb: Secondary | ICD-10-CM | POA: Diagnosis not present

## 2015-09-01 DIAGNOSIS — I10 Essential (primary) hypertension: Secondary | ICD-10-CM | POA: Insufficient documentation

## 2015-09-01 DIAGNOSIS — Z885 Allergy status to narcotic agent status: Secondary | ICD-10-CM | POA: Diagnosis not present

## 2015-09-01 DIAGNOSIS — G4730 Sleep apnea, unspecified: Secondary | ICD-10-CM | POA: Diagnosis not present

## 2015-09-01 DIAGNOSIS — I739 Peripheral vascular disease, unspecified: Secondary | ICD-10-CM | POA: Diagnosis not present

## 2015-09-01 HISTORY — DX: Essential (primary) hypertension: I10

## 2015-09-01 HISTORY — PX: CARPAL TUNNEL RELEASE: SHX101

## 2015-09-01 SURGERY — CARPAL TUNNEL RELEASE
Anesthesia: General | Site: Wrist | Laterality: Left

## 2015-09-01 MED ORDER — LIDOCAINE HCL (CARDIAC) 20 MG/ML IV SOLN
INTRAVENOUS | Status: AC
  Filled 2015-09-01: qty 5

## 2015-09-01 MED ORDER — PROPOFOL 10 MG/ML IV BOLUS
INTRAVENOUS | Status: DC | PRN
  Administered 2015-09-01: 150 mg via INTRAVENOUS

## 2015-09-01 MED ORDER — FENTANYL CITRATE (PF) 100 MCG/2ML IJ SOLN
50.0000 ug | INTRAMUSCULAR | Status: DC | PRN
  Administered 2015-09-01: 50 ug via INTRAVENOUS

## 2015-09-01 MED ORDER — SCOPOLAMINE 1 MG/3DAYS TD PT72: 1.0000 | MEDICATED_PATCH | Freq: Once | TRANSDERMAL | Status: DC | PRN

## 2015-09-01 MED ORDER — GLYCOPYRROLATE 0.2 MG/ML IJ SOLN: 0.2000 mg | Freq: Once | INTRAMUSCULAR | Status: DC | PRN

## 2015-09-01 MED ORDER — ONDANSETRON HCL 4 MG/2ML IJ SOLN
INTRAMUSCULAR | Status: AC
  Filled 2015-09-01: qty 2

## 2015-09-01 MED ORDER — MIDAZOLAM HCL 2 MG/2ML IJ SOLN: 1.0000 mg | INTRAMUSCULAR | Status: DC | PRN

## 2015-09-01 MED ORDER — CEFAZOLIN SODIUM-DEXTROSE 2-3 GM-% IV SOLR
INTRAVENOUS | Status: AC
  Filled 2015-09-01: qty 50

## 2015-09-01 MED ORDER — HYDROCODONE-ACETAMINOPHEN 5-325 MG PO TABS: ORAL_TABLET | ORAL | Status: DC

## 2015-09-01 MED ORDER — ONDANSETRON HCL 4 MG/2ML IJ SOLN
INTRAMUSCULAR | Status: DC | PRN
  Administered 2015-09-01: 4 mg via INTRAVENOUS

## 2015-09-01 MED ORDER — DEXAMETHASONE SODIUM PHOSPHATE 10 MG/ML IJ SOLN
INTRAMUSCULAR | Status: AC
  Filled 2015-09-01: qty 1

## 2015-09-01 MED ORDER — LACTATED RINGERS IV SOLN
INTRAVENOUS | Status: DC
  Administered 2015-09-01: 10 mL/h via INTRAVENOUS

## 2015-09-01 MED ORDER — CHLORHEXIDINE GLUCONATE 4 % EX LIQD: 60.0000 mL | Freq: Once | CUTANEOUS | Status: DC

## 2015-09-01 MED ORDER — FENTANYL CITRATE (PF) 100 MCG/2ML IJ SOLN: 25.0000 ug | INTRAMUSCULAR | Status: DC | PRN

## 2015-09-01 MED ORDER — LIDOCAINE HCL (CARDIAC) 20 MG/ML IV SOLN
INTRAVENOUS | Status: DC | PRN
  Administered 2015-09-01: 60 mg via INTRAVENOUS

## 2015-09-01 MED ORDER — EPHEDRINE SULFATE 50 MG/ML IJ SOLN
INTRAMUSCULAR | Status: DC | PRN
  Administered 2015-09-01: 10 mg via INTRAVENOUS

## 2015-09-01 MED ORDER — DEXAMETHASONE SODIUM PHOSPHATE 10 MG/ML IJ SOLN
INTRAMUSCULAR | Status: DC | PRN
  Administered 2015-09-01: 4 mg via INTRAVENOUS

## 2015-09-01 MED ORDER — BUPIVACAINE HCL (PF) 0.25 % IJ SOLN
INTRAMUSCULAR | Status: DC | PRN
  Administered 2015-09-01: 10 mL

## 2015-09-01 MED ORDER — CEFAZOLIN SODIUM-DEXTROSE 2-3 GM-% IV SOLR
2.0000 g | INTRAVENOUS | Status: AC
  Administered 2015-09-01: 2 g via INTRAVENOUS

## 2015-09-01 MED ORDER — PROPOFOL 500 MG/50ML IV EMUL
INTRAVENOUS | Status: AC
  Filled 2015-09-01: qty 50

## 2015-09-01 MED ORDER — FENTANYL CITRATE (PF) 100 MCG/2ML IJ SOLN
INTRAMUSCULAR | Status: AC
  Filled 2015-09-01: qty 2

## 2015-09-01 SURGICAL SUPPLY — 38 items
BANDAGE ACE 3X5.8 VEL STRL LF (GAUZE/BANDAGES/DRESSINGS) ×2 IMPLANT
BLADE SURG 15 STRL LF DISP TIS (BLADE) ×2 IMPLANT
BLADE SURG 15 STRL SS (BLADE) ×4
BNDG CMPR 9X4 STRL LF SNTH (GAUZE/BANDAGES/DRESSINGS) ×1
BNDG ESMARK 4X9 LF (GAUZE/BANDAGES/DRESSINGS) ×1 IMPLANT
BNDG GAUZE ELAST 4 BULKY (GAUZE/BANDAGES/DRESSINGS) ×2 IMPLANT
CHLORAPREP W/TINT 26ML (MISCELLANEOUS) ×2 IMPLANT
CORDS BIPOLAR (ELECTRODE) ×2 IMPLANT
COVER BACK TABLE 60X90IN (DRAPES) ×2 IMPLANT
COVER MAYO STAND STRL (DRAPES) ×2 IMPLANT
CUFF TOURNIQUET SINGLE 18IN (TOURNIQUET CUFF) ×2 IMPLANT
DRAPE EXTREMITY T 121X128X90 (DRAPE) ×2 IMPLANT
DRAPE SURG 17X23 STRL (DRAPES) ×2 IMPLANT
DRSG PAD ABDOMINAL 8X10 ST (GAUZE/BANDAGES/DRESSINGS) ×2 IMPLANT
GAUZE SPONGE 4X4 12PLY STRL (GAUZE/BANDAGES/DRESSINGS) ×2 IMPLANT
GAUZE XEROFORM 1X8 LF (GAUZE/BANDAGES/DRESSINGS) ×2 IMPLANT
GLOVE BIO SURGEON STRL SZ7.5 (GLOVE) ×3 IMPLANT
GLOVE BIOGEL M STRL SZ7.5 (GLOVE) ×1 IMPLANT
GLOVE BIOGEL PI IND STRL 7.0 (GLOVE) IMPLANT
GLOVE BIOGEL PI IND STRL 8 (GLOVE) ×1 IMPLANT
GLOVE BIOGEL PI INDICATOR 7.0 (GLOVE) ×1
GLOVE BIOGEL PI INDICATOR 8 (GLOVE) ×3
GLOVE SURG SS PI 7.0 STRL IVOR (GLOVE) ×1 IMPLANT
GOWN STRL REUS W/ TWL LRG LVL3 (GOWN DISPOSABLE) ×1 IMPLANT
GOWN STRL REUS W/TWL LRG LVL3 (GOWN DISPOSABLE) ×2
GOWN STRL REUS W/TWL XL LVL3 (GOWN DISPOSABLE) ×2 IMPLANT
NDL HYPO 25X1 1.5 SAFETY (NEEDLE) IMPLANT
NEEDLE HYPO 25X1 1.5 SAFETY (NEEDLE) ×2 IMPLANT
NS IRRIG 1000ML POUR BTL (IV SOLUTION) ×2 IMPLANT
PACK BASIN DAY SURGERY FS (CUSTOM PROCEDURE TRAY) ×2 IMPLANT
PADDING CAST ABS 4INX4YD NS (CAST SUPPLIES) ×1
PADDING CAST ABS COTTON 4X4 ST (CAST SUPPLIES) ×1 IMPLANT
STOCKINETTE 4X48 STRL (DRAPES) ×2 IMPLANT
SUT ETHILON 4 0 PS 2 18 (SUTURE) ×2 IMPLANT
SYR BULB 3OZ (MISCELLANEOUS) ×2 IMPLANT
SYR CONTROL 10ML LL (SYRINGE) ×1 IMPLANT
TOWEL OR 17X24 6PK STRL BLUE (TOWEL DISPOSABLE) ×4 IMPLANT
UNDERPAD 30X30 (UNDERPADS AND DIAPERS) ×2 IMPLANT

## 2015-09-01 NOTE — Brief Op Note (Signed)
09/01/2015  11:47 AM  PATIENT:  Samantha [name]  75 y.o. female  PRE-OPERATIVE DIAGNOSIS:  LEFT CARPAL TUNNEL SYNDROME  POST-OPERATIVE DIAGNOSIS:  LEFT CARPAL TUNNEL SYNDROME  PROCEDURE:  Procedure(s): LEFT CARPAL TUNNEL RELEASE (Left)  SURGEON:  Surgeon(s) and Role:    * Leanora Cover, MD - Primary  PHYSICIAN ASSISTANT:   ASSISTANTS: none   ANESTHESIA:   general  EBL:  Total I/O In: 300 [I.V.:300] Out: -   BLOOD ADMINISTERED:none  DRAINS: none   LOCAL MEDICATIONS USED:  MARCAINE     SPECIMEN:  No Specimen  DISPOSITION OF SPECIMEN:  N/A  COUNTS:  YES  TOURNIQUET:   Total Tourniquet Time Documented: Upper Arm (Left) - 15 minutes Total: Upper Arm (Left) - 15 minutes   DICTATION: .Other Dictation: Dictation Number [id number]  PLAN OF CARE: Discharge to home after PACU  PATIENT DISPOSITION:  PACU - hemodynamically stable.

## 2015-09-01 NOTE — Anesthesia Postprocedure Evaluation (Signed)
Anesthesia Post Note  Patient: Samantha [name]  Procedure(s) Performed: Procedure(s) (LRB): LEFT CARPAL TUNNEL RELEASE (Left)  Patient location during evaluation: PACU Anesthesia Type: General Level of consciousness: awake and alert Pain management: pain level controlled Vital Signs Assessment: post-procedure vital signs reviewed and stable Respiratory status: spontaneous breathing, nonlabored ventilation and respiratory function stable Cardiovascular status: blood pressure returned to baseline and stable Postop Assessment: no signs of nausea or vomiting Anesthetic complications: no    Last Vitals:  Filed Vitals:   09/01/15 1215 09/01/15 1245  BP: 142/75 132/55  Pulse: 70 69  Temp:  36.6 C  Resp: 17 20    Last Pain:  Filed Vitals:   09/01/15 1249  PainSc: 0-No pain                 Lam Bjorklund A

## 2015-09-01 NOTE — Op Note (Signed)
NAME:  ROSSI, BEECHLER NO.:  [id number]  MEDICAL RECORD NO.:  [id number]  LOCATION:                                 FACILITY:  PHYSICIAN:  Leanora Cover, MD        DATE OF BIRTH:  1940/10/06  DATE OF PROCEDURE:  09/01/2015 DATE OF DISCHARGE:                              OPERATIVE REPORT   PREOPERATIVE DIAGNOSIS:  Left carpal tunnel syndrome.  POSTOPERATIVE DIAGNOSIS:  Left carpal tunnel syndrome.  PROCEDURE:  Left carpal tunnel release.  SURGEON:  Leanora Cover, MD.  ANESTHESIA:  General.  IV FLUIDS:  Per anesthesia flow sheet.  ESTIMATED BLOOD LOSS:  Minimal.  COMPLICATIONS:  None.  SPECIMENS:  None.  TIME OF TOURNIQUET:  15 minutes.  DISPOSITION:  Stable to PACU.  INDICATIONS:  [name] is a 75 year old female, who has had pins and needle sensation in the hand.  She has positive nerve conduction studies.  She is status post open reduction and internal fixation of left distal radius fracture.  She wished to have a carpal tunnel release for management of her symptoms.  Risks, benefits, and alternatives of surgery were discussed including risk of blood loss; infection; damage to nerves, vessels, tendons, ligaments, bone; failure of surgery; need for additional surgery; complications with wound healing; continued pain; recurrence of carpal tunnel syndrome; and damage to motor branch. She voiced understanding of these risks and elected to proceed.  OPERATIVE COURSE:  After being identified preoperatively by myself, the patient and I agreed upon procedure and site of procedure.  Surgical site was marked.  The risks, benefits, and alternatives of surgery were reviewed and she wished to proceed.  Surgical consent had been signed. She was given IV Ancef as preoperative antibiotic prophylaxis.  She was transferred to the operating room and placed on the operating room table in supine position with left upper extremity on arm board.  General anesthesia  was induced by Anesthesiology.  Left upper extremity was prepped and draped in normal sterile orthopedic fashion.  Surgical pause was performed between surgeons, anesthesia, operating staff, and all were in agreement as to the patient, procedure, and site of procedure. Tourniquet at the proximal aspect of the extremity was inflated to 250 mmHg after exsanguination of the limb with Esmarch bandage.  Incision was made over the transverse carpal ligament, carried into subcutaneous tissues by spreading technique.  Bipolar electrocautery was used to obtain hemostasis.  The palmar fascia was sharply incised.  Transverse carpal ligament was sharply incised.  It was incised distally first. Care was taken to ensure complete decompression distally.  It was then incised proximally.  Scissors were used to split the distal aspect of the volar antebrachial fascia.  The finger was placed into the wound to ensure complete decompression which was the case.  The nerve was inspected, it was flattened and adhered to the radial leaflet.  The motor branch was identified and was intact.  The wound was copiously irrigated with sterile saline and closed with 4-0 nylon in a horizontal mattress fashion.  It was then injected with 10 mL of 0.25% plain Marcaine to aid in postoperative analgesia.  It was dressed with  sterile Xeroform, 4x4s, and ABD and wrapped with Kerlix and Ace bandage. Tourniquet deflated at 15 minutes.  Fingertips were pink with brisk capillary refill after deflation of tourniquet.  Operative drapes were broken down.  The patient was awoken from anesthesia safely.  She was transferred back to stretcher and taken to PACU in stable condition.  I will see her back in the office in 1 week for postoperative followup.  I will give her Norco 5/325, 1-2 p.o. q.6 hours p.r.n. pain, dispensed #30.     Leanora Cover, MD     KK/MEDQ  D:  09/01/2015  T:  09/01/2015  Job:  [id number]

## 2015-09-01 NOTE — Anesthesia Preprocedure Evaluation (Addendum)
Anesthesia Evaluation  Patient identified by MRN, date of birth, ID band Patient awake    Reviewed: Allergy & Precautions, H&P , Patient's Chart, lab work & pertinent test results, reviewed documented beta blocker date and time   Airway Mallampati: II  TM Distance: >3 FB Neck ROM: full    Dental no notable dental hx.    Pulmonary sleep apnea ,    Pulmonary exam normal breath sounds clear to auscultation       Cardiovascular hypertension, + CAD, + Peripheral Vascular Disease and +CHF  + dysrhythmias  Rhythm:regular Rate:Normal     Neuro/Psych    GI/Hepatic   Endo/Other    Renal/GU      Musculoskeletal   Abdominal   Peds  Hematology   Anesthesia Other Findings Nonischemic cardiomyopathy ([place])  EF 30 to 35%S/P cardiac cath 2000 Normal coronaries CHD (coronary heart disease)...... 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[facility])   CHF (congestive heart failure) ([place]) Hypertension    Sleep apnea  uses CPAP nightly     Reproductive/Obstetrics                            Anesthesia Physical Anesthesia Plan  ASA: III  Anesthesia Plan: General   Post-op Pain Management:    Induction: Intravenous  Airway Management Planned: LMA  Additional Equipment:   Intra-op Plan:   Post-operative Plan:   Informed Consent: I have reviewed the patients History and Physical, chart, labs and discussed the procedure including the risks, benefits and alternatives for the proposed anesthesia with the patient or authorized representative who has indicated his/her understanding and acceptance.   Dental Advisory Given  Plan Discussed with: CRNA and Surgeon  Anesthesia Plan Comments: (Discussed GA with LMA, possible sore throat, potential need to switch to ETT, N/V, pulmonary aspiration. Questions answered. )        Anesthesia Quick Evaluation

## 2015-09-01 NOTE — Discharge Instructions (Addendum)

## 2015-09-01 NOTE — H&P (Signed)
Samantha [name] is an 75 y.o. female.   Chief Complaint: left carpal tunnel syndrome HPI: 75 yo female with numbness in left hand.  S/p ORIF left distal radius fracture which has healed.  Positive nerve conduction studies.  She wishes to have a carpal tunnel release for management of symptoms.  Allergies:  Allergies  Allergen Reactions  . Codeine     Upset stomach    . Iohexol Hives     Desc: Pt. states she broke out in hives 30 yrs ago w/ a CT Brain scan.  She has since had a heart cath and this CT today (08/10/05) w/o premeds and did well. No evident reaction.  Thanks.   . Shellfish Allergy Itching    Past history of itching with shrimp, has a history of allergy shots , no problem with seafood in years per patient.    Past Medical History  Diagnosis Date  . Conjunctivitis   . Febrile neutropenia (Samantha [name])   . Anemia   . Hypokalemia   . Mucositis   . LBBB (left bundle branch block)   . Nonischemic cardiomyopathy ([facility])     EF 30 to 35%  . S/P cardiac cath 2000    Normal coronaries  . CHD (coronary heart disease)   . Renal artery aneurysm (Samantha [name])   . CHF (congestive heart failure) ([place])   . Cancer of colon (Samantha [name]) 08/2005    STAGE 1 CHEMO AND RADIATION  . Movement disorder   . Colitis   . Hypertension   . Sleep apnea     uses CPAP nightly    Past Surgical History  Procedure Laterality Date  . Appendectomy    . Cesarean section    . Cholecystectomy    . Tonsillectomy    . Port-a-cath removal    . US echocardiography  3/12    EF 30 to 35%  . Hemorroidectomy    . Tubal ligation    . Cataract surgery removal      bilateral  . Cardiac catheterization    . Open reduction internal fixation (orif) distal radial fracture Left 04/28/2015    Procedure: OPEN REDUCTION INTERNAL FIXATION (ORIF) LEFT DISTAL RADIUS FRACTURE;  Surgeon: Leanora Cover, MD;  Location: [facility];  Service: Orthopedics;  Laterality: Left;    Family History: Family History  Problem Relation  Age of Onset  . Diabetes Sister   . Cancer Brother     PROSTATE    Social History:   reports that she has never smoked. She has never used smokeless tobacco. She reports that she does not drink alcohol or use illicit drugs.  Medications: Medications Prior to Admission  Medication Sig Dispense Refill  . carvedilol (COREG) 25 MG tablet TAKE 1 TABLET (25 MG TOTAL) BY MOUTH 2 (TWO) TIMES DAILY WITH A MEAL. 180 tablet 3  . cetirizine (ZYRTEC) 10 MG tablet Take 10 mg by mouth daily.    . citalopram (CELEXA) 40 MG tablet Take 40 mg by mouth as needed (depression). 1 tab po qd/ pt not taking regularly     . losartan (COZAAR) 50 MG tablet TAKE 1 TABLET (50 MG TOTAL) BY MOUTH DAILY. 90 tablet 3  . rOPINIRole (REQUIP) 0.5 MG tablet Take 0.5 mg by mouth 3 (three) times daily.      No results found for this or any previous visit (from the past 48 hour(s)).  No results found.   A comprehensive review of systems was negative.   Blood pressure 120/51,  pulse 73, temperature 98 F (36.7 C), temperature source Oral, resp. rate 18, height 5\' 5"  (1.651 m), weight 76.658 kg (169 lb), SpO2 97 %.  General appearance: alert, cooperative and appears stated age Head: Normocephalic, without obvious abnormality, atraumatic Neck: supple, symmetrical, trachea midline Resp: clear to auscultation bilaterally Cardio: regular rate and rhythm GI: non-tender Extremities: sensation and capillary refill all digits.  +epl/fpl/io.  No wounds. Pulses: 2+ and symmetric Skin: Skin color, texture, turgor normal. No rashes or lesions Neurologic: Grossly normal Incision/Wound: none  Assessment/Plan Left carpal tunnel syndrome.  Non operative and operative treatment options were discussed with the patient and patient wishes to proceed with operative treatment. Risks, benefits, and alternatives of surgery were discussed and the patient agrees with the plan of care.   Shawnetta Lein R 09/01/2015, 11:03 AM

## 2015-09-01 NOTE — Transfer of Care (Signed)
Immediate Anesthesia Transfer of Care Note  Patient: Samantha [name]  Procedure(s) Performed: Procedure(s): LEFT CARPAL TUNNEL RELEASE (Left)  Patient Location: PACU  Anesthesia Type:General  Level of Consciousness: awake and patient cooperative  Airway & Oxygen Therapy: Patient Spontanous Breathing and Patient connected to face mask oxygen  Post-op Assessment: Report given to RN and Post -op Vital signs reviewed and stable  Post vital signs: Reviewed and stable  Last Vitals:  Filed Vitals:   09/01/15 1012  BP: 120/51  Pulse: 73  Temp: 36.7 C  Resp: 18    Complications: No apparent anesthesia complications

## 2015-09-01 NOTE — Op Note (Signed)
[id number] 

## 2015-09-01 NOTE — Anesthesia Procedure Notes (Addendum)
Procedure Name: LMA Insertion Date/Time: 09/01/2015 11:16 AM Performed by: Sim Choquette D Pre-anesthesia Checklist: Patient identified, Emergency Drugs available, Suction available and Patient being monitored Patient Re-evaluated:Patient Re-evaluated prior to inductionOxygen Delivery Method: Circle System Utilized Preoxygenation: Pre-oxygenation with 100% oxygen Intubation Type: IV induction Ventilation: Mask ventilation without difficulty LMA: LMA inserted LMA Size: 4.0 and 3.0 Number of attempts: 2 Airway Equipment and Method: Bite block Placement Confirmation: positive ETCO2 Tube secured with: Tape Dental Injury: Teeth and Oropharynx as per pre-operative assessment  Comments: Unable to properly seat LMA 4, removed replaced with LMA 3 easy atraumatic placement

## 2015-09-02 ENCOUNTER — Encounter (HOSPITAL_BASED_OUTPATIENT_CLINIC_OR_DEPARTMENT_OTHER): Payer: Self-pay | Admitting: Orthopedic Surgery

## 2015-09-19 DIAGNOSIS — R29898 Other symptoms and signs involving the musculoskeletal system: Secondary | ICD-10-CM | POA: Diagnosis not present

## 2015-09-19 DIAGNOSIS — M79642 Pain in left hand: Secondary | ICD-10-CM | POA: Diagnosis not present

## 2015-09-19 DIAGNOSIS — M25642 Stiffness of left hand, not elsewhere classified: Secondary | ICD-10-CM | POA: Diagnosis not present

## 2015-09-19 DIAGNOSIS — G5602 Carpal tunnel syndrome, left upper limb: Secondary | ICD-10-CM | POA: Diagnosis not present

## 2015-09-21 DIAGNOSIS — L905 Scar conditions and fibrosis of skin: Secondary | ICD-10-CM | POA: Diagnosis not present

## 2015-09-21 DIAGNOSIS — R29898 Other symptoms and signs involving the musculoskeletal system: Secondary | ICD-10-CM | POA: Diagnosis not present

## 2015-09-21 DIAGNOSIS — G5602 Carpal tunnel syndrome, left upper limb: Secondary | ICD-10-CM | POA: Diagnosis not present

## 2015-09-21 DIAGNOSIS — M25649 Stiffness of unspecified hand, not elsewhere classified: Secondary | ICD-10-CM | POA: Diagnosis not present

## 2015-09-28 DIAGNOSIS — G5602 Carpal tunnel syndrome, left upper limb: Secondary | ICD-10-CM | POA: Diagnosis not present

## 2015-09-28 DIAGNOSIS — R29898 Other symptoms and signs involving the musculoskeletal system: Secondary | ICD-10-CM | POA: Diagnosis not present

## 2015-09-28 DIAGNOSIS — M25642 Stiffness of left hand, not elsewhere classified: Secondary | ICD-10-CM | POA: Diagnosis not present

## 2015-09-28 DIAGNOSIS — L905 Scar conditions and fibrosis of skin: Secondary | ICD-10-CM | POA: Diagnosis not present

## 2015-10-05 DIAGNOSIS — L905 Scar conditions and fibrosis of skin: Secondary | ICD-10-CM | POA: Diagnosis not present

## 2015-10-05 DIAGNOSIS — R29898 Other symptoms and signs involving the musculoskeletal system: Secondary | ICD-10-CM | POA: Diagnosis not present

## 2015-10-05 DIAGNOSIS — G5602 Carpal tunnel syndrome, left upper limb: Secondary | ICD-10-CM | POA: Diagnosis not present

## 2015-10-20 DIAGNOSIS — Z1211 Encounter for screening for malignant neoplasm of colon: Secondary | ICD-10-CM | POA: Diagnosis not present

## 2015-10-20 DIAGNOSIS — K625 Hemorrhage of anus and rectum: Secondary | ICD-10-CM | POA: Diagnosis not present

## 2015-10-20 DIAGNOSIS — Z85048 Personal history of other malignant neoplasm of rectum, rectosigmoid junction, and anus: Secondary | ICD-10-CM | POA: Diagnosis not present

## 2015-10-20 DIAGNOSIS — K627 Radiation proctitis: Secondary | ICD-10-CM | POA: Diagnosis not present

## 2015-10-29 ENCOUNTER — Other Ambulatory Visit: Payer: Self-pay | Admitting: Gynecology

## 2015-10-31 ENCOUNTER — Ambulatory Visit (INDEPENDENT_AMBULATORY_CARE_PROVIDER_SITE_OTHER): Payer: Medicare Other | Admitting: Cardiovascular Disease

## 2015-10-31 ENCOUNTER — Encounter: Payer: Self-pay | Admitting: Cardiovascular Disease

## 2015-10-31 VITALS — BP 102/60 | HR 81 | Ht 65.0 in | Wt 171.1 lb

## 2015-10-31 DIAGNOSIS — D638 Anemia in other chronic diseases classified elsewhere: Secondary | ICD-10-CM | POA: Diagnosis not present

## 2015-10-31 DIAGNOSIS — I5022 Chronic systolic (congestive) heart failure: Secondary | ICD-10-CM | POA: Diagnosis not present

## 2015-10-31 DIAGNOSIS — I10 Essential (primary) hypertension: Secondary | ICD-10-CM | POA: Diagnosis not present

## 2015-10-31 DIAGNOSIS — E559 Vitamin D deficiency, unspecified: Secondary | ICD-10-CM | POA: Diagnosis not present

## 2015-10-31 DIAGNOSIS — I447 Left bundle-branch block, unspecified: Secondary | ICD-10-CM

## 2015-10-31 DIAGNOSIS — M859 Disorder of bone density and structure, unspecified: Secondary | ICD-10-CM | POA: Diagnosis not present

## 2015-10-31 DIAGNOSIS — R8299 Other abnormal findings in urine: Secondary | ICD-10-CM | POA: Diagnosis not present

## 2015-10-31 NOTE — Telephone Encounter (Signed)
Last given in dec 2015 per medication history list.

## 2015-10-31 NOTE — Patient Instructions (Signed)
Medication Instructions:  Your physician recommends that you continue on your current medications as directed. Please refer to the Current Medication list given to you today.   Labwork: None Ordered   Testing/Procedures: None Ordered   Follow-Up: Your physician wants you to follow-up in: 6 months with Dr. Nahser.  You will receive a reminder letter in the mail two months in advance. If you don't receive a letter, please call our office to schedule the follow-up appointment.   If you need a refill on your cardiac medications before your next appointment, please call your pharmacy.   Thank you for choosing [facility]! Merideth Bosque, RN [phone]    

## 2015-10-31 NOTE — Progress Notes (Signed)
Cardiology Office Note   Date:  10/31/2015   ID:  Samantha [name], DOB Jul 09, 1940, MRN [id number]  PCP:  Haywood Pao, MD  Cardiologist:   Mertie Moores, MD   Chief Complaint  Patient presents with  . Follow-up    CHF   1. Congestive heart failure- original EF 30-35%, now EF = 45-50% 2. History colon cancer-status post radiation and chemotherapy-April 2007 3. Smooth and normal coronary arteries by heart catheterization in 2000 4. OSA - wears CPAP   History of Present Illness:  She has done well from a cardiac stand point. She has had some dizziness and found her BP to be low yesterday. She has occasional palpitations and frequently finds that her heart rate is about 100 with minimal exertion.  September 18, 2012:  Samantha [name] is doing well. Still has some dyspnea - especially when bending over or stooping over. No chest pain. She eats an ice cream sundae every day, sometimes twice a day. She still eats fast food almost all of the time. She had an EF of 35% several years ago. Her EF has improved to 45-50% with medical therapy.   Sept. 22, 2014:  Samantha [name] is doing well from a cardiac standpoint. She has been found to have a lung nodule.  She has occasional episodes of dyspnea - especially with exertion. She is still eating salty foods - she salts everything that she eats.   October 05, 2013:  Doing well from a cardiac standpoint. No CP, Does have some dyspnea. Has been out working out in the yard without problems.   Oct. 28, 2015:  Samantha [name] is doing well. She finds occasional elevated BP.  She still eats salt. Which likely explains her elevated BP   Oct 18, 2014:  Samantha [name] is a 74 y.o. female who presents for follow up of her CHF. She has floaters in her visual field this am Breathing is ok. Occasionally gets short of breath.    Nov. 8, 2016: Doing ok.   Has lost weight . Breathing is ok  Oct 31, 2015: Doing well from a cardiac standpoint.  Fatigued       Past Medical History  Diagnosis Date  . Conjunctivitis   . Febrile neutropenia ([place])   . Anemia   . Hypokalemia   . Mucositis   . LBBB (left bundle branch block)   . Nonischemic cardiomyopathy (H[facility])     EF 30 to 35%  . S/P cardiac cath 2000    Normal coronaries  . CHD (coronary heart disease)   . Renal artery aneurysm ([place])   . CHF (congestive heart failure) ([place])   . Cancer of colon ([place]) 08/2005    STAGE 1 CHEMO AND RADIATION  . Movement disorder   . Colitis   . Hypertension   . Sleep apnea     uses CPAP nightly    Past Surgical History  Procedure Laterality Date  . Appendectomy    . Cesarean section    . Cholecystectomy    . Tonsillectomy    . Port-a-cath removal    . US echocardiography  3/12    EF 30 to 35%  . Hemorroidectomy    . Tubal ligation    . Cataract surgery removal      bilateral  . Cardiac catheterization    . Open reduction internal fixation (orif) distal radial fracture Left 04/28/2015    Procedure: OPEN REDUCTION INTERNAL FIXATION (ORIF) LEFT DISTAL RADIUS FRACTURE;  Surgeon: Leanora Cover,  MD;  Location: [facility];  Service: Orthopedics;  Laterality: Left;  . Carpal tunnel release Left 09/01/2015    Procedure: LEFT CARPAL TUNNEL RELEASE;  Surgeon: Leanora Cover, MD;  Location: [facility];  Service: Orthopedics;  Laterality: Left;     Current Outpatient Prescriptions  Medication Sig Dispense Refill  . carvedilol (COREG) 25 MG tablet TAKE 1 TABLET (25 MG TOTAL) BY MOUTH 2 (TWO) TIMES DAILY WITH A MEAL. 180 tablet 3  . cetirizine (ZYRTEC) 10 MG tablet Take 10 mg by mouth daily.    . citalopram (CELEXA) 40 MG tablet Take 40 mg by mouth as needed (depression). 1 tab po qd/ pt not taking regularly     . HYDROcodone-acetaminophen (NORCO) 5-325 MG tablet 1-2 tabs po q6 hours prn pain 30 tablet 0  . losartan (COZAAR) 50 MG tablet TAKE 1 TABLET (50 MG TOTAL) BY MOUTH DAILY. 90 tablet 3  . rOPINIRole (REQUIP) 0.5 MG  tablet Take 0.5 mg by mouth 3 (three) times daily.    . valACYclovir (VALTREX) 500 MG tablet TAKE 1 TABLET BY MOUTH TWICE A DAY FOR 5 DAYS 10 tablet 3   No current facility-administered medications for this visit.    Allergies:   Codeine; Iohexol; and Shellfish allergy    Social History:  The patient  reports that she has never smoked. She has never used smokeless tobacco. She reports that she does not drink alcohol or use illicit drugs.   Family History:  The patient's family history includes Cancer in her brother; Diabetes in her sister.    ROS:  Please see the history of present illness.    Review of Systems: Constitutional:  denies fever, chills, diaphoresis, appetite change and fatigue.  HEENT: denies photophobia, eye pain, redness, hearing loss, ear pain, congestion, sore throat, rhinorrhea, sneezing, neck pain, neck stiffness and tinnitus.  Respiratory: denies SOB, DOE, cough, chest tightness, and wheezing.  Cardiovascular: denies chest pain, palpitations and leg swelling.  Gastrointestinal: denies nausea, vomiting, abdominal pain, diarrhea, constipation, blood in stool.  Genitourinary: denies dysuria, urgency, frequency, hematuria, flank pain and difficulty urinating.  Musculoskeletal: denies  myalgias, back pain, joint swelling, arthralgias and gait problem.   Skin: denies pallor, rash and wound.  Neurological: denies dizziness, seizures, syncope, weakness, light-headedness, numbness and headaches.   Hematological: denies adenopathy, easy bruising, personal or family bleeding history.  Psychiatric/ Behavioral: denies suicidal ideation, mood changes, confusion, nervousness, sleep disturbance and agitation.       All other systems are reviewed and negative.    PHYSICAL EXAM: VS:  BP 102/60 mmHg  Pulse 81  Ht 5\' 5"  (1.651 m)  Wt 171 lb 1.9 oz (77.62 kg)  BMI 28.48 kg/m2 , BMI Body mass index is 28.48 kg/(m^2). GEN: Well nourished, well developed, in no acute  distress HEENT: normal Neck: no JVD, carotid bruits, or masses Cardiac: RRR; no murmurs, rubs, or gallops,no edema  Respiratory:  clear to auscultation bilaterally, normal work of breathing GI: soft, nontender, nondistended, + BS MS: no deformity or atrophy Skin: warm and dry, no rash Neuro:  Strength and sensation are intact Psych: normal   EKG:  EKG is ordered today. The ekg ordered today demonstrates  NSR at 81.   LBBB.     Recent Labs: 11/25/2014: ALT 17; Platelets 232 04/26/2015: BUN 20; Creat 0.90; Potassium 4.0; Sodium 138    Lipid Panel    Component Value Date/Time   CHOL 185 03/10/2013 1142   TRIG 91.0 03/10/2013 1142  HDL 57.30 03/10/2013 1142   CHOLHDL 3 03/10/2013 1142   VLDL 18.2 03/10/2013 1142   LDLCALC 110* 03/10/2013 1142      Wt Readings from Last 3 Encounters:  10/31/15 171 lb 1.9 oz (77.62 kg)  09/01/15 169 lb (76.658 kg)  04/28/15 176 lb 4 oz (79.946 kg)      Other studies Reviewed: Additional studies/ records that were reviewed today include: . Review of the above records demonstrates:    ASSESSMENT AND PLAN:  1. Congestive heart failure- original EF 30-35%, now EF = 45-50%,  She has not had any recent problems .  Continue same meds I've encouraged her to walk several times a week   2. History colon cancer-status post radiation and chemotherapy-April 2007  3. Smooth and normal coronary arteries by heart catheterization in 2000   Current medicines are reviewed at length with the patient today.  The patient does not have concerns regarding medicines.  The following changes have been made:  no change  Labs/ tests ordered today include:  No orders of the defined types were placed in this encounter.    Disposition:   FU with me in 6 months      Mertie Moores, MD  10/31/2015 2:59 PM    [place], [place], [place]  [address] Phone: [phone]; Fax: [phone]   [facility]  [address] [place], [place]  [address] [phone]    Fax [phone]

## 2015-11-04 DIAGNOSIS — H47012 Ischemic optic neuropathy, left eye: Secondary | ICD-10-CM | POA: Diagnosis not present

## 2015-11-04 DIAGNOSIS — H47092 Other disorders of optic nerve, not elsewhere classified, left eye: Secondary | ICD-10-CM | POA: Diagnosis not present

## 2015-11-08 DIAGNOSIS — H47012 Ischemic optic neuropathy, left eye: Secondary | ICD-10-CM | POA: Diagnosis not present

## 2015-11-15 ENCOUNTER — Ambulatory Visit: Payer: Medicare Other | Admitting: Cardiovascular Disease

## 2015-11-15 DIAGNOSIS — S52509E Unspecified fracture of the lower end of unspecified radius, subsequent encounter for open fracture type I or II with routine healing: Secondary | ICD-10-CM | POA: Diagnosis not present

## 2015-11-15 DIAGNOSIS — Z6828 Body mass index (BMI) 28.0-28.9, adult: Secondary | ICD-10-CM | POA: Diagnosis not present

## 2015-11-15 DIAGNOSIS — Z0000 Encounter for general adult medical examination without abnormal findings: Secondary | ICD-10-CM | POA: Diagnosis not present

## 2015-11-15 DIAGNOSIS — I447 Left bundle-branch block, unspecified: Secondary | ICD-10-CM | POA: Diagnosis not present

## 2015-11-15 DIAGNOSIS — C210 Malignant neoplasm of anus, unspecified: Secondary | ICD-10-CM | POA: Diagnosis not present

## 2015-11-15 DIAGNOSIS — H47092 Other disorders of optic nerve, not elsewhere classified, left eye: Secondary | ICD-10-CM | POA: Diagnosis not present

## 2015-11-15 DIAGNOSIS — Z1389 Encounter for screening for other disorder: Secondary | ICD-10-CM | POA: Diagnosis not present

## 2015-11-15 DIAGNOSIS — I110 Hypertensive heart disease with heart failure: Secondary | ICD-10-CM | POA: Diagnosis not present

## 2015-11-15 DIAGNOSIS — I10 Essential (primary) hypertension: Secondary | ICD-10-CM | POA: Diagnosis not present

## 2015-11-15 DIAGNOSIS — R3129 Other microscopic hematuria: Secondary | ICD-10-CM | POA: Diagnosis not present

## 2015-11-15 DIAGNOSIS — I509 Heart failure, unspecified: Secondary | ICD-10-CM | POA: Diagnosis not present

## 2015-11-15 DIAGNOSIS — N3941 Urge incontinence: Secondary | ICD-10-CM | POA: Diagnosis not present

## 2015-11-24 ENCOUNTER — Telehealth: Payer: Self-pay | Admitting: Neurology

## 2015-11-24 NOTE — Telephone Encounter (Signed)
sent medical release form to [facility]

## 2015-12-01 ENCOUNTER — Telehealth: Payer: Self-pay | Admitting: Cardiovascular Disease

## 2015-12-01 NOTE — Telephone Encounter (Signed)
Signed consent placed in HIM for faxing. Left message for patient regarding this information

## 2015-12-01 NOTE — Telephone Encounter (Signed)
Spoke with patient and notified her that clearance for colonoscopy has been received. I advised her that Dr. Acie Fredrickson has been in the hospital doing rounds all week and that he will come by today to sign.  She verbalized understanding and agreement.

## 2015-12-01 NOTE — Telephone Encounter (Signed)
New MEssage  Pt called to follow up on Surgical clearance- stated that Dr Collene Mares office sent clearance form on Mon6/12 about her upcoming colonoscopy. Pt requested to speak w/ RN. Please call back and discuss.

## 2015-12-05 ENCOUNTER — Encounter: Payer: Self-pay | Admitting: Gynecology

## 2015-12-05 DIAGNOSIS — Z85048 Personal history of other malignant neoplasm of rectum, rectosigmoid junction, and anus: Secondary | ICD-10-CM | POA: Diagnosis not present

## 2015-12-05 DIAGNOSIS — K621 Rectal polyp: Secondary | ICD-10-CM | POA: Diagnosis not present

## 2015-12-05 DIAGNOSIS — Z1211 Encounter for screening for malignant neoplasm of colon: Secondary | ICD-10-CM | POA: Diagnosis not present

## 2015-12-05 DIAGNOSIS — Z86010 Personal history of colonic polyps: Secondary | ICD-10-CM | POA: Diagnosis not present

## 2015-12-05 DIAGNOSIS — K635 Polyp of colon: Secondary | ICD-10-CM | POA: Diagnosis not present

## 2015-12-05 DIAGNOSIS — K6289 Other specified diseases of anus and rectum: Secondary | ICD-10-CM | POA: Diagnosis not present

## 2015-12-13 DIAGNOSIS — H47012 Ischemic optic neuropathy, left eye: Secondary | ICD-10-CM | POA: Diagnosis not present

## 2015-12-22 ENCOUNTER — Other Ambulatory Visit: Payer: Self-pay | Admitting: Cardiovascular Disease

## 2016-01-20 ENCOUNTER — Encounter (INDEPENDENT_AMBULATORY_CARE_PROVIDER_SITE_OTHER): Payer: Self-pay | Admitting: Neurology

## 2016-01-20 DIAGNOSIS — Z0289 Encounter for other administrative examinations: Secondary | ICD-10-CM

## 2016-02-02 ENCOUNTER — Encounter: Payer: Self-pay | Admitting: Gynecology

## 2016-02-02 DIAGNOSIS — Z1231 Encounter for screening mammogram for malignant neoplasm of breast: Secondary | ICD-10-CM | POA: Diagnosis not present

## 2016-02-14 DIAGNOSIS — Z6828 Body mass index (BMI) 28.0-28.9, adult: Secondary | ICD-10-CM | POA: Diagnosis not present

## 2016-02-14 DIAGNOSIS — R61 Generalized hyperhidrosis: Secondary | ICD-10-CM | POA: Diagnosis not present

## 2016-02-14 DIAGNOSIS — G2581 Restless legs syndrome: Secondary | ICD-10-CM | POA: Diagnosis not present

## 2016-03-12 ENCOUNTER — Ambulatory Visit (INDEPENDENT_AMBULATORY_CARE_PROVIDER_SITE_OTHER): Payer: Medicare Other | Admitting: Gynecology

## 2016-03-12 ENCOUNTER — Encounter: Payer: Self-pay | Admitting: Gynecology

## 2016-03-12 VITALS — BP 126/82 | Ht 64.0 in | Wt 172.0 lb

## 2016-03-12 DIAGNOSIS — Z01419 Encounter for gynecological examination (general) (routine) without abnormal findings: Secondary | ICD-10-CM

## 2016-03-12 DIAGNOSIS — N952 Postmenopausal atrophic vaginitis: Secondary | ICD-10-CM

## 2016-03-12 NOTE — Progress Notes (Signed)
    ERIC PLANO [birth year] [id number]        75 y.o.  [id number]  for breast and pelvic exam.  Past medical history,surgical history, problem list, medications, allergies, family history and social history were all reviewed and documented as reviewed in the EPIC chart.  ROS:  Performed with pertinent positives and negatives included in the history, assessment and plan.   Additional significant findings :  none   Exam: Caryn Bee assistant Vitals:   03/12/16 1620  BP: 126/82  Weight: 172 lb (78 kg)  Height: 5\' 4"  (1.626 m)   Body mass index is 29.52 kg/m.  General appearance:  Normal affect, orientation and appearance. Skin: Grossly normal HEENT: Without gross lesions.  No cervical or supraclavicular adenopathy. Thyroid normal.  Lungs:  Clear without wheezing, rales or rhonchi Cardiac: RR, without RMG Abdominal:  Soft, nontender, without masses, guarding, rebound, organomegaly or hernia Breasts:  Examined lying and sitting without masses, retractions, discharge or axillary adenopathy. Pelvic:  Ext, BUS, Vagina with atrophic changes  Cervix with atrophic changes  Uterus anteverted, normal size, shape and contour, midline and mobile nontender   Adnexa without masses or tenderness    Anus and perineum normal   Rectovaginal normal sphincter tone without palpated masses or tenderness.    Assessment/Plan:  75 y.o. [id number] female for breast and pelvic exam.   1. postmenopausal/atrophic genital changes. No significant hot flushes, night sweats, vaginal dryness or any vaginal bleeding. Continue to monitor and report any issues or bleeding. 2. Pap smear 2015. No Pap smear done today. No history of significant abnormal Pap smears. Options to stop screening based on age and current screening guidelines discussed. Will readdress on annual basis. 3. Mammography 01/2016. Continue with annual mammography when due. SBE monthly reviewed. 4. Colonoscopy 2017. Repeat at their recommended  interval. 5. DEXA reportedly done at Dr. Loren Racer office 2014. I do not have copies of these reports and she is going to continue to follow up with him in reference to bone health. 6. Health maintenance. No routine blood work done as patient does this through her primary physician's office. Follow up 1 year, sooner as needed.   Anastasio Auerbach MD, 4:35 PM 03/12/2016

## 2016-03-12 NOTE — Patient Instructions (Signed)
Your GYN exam today was normal.  Schedule a follow up exam next year.  Follow up sooner if you have any issues or problems.

## 2016-04-02 ENCOUNTER — Encounter: Payer: Self-pay | Admitting: Neurology

## 2016-04-03 ENCOUNTER — Telehealth: Payer: Self-pay | Admitting: Neurology

## 2016-04-03 NOTE — Telephone Encounter (Signed)
Pt came to the front desk with her cpap. She dropped it off and asked that I download it. CPAP was downloaded. I called pt and advised her of this information. Pt will come by and pick it up. Pt will also make a follow up appt with Dr. Brett Fairy when she picks up her cpap.

## 2016-04-03 NOTE — Telephone Encounter (Signed)
Patient would like a call back regarding her CPAP machine [phone]

## 2016-04-10 DIAGNOSIS — H47012 Ischemic optic neuropathy, left eye: Secondary | ICD-10-CM | POA: Diagnosis not present

## 2016-04-10 DIAGNOSIS — H5340 Unspecified visual field defects: Secondary | ICD-10-CM | POA: Diagnosis not present

## 2016-04-23 ENCOUNTER — Ambulatory Visit (INDEPENDENT_AMBULATORY_CARE_PROVIDER_SITE_OTHER): Payer: Medicare Other | Admitting: Neurology

## 2016-04-23 ENCOUNTER — Encounter: Payer: Self-pay | Admitting: Neurology

## 2016-04-23 VITALS — BP 112/64 | HR 78 | Resp 20 | Ht 65.0 in | Wt 172.0 lb

## 2016-04-23 DIAGNOSIS — G4731 Primary central sleep apnea: Secondary | ICD-10-CM

## 2016-04-23 DIAGNOSIS — G4739 Other sleep apnea: Secondary | ICD-10-CM

## 2016-04-23 DIAGNOSIS — Z9989 Dependence on other enabling machines and devices: Secondary | ICD-10-CM | POA: Diagnosis not present

## 2016-04-23 DIAGNOSIS — J301 Allergic rhinitis due to pollen: Secondary | ICD-10-CM | POA: Diagnosis not present

## 2016-04-23 DIAGNOSIS — G2581 Restless legs syndrome: Secondary | ICD-10-CM

## 2016-04-23 DIAGNOSIS — G4733 Obstructive sleep apnea (adult) (pediatric): Secondary | ICD-10-CM

## 2016-04-23 MED ORDER — LORATADINE 10 MG PO TABS: 10.0000 mg | ORAL_TABLET | Freq: Every day | ORAL | 1 refills | Status: DC | PRN

## 2016-04-23 MED ORDER — ROPINIROLE HCL 0.5 MG PO TABS: 0.5000 mg | ORAL_TABLET | Freq: Every day | ORAL | 3 refills | Status: DC

## 2016-04-23 NOTE — Patient Instructions (Signed)
Ropinirole tablets What is this medicine? ROPINIROLE (roe PIN i role) is used to treat the symptoms of Parkinson's disease. It helps to improve muscle control and movement difficulties. It is also used for the treatment of Restless Legs Syndrome. This medicine may be used for other purposes; ask your health care provider or pharmacist if you have questions. What should I tell my health care provider before I take this medicine? They need to know if you have any of these conditions: -dizzy or fainting spells -heart disease -high blood pressure -kidney disease -liver disease -low blood pressure -sleeping problems -an unusual or allergic reaction to ropinirole, other medicines, foods, dyes, or preservatives -pregnant or trying to get pregnant -breast-feeding How should I use this medicine? Take this medicine by mouth with a glass of water. Follow the directions on the prescription label. You can take it with or without food. If it upsets your stomach, take it with food. Take your doses at regular intervals. Do not take your medicine more often than directed. Do not stop taking this medicine except on your doctor's advice. Talk to your pediatrician regarding the use of this medicine in children. Special care may be needed. Overdosage: If you think you have taken too much of this medicine contact a poison control center or emergency room at once. NOTE: This medicine is only for you. Do not share this medicine with others. What if I miss a dose? If you miss a dose, take it as soon as you can. If it is almost time for your next dose, take only that dose. Do not take double or extra doses. What may interact with this medicine? -ciprofloxacin -female hormones, like estrogens and birth control pills -medicines for depression, anxiety, or psychotic disturbances -metoclopramide -mexiletine -norfloxacin -omeprazole This list may not describe all possible interactions. Give your health care provider  a list of all the medicines, herbs, non-prescription drugs, or dietary supplements you use. Also tell them if you smoke, drink alcohol, or use illegal drugs. Some items may interact with your medicine. What should I watch for while using this medicine? Visit your doctor or health care professional for regular checks on your progress. It may be several weeks or months before you feel the full effect of this medicine. You may get drowsy or dizzy. Do not drive, use machinery, or do anything that needs mental alertness until you know how this drug affects you. Do not stand or sit up quickly, especially if you are an older patient. This reduces the risk of dizzy or fainting spells. Alcohol can increase possible dizziness. Avoid alcoholic drinks. If you find that you have sudden feelings of wanting to sleep during normal activities, like cooking, watching television, or while driving or riding in a car, you should contact your health care professional. Your mouth may get dry. Chewing sugarless gum or sucking hard candy, and drinking plenty of water may help. Contact your doctor if the problem does not go away or is severe. There have been reports of increased sexual urges or other strong urges such as gambling while taking some medicines for Parkinson's disease. If you experience any of these urges while taking this medicine, you should report it to your health care provider as soon as possible. You should check your skin often for changes to moles and new growths while taking this medicine. Call your doctor if you notice any of these changes. What side effects may I notice from receiving this medicine? Side effects that you should   report to your doctor or health care professional as soon as possible: -changes in vision -chest pain -confusion -falling asleep during normal activities like driving -fast, irregular heartbeat -feeling faint or lightheaded, falls -hallucination, loss of contact with  reality -increase or decrease in blood pressure -joint or muscle pain -loss of bladder control -numbness, tingling, or prickly sensations -shortness of breath, troubled breathing, tightness in chest, or wheezing -suicidal thoughts or other mood changes -uncontrollable head, mouth, neck, arm, or leg movements -vomiting Side effects that usually do not require medical attention (report to your doctor or health care professional if they continue or are bothersome): -clumsiness, feeling unsteady, or dizziness, especially early in treatment -flushing -headache -increased sweating -nausea -tremor -yawning This list may not describe all possible side effects. Call your doctor for medical advice about side effects. You may report side effects to FDA at 1-800-FDA-1088. Where should I keep my medicine? Keep out of the reach of children. Store at room temperature between 20 and 25 degrees C (68 and 77 degrees F). Protect from light and moisture. Keep container tightly closed. Throw away any unused medicine after the expiration date. NOTE: This sheet is a summary. It may not cover all possible information. If you have questions about this medicine, talk to your doctor, pharmacist, or health care provider.    2016, Elsevier/Gold Standard. (2008-09-21 20:56:15)  

## 2016-04-23 NOTE — Progress Notes (Signed)
[facility]  PATIENT: Samantha [name] DOB: 1941-02-13   REASON FOR VISIT: Followup for restless leg syndrome and obstructive sleep apnea    HISTORY OF PRESENT ILLNESS: Samantha [name], 75 year old female returns for followup. She was last seen in this office in 11/24/2012- CM -the patient suffers from restless leg syndrome but currently takes ropinirole which has reduced his symptoms to some mild severity. She has a history of restless leg syndrome and and is currently on Requip 0.5 daily which has been very beneficial.  She used the restless leg syndrome rating scale that is associated with the [facility] restless leg syndrome quality of life questionnaire. She states that she has trouble concentrating in the afternoon most of the time concentrating in the evening and that this is due to having restless leg sometimes before she takes her medication at bedtime. She can confirm that she has an irresistible urge to move if she would not take her medication.   Her restless legs have been distressing to her, depression scale was endorsed at 6 points, she confirmed that she feels clinically depressed. Her sleepiness score was endorsed at 4 points only and the fatigue severity score is endorsed at 48 points-  Elevated. Samantha [name] sister was murdered in September of last year, her motor was apprehended and arrested yesterday. She is very emotional about this understandably.  She has a history of sleep apnea which was diagnosed during PS[facility] 08/15/2011. Her download shows a high compliance.  She currently uses a nasal pillow and humidity. She also has insomnia at baseline and uses Sonata when necessary, which is rarely, she had no refill in the last 11 month. She  claims she uses her CPAP every night, she brought her machine in today for download. Used to machine 26 out of 30 days over 4 hours at night her residual AHI is 4.7 the air leak is high to moderately high. The set pressure is  9 cm  water with 2 cm EPR.  Interval history from 12-30-14. Samantha [name] is here today after her visit 5 weeks ago to see if he can enroll her into the restless leg study.  She takes Requip 0.5 milligrams at nighttime or at evening time and actually her nocturnal sleep is no longer affected by restless legs - but she continues to be restless and continuously moving throughout the day she could not take a nap because of the restless legs in daytime.  He could also not tolerate the Requip in daytime is causing her to be sleepy. The questionnaire for Restless legs : IRLS at 19 points.   Interval history from 04/23/2016, at the pleasure of seeing Samantha [name] today for a revisit this is her yearly CPAP compliance visit. She has been highly compliant with 8 hours and 42 minutes of average user time per night, 93 percentile compliance but she has experienced slightly higher apnea numbers at 6.1 residual AHI. Also some air leaks are noted. The restless legs are still present but with medications very well controlled, her quality of life is not impaired by the degree of restless legs    REVIEW OF SYSTEMS: Full 14 system review of systems performed and notable only for those listed, all others are neg:  The patient endorsed the restless leg quality of life questionnaire published by [facility] this low or non-impairment of life quality impairment rated as very low while on medication, Epworth sleepiness score 5 points the severity score of 41 points, geriatric depression  score 8/15 points.   ALLERGIES: Allergies  Allergen Reactions  . Codeine     Upset stomach    . Iohexol Hives     Desc: Pt. states she broke out in hives 30 yrs ago w/ a CT Brain scan.  She has since had a heart cath and this CT today (08/10/05) w/o premeds and did well. No evident reaction.  Thanks.   . Shellfish Allergy Itching    Past history of itching with shrimp, has a history of allergy shots , no problem with seafood in years per  patient.    HOME MEDICATIONS: Outpatient Medications Prior to Visit  Medication Sig Dispense Refill  . citalopram (CELEXA) 40 MG tablet Take 40 mg by mouth as needed (depression). 1 tab po qd/ pt not taking regularly     . Nebivolol HCl (BYSTOLIC PO) Take 5 mg by mouth daily.     Marland Kitchen rOPINIRole (REQUIP) 0.5 MG tablet Take 0.5 mg by mouth 3 (three) times daily.    . cetirizine (ZYRTEC) 10 MG tablet Take 10 mg by mouth daily.    Marland Kitchen HYDROcodone-acetaminophen (NORCO) 5-325 MG tablet 1-2 tabs po q6 hours prn pain (Patient not taking: Reported on 03/12/2016) 30 tablet 0  . losartan (COZAAR) 50 MG tablet TAKE 1 TABLET (50 MG TOTAL) BY MOUTH DAILY. 90 tablet 2  . valACYclovir (VALTREX) 500 MG tablet TAKE 1 TABLET BY MOUTH TWICE A DAY FOR 5 DAYS 10 tablet 3   No facility-administered medications prior to visit.     PAST MEDICAL HISTORY: Past Medical History:  Diagnosis Date  . Anemia   . Cancer of colon ([place]) 08/2005   STAGE 1 CHEMO AND RADIATION  . CHD (coronary heart disease)   . CHF (congestive heart failure) ([place])   . Colitis   . Conjunctivitis   . Febrile neutropenia ([place])   . Hypertension   . Hypokalemia   . LBBB (left bundle branch block)   . Movement disorder   . Mucositis   . Nonischemic cardiomyopathy ([facility])    EF 30 to 35%  . 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 S/P cardiac cath 2000   Normal coronaries  . Sleep apnea    uses CPAP nightly    PAST SURGICAL HISTORY: Past Surgical History:  Procedure Laterality Date  . APPENDECTOMY    . CARDIAC CATHETERIZATION    . CARPAL TUNNEL RELEASE Left 09/01/2015   Procedure: LEFT CARPAL TUNNEL RELEASE;  Surgeon: Leanora Cover, MD;  Location: [facility];  Service: Orthopedics;  Laterality: Left;  . cataract surgery removal     bilateral  . CESAREAN SECTION    . CHOLECYSTECTOMY    . HEMORROIDECTOMY    . OPEN REDUCTION INTERNAL FIXATION (ORIF) DISTAL RADIAL FRACTURE Left 04/28/2015   Procedure: OPEN REDUCTION INTERNAL  FIXATION (ORIF) LEFT DISTAL RADIUS FRACTURE;  Surgeon: Leanora Cover, MD;  Location: [facility];  Service: Orthopedics;  Laterality: Left;  . PORT-A-CATH REMOVAL    . TONSILLECTOMY    . TUBAL LIGATION    . US ECHOCARDIOGRAPHY  3/12   EF 30 to 35%    FAMILY HISTORY: Family History  Problem Relation Age of Onset  . Diabetes Sister   . Cancer Brother     PROSTATE    SOCIAL HISTORY: Social History   Social History  . Marital status: Divorced    Spouse name: N/A  . Number of children: 2  . Years of education: 12   Occupational History  .  Retired  . Retired    Social History Main Topics  . Smoking status: Never Smoker  . Smokeless tobacco: Never Used  . Alcohol use No  . Drug use: No  . Sexual activity: No     Comment: 1st intercourse 75 yo-More than 5 partners   Other Topics Concern  . Not on file   Social History Narrative   Patient lives at home alone.    Patient has 2 children.    Patient is retired.    Patient has a high school education.    Patient is divorced.    Patient is right handed.      PHYSICAL EXAM  Generalized: Well developed, in no acute distress  Head: normocephalic and atraumatic,. Oropharynx benign   Neurological examination   Mentation: Alert oriented to time, place, history taking. Follows all commands speech and language fluent. ESS =9. International restless leg rating scale 20.   Cranial nerve : Pupils were equal round reactive to light extraocular movements were full, visual field were full on confrontational test.  She has endpoint nystagmus.  Facial sensation and strength were normal. hearing  To finger rub decreased on the right . Uvula and tongue in midline. head turning and shoulder shrug were normal and symmetric.Tongue protrusion into cheek strength was normal.  Mild essential tremor bilaterally. Motor - jittery, restless.  Sensory: normal and symmetric to light touch, pinprick, and  vibration  Coordination:  finger-nose-finger, heel-to-shin bilaterally, no dysmetria Reflexes: 2/2,  Brisk , not with clonus. plantar responses were flexor bilaterally. Gait and Station: Rising up from seated position without assistance, normal stance, moderate stride, good arm swing, smooth turning, able to perform tiptoe, and heel walking without difficulty. Tandem gait is steady. No assistive device  DIAGNOSTIC DATA (LABS, IMAGING, TESTING)  - I reviewed patient records, labs, notes, testing and imaging myself where available.  I reviewed CMET, CBC, ferritin, total iron-binding capacity and free iron.  Lab Results  Component Value Date   WBC 6.2 11/25/2014   HGB 12.2 09/02/2014   HCT 36.2 11/25/2014   MCV 96 11/25/2014   PLT 232 11/25/2014   75 y.o. year old female  has a past medical history of obstructive sleep apnea diagnosed in February 2013 currently using  CPAP nasal pillow. RLS, insomnia.    1) Obstructive sleep apnea on CPAP, the patient is 100% compliance for days 93% compliance for 28 days of over 4 hours of nightly use. Average user time is 8 hours 42 minutes, CPAP is set at 9 cm water with 2 cm EPR has moderate air leaks, residual AHI is 6.1. There are now more residual central apneas than obstructive apneas.   2) Restless leg syndrome controlled on Requip. Ropinorol makes her sleepy.   Irresistible urge to move - If not taking dopamine or requip. No associated neuropathy. Improved on iron IV.  Without her medications for restless legs she would not initiate sleep, but once she takes her medications she is usually able to fall asleep and stay asleep. No spinal stenosis known.   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 She was no longer on IV iron at that time. Diagnosed as a watershed infarct on low BP.  Needs to stay of nocturnal BP medication, PCP Dr. Osborne Casco.    4) She has a history of rectal-colorectal malignancy. Rectal bleeding. She follows with Dr. Collene Mares  in 1 month. Possible blood loss source.  [place], [facility] [address] [place], [place] [address] [phone]   Dr Osborne Casco.

## 2016-04-30 ENCOUNTER — Encounter: Payer: Self-pay | Admitting: Cardiovascular Disease

## 2016-04-30 ENCOUNTER — Ambulatory Visit (INDEPENDENT_AMBULATORY_CARE_PROVIDER_SITE_OTHER): Payer: Medicare Other | Admitting: Cardiovascular Disease

## 2016-04-30 VITALS — BP 128/60 | HR 96 | Ht 65.0 in | Wt 173.0 lb

## 2016-04-30 DIAGNOSIS — I5022 Chronic systolic (congestive) heart failure: Secondary | ICD-10-CM

## 2016-04-30 NOTE — Patient Instructions (Signed)
Your physician recommends that you continue on your current medications as directed. Please refer to the Current Medication list given to you today.  Your physician has requested that you have an echocardiogram. Echocardiography is a painless test that uses sound waves to create images of your heart. It provides your doctor with information about the size and shape of your heart and how well your heart's chambers and valves are working. This procedure takes approximately one hour. There are no restrictions for this procedure.  Your physician wants you to follow-up in: 6 months with Dr. Nahser.  You will receive a reminder letter in the mail two months in advance. If you don't receive a letter, please call our office to schedule the follow-up appointment.  

## 2016-04-30 NOTE — Progress Notes (Signed)
Cardiology Office Note   Date:  04/30/2016   ID:  Samantha [name], DOB 01/07/1941, MRN [id number]  PCP:  Haywood Pao, MD  Cardiologist:   Mertie Moores, MD   Chief Complaint  Patient presents with  . Follow-up    cHF   1. Congestive heart failure- original EF 30-35%, now EF = 45-50% 2. History colon cancer-status post radiation and chemotherapy-April 2007 3. Smooth and normal coronary arteries by heart catheterization in 2000 4. OSA - wears CPAP      She has done well from a cardiac stand point. She has had some dizziness and found her BP to be low yesterday. She has occasional palpitations and frequently finds that her heart rate is about 100 with minimal exertion.  September 18, 2012:  Samantha [name] is doing well. Still has some dyspnea - especially when bending over or stooping over. No chest pain. She eats an ice cream sundae every day, sometimes twice a day. She still eats fast food almost all of the time. She had an EF of 35% several years ago. Her EF has improved to 45-50% with medical therapy.   Sept. 22, 2014:  Samantha [name] is doing well from a cardiac standpoint. She has been found to have a lung nodule.  She has occasional episodes of dyspnea - especially with exertion. She is still eating salty foods - she salts everything that she eats.   October 05, 2013:  Doing well from a cardiac standpoint. No CP, Does have some dyspnea. Has been out working out in the yard without problems.   Oct. 28, 2015:  Samantha [name] is doing well. She finds occasional elevated BP.  She still eats salt. Which likely explains her elevated BP   Oct 18, 2014:  Samantha [name] is a 75 y.o. female who presents for follow up of her CHF. She has floaters in her visual field this am Breathing is ok. Occasionally gets short of breath.    Nov. 8, 2016: Doing ok.   Has lost weight . Breathing is ok  Oct 31, 2015: Doing well from a cardiac standpoint.  Fatigued   Nov. 13, 2017:     Having problems with nervousness. Has some cold sweats between her shoulders- night sweats. Has lost 17 lbs over the past 6 months  Has had some bleeding in her stool  ( has appt with Dr. Collene Mares tomorrow )   No CP Gets short of breath at times.   Does not get any exercise Last echo was in 2012 - EF 45-50% at that time    Past Medical History:  Diagnosis Date  . Anemia   . Cancer of colon ([place]) 08/2005   STAGE 1 CHEMO AND RADIATION  . CHD (coronary heart disease)   . CHF (congestive heart failure) ([place])   . Colitis   . Conjunctivitis   . Febrile neutropenia ([place])   . Hypertension   . Hypokalemia   . LBBB (left bundle branch block)   . Movement disorder   . Mucositis   . Nonischemic cardiomyopathy ([facility])    EF 30 to 35%  . 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 S/P cardiac cath 2000   Normal coronaries  . Sleep apnea    uses CPAP nightly    Past Surgical History:  Procedure Laterality Date  . APPENDECTOMY    . CARDIAC CATHETERIZATION    . CARPAL TUNNEL RELEASE Left 09/01/2015   Procedure: LEFT CARPAL TUNNEL RELEASE;  Surgeon: Leanora Cover, MD;  Location: [facility];  Service: Orthopedics;  Laterality: Left;  . cataract surgery removal     bilateral  . CESAREAN SECTION    . CHOLECYSTECTOMY    . HEMORROIDECTOMY    . OPEN REDUCTION INTERNAL FIXATION (ORIF) DISTAL RADIAL FRACTURE Left 04/28/2015   Procedure: OPEN REDUCTION INTERNAL FIXATION (ORIF) LEFT DISTAL RADIUS FRACTURE;  Surgeon: Leanora Cover, MD;  Location: [facility];  Service: Orthopedics;  Laterality: Left;  . PORT-A-CATH REMOVAL    . TONSILLECTOMY    . TUBAL LIGATION    . US ECHOCARDIOGRAPHY  3/12   EF 30 to 35%     Current Outpatient Prescriptions  Medication Sig Dispense Refill  . citalopram (CELEXA) 40 MG tablet Take 40 mg by mouth as needed (depression). 1 tab po qd/ pt not taking regularly     . loratadine (CLARITIN) 10 MG tablet Take 1 tablet (10 mg total) by mouth daily as  needed for allergies. 30 tablet 1  . losartan (COZAAR) 25 MG tablet Take 25 mg by mouth daily.    . Multiple Vitamin (MULTI-VITAMINS) TABS Take by mouth.    . Nebivolol HCl (BYSTOLIC PO) Take 5 mg by mouth daily.     Marland Kitchen rOPINIRole (REQUIP) 0.5 MG tablet Take 1 tablet (0.5 mg total) by mouth at bedtime. 90 tablet 3   No current facility-administered medications for this visit.     Allergies:   Codeine; Iohexol; and Shellfish allergy    Social History:  The patient  reports that she has never smoked. She has never used smokeless tobacco. She reports that she does not drink alcohol or use drugs.   Family History:  The patient's family history includes Cancer in her brother; Diabetes in her sister.    ROS:  Please see the history of present illness.    Review of Systems: Constitutional:  denies fever, chills, diaphoresis, appetite change and fatigue.  HEENT: denies photophobia, eye pain, redness, hearing loss, ear pain, congestion, sore throat, rhinorrhea, sneezing, neck pain, neck stiffness and tinnitus.  Respiratory: denies SOB, DOE, cough, chest tightness, and wheezing.  Cardiovascular: denies chest pain, palpitations and leg swelling.  Gastrointestinal: denies nausea, vomiting, abdominal pain, diarrhea, constipation, blood in stool.  Genitourinary: denies dysuria, urgency, frequency, hematuria, flank pain and difficulty urinating.  Musculoskeletal: denies  myalgias, back pain, joint swelling, arthralgias and gait problem.   Skin: denies pallor, rash and wound.  Neurological: denies dizziness, seizures, syncope, weakness, light-headedness, numbness and headaches.   Hematological: denies adenopathy, easy bruising, personal or family bleeding history.  Psychiatric/ Behavioral: denies suicidal ideation, mood changes, confusion, nervousness, sleep disturbance and agitation.       All other systems are reviewed and negative.    PHYSICAL EXAM: VS:  BP 128/60   Pulse 96   Ht 5\' 5"   (1.651 m)   Wt 173 lb (78.5 kg)   SpO2 97%   BMI 28.79 kg/m  , BMI Body mass index is 28.79 kg/m. GEN: Well nourished, well developed, in no acute distress  HEENT: normal  Neck: no JVD, carotid bruits, or masses Cardiac: RRR; no murmurs, rubs, or gallops,no edema  Respiratory:  clear to auscultation bilaterally, normal work of breathing GI: soft, nontender, nondistended, + BS MS: no deformity or atrophy  Skin: warm and dry, no rash Neuro:  Strength and sensation are intact Psych: very anxioius    EKG:  EKG is not ordered today.      Recent Labs: No results found for requested  labs within last 8760 hours.    Lipid Panel    Component Value Date/Time   CHOL 185 03/10/2013 1142   TRIG 91.0 03/10/2013 1142   HDL 57.30 03/10/2013 1142   CHOLHDL 3 03/10/2013 1142   VLDL 18.2 03/10/2013 1142   LDLCALC 110 (H) 03/10/2013 1142      Wt Readings from Last 3 Encounters:  04/30/16 173 lb (78.5 kg)  04/23/16 172 lb (78 kg)  03/12/16 172 lb (78 kg)      Other studies Reviewed: Additional studies/ records that were reviewed today include: . Review of the above records demonstrates:    ASSESSMENT AND PLAN:  1. Congestive heart failure- original EF 30-35%, now EF = 45-50%,  She has not had any recent problems .  Continue same meds I've encouraged her to walk several times a week  Will recheck echo   2. History colon cancer-status post radiation and chemotherapy-April 2007  3. Smooth and normal coronary arteries by heart catheterization in 2000  4. Anxiety:   Seem Very anxious and tremulous today. She will be seeing her primary medical doctor in several weeks.  5. Night sweats and weight loss: She has a history of colon cancer. She's been having some heme-positive stools. I have advised her to address these with Dr. Collene Mares.  Current medicines are reviewed at length with the patient today.  The patient does not have concerns regarding medicines.  The following changes have  been made:  no change  Labs/ tests ordered today include:  No orders of the defined types were placed in this encounter.   Disposition:   FU with me in 6 months      Mertie Moores, MD  04/30/2016 4:27 PM    [address], [place], [place]  [address] Phone: [phone]; Fax: [phone]

## 2016-05-01 DIAGNOSIS — Z85048 Personal history of other malignant neoplasm of rectum, rectosigmoid junction, and anus: Secondary | ICD-10-CM | POA: Diagnosis not present

## 2016-05-01 DIAGNOSIS — K625 Hemorrhage of anus and rectum: Secondary | ICD-10-CM | POA: Diagnosis not present

## 2016-05-01 DIAGNOSIS — K627 Radiation proctitis: Secondary | ICD-10-CM | POA: Diagnosis not present

## 2016-05-01 DIAGNOSIS — Z86010 Personal history of colonic polyps: Secondary | ICD-10-CM | POA: Diagnosis not present

## 2016-05-17 DIAGNOSIS — R8299 Other abnormal findings in urine: Secondary | ICD-10-CM | POA: Diagnosis not present

## 2016-05-17 DIAGNOSIS — C210 Malignant neoplasm of anus, unspecified: Secondary | ICD-10-CM | POA: Diagnosis not present

## 2016-05-17 DIAGNOSIS — N390 Urinary tract infection, site not specified: Secondary | ICD-10-CM | POA: Diagnosis not present

## 2016-05-17 DIAGNOSIS — I110 Hypertensive heart disease with heart failure: Secondary | ICD-10-CM | POA: Diagnosis not present

## 2016-05-17 DIAGNOSIS — H47092 Other disorders of optic nerve, not elsewhere classified, left eye: Secondary | ICD-10-CM | POA: Diagnosis not present

## 2016-05-17 DIAGNOSIS — D638 Anemia in other chronic diseases classified elsewhere: Secondary | ICD-10-CM | POA: Diagnosis not present

## 2016-05-17 DIAGNOSIS — E559 Vitamin D deficiency, unspecified: Secondary | ICD-10-CM | POA: Diagnosis not present

## 2016-05-17 DIAGNOSIS — I509 Heart failure, unspecified: Secondary | ICD-10-CM | POA: Diagnosis not present

## 2016-05-17 DIAGNOSIS — G4733 Obstructive sleep apnea (adult) (pediatric): Secondary | ICD-10-CM | POA: Diagnosis not present

## 2016-05-17 DIAGNOSIS — M8580 Other specified disorders of bone density and structure, unspecified site: Secondary | ICD-10-CM | POA: Diagnosis not present

## 2016-05-17 DIAGNOSIS — R413 Other amnesia: Secondary | ICD-10-CM | POA: Diagnosis not present

## 2016-05-17 DIAGNOSIS — I10 Essential (primary) hypertension: Secondary | ICD-10-CM | POA: Diagnosis not present

## 2016-05-17 DIAGNOSIS — G2581 Restless legs syndrome: Secondary | ICD-10-CM | POA: Diagnosis not present

## 2016-05-17 DIAGNOSIS — I722 Aneurysm of renal artery: Secondary | ICD-10-CM | POA: Diagnosis not present

## 2016-05-18 DIAGNOSIS — Z95810 Presence of automatic (implantable) cardiac defibrillator: Secondary | ICD-10-CM

## 2016-05-18 HISTORY — DX: Presence of automatic (implantable) cardiac defibrillator: Z95.810

## 2016-05-23 ENCOUNTER — Ambulatory Visit (HOSPITAL_COMMUNITY): Payer: Medicare Other | Attending: Cardiology

## 2016-05-23 ENCOUNTER — Other Ambulatory Visit: Payer: Self-pay

## 2016-05-23 DIAGNOSIS — G4733 Obstructive sleep apnea (adult) (pediatric): Secondary | ICD-10-CM | POA: Insufficient documentation

## 2016-05-23 DIAGNOSIS — I5022 Chronic systolic (congestive) heart failure: Secondary | ICD-10-CM | POA: Insufficient documentation

## 2016-05-23 DIAGNOSIS — I2510 Atherosclerotic heart disease of native coronary artery without angina pectoris: Secondary | ICD-10-CM | POA: Insufficient documentation

## 2016-05-23 DIAGNOSIS — Z923 Personal history of irradiation: Secondary | ICD-10-CM | POA: Diagnosis not present

## 2016-05-23 DIAGNOSIS — I447 Left bundle-branch block, unspecified: Secondary | ICD-10-CM | POA: Insufficient documentation

## 2016-05-23 DIAGNOSIS — I428 Other cardiomyopathies: Secondary | ICD-10-CM | POA: Diagnosis not present

## 2016-05-23 DIAGNOSIS — I110 Hypertensive heart disease with heart failure: Secondary | ICD-10-CM | POA: Diagnosis not present

## 2016-05-23 MED ORDER — PERFLUTREN LIPID MICROSPHERE
1.0000 mL | INTRAVENOUS | Status: AC | PRN
  Administered 2016-05-23: 2 mL via INTRAVENOUS

## 2016-05-30 ENCOUNTER — Encounter: Payer: Self-pay | Admitting: Internal Medicine

## 2016-06-05 ENCOUNTER — Other Ambulatory Visit: Payer: Self-pay | Admitting: Gynecology

## 2016-06-05 ENCOUNTER — Encounter: Payer: Self-pay | Admitting: Internal Medicine

## 2016-06-05 ENCOUNTER — Ambulatory Visit (INDEPENDENT_AMBULATORY_CARE_PROVIDER_SITE_OTHER): Payer: Medicare Other | Admitting: Internal Medicine

## 2016-06-05 VITALS — BP 130/66 | HR 87 | Ht 65.0 in | Wt 176.4 lb

## 2016-06-05 DIAGNOSIS — I5022 Chronic systolic (congestive) heart failure: Secondary | ICD-10-CM

## 2016-06-05 DIAGNOSIS — Z01812 Encounter for preprocedural laboratory examination: Secondary | ICD-10-CM

## 2016-06-05 LAB — CBC WITH DIFFERENTIAL/PLATELET
Basophils Absolute: 0 cells/uL (ref 0–200)
Basophils Relative: 0 %
Eosinophils Absolute: 152 cells/uL (ref 15–500)
Eosinophils Relative: 2 %
HCT: 37 % (ref 35.0–45.0)
Hemoglobin: 12.2 g/dL (ref 11.7–15.5)
Lymphocytes Relative: 23 %
Lymphs Abs: 1748 cells/uL (ref 850–3900)
MCH: 32.2 pg (ref 27.0–33.0)
MCHC: 33 g/dL (ref 32.0–36.0)
MCV: 97.6 fL (ref 80.0–100.0)
MPV: 9.2 fL (ref 7.5–12.5)
Monocytes Absolute: 760 cells/uL (ref 200–950)
Monocytes Relative: 10 %
Neutro Abs: 4940 cells/uL (ref 1500–7800)
Neutrophils Relative %: 65 %
Platelets: 198 10*3/uL (ref 140–400)
RBC: 3.79 MIL/uL — ABNORMAL LOW (ref 3.80–5.10)
RDW: 13 % (ref 11.0–15.0)
WBC: 7.6 10*3/uL (ref 3.8–10.8)

## 2016-06-05 NOTE — Progress Notes (Signed)
HPI Samantha [name] is referred today by Dr. Acie Fredrickson for evaluation of placement of an ICD. She is a very pleasant 74 yo woman with chronic systolic heart failure, EF 30%, despite maximal medical therapy, LBBB, and class 3 systolic symptoms. The patient has never had syncope. She does not have CAD. She has a remote h/o Breast CA.  Allergies  Allergen Reactions  . Codeine     Upset stomach    . Iohexol Hives     Desc: Pt. states she broke out in hives 30 yrs ago w/ a CT Brain scan.  She has since had a heart cath and this CT today (08/10/05) w/o premeds and did well. No evident reaction.  Thanks.   . Shellfish Allergy Itching    Past history of itching with shrimp, has a history of allergy shots , no problem with seafood in years per patient.     Current Outpatient Prescriptions  Medication Sig Dispense Refill  . citalopram (CELEXA) 40 MG tablet Take 40 mg by mouth as needed (depression). Take as directed. Pt not taking regularly    . loratadine (CLARITIN) 10 MG tablet Take 1 tablet (10 mg total) by mouth daily as needed for allergies. 30 tablet 1  . losartan (COZAAR) 25 MG tablet Take 25 mg by mouth daily.    . nebivolol (BYSTOLIC) 5 MG tablet Take 5 mg by mouth daily.    Marland Kitchen rOPINIRole (REQUIP) 0.5 MG tablet Take 1 tablet (0.5 mg total) by mouth at bedtime. 90 tablet 3   No current facility-administered medications for this visit.      Past Medical History:  Diagnosis Date  . Anemia   . Cancer of colon ([place]) 08/2005   STAGE 1 CHEMO AND RADIATION  . CHD (coronary heart disease)   . CHF (congestive heart failure) ([place])   . Colitis   . Conjunctivitis   . Febrile neutropenia ([place])   . Hypertension   . Hypokalemia   . LBBB (left bundle branch block)   . Movement disorder   . Mucositis   . Nonischemic cardiomyopathy ([facility])    EF 30 to 35%  . 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 S/P cardiac cath 2000   Normal coronaries  . Sleep apnea    uses CPAP nightly    ROS:   All  systems reviewed and negative except as noted in the HPI.   Past Surgical History:  Procedure Laterality Date  . APPENDECTOMY    . CARDIAC CATHETERIZATION    . CARPAL TUNNEL RELEASE Left 09/01/2015   Procedure: LEFT CARPAL TUNNEL RELEASE;  Surgeon: Leanora Cover, MD;  Location: [facility];  Service: Orthopedics;  Laterality: Left;  . cataract surgery removal     bilateral  . CESAREAN SECTION    . CHOLECYSTECTOMY    . HEMORROIDECTOMY    . OPEN REDUCTION INTERNAL FIXATION (ORIF) DISTAL RADIAL FRACTURE Left 04/28/2015   Procedure: OPEN REDUCTION INTERNAL FIXATION (ORIF) LEFT DISTAL RADIUS FRACTURE;  Surgeon: Leanora Cover, MD;  Location: [facility];  Service: Orthopedics;  Laterality: Left;  . PORT-A-CATH REMOVAL    . TONSILLECTOMY    . TUBAL LIGATION    . US ECHOCARDIOGRAPHY  3/12   EF 30 to 35%     Family History  Problem Relation Age of Onset  . Diabetes Sister   . Cancer Brother     PROSTATE     Social History   Social History  . Marital  status: Divorced    Spouse name: N/A  . Number of children: 2  . Years of education: 12   Occupational History  .  Retired  . Retired    Social History Main Topics  . Smoking status: Never Smoker  . Smokeless tobacco: Never Used  . Alcohol use No  . Drug use: No  . Sexual activity: No     Comment: 1st intercourse 75 yo-More than 5 partners   Other Topics Concern  . Not on file   Social History Narrative   Patient lives at home alone.    Patient has 2 children.    Patient is retired.    Patient has a high school education.    Patient is divorced.    Patient is right handed.      BP 130/66   Pulse 87   Ht 5\' 5"  (1.651 m)   Wt 176 lb 6.4 oz (80 kg)   BMI 29.35 kg/m   Physical Exam:  Well appearing 75 yo woman, NAD HEENT: Unremarkable Neck:  No JVD, no thyromegally Lymphatics:  No adenopathy Back:  No CVA tenderness Lungs:  Clear with no wheezes HEART:  Regular rate rhythm, no  murmurs, no rubs, no clicks, split S2. Abd:  soft, positive bowel sounds, no organomegally, no rebound, no guarding Ext:  2 plus pulses, no edema, no cyanosis, no clubbing Skin:  No rashes no nodules Neuro:  CN II through XII intact, motor grossly intact  EKG - nsr with LBBB  Assess/Plan: 1. Chronic systolic heart failure - her symptoms are class 3 and she has an EF of 30% by echo. She has a QRS of 135 ms and LBBB. Will plan to proceed with insertion of a BiV ICD. I have discussed the indications, risks/benefits/goals/expectations of the procedure with the patient and she wishes to proceed. 2. HTN - her blood pressure is controlled. Will follow. 3. LBBB - she will undergo a BiV device.  Mikle Bosworth.D.

## 2016-06-05 NOTE — Patient Instructions (Addendum)
Medication Instructions:  Your physician recommends that you continue on your current medications as directed. Please refer to the Current Medication list given to you today.   Labwork: TODAY: BMET / CBC w/ diff   Testing/Procedures: ICD implant  Follow-Up: Follow-up for a wound check in the Device Clinic 10-14 days from 06/15/16  and with Dr. Lovena Le 91 days from 06/15/16     .   Any Other Special Instructions Will Be Listed Below ---  Please wash with the CHG Soap the night before and morning of procedure (follow instruction page "Preparing For Surgery").   Report to the [facility] on 06/15/16 at 10:45 AM  Nothing to eat or drink after midnight the night before procedure  Do not take any medication morning of procedure  Plan 1 night stay    If you need a refill on your cardiac medications before your next appointment, please call your pharmacy.

## 2016-06-06 LAB — BASIC METABOLIC PANEL
BUN: 19 mg/dL (ref 7–25)
CO2: 27 mmol/L (ref 20–31)
Calcium: 9 mg/dL (ref 8.6–10.4)
Chloride: 105 mmol/L (ref 98–110)
Creat: 1.17 mg/dL — ABNORMAL HIGH (ref 0.60–0.93)
Glucose, Bld: 137 mg/dL — ABNORMAL HIGH (ref 65–99)
Potassium: 3.9 mmol/L (ref 3.5–5.3)
Sodium: 142 mmol/L (ref 135–146)

## 2016-06-06 NOTE — Telephone Encounter (Signed)
Okay for Valtrex 500 mg twice a day 5 days at onset of lesion #10  refill 2

## 2016-06-06 NOTE — Telephone Encounter (Signed)
Received requests for Valtrex refill. I do not see where we treated her with this before. If you can check and see what she is using it for and if we prescribed this previously

## 2016-06-06 NOTE — Telephone Encounter (Signed)
Dr. Mancel Bale her historical meds you have prescribed it 7 times since 10/08/2011 with most recent being 10/31/2015.  I called patient to ask what she uses it for. She said she has a bump that comes up on her buttock that itches. I asked her who diagnosed it and started treating her with this and she said she thought it was you but so long ago she cannot be sure who it was.

## 2016-06-15 ENCOUNTER — Encounter (HOSPITAL_COMMUNITY)
Admission: RE | Disposition: A | Discharge status: Home / Self Care | Payer: Self-pay | Source: Ambulatory Visit | Attending: Internal Medicine

## 2016-06-15 ENCOUNTER — Ambulatory Visit (HOSPITAL_COMMUNITY)
Admission: RE | Admit: 2016-06-15 | Discharge: 2016-06-16 | Disposition: A | Discharge status: Home / Self Care | Payer: Medicare Other | Source: Ambulatory Visit | Attending: Internal Medicine | Admitting: Internal Medicine

## 2016-06-15 DIAGNOSIS — Z853 Personal history of malignant neoplasm of breast: Secondary | ICD-10-CM | POA: Insufficient documentation

## 2016-06-15 DIAGNOSIS — Z79899 Other long term (current) drug therapy: Secondary | ICD-10-CM | POA: Diagnosis not present

## 2016-06-15 DIAGNOSIS — I447 Left bundle-branch block, unspecified: Secondary | ICD-10-CM

## 2016-06-15 DIAGNOSIS — G4733 Obstructive sleep apnea (adult) (pediatric): Secondary | ICD-10-CM | POA: Insufficient documentation

## 2016-06-15 DIAGNOSIS — Z006 Encounter for examination for normal comparison and control in clinical research program: Secondary | ICD-10-CM | POA: Insufficient documentation

## 2016-06-15 DIAGNOSIS — G259 Extrapyramidal and movement disorder, unspecified: Secondary | ICD-10-CM | POA: Insufficient documentation

## 2016-06-15 DIAGNOSIS — I5022 Chronic systolic (congestive) heart failure: Secondary | ICD-10-CM | POA: Diagnosis not present

## 2016-06-15 DIAGNOSIS — Z85038 Personal history of other malignant neoplasm of large intestine: Secondary | ICD-10-CM | POA: Diagnosis not present

## 2016-06-15 DIAGNOSIS — I110 Hypertensive heart disease with heart failure: Secondary | ICD-10-CM | POA: Diagnosis not present

## 2016-06-15 DIAGNOSIS — Z923 Personal history of irradiation: Secondary | ICD-10-CM | POA: Insufficient documentation

## 2016-06-15 DIAGNOSIS — Z9221 Personal history of antineoplastic chemotherapy: Secondary | ICD-10-CM | POA: Diagnosis not present

## 2016-06-15 DIAGNOSIS — I428 Other cardiomyopathies: Secondary | ICD-10-CM | POA: Insufficient documentation

## 2016-06-15 DIAGNOSIS — Z95810 Presence of automatic (implantable) cardiac defibrillator: Secondary | ICD-10-CM

## 2016-06-15 DIAGNOSIS — I429 Cardiomyopathy, unspecified: Secondary | ICD-10-CM | POA: Diagnosis not present

## 2016-06-15 HISTORY — PX: EP IMPLANTABLE DEVICE: SHX172B

## 2016-06-15 HISTORY — DX: Presence of automatic (implantable) cardiac defibrillator: Z95.810

## 2016-06-15 HISTORY — DX: Obstructive sleep apnea (adult) (pediatric): G47.33

## 2016-06-15 HISTORY — DX: Chronic systolic (congestive) heart failure: I50.22

## 2016-06-15 LAB — SURGICAL PCR SCREEN
MRSA, PCR: NEGATIVE
Staphylococcus aureus: NEGATIVE

## 2016-06-15 SURGERY — BIV ICD INSERTION CRT-D
Anesthesia: LOCAL

## 2016-06-15 MED ORDER — HEPARIN (PORCINE) IN NACL 2-0.9 UNIT/ML-% IJ SOLN
INTRAMUSCULAR | Status: AC
  Filled 2016-06-15: qty 500

## 2016-06-15 MED ORDER — CITALOPRAM HYDROBROMIDE 20 MG PO TABS
40.0000 mg | ORAL_TABLET | Freq: Every day | ORAL | Status: DC
  Administered 2016-06-15: 40 mg via ORAL
  Filled 2016-06-15 (×2): qty 2

## 2016-06-15 MED ORDER — SODIUM CHLORIDE 0.9 % IR SOLN
Status: AC
  Filled 2016-06-15: qty 2

## 2016-06-15 MED ORDER — LOSARTAN POTASSIUM 25 MG PO TABS
25.0000 mg | ORAL_TABLET | Freq: Every day | ORAL | Status: DC
  Administered 2016-06-15: 25 mg via ORAL
  Filled 2016-06-15 (×2): qty 1

## 2016-06-15 MED ORDER — HYDROCORTISONE ACETATE 25 MG RE SUPP
25.0000 mg | Freq: Every day | RECTAL | Status: DC
  Filled 2016-06-15: qty 1

## 2016-06-15 MED ORDER — METHYLPREDNISOLONE SODIUM SUCC 125 MG IJ SOLR
125.0000 mg | INTRAMUSCULAR | Status: AC
  Administered 2016-06-15: 62.5 mg via INTRAVENOUS

## 2016-06-15 MED ORDER — DIPHENHYDRAMINE HCL 50 MG/ML IJ SOLN
INTRAMUSCULAR | Status: AC
  Administered 2016-06-15: 25 mg via INTRAVENOUS
  Filled 2016-06-15: qty 1

## 2016-06-15 MED ORDER — FAMOTIDINE IN NACL 20-0.9 MG/50ML-% IV SOLN
20.0000 mg | INTRAVENOUS | Status: AC
  Administered 2016-06-15: 20 mg via INTRAVENOUS

## 2016-06-15 MED ORDER — CEFAZOLIN SODIUM-DEXTROSE 2-4 GM/100ML-% IV SOLN
2.0000 g | INTRAVENOUS | Status: AC
  Administered 2016-06-15: 2 g via INTRAVENOUS

## 2016-06-15 MED ORDER — ACETAMINOPHEN 500 MG PO TABS: 500.0000 mg | ORAL_TABLET | Freq: Four times a day (QID) | ORAL | Status: DC | PRN

## 2016-06-15 MED ORDER — LIDOCAINE HCL (PF) 1 % IJ SOLN
INTRAMUSCULAR | Status: AC
  Filled 2016-06-15: qty 30

## 2016-06-15 MED ORDER — HEPARIN (PORCINE) IN NACL 2-0.9 UNIT/ML-% IJ SOLN
INTRAMUSCULAR | Status: DC | PRN
  Administered 2016-06-15: 500 mL

## 2016-06-15 MED ORDER — ROPINIROLE HCL 1 MG PO TABS: 0.5000 mg | ORAL_TABLET | Freq: Every day | ORAL | Status: DC

## 2016-06-15 MED ORDER — FENTANYL CITRATE (PF) 100 MCG/2ML IJ SOLN
INTRAMUSCULAR | Status: DC | PRN
  Administered 2016-06-15 (×8): 12.5 ug via INTRAVENOUS

## 2016-06-15 MED ORDER — CEFAZOLIN IN D5W 1 GM/50ML IV SOLN
1.0000 g | Freq: Four times a day (QID) | INTRAVENOUS | Status: AC
  Administered 2016-06-15 – 2016-06-16 (×3): 1 g via INTRAVENOUS
  Filled 2016-06-15 (×4): qty 50

## 2016-06-15 MED ORDER — IOPAMIDOL (ISOVUE-370) INJECTION 76%
INTRAVENOUS | Status: AC
  Filled 2016-06-15: qty 50

## 2016-06-15 MED ORDER — CEFAZOLIN SODIUM-DEXTROSE 2-4 GM/100ML-% IV SOLN
INTRAVENOUS | Status: AC
  Filled 2016-06-15: qty 100

## 2016-06-15 MED ORDER — SODIUM CHLORIDE 0.9 % IV SOLN
INTRAVENOUS | Status: DC
  Administered 2016-06-15: 11:00:00 via INTRAVENOUS

## 2016-06-15 MED ORDER — IOPAMIDOL (ISOVUE-370) INJECTION 76%
INTRAVENOUS | Status: DC | PRN
  Administered 2016-06-15: 7 mL via INTRAVENOUS

## 2016-06-15 MED ORDER — CHLORHEXIDINE GLUCONATE 4 % EX LIQD: 60.0000 mL | Freq: Once | CUTANEOUS | Status: DC

## 2016-06-15 MED ORDER — ACETAMINOPHEN 325 MG PO TABS: 325.0000 mg | ORAL_TABLET | ORAL | Status: DC | PRN

## 2016-06-15 MED ORDER — METHYLPREDNISOLONE SODIUM SUCC 125 MG IJ SOLR
INTRAMUSCULAR | Status: AC
  Administered 2016-06-15: 62.5 mg via INTRAVENOUS
  Filled 2016-06-15: qty 2

## 2016-06-15 MED ORDER — MUPIROCIN 2 % EX OINT
TOPICAL_OINTMENT | CUTANEOUS | Status: AC
  Filled 2016-06-15: qty 22

## 2016-06-15 MED ORDER — ONDANSETRON HCL 4 MG/2ML IJ SOLN: 4.0000 mg | Freq: Four times a day (QID) | INTRAMUSCULAR | Status: DC | PRN

## 2016-06-15 MED ORDER — DIPHENHYDRAMINE HCL 25 MG PO CAPS
25.0000 mg | ORAL_CAPSULE | Freq: Every day | ORAL | Status: DC
  Filled 2016-06-15 (×3): qty 1

## 2016-06-15 MED ORDER — NEBIVOLOL HCL 5 MG PO TABS
5.0000 mg | ORAL_TABLET | Freq: Every day | ORAL | Status: DC
  Administered 2016-06-15: 5 mg via ORAL
  Filled 2016-06-15 (×2): qty 1

## 2016-06-15 MED ORDER — FENTANYL CITRATE (PF) 100 MCG/2ML IJ SOLN
INTRAMUSCULAR | Status: AC
  Filled 2016-06-15: qty 2

## 2016-06-15 MED ORDER — MIDAZOLAM HCL 5 MG/5ML IJ SOLN
INTRAMUSCULAR | Status: AC
  Filled 2016-06-15: qty 5

## 2016-06-15 MED ORDER — SODIUM CHLORIDE 0.9 % IR SOLN
80.0000 mg | Status: AC
  Administered 2016-06-15: 80 mg

## 2016-06-15 MED ORDER — FAMOTIDINE IN NACL 20-0.9 MG/50ML-% IV SOLN
INTRAVENOUS | Status: AC
  Administered 2016-06-15: 20 mg via INTRAVENOUS
  Filled 2016-06-15: qty 50

## 2016-06-15 MED ORDER — MUPIROCIN 2 % EX OINT
1.0000 "application " | TOPICAL_OINTMENT | Freq: Once | CUTANEOUS | Status: AC
  Administered 2016-06-15: 11:00:00 via TOPICAL
  Filled 2016-06-15: qty 22

## 2016-06-15 MED ORDER — DIPHENHYDRAMINE HCL 50 MG/ML IJ SOLN
25.0000 mg | INTRAMUSCULAR | Status: AC
  Administered 2016-06-15: 25 mg via INTRAVENOUS

## 2016-06-15 MED ORDER — MIDAZOLAM HCL 5 MG/5ML IJ SOLN
INTRAMUSCULAR | Status: DC | PRN
  Administered 2016-06-15 (×8): 1 mg via INTRAVENOUS

## 2016-06-15 MED ORDER — LIDOCAINE HCL (PF) 1 % IJ SOLN
INTRAMUSCULAR | Status: DC | PRN
  Administered 2016-06-15: 45 mL

## 2016-06-15 SURGICAL SUPPLY — 15 items
CABLE SURGICAL S-101-97-12 (CABLE) ×1 IMPLANT
CATH ACUITYPRO 45CM EH 9F (CATHETERS) ×1 IMPLANT
CATH ACUITYPRO 45CM H 9F (CATHETERS) ×1 IMPLANT
CATH HEX JOS 2-5-2 65CM 6F REP (CATHETERS) ×1 IMPLANT
ICD VIGILANT DF4 G247 (ICD Generator) ×1 IMPLANT
INGEVITY MRI 7740-45CM (Lead) ×2 IMPLANT
LEAD ACUITY X4 4677 (Lead) ×1 IMPLANT
LEAD PACING INGEVITY MRI 45CM (Lead) IMPLANT
LEAD RELIANCE G DF4 0292 (Lead) ×1 IMPLANT
PAD DEFIB LIFELINK (PAD) ×2 IMPLANT
SHEATH CLASSIC 7F (SHEATH) ×1 IMPLANT
SHEATH CLASSIC 9.5F (SHEATH) ×2 IMPLANT
SHEATH CLASSIC 9F (SHEATH) ×2 IMPLANT
TRAY PACEMAKER INSERTION (PACKS) ×1 IMPLANT
WIRE MAILMAN 182CM (WIRE) ×1 IMPLANT

## 2016-06-15 NOTE — H&P (Signed)
ICD Criteria  Current LVEF:30%. Within 12 months prior to implant: Yes   Heart failure history: Yes, Class III  Cardiomyopathy history: Yes, Non-Ischemic Cardiomyopathy.  Atrial Fibrillation/Atrial Flutter: No.  Ventricular tachycardia history: No.  Cardiac arrest history: No.  History of syndromes with risk of sudden death: No.  Previous ICD: No.  Current ICD indication: Primary  PPM indication: No.   Class I or II Bradycardia indication present: No  Beta Blocker therapy for 3 or more months: Yes, prescribed.   Ace Inhibitor/ARB therapy for 3 or more months: Yes, prescribed.

## 2016-06-15 NOTE — Discharge Instructions (Signed)
° ° °  Supplemental Discharge Instructions for  Pacemaker/Defibrillator Patients  Activity No heavy lifting or vigorous activity with your left/right arm for 6 to 8 weeks.  Do not raise your left/right arm above your head for one week.  Gradually raise your affected arm as drawn below.           __     06/19/16                          06/20/16                      06/21/16                       06/22/16  NO DRIVING for   1 week  ; you may begin driving on   [id number]  .  WOUND CARE - Keep the wound area clean and dry.  Do not get this area wet for one week. No showers for one week; you may shower on   06/22/16  . - The tape/steri-strips on your wound will fall off; do not pull them off.  No bandage is needed on the site.  DO  NOT apply any creams, oils, or ointments to the wound area. - If you notice any drainage or discharge from the wound, any swelling or bruising at the site, or you develop a fever > 101? F after you are discharged home, call the office at once.  Special Instructions - You are still able to use cellular telephones; use the ear opposite the side where you have your pacemaker/defibrillator.  Avoid carrying your cellular phone near your device. - When traveling through airports, show security personnel your identification card to avoid being screened in the metal detectors.  Ask the security personnel to use the hand wand. - Avoid arc welding equipment, TENS units (transcutaneous nerve stimulators).  Call the office for questions about other devices. - Avoid electrical appliances that are in poor condition or are not properly grounded. - Microwave ovens are safe to be near or to operate.  Additional information for defibrillator patients should your device go off: - If your device goes off ONCE and you feel fine afterward, notify the device clinic nurses. - If your device goes off ONCE and you do not feel well afterward, call 911. - If your device goes off TWICE, call 911. - If your  device goes off THREE times in one day, call 911.  DO NOT DRIVE YOURSELF OR A FAMILY MEMBER WITH A DEFIBRILLATOR TO THE HOSPITAL--CALL 911.

## 2016-06-15 NOTE — H&P (View-Only) (Signed)
HPI Samantha [name] is referred today by Dr. Acie Fredrickson for evaluation of placement of an ICD. She is a very pleasant 75 yo woman with chronic systolic heart failure, EF 30%, despite maximal medical therapy, LBBB, and class 3 systolic symptoms. The patient has never had syncope. She does not have CAD. She has a remote h/o Breast CA.  Allergies  Allergen Reactions  . Codeine     Upset stomach    . Iohexol Hives     Desc: Pt. states she broke out in hives 30 yrs ago w/ a CT Brain scan.  She has since had a heart cath and this CT today (08/10/05) w/o premeds and did well. No evident reaction.  Thanks.   . Shellfish Allergy Itching    Past history of itching with shrimp, has a history of allergy shots , no problem with seafood in years per patient.     Current Outpatient Prescriptions  Medication Sig Dispense Refill  . citalopram (CELEXA) 40 MG tablet Take 40 mg by mouth as needed (depression). Take as directed. Pt not taking regularly    . loratadine (CLARITIN) 10 MG tablet Take 1 tablet (10 mg total) by mouth daily as needed for allergies. 30 tablet 1  . losartan (COZAAR) 25 MG tablet Take 25 mg by mouth daily.    . nebivolol (BYSTOLIC) 5 MG tablet Take 5 mg by mouth daily.    Marland Kitchen rOPINIRole (REQUIP) 0.5 MG tablet Take 1 tablet (0.5 mg total) by mouth at bedtime. 90 tablet 3   No current facility-administered medications for this visit.      Past Medical History:  Diagnosis Date  . Anemia   . Cancer of colon ([place]) 08/2005   STAGE 1 CHEMO AND RADIATION  . CHD (coronary heart disease)   . CHF (congestive heart failure) ([place])   . Colitis   . Conjunctivitis   . Febrile neutropenia ([place])   . Hypertension   . Hypokalemia   . LBBB (left bundle branch block)   . Movement disorder   . Mucositis   . Nonischemic cardiomyopathy ([facility])    EF 30 to 35%  . Renal artery aneurysm ([place])   . S/P cardiac cath 2000   Normal coronaries  . Sleep apnea    uses CPAP nightly    ROS:   All  systems reviewed and negative except as noted in the HPI.   Past Surgical History:  Procedure Laterality Date  . APPENDECTOMY    . CARDIAC CATHETERIZATION    . CARPAL TUNNEL RELEASE Left 09/01/2015   Procedure: LEFT CARPAL TUNNEL RELEASE;  Surgeon: Leanora Cover, MD;  Location: [facility];  Service: Orthopedics;  Laterality: Left;  . cataract surgery removal     bilateral  . CESAREAN SECTION    . CHOLECYSTECTOMY    . HEMORROIDECTOMY    . OPEN REDUCTION INTERNAL FIXATION (ORIF) DISTAL RADIAL FRACTURE Left 04/28/2015   Procedure: OPEN REDUCTION INTERNAL FIXATION (ORIF) LEFT DISTAL RADIUS FRACTURE;  Surgeon: Leanora Cover, MD;  Location: [facility];  Service: Orthopedics;  Laterality: Left;  . PORT-A-CATH REMOVAL    . TONSILLECTOMY    . TUBAL LIGATION    . US ECHOCARDIOGRAPHY  3/12   EF 30 to 35%     Family History  Problem Relation Age of Onset  . Diabetes Sister   . Cancer Brother     PROSTATE     Social History   Social History  . Marital  status: Divorced    Spouse name: N/A  . Number of children: 2  . Years of education: 12   Occupational History  .  Retired  . Retired    Social History Main Topics  . Smoking status: Never Smoker  . Smokeless tobacco: Never Used  . Alcohol use No  . Drug use: No  . Sexual activity: No     Comment: 1st intercourse 75 yo-More than 5 partners   Other Topics Concern  . Not on file   Social History Narrative   Patient lives at home alone.    Patient has 2 children.    Patient is retired.    Patient has a high school education.    Patient is divorced.    Patient is right handed.      BP 130/66   Pulse 87   Ht 5\' 5"  (1.651 m)   Wt 176 lb 6.4 oz (80 kg)   BMI 29.35 kg/m   Physical Exam:  Well appearing 75 yo woman, NAD HEENT: Unremarkable Neck:  No JVD, no thyromegally Lymphatics:  No adenopathy Back:  No CVA tenderness Lungs:  Clear with no wheezes HEART:  Regular rate rhythm, no  murmurs, no rubs, no clicks, split S2. Abd:  soft, positive bowel sounds, no organomegally, no rebound, no guarding Ext:  2 plus pulses, no edema, no cyanosis, no clubbing Skin:  No rashes no nodules Neuro:  CN II through XII intact, motor grossly intact  EKG - nsr with LBBB  Assess/Plan: 1. Chronic systolic heart failure - her symptoms are class 3 and she has an EF of 30% by echo. She has a QRS of 135 ms and LBBB. Will plan to proceed with insertion of a BiV ICD. I have discussed the indications, risks/benefits/goals/expectations of the procedure with the patient and she wishes to proceed. 2. HTN - her blood pressure is controlled. Will follow. 3. LBBB - she will undergo a BiV device.  Mikle Bosworth.D.

## 2016-06-15 NOTE — Discharge Summary (Signed)
ELECTROPHYSIOLOGY PROCEDURE DISCHARGE SUMMARY    [name],  MRN: [id number], DOB/AGE: [birth year] 75 y.o.  Admit date: 06/15/2016 Discharge date: 06/16/2016  Primary Care Physician: Haywood Pao, MD Primary Cardiologist: N[place]ser Electrophysiologist: Lovena Le  Primary Discharge Diagnosis:  NICM, chronic systolic heart failure and LBBB status post CRTD implant this admission  Secondary Discharge Diagnosis:  1.  Prior breast cancer 2.  Prior colon cancer 3.  Hypertension 4.  OSA on CPAP   Procedures This Admission:  1.  Implantation of a BSX CRTD on 06/15/16 by Dr Lovena Le. See op note for full details. There were no immediate post procedure complications. 2.  CXR on 06/16/16 demonstrated no pneumothorax status post device implantation.   Brief HPI: Samantha [name] is a 75 y.o. female with chronic systolic CHF, colon CA s/p radiation/chemo, smooth/normal cors 2000, OSA on CPAP, and LBBB who was referred to electrophysiology in Samantha outpatient setting for consideration of ICD implantation.  Samantha [name] has persistent LV dysfunction despite guideline directed therapy.  EF was 30% by recent echo 05/23/16.  Risks, benefits, and alternatives to ICD implantation were reviewed with Samantha [name] who wished to proceed.   Hospital Course:  Samantha [name] was admitted and underwent implantation of a BSX CRTD with details as outlined above. She was monitored on telemetry overnight which remained stable.  Left chest was without hematoma or ecchymosis.  Samantha device was interrogated and found to be functioning normally.  CXR was obtained and demonstrated no pneumothorax status post device implantation.  Wound care, arm mobility, and restrictions were reviewed with Samantha [name].  Samantha [name] was examined and considered stable for discharge to home.   Samantha [name]'s discharge medications include an ARB (Cozaar) and beta blocker (Bysotlic).   Physical Exam: Vitals:   06/15/16 1815  06/15/16 1830 06/15/16 2100 06/16/16 0441  BP: (!) 117/49 (!) 118/49 (!) 109/48 (!) 122/51  Pulse: 74 81 76 70  Resp:   20 20  Temp:   98.2 F (36.8 C) 98.7 F (37.1 C)  TempSrc:   Oral Oral  SpO2: 92% 92% 95% 99%  Weight:      Height:        GEN- Samantha [name] is well appearing, alert and oriented x 3 today.   HEENT: normocephalic, atraumatic; sclera clear, conjunctiva pink; hearing intact; oropharynx clear; neck supple  Lungs- Clear to ausculation bilaterally, normal work of breathing.  No wheezes, rales, rhonchi Heart- Regular rate and rhythm (paced) GI- soft, non-tender, non-distended, bowel sounds present  Extremities- no clubbing, cyanosis, or edema  MS- no significant deformity or atrophy Skin- warm and dry, no rash or lesion, left chest without hematoma/ecchymosis Psych- euthymic mood, full affect Neuro- strength and sensation are intact   Labs:   Lab Results  Component Value Date   WBC 7.6 06/05/2016   HGB 12.2 06/05/2016   HCT 37.0 06/05/2016   MCV 97.6 06/05/2016   PLT 198 06/05/2016     Recent Labs Lab 06/16/16 0500  NA 138  K 4.4  CL 107  CO2 20*  BUN 23*  CREATININE 0.83  CALCIUM 9.2  GLUCOSE 131*    Discharge Medications:  Allergies as of 06/16/2016      Reactions   Codeine    Upset stomach    Iohexol Hives    Desc: Pt. states she broke out in hives 30 yrs ago w/ a CT Brain scan.  She has since had a heart cath and this CT  today (08/10/05) w/o premeds and did well. No evident reaction.  Thanks.   Shellfish Allergy Itching   Past history of itching with shrimp, has a history of allergy shots , no problem with seafood in years per [name].      Medication List    TAKE these medications   acetaminophen 500 MG tablet Commonly known as:  TYLENOL Take 500 mg by mouth every 6 (six) hours as needed for mild pain.   citalopram 40 MG tablet Commonly known as:  CELEXA Take 40 mg by mouth daily.   diphenhydrAMINE 25 MG tablet Commonly known as:   BENADRYL Take 25 mg by mouth daily.   GLUCOSAMINE-CHONDROITIN PO Take 1 tablet by mouth daily.   hydrocortisone 25 MG suppository Commonly known as:  ANUSOL-HC Place 25 mg rectally at bedtime.   losartan 25 MG tablet Commonly known as:  COZAAR Take 25 mg by mouth daily.   nebivolol 5 MG tablet Commonly known as:  BYSTOLIC Take 5 mg by mouth daily.   PROBIOTIC PO Take 1 tablet by mouth daily.   rOPINIRole 0.5 MG tablet Commonly known as:  REQUIP Take 1 tablet (0.5 mg total) by mouth at bedtime.       Disposition:  Discharge Instructions    Diet - low sodium heart healthy    Complete by:  As directed    Increase activity slowly    Complete by:  As directed    Please see attached sheet at Samantha end of your After-Visit Summary for instructions on wound care, activity, and bathing.     Follow-up Information    [facility].   Specialty:  Cardiology Why:  Your wound check is scheduled on 06/27/16 at 4pm. Your follow-up with Dr. Lovena Le is being scheduled and Samantha office Aurther Harlin call you with that information. Contact information: [address], [place] [phone]          Duration of Discharge Encounter: Greater than 30 minutes including physician time.  Signed, Chanetta Marshall, NP  Samantha discharge summary started by Chanetta Marshall was updated with Samantha most up to date pertinent information. Dr. Curt Bears reviewed EKG and felt QT prolongation was 2/2 Samantha [name] pacing. He did not wish to make any med changes at this time. Dr. Curt Bears has seen and examined Samantha [name] today and feels she is stable for discharge. Wound check f/u already arranged. Sent message to EP scheduler to arrange EP F/u appt with Dr. Lovena Le as well. Dayna Dunn PA-C 06/16/2016 9:41 AM    I have seen and examined this [name] with Melina Copa.  Agree with above, note added to reflect my findings.  On exam, regular rhythm, no murmurs, lungs  clear. Had CRT-D placed. Interrogation stable, CXR without abnormality. Plan for discharge today with follow up in device clinic.    Dani Wallner M. Cuinn Westerhold MD 06/16/2016 9:45 AM

## 2016-06-15 NOTE — Interval H&P Note (Signed)
History and Physical Interval Note:  06/15/2016 2:14 PM  Samantha [name]  has presented today for surgery, with the diagnosis of class three heart failure, nonischemic cardiomyopathy, ef 30%  The various methods of treatment have been discussed with the patient and family. After consideration of risks, benefits and other options for treatment, the patient has consented to  Procedure(s): BiV ICD Insertion CRT-D (N/A) as a surgical intervention .  The patient's history has been reviewed, patient examined, no change in status, stable for surgery.  I have reviewed the patient's chart and labs.  Questions were answered to the patient's satisfaction.     Mikle Bosworth.D.

## 2016-06-16 ENCOUNTER — Ambulatory Visit (HOSPITAL_COMMUNITY): Payer: Medicare Other

## 2016-06-16 ENCOUNTER — Encounter (HOSPITAL_COMMUNITY): Payer: Self-pay | Admitting: Physician Assistant

## 2016-06-16 DIAGNOSIS — I110 Hypertensive heart disease with heart failure: Secondary | ICD-10-CM | POA: Diagnosis not present

## 2016-06-16 DIAGNOSIS — I429 Cardiomyopathy, unspecified: Secondary | ICD-10-CM | POA: Diagnosis not present

## 2016-06-16 DIAGNOSIS — I428 Other cardiomyopathies: Secondary | ICD-10-CM | POA: Diagnosis not present

## 2016-06-16 DIAGNOSIS — Z95810 Presence of automatic (implantable) cardiac defibrillator: Secondary | ICD-10-CM | POA: Diagnosis not present

## 2016-06-16 DIAGNOSIS — G259 Extrapyramidal and movement disorder, unspecified: Secondary | ICD-10-CM | POA: Diagnosis not present

## 2016-06-16 DIAGNOSIS — I5022 Chronic systolic (congestive) heart failure: Secondary | ICD-10-CM | POA: Diagnosis not present

## 2016-06-16 DIAGNOSIS — I447 Left bundle-branch block, unspecified: Secondary | ICD-10-CM | POA: Diagnosis not present

## 2016-06-16 DIAGNOSIS — Z006 Encounter for examination for normal comparison and control in clinical research program: Secondary | ICD-10-CM | POA: Diagnosis not present

## 2016-06-16 LAB — BASIC METABOLIC PANEL
Anion gap: 11 (ref 5–15)
BUN: 23 mg/dL — ABNORMAL HIGH (ref 6–20)
CO2: 20 mmol/L — ABNORMAL LOW (ref 22–32)
Calcium: 9.2 mg/dL (ref 8.9–10.3)
Chloride: 107 mmol/L (ref 101–111)
Creatinine, Ser: 0.83 mg/dL (ref 0.44–1.00)
GFR calc Af Amer: >60 mL/min (ref >60)
GFR calc non Af Amer: >60 mL/min (ref >60)
Glucose, Bld: 131 mg/dL — ABNORMAL HIGH (ref 65–99)
Potassium: 4.4 mmol/L (ref 3.5–5.1)
Sodium: 138 mmol/L (ref 135–145)

## 2016-06-16 LAB — MAGNESIUM: Magnesium: 2.1 mg/dL (ref 1.7–2.4)

## 2016-06-16 NOTE — Significant Event (Signed)
Cardiology Cross Cover Note  Received page that QTc prolonged on AM ECG-- I discontinued prn Zofran and have ordered electrolytes to be followed up.   Zorita Pang, MD 06/16/2016, 4:33 AM

## 2016-06-16 NOTE — Progress Notes (Signed)
Discharged to home with family office visits in place teaching done  

## 2016-06-19 ENCOUNTER — Encounter (HOSPITAL_COMMUNITY): Payer: Self-pay | Admitting: Internal Medicine

## 2016-06-20 DIAGNOSIS — M7051 Other bursitis of knee, right knee: Secondary | ICD-10-CM | POA: Diagnosis not present

## 2016-06-20 DIAGNOSIS — Z6829 Body mass index (BMI) 29.0-29.9, adult: Secondary | ICD-10-CM | POA: Diagnosis not present

## 2016-06-27 ENCOUNTER — Encounter: Payer: Self-pay | Admitting: Internal Medicine

## 2016-06-27 ENCOUNTER — Ambulatory Visit (INDEPENDENT_AMBULATORY_CARE_PROVIDER_SITE_OTHER): Payer: Medicare Other | Admitting: *Deleted

## 2016-06-27 ENCOUNTER — Ambulatory Visit (HOSPITAL_COMMUNITY)
Admission: RE | Admit: 2016-06-27 | Discharge: 2016-06-27 | Disposition: A | Discharge status: Home / Self Care | Payer: Medicare Other | Source: Ambulatory Visit | Attending: Internal Medicine | Admitting: Internal Medicine

## 2016-06-27 DIAGNOSIS — Z95810 Presence of automatic (implantable) cardiac defibrillator: Secondary | ICD-10-CM | POA: Diagnosis not present

## 2016-06-27 DIAGNOSIS — I447 Left bundle-branch block, unspecified: Secondary | ICD-10-CM

## 2016-06-27 DIAGNOSIS — I5022 Chronic systolic (congestive) heart failure: Secondary | ICD-10-CM

## 2016-06-27 DIAGNOSIS — R0602 Shortness of breath: Secondary | ICD-10-CM | POA: Diagnosis not present

## 2016-06-28 LAB — CUP PACEART INCLINIC DEVICE CHECK
Battery Remaining Longevity: 120 mo
Brady Statistic RA Percent Paced: 1 % — CL
Brady Statistic RV Percent Paced: 7 %
Date Time Interrogation Session: 20180111164245
Implantable Lead Implant Date: 20171229
Implantable Lead Implant Date: 20171229
Implantable Lead Implant Date: 20171229
Implantable Lead Location: 753858
Implantable Lead Location: 753859
Implantable Lead Location: 753860
Implantable Lead Model: 292
Implantable Lead Model: 4677
Implantable Lead Model: 7740
Implantable Lead Serial Number: 423617
Implantable Lead Serial Number: 604122
Implantable Lead Serial Number: 686437
Implantable Pulse Generator Implant Date: 20171229
Lead Channel Pacing Threshold Amplitude: 1 V
Lead Channel Pacing Threshold Amplitude: 1 V
Lead Channel Pacing Threshold Amplitude: 2.6 V
Lead Channel Pacing Threshold Pulse Width: 0.4 ms
Lead Channel Pacing Threshold Pulse Width: 0.4 ms
Lead Channel Pacing Threshold Pulse Width: 1.2 ms
Lead Channel Sensing Intrinsic Amplitude: 11.6 mV
Lead Channel Sensing Intrinsic Amplitude: 2.9 mV
Lead Channel Sensing Intrinsic Amplitude: 25 mV
Lead Channel Setting Pacing Amplitude: 3.5 V
Lead Channel Setting Pacing Amplitude: 3.5 V
Lead Channel Setting Pacing Amplitude: 3.5 V
Lead Channel Setting Pacing Pulse Width: 0.4 ms
Lead Channel Setting Pacing Pulse Width: 0.8 ms
Pulse Gen Serial Number: 169900

## 2016-06-28 NOTE — Progress Notes (Signed)
Wound check appointment. Steri-strips removed. Wound without redness or edema. Incision edges approximated, wound well healed. Normal device function. RA/RV thresholds, sensing, and impedances consistent with implant measurements. Elevated LV threshold--now 3.5V@0 .92ms (was 0.7V@0 .5ms). LV config changed from LV2-LV3 to LV1-Can (ok per JA), CXR ordered. Device programmed at 3.5V for extra safety margin until 3 month visit. Histogram distribution appropriate for patient and level of activity. No mode switches or ventricular arrhythmias noted. Patient educated about wound care, arm mobility, lifting restrictions, shock plan. ROV in 3 months with GT.

## 2016-06-29 ENCOUNTER — Telehealth: Payer: Self-pay | Admitting: Internal Medicine

## 2016-06-29 NOTE — Telephone Encounter (Signed)
Patient tells me she was calling in to see if test results were available.  She was given preliminary results and understands office will call back once reviewed by Dr. Lovena Le.  CXR was performed secondary to c/o left sided pain w/ SOB. Pt reports she has not had reoccurrence of pain since last Sunday.  Advised to call office if pain returns.  She is agreeable to plan and thanks me for calling and helping.

## 2016-06-29 NOTE — Telephone Encounter (Signed)
Follow Up:    Pt saiid she was here Wednesday for a wound check.She informed the young lady that she wanted to talk to Dr Lovena Le. She said she was told he was not here, but she would tell him to give her a call. Pt says she still have not heard from Dr Lovena Le or nobody else.l.

## 2016-06-29 NOTE — Telephone Encounter (Signed)
Wants to speak to Dr Lovena Le

## 2016-07-10 ENCOUNTER — Telehealth: Payer: Self-pay | Admitting: *Deleted

## 2016-07-10 ENCOUNTER — Telehealth: Payer: Self-pay

## 2016-07-10 NOTE — Telephone Encounter (Signed)
Called, spoke with pt. Informed most recent chest x-ray results. Dr. Lovena Le stated - "Her lead has not moved fluoroscopically" . Pt stated she had a sharp chest pain prior to chest x-ray. Pt stated she has not had any further CP. Informed to call our office if symptoms return. Pt verbalized understanding and thanked me for calling.

## 2016-07-10 NOTE — Telephone Encounter (Signed)
Informed patient of cxr results. Patient voiced understanding.

## 2016-07-10 NOTE — Telephone Encounter (Signed)
LMTCB/sss  Called to inform patient of cxr results.

## 2016-07-10 NOTE — Telephone Encounter (Signed)
Follow up ° ° ° ° ° °Returning a call to the nurse °

## 2016-07-10 NOTE — Telephone Encounter (Signed)
-----   Message from Iven Finn, LPN sent at 075-GRM 10:17 AM EST ----- Regarding: FW: CXR RESULTS Dr. Lovena Le reviewed. He stated her lead has not moved fluoroscopically.  Thanks,  Renae    ----- Message ----- From: Shiela Mayer, CMA Sent: 06/28/2016   4:33 PM To: Iven Finn, LPN Subject: CXR RESULTS                                    Hey!!  Can you please have GT review this patient's cxr tomorrow and call her with the results? I will be out of the office tomorrow, but  I sent her for a cxr d/t increased LV threshold.    Thanks,  Memory Dance

## 2016-07-30 DIAGNOSIS — D225 Melanocytic nevi of trunk: Secondary | ICD-10-CM | POA: Diagnosis not present

## 2016-07-30 DIAGNOSIS — L570 Actinic keratosis: Secondary | ICD-10-CM | POA: Diagnosis not present

## 2016-07-30 DIAGNOSIS — L814 Other melanin hyperpigmentation: Secondary | ICD-10-CM | POA: Diagnosis not present

## 2016-07-30 DIAGNOSIS — L821 Other seborrheic keratosis: Secondary | ICD-10-CM | POA: Diagnosis not present

## 2016-08-06 ENCOUNTER — Telehealth: Payer: Self-pay | Admitting: Internal Medicine

## 2016-08-06 NOTE — Telephone Encounter (Signed)
Called pt and informed her that her transmission was received there were no events recorded, informed pt that her HR was around 99-100, in a normal rhythm, pt stated that she had taken all her medications today. Was unable to give pt further recommendation before pt hung up the phone.

## 2016-08-06 NOTE — Telephone Encounter (Signed)
Called pt and requested that she send in a manual transmission, pt stated that she would send one, informed her that it would be reviewed and she would receive a call back. Pt voiced understanding.

## 2016-08-06 NOTE — Telephone Encounter (Signed)
New message      Patient c/o Palpitations:  High priority if patient c/o lightheadedness and shortness of breath.  1. How long have you been having palpitations?  today 2. Are you currently experiencing lightheadedness and shortness of breath?  no  3. Have you checked your BP and heart rate? (document readings)  This am 167/69 HR 99, most recent today 122/60 HR 97 4. Are you experiencing any other symptoms?  No Pt got defibulator put in 06-17-17.  Pt want to know if we are getting any readings on her defibulator?

## 2016-08-17 ENCOUNTER — Telehealth: Payer: Self-pay | Admitting: *Deleted

## 2016-08-17 NOTE — Telephone Encounter (Signed)
Spoke to patient regarding a hard heartbeat/thumping coming from her chest. Patient is unable to describe the sensation or say whether or not she believes it's the device that's causing it. Patient's transmission from today was reviewed. Presenting rhythm: AsLVp. No arrhythmias noted. Normal device function.  Spoke to [employer], BSX who agreed to check patient's device in her home to see if any adjustments needed to be made.   Patient gave me permission to send Joey her telephone number. She said that she would prefer if he stopped by tomorrow.   Joey Deakins, BSX aware.

## 2016-08-20 ENCOUNTER — Telehealth: Payer: Self-pay

## 2016-08-20 NOTE — Telephone Encounter (Signed)
Called pt. Informed pt is scheduled tomorrow (08/21/16) at 3:30 PM with Dr. Lovena Le. Pt verbalized understanding.

## 2016-08-21 ENCOUNTER — Encounter: Payer: Self-pay | Admitting: Internal Medicine

## 2016-08-21 ENCOUNTER — Ambulatory Visit (INDEPENDENT_AMBULATORY_CARE_PROVIDER_SITE_OTHER): Payer: Medicare Other | Admitting: Internal Medicine

## 2016-08-21 ENCOUNTER — Other Ambulatory Visit: Payer: Self-pay

## 2016-08-21 DIAGNOSIS — I5022 Chronic systolic (congestive) heart failure: Secondary | ICD-10-CM | POA: Diagnosis not present

## 2016-08-21 LAB — CUP PACEART INCLINIC DEVICE CHECK
Brady Statistic RA Percent Paced: 1 % — CL
Brady Statistic RV Percent Paced: 1 % — CL
Date Time Interrogation Session: 20180306050000
HighPow Impedance: 67 Ohm
HighPow Impedance: 69 Ohm
Implantable Lead Implant Date: 20171229
Implantable Lead Implant Date: 20171229
Implantable Lead Implant Date: 20171229
Implantable Lead Location: 753858
Implantable Lead Location: 753859
Implantable Lead Location: 753860
Implantable Lead Model: 292
Implantable Lead Model: 4677
Implantable Lead Model: 7740
Implantable Lead Serial Number: 423617
Implantable Lead Serial Number: 604122
Implantable Lead Serial Number: 686437
Implantable Pulse Generator Implant Date: 20171229
Lead Channel Impedance Value: 525 Ohm
Lead Channel Impedance Value: 666 Ohm
Lead Channel Impedance Value: 722 Ohm
Lead Channel Pacing Threshold Amplitude: 0.8 V
Lead Channel Pacing Threshold Amplitude: 0.9 V
Lead Channel Pacing Threshold Pulse Width: 0.4 ms
Lead Channel Pacing Threshold Pulse Width: 0.4 ms
Lead Channel Sensing Intrinsic Amplitude: 25 mV
Lead Channel Sensing Intrinsic Amplitude: 5.2 mV
Lead Channel Setting Pacing Amplitude: 2 V
Lead Channel Setting Pacing Amplitude: 2.5 V
Lead Channel Setting Pacing Pulse Width: 0.4 ms
Lead Channel Setting Sensing Sensitivity: 0.5 mV
Lead Channel Setting Sensing Sensitivity: 1 mV
Pulse Gen Serial Number: 169900

## 2016-08-21 NOTE — Patient Instructions (Addendum)
Medication Instructions:  Your physician recommends that you continue on your current medications as directed. Please refer to the Current Medication list given to you today.   Labwork: None Ordered   Testing/Procedures: None Ordered   Follow-Up: Cancel appointment on 09/26/16  Your physician wants you to follow-up in: 9 months with Dr. Lovena Le. You will receive a reminder letter in the mail two months in advance. If you don't receive a letter, please call our office to schedule the follow-up appointment.  Remote monitoring is used to monitor your ICD from home. This monitoring reduces the number of office visits required to check your device to one time per year. It allows [place] to keep an eye on the functioning of your device to ensure it is working properly. You are scheduled for a device check from home on 11/20/16. You may send your transmission at any time that day. If you have a wireless device, the transmission will be sent automatically. After your physician reviews your transmission, you will receive a postcard with your next transmission date.    Any Other Special Instructions Will Be Listed Below (If Applicable).     If you need a refill on your cardiac medications before your next appointment, please call your pharmacy.

## 2016-08-21 NOTE — Progress Notes (Signed)
HPI Samantha [name] returns today  for ongoing evaluation of her ICD. She is a very pleasant 76 yo woman with chronic systolic heart failure, EF 30%, despite maximal medical therapy, LBBB, and class 3 systolic symptoms. She underwent BiV ICD implant  2 months ago. She had some diaphragmatic stimulation that we were able to program around but this worsened. Her LV lead was turned off 3 days ago. She feels better sensing. She has not had any ICD shocks.  Allergies  Allergen Reactions  . Codeine     Upset stomach    . Iohexol Hives     Desc: Pt. states she broke out in hives 30 yrs ago w/ a CT Brain scan.  She has since had a heart cath and this CT today (08/10/05) w/o premeds and did well. No evident reaction.  Thanks.   . Shellfish Allergy Itching    Past history of itching with shrimp, has a history of allergy shots , no problem with seafood in years per patient.     Current Outpatient Prescriptions  Medication Sig Dispense Refill  . losartan (COZAAR) 25 MG tablet Take 25 mg by mouth daily.    . nebivolol (BYSTOLIC) 5 MG tablet Take 5 mg by mouth daily.    . Omega-3 1000 MG CAPS Take 1,000 mg by mouth daily.    . Probiotic Product (PROBIOTIC PO) Take 1 tablet by mouth daily.    Marland Kitchen rOPINIRole (REQUIP) 0.5 MG tablet Take 1 tablet (0.5 mg total) by mouth at bedtime. 90 tablet 3  . acetaminophen (TYLENOL) 500 MG tablet Take 500 mg by mouth every 6 (six) hours as needed for mild pain.    . citalopram (CELEXA) 40 MG tablet Take 40 mg by mouth daily.     . diphenhydrAMINE (BENADRYL) 25 MG tablet Take 25 mg by mouth daily as needed for allergies.     Marland Kitchen GLUCOSAMINE-CHONDROITIN PO Take 1 tablet by mouth daily.    . hydrocortisone (ANUSOL-HC) 25 MG suppository Place 25 mg rectally at bedtime.     No current facility-administered medications for this visit.      Past Medical History:  Diagnosis Date  . Anemia   . 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 Cancer of colon ([place]) 08/2005   STAGE 1 CHEMO AND RADIATION  . Chronic systolic CHF (congestive heart failure) ([facility])   . Colitis   . Febrile neutropenia ([place])   . Hypertension   . Hypokalemia   . LBBB (left bundle branch block)   . Movement disorder   . Mucositis   . Nonischemic cardiomyopathy ([place])    a. prior varying EF, 30% in 2017. s/p BSX CRTD 05/2016.  Marland Kitchen OSA (obstructive sleep apnea)   . 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 S/P cardiac cath 2000   Normal coronaries  . Sleep apnea    uses CPAP nightly    ROS:   All systems reviewed and negative except as noted in the HPI.   Past Surgical History:  Procedure Laterality Date  . APPENDECTOMY    . CARDIAC CATHETERIZATION    . CARPAL TUNNEL RELEASE Left 09/01/2015   Procedure: LEFT CARPAL TUNNEL RELEASE;  Surgeon: Leanora Cover, MD;  Location: [facility];  Service: Orthopedics;  Laterality: Left;  . cataract surgery removal     bilateral  . CESAREAN SECTION    . CHOLECYSTECTOMY    . EP IMPLANTABLE DEVICE N/A 06/15/2016   Procedure:  BiV ICD Insertion CRT-D;  Surgeon: Evans Lance, MD;  Location: [facility];  Service: Cardiovascular;  Laterality: N/A;  . HEMORROIDECTOMY    . OPEN REDUCTION INTERNAL FIXATION (ORIF) DISTAL RADIAL FRACTURE Left 04/28/2015   Procedure: OPEN REDUCTION INTERNAL FIXATION (ORIF) LEFT DISTAL RADIUS FRACTURE;  Surgeon: Leanora Cover, MD;  Location: [facility];  Service: Orthopedics;  Laterality: Left;  . PORT-A-CATH REMOVAL    . TONSILLECTOMY    . TUBAL LIGATION    . US ECHOCARDIOGRAPHY  3/12   EF 30 to 35%     Family History  Problem Relation Age of Onset  . Diabetes Sister   . Cancer Brother     PROSTATE     Social History   Social History  . Marital status: Divorced    Spouse name: N/A  . Number of children: 2  . Years of education: 12   Occupational History  .  Retired  . Retired    Social History Main Topics  . Smoking status: Never  Smoker  . Smokeless tobacco: Never Used  . Alcohol use No  . Drug use: No  . Sexual activity: No     Comment: 1st intercourse 76 yo-More than 5 partners   Other Topics Concern  . Not on file   Social History Narrative   Patient lives at home alone.    Patient has 2 children.    Patient is retired.    Patient has a high school education.    Patient is divorced.    Patient is right handed.      BP 130/68   Pulse 87   Ht 5\' 5"  (1.651 m)   Wt 180 lb (81.6 kg)   SpO2 97%   BMI 29.95 kg/m   Physical Exam:  Well appearing 76 yo woman, NAD HEENT: Unremarkable Neck:  No JVD, no thyromegally Lymphatics:  No adenopathy Back:  No CVA tenderness Lungs:  Clear with no wheezes HEART:  Regular rate rhythm, no murmurs, no rubs, no clicks, split S2. Abd:  soft, positive bowel sounds, no organomegally, no rebound, no guarding Ext:  2 plus pulses, no edema, no cyanosis, no clubbing Skin:  No rashes no nodules Neuro:  CN II through XII intact, motor grossly intact  EKG - nsr with LBBB  Assess/Plan: 1. Chronic systolic heart failure - her symptoms are improved. I have asked her to undergo watchful waiting with her her pacing rate reduced to 40/min. We would hope to allow her to conduct. We discussed revising her LV lead and will hold off on this for now.  2. HTN - her blood pressure is controlled. Will follow. 3. Her LV pacing threshold is greater than her diaphragmatic stimulation threshold. I will hold off on repeating her CXR.  Mikle Bosworth.D.

## 2016-08-22 ENCOUNTER — Other Ambulatory Visit: Payer: Self-pay | Admitting: Internal Medicine

## 2016-09-05 ENCOUNTER — Telehealth: Payer: Self-pay | Admitting: Neurology

## 2016-09-05 NOTE — Telephone Encounter (Signed)
Patient called office in reference to her CPAP disk stating it is full and will pop out.  Patient would like to know if she is able to come by the office to drop off her chip for a download.  Please call

## 2016-09-05 NOTE — Telephone Encounter (Signed)
I called pt. We do not fix cpap machines. She will need to bring her cpap to her DME. They will be able to download and fix the cpap if needed.  No answer, left a message asking her to call me back.

## 2016-09-06 NOTE — Telephone Encounter (Signed)
I called pt again to discuss. No answer, left a message asking her to call me back. 

## 2016-09-10 NOTE — Telephone Encounter (Signed)
This is my third attempt at calling pt, will send her a letter in the mail asking her to call me back.

## 2016-09-10 NOTE — Telephone Encounter (Signed)
I called pt again to discuss. No answer, left a message asking her to call me back. 

## 2016-09-20 ENCOUNTER — Encounter: Payer: Self-pay | Admitting: Physician Assistant

## 2016-09-21 ENCOUNTER — Encounter: Payer: Self-pay | Admitting: Physician Assistant

## 2016-09-26 ENCOUNTER — Encounter: Payer: Medicare Other | Admitting: Internal Medicine

## 2016-10-03 ENCOUNTER — Ambulatory Visit (INDEPENDENT_AMBULATORY_CARE_PROVIDER_SITE_OTHER): Payer: Medicare Other | Admitting: Nurse Practitioner

## 2016-10-03 ENCOUNTER — Encounter: Payer: Self-pay | Admitting: Nurse Practitioner

## 2016-10-03 VITALS — BP 132/74 | HR 76 | Ht 65.0 in | Wt 180.4 lb

## 2016-10-03 DIAGNOSIS — I5023 Acute on chronic systolic (congestive) heart failure: Secondary | ICD-10-CM

## 2016-10-03 DIAGNOSIS — I10 Essential (primary) hypertension: Secondary | ICD-10-CM | POA: Diagnosis not present

## 2016-10-03 DIAGNOSIS — I5043 Acute on chronic combined systolic (congestive) and diastolic (congestive) heart failure: Secondary | ICD-10-CM | POA: Diagnosis not present

## 2016-10-03 LAB — CUP PACEART INCLINIC DEVICE CHECK
Date Time Interrogation Session: 20180418124457
Implantable Lead Implant Date: 20171229
Implantable Lead Implant Date: 20171229
Implantable Lead Implant Date: 20171229
Implantable Lead Location: 753858
Implantable Lead Location: 753859
Implantable Lead Location: 753860
Implantable Lead Model: 292
Implantable Lead Model: 4677
Implantable Lead Model: 7740
Implantable Lead Serial Number: 423617
Implantable Lead Serial Number: 604122
Implantable Lead Serial Number: 686437
Implantable Pulse Generator Implant Date: 20171229
Pulse Gen Serial Number: 169900

## 2016-10-03 MED ORDER — CARVEDILOL 12.5 MG PO TABS: 12.5000 mg | ORAL_TABLET | Freq: Two times a day (BID) | ORAL | 3 refills | Status: DC

## 2016-10-03 MED ORDER — FUROSEMIDE 20 MG PO TABS: 20.0000 mg | ORAL_TABLET | Freq: Every day | ORAL | 3 refills | Status: DC

## 2016-10-03 NOTE — Progress Notes (Signed)
Electrophysiology Office Note Date: 10/03/2016  ID:  Samantha [name], DOB 02-May-1941, MRN [id number]  PCP: Samantha Pao, MD Primary Cardiologist: [place] Electrophysiologist: Samantha [name]  CC: add on for shortness of breath, fatigue   Samantha [name] is a 76 y.o. female seen today for Dr Samantha [name].  She underwent CRTD implant in December of 2017 and did well initially with symptomatic improvement. Unfortunately, she developed diaphragmatic stim and her LV lead was turned off. Since that time, she has had worsening shortness of breath, anxiety, and fatigue. She is added on today for further evaluation. She denies chest pain,  PND, orthopnea, nausea, vomiting, dizziness, syncope, edema, weight gain, or early satiety.  She has not had ICD shocks.   Device History: BSX CRTD implanted 2017 for NICM, CHF History of appropriate therapy: No History of AAD therapy: No   Past Medical History:  Diagnosis Date  . Anemia   . 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 Cancer of colon ([place]) 08/2005   STAGE 1 CHEMO AND RADIATION  . Chronic systolic CHF (congestive heart failure) ([place])   . Colitis   . Febrile neutropenia ([place])   . Hypertension   . Hypokalemia   . LBBB (left bundle branch block)   . Movement disorder   . Mucositis   . Nonischemic cardiomyopathy ([place])    a. prior varying EF, 30% in 2017. s/p BSX CRTD 05/2016.  Marland Kitchen OSA (obstructive sleep apnea)   . Renal artery aneurysm ([place])   . S/P cardiac cath 2000   Normal coronaries  . Sleep apnea    uses CPAP nightly   Past Surgical History:  Procedure Laterality Date  . APPENDECTOMY    . CARDIAC CATHETERIZATION    . CARPAL TUNNEL RELEASE Left 09/01/2015   Procedure: LEFT CARPAL TUNNEL RELEASE;  Surgeon: Samantha Cover, MD;  Location: [facility];  Service: Orthopedics;  Laterality: Left;  . cataract surgery removal     bilateral  . CESAREAN SECTION    . CHOLECYSTECTOMY    . EP IMPLANTABLE  DEVICE N/A 06/15/2016   Procedure: BiV ICD Insertion CRT-D;  Surgeon: Samantha Lance, MD;  Location: [facility];  Service: Cardiovascular;  Laterality: N/A;  . HEMORROIDECTOMY    . OPEN REDUCTION INTERNAL FIXATION (ORIF) DISTAL RADIAL FRACTURE Left 04/28/2015   Procedure: OPEN REDUCTION INTERNAL FIXATION (ORIF) LEFT DISTAL RADIUS FRACTURE;  Surgeon: Samantha Cover, MD;  Location: [facility];  Service: Orthopedics;  Laterality: Left;  . PORT-A-CATH REMOVAL    . TONSILLECTOMY    . TUBAL LIGATION    . US ECHOCARDIOGRAPHY  3/12   EF 30 to 35%    Current Outpatient Prescriptions  Medication Sig Dispense Refill  . acetaminophen (TYLENOL) 500 MG tablet Take 500 mg by mouth every 6 (six) hours as needed for mild pain.    . citalopram (CELEXA) 40 MG tablet Take 40 mg by mouth daily.     . diphenhydrAMINE (BENADRYL) 25 MG tablet Take 25 mg by mouth daily as needed for allergies.     Marland Kitchen GLUCOSAMINE-CHONDROITIN PO Take 1 tablet by mouth daily.    Marland Kitchen losartan (COZAAR) 25 MG tablet Take 25 mg by mouth daily.    . Omega-3 1000 MG CAPS Take 1,000 mg by mouth daily.    . Probiotic Product (PROBIOTIC PO) Take 1 tablet by mouth daily.    Marland Kitchen rOPINIRole (REQUIP) 0.5 MG tablet Take 1 tablet (0.5 mg total) by mouth at  bedtime. 90 tablet 3  . carvedilol (COREG) 12.5 MG tablet Take 1 tablet (12.5 mg total) by mouth 2 (two) times daily. 180 tablet 3  . furosemide (LASIX) 20 MG tablet Take 1 tablet (20 mg total) by mouth daily. 90 tablet 3   No current facility-administered medications for this visit.     Allergies:   Codeine; Iohexol; and Shellfish allergy   Social History: Social History   Social History  . Marital status: Divorced    Spouse name: N/A  . Number of children: 2  . Years of education: 12   Occupational History  .  Retired  . Retired    Social History Main Topics  . Smoking status: Never Smoker  . Smokeless tobacco: Never Used  . Alcohol use No  . Drug use: No  .  Sexual activity: No     Comment: 1st intercourse 76 yo-More than 5 partners   Other Topics Concern  . Not on file   Social History Narrative   Patient lives at home alone.    Patient has 2 children.    Patient is retired.    Patient has a high school education.    Patient is divorced.    Patient is right handed.     Family History: Family History  Problem Relation Age of Onset  . Diabetes Sister   . Cancer Brother     PROSTATE    Review of Systems: All other systems reviewed and are otherwise negative except as noted above.   Physical Exam: VS:  BP 132/74   Pulse 76   Ht 5\' 5"  (1.651 m)   Wt 180 lb 6.4 oz (81.8 kg)   BMI 30.02 kg/m  , BMI Body mass index is 30.02 kg/m.  GEN- The patient is elderly appearing, alert and oriented x 3 today but with short term memory loss HEENT: normocephalic, atraumatic; sclera clear, conjunctiva pink; hearing intact; oropharynx clear; neck supple  Lungs- Clear to ausculation bilaterally, normal work of breathing.  No wheezes, rales, rhonchi Heart- Regular rate and rhythm  GI- soft, non-tender, non-distended, bowel sounds present  Extremities- no clubbing, cyanosis, or edema  MS- no significant deformity or atrophy Skin- warm and dry, no rash or lesion; ICD pocket well healed Psych- +anxious Neuro- strength and sensation are intact  ICD interrogation- reviewed in detail today,  See PACEART report  EKG:  EKG is ordered today. The ekg ordered today shows sinus rhythm, LBBB, QRS 116msec  Recent Labs: 06/05/2016: Hemoglobin 12.2; Platelets 198 06/16/2016: BUN 23; Creatinine, Ser 0.83; Magnesium 2.1; Potassium 4.4; Sodium 138   Wt Readings from Last 3 Encounters:  10/03/16 180 lb 6.4 oz (81.8 kg)  08/21/16 180 lb (81.6 kg)  06/15/16 174 lb (78.9 kg)     Other studies Reviewed: Additional studies/ records that were reviewed today include: hospital records, Dr Samantha [name] and Dr Elmarie Shiley office notes  Assessment and Plan:  1.   Acute on chronic systolic dysfunction Symptomatically worsened after turning LV lead off She has acceptable LV threshold with vectors LV1-can (1.6V@0 .32msec) and also LV 1-LV4 (2.2V@0 .89msec).  All other vectors have thresholds >3.5V.  When testing LV thresholds in the testing screen, she has no diaphragmatic stim with either one of these vectors at <3.5V.  When I program her device to LV pace with either of these vectors, then she has clear diaphragmatic stim at 2.2V. I isolated the RV lead today to be sure that wasn't causing diaphragmatic stim.  She has no stim  with RV only pacing.   Vector, pulse width, outputs are all the same as testing screen.  I have spent >50 minutes today trying to understand why she would have diaphragmatic stim with programming but not with testing.  I have spoken with our local [place] as well as Industrial/product designer at [employer], none of whom can explain why she would have diaphragmatic stim when programmed but not testing.   As she was improved with CRT, I think that we should continue to try to figure out if there is a way to program her device so she can receive benefit.  There is always the option of repositioning the lead, however, would like to avoid another procedure if possible.  Tech services recommends a data dump from the device when she returns next Monday to send to the engineers so that they can look into further.  For now, I am adding Lasix 20mg  daily and changing Bystolic to Coreg.  Will increase Coreg if able on Monday BMET, BNP, CBC today   2.  HTN Stable No change required today  I have spent >50 minutes with the patient today on the above.    Current medicines are reviewed at length with the patient today.   The patient does not have concerns regarding her medicines.  The following changes were made today:  Start Lasix 20mg  daily, stop Bystolic, start Coreg 12.5mg  twice daily  Labs/ tests ordered today include:  Orders Placed  This Encounter  Procedures  . Basic Metabolic Panel (BMET)  . CBC  . Pro b natriuretic peptide  . CUP PACEART [place]  . EKG 12-Lead     Disposition:   Follow up with Dr [address] on Monday    Signed, Wednesday, NP 10/03/2016 2:49 PM  [facility] [address] [place] [address] [phone] (office) [phone] (fax)

## 2016-10-03 NOTE — Patient Instructions (Signed)
Medication Instructions:  Your physician has recommended you make the following change in your medication: Please stop your BYSTOLIC. Please start the Coreg 12.5 twice a day. And the Lasix 20 mg daily.   Labwork: Your physician recommends that you return for lab work today for BMET, CBC, PRO BNP  Testing/Procedures: None Ordered   Follow-Up:   Any Other Special Instructions Will Be Listed Below (If Applicable).     If you need a refill on your cardiac medications before your next appointment, please call your pharmacy.

## 2016-10-04 LAB — BASIC METABOLIC PANEL
BUN/Creatinine Ratio: 19 (ref 12–28)
BUN: 17 mg/dL (ref 8–27)
CO2: 25 mmol/L (ref 18–29)
Calcium: 10 mg/dL (ref 8.7–10.3)
Chloride: 100 mmol/L (ref 96–106)
Creatinine, Ser: 0.91 mg/dL (ref 0.57–1.00)
GFR calc Af Amer: 71 mL/min/{1.73_m2} (ref >59)
GFR calc non Af Amer: 62 mL/min/{1.73_m2} (ref >59)
Glucose: 92 mg/dL (ref 65–99)
Potassium: 4.8 mmol/L (ref 3.5–5.2)
Sodium: 141 mmol/L (ref 134–144)

## 2016-10-04 LAB — CBC
Hematocrit: 38.9 % (ref 34.0–46.6)
Hemoglobin: 13 g/dL (ref 11.1–15.9)
MCH: 32 pg (ref 26.6–33.0)
MCHC: 33.4 g/dL (ref 31.5–35.7)
MCV: 96 fL (ref 79–97)
Platelets: 239 10*3/uL (ref 150–379)
RBC: 4.06 x10E6/uL (ref 3.77–5.28)
RDW: 13 % (ref 12.3–15.4)
WBC: 7.7 10*3/uL (ref 3.4–10.8)

## 2016-10-04 LAB — PRO B NATRIURETIC PEPTIDE: NT-Pro BNP: 510 pg/mL (ref 0–738)

## 2016-10-04 NOTE — Addendum Note (Signed)
Addended by: Patsey Berthold on: 10/04/2016 08:08 AM   Modules accepted: Orders

## 2016-10-08 ENCOUNTER — Ambulatory Visit (INDEPENDENT_AMBULATORY_CARE_PROVIDER_SITE_OTHER): Payer: Medicare Other | Admitting: Internal Medicine

## 2016-10-08 ENCOUNTER — Encounter: Payer: Self-pay | Admitting: Internal Medicine

## 2016-10-08 ENCOUNTER — Encounter: Payer: Medicare Other | Admitting: Physician Assistant

## 2016-10-08 ENCOUNTER — Ambulatory Visit (HOSPITAL_COMMUNITY)
Admission: RE | Admit: 2016-10-08 | Discharge: 2016-10-08 | Disposition: A | Discharge status: Home / Self Care | Payer: Medicare Other | Source: Ambulatory Visit | Attending: Nurse Practitioner | Admitting: Nurse Practitioner

## 2016-10-08 VITALS — BP 120/68 | HR 68 | Ht 65.0 in | Wt 180.5 lb

## 2016-10-08 DIAGNOSIS — T82897A Other specified complication of cardiac prosthetic devices, implants and grafts, initial encounter: Secondary | ICD-10-CM | POA: Insufficient documentation

## 2016-10-08 DIAGNOSIS — I5023 Acute on chronic systolic (congestive) heart failure: Secondary | ICD-10-CM

## 2016-10-08 DIAGNOSIS — Z950 Presence of cardiac pacemaker: Secondary | ICD-10-CM | POA: Diagnosis not present

## 2016-10-08 DIAGNOSIS — Z95810 Presence of automatic (implantable) cardiac defibrillator: Secondary | ICD-10-CM

## 2016-10-08 DIAGNOSIS — Y838 Other surgical procedures as the cause of abnormal reaction of the patient, or of later complication, without mention of misadventure at the time of the procedure: Secondary | ICD-10-CM | POA: Insufficient documentation

## 2016-10-08 DIAGNOSIS — I447 Left bundle-branch block, unspecified: Secondary | ICD-10-CM

## 2016-10-08 DIAGNOSIS — T82198A Other mechanical complication of other cardiac electronic device, initial encounter: Secondary | ICD-10-CM | POA: Diagnosis present

## 2016-10-08 NOTE — Patient Instructions (Addendum)
Medication Instructions:  Your physician recommends that you continue on your current medications as directed. Please refer to the Current Medication list given to you today.   Labwork: None   Testing/Procedures: Dr Lovena Le could do device lead replacement on May 4, May 9 or May 11, June 5, June 6, June 12, June 20, June 26.   Follow-Up: Your physician recommends that you schedule a follow-up appointment in: 3 months with Dr Lovena Le     Low-Sodium Eating Plan Sodium, which is an element that makes up salt, helps you maintain a healthy balance of fluids in your body. Too much sodium can increase your blood pressure and cause fluid and waste to be held in your body. Your health care provider or dietitian may recommend following this plan if you have high blood pressure (hypertension), kidney disease, liver disease, or heart failure. Eating less sodium can help lower your blood pressure, reduce swelling, and protect your heart, liver, and kidneys. What are tips for following this plan? General guidelines   Most people on this plan should limit their sodium intake to 1,500-2,000 mg (milligrams) of sodium each day. Reading food labels   The Nutrition Facts label lists the amount of sodium in one serving of the food. If you eat more than one serving, you must multiply the listed amount of sodium by the number of servings.  Choose foods with less than 140 mg of sodium per serving.  Avoid foods with 300 mg of sodium or more per serving. Shopping   Look for lower-sodium products, often labeled as "low-sodium" or "no salt added."  Always check the sodium content even if foods are labeled as "unsalted" or "no salt added".  Buy fresh foods.  Avoid canned foods and premade or frozen meals.  Avoid canned, cured, or processed meats  Buy breads that have less than 80 mg of sodium per slice. Cooking   Eat more home-cooked food and less restaurant, buffet, and fast food.  Avoid adding salt  when cooking. Use salt-free seasonings or herbs instead of table salt or sea salt. Check with your health care provider or pharmacist before using salt substitutes.  Cook with plant-based oils, such as canola, sunflower, or olive oil. Meal planning   When eating at a restaurant, ask that your food be prepared with less salt or no salt, if possible.  Avoid foods that contain MSG (monosodium glutamate). MSG is sometimes added to [place] food, bouillon, and some canned foods. What foods are recommended? The items listed may not be a complete list. Talk with your dietitian about what dietary choices are best for you. Grains  Low-sodium cereals, including oats, puffed wheat and rice, and shredded wheat. Low-sodium crackers. Unsalted rice. Unsalted pasta. Low-sodium bread. Whole-grain breads and whole-grain pasta. Vegetables  Fresh or frozen vegetables. "No salt added" canned vegetables. "No salt added" tomato sauce and paste. Low-sodium or reduced-sodium tomato and vegetable juice. Fruits  Fresh, frozen, or canned fruit. Fruit juice. Meats and other protein foods  Fresh or frozen (no salt added) meat, poultry, seafood, and fish. Low-sodium canned tuna and salmon. Unsalted nuts. Dried peas, beans, and lentils without added salt. Unsalted canned beans. Eggs. Unsalted nut butters. Dairy  Milk. Soy milk. Cheese that is naturally low in sodium, such as ricotta cheese, fresh mozzarella, or Swiss cheese Low-sodium or reduced-sodium cheese. Cream cheese. Yogurt. Fats and oils  Unsalted butter. Unsalted margarine with no trans fat. Vegetable oils such as canola or olive oils. Seasonings and other foods  Fresh and dried herbs and spices. Salt-free seasonings. Low-sodium mustard and ketchup. Sodium-free salad dressing. Sodium-free light mayonnaise. Fresh or refrigerated horseradish. Lemon juice. Vinegar. Homemade, reduced-sodium, or low-sodium soups. Unsalted popcorn and pretzels. Low-salt or salt-free  chips. What foods are not recommended? The items listed may not be a complete list. Talk with your dietitian about what dietary choices are best for you. Grains  Instant hot cereals. Bread stuffing, pancake, and biscuit mixes. Croutons. Seasoned rice or pasta mixes. Noodle soup cups. Boxed or frozen macaroni and cheese. Regular salted crackers. Self-rising flour. Vegetables  Sauerkraut, pickled vegetables, and relishes. Olives. [place] fries. Onion rings. Regular canned vegetables (not low-sodium or reduced-sodium). Regular canned tomato sauce and paste (not low-sodium or reduced-sodium). Regular tomato and vegetable juice (not low-sodium or reduced-sodium). Frozen vegetables in sauces. Meats and other protein foods  Meat or fish that is salted, canned, smoked, spiced, or pickled. Bacon, ham, sausage, hotdogs, corned beef, chipped beef, packaged lunch meats, salt pork, jerky, pickled herring, anchovies, regular canned tuna, sardines, salted nuts. Dairy  Processed cheese and cheese spreads. Cheese curds. Blue cheese. Feta cheese. String cheese. Regular cottage cheese. Buttermilk. Canned milk. Fats and oils  Salted butter. Regular margarine. Ghee. Bacon fat. Seasonings and other foods  Onion salt, garlic salt, seasoned salt, table salt, and sea salt. Canned and packaged gravies. Worcestershire sauce. Tartar sauce. Barbecue sauce. Teriyaki sauce. Soy sauce, including reduced-sodium. Steak sauce. Fish sauce. Oyster sauce. Cocktail sauce. Horseradish that you find on the shelf. Regular ketchup and mustard. Meat flavorings and tenderizers. Bouillon cubes. Hot sauce and Tabasco sauce. Premade or packaged marinades. Premade or packaged taco seasonings. Relishes. Regular salad dressings. Salsa. Potato and tortilla chips. Corn chips and puffs. Salted popcorn and pretzels. Canned or dried soups. Pizza. Frozen entrees and pot pies. Summary  Eating less sodium can help lower your blood pressure, reduce swelling,  and protect your heart, liver, and kidneys.  Most people on this plan should limit their sodium intake to 1,500-2,000 mg (milligrams) of sodium each day.  Canned, boxed, and frozen foods are high in sodium. Restaurant foods, fast foods, and pizza are also very high in sodium. You also get sodium by adding salt to food.  Try to cook at home, eat more fresh fruits and vegetables, and eat less fast food, canned, processed, or prepared foods. This information is not intended to replace advice given to you by your health care provider. Make sure you discuss any questions you have with your health care provider. Document Released: 11/24/2001 Document Revised: 05/28/2016 Document Reviewed: 05/28/2016 Elsevier Interactive Patient Education  2017 [employer].        If you need a refill on your cardiac medications before your next appointment, please call your pharmacy. Marland Kitchen

## 2016-10-08 NOTE — Progress Notes (Signed)
HPI Samantha [name] returns today  for ongoing evaluation of her ICD. She is a very pleasant 76 yo woman with chronic systolic heart failure, EF 30%, despite maximal medical therapy, LBBB, and class 3 systolic symptoms. She underwent BiV ICD implant  4 months ago. She had some diaphragmatic stimulation that we were able to program around but this worsened. Her LV lead was turned off. She feels better sensing. She has not had any ICD shocks.   Allergies  Allergen Reactions  . Codeine     Upset stomach    . Iohexol Hives     Desc: Pt. states she broke out in hives 30 yrs ago w/ a CT Brain scan.  She has since had a heart cath and this CT today (08/10/05) w/o premeds and did well. No evident reaction.  Thanks.   . Shellfish Allergy Itching    Past history of itching with shrimp, has a history of allergy shots , no problem with seafood in years per patient.     Current Outpatient Prescriptions  Medication Sig Dispense Refill  . acetaminophen (TYLENOL) 500 MG tablet Take 500 mg by mouth every 6 (six) hours as needed for mild pain.    . carvedilol (COREG) 12.5 MG tablet Take 1 tablet (12.5 mg total) by mouth 2 (two) times daily. 180 tablet 3  . citalopram (CELEXA) 40 MG tablet Take 40 mg by mouth daily.     . diphenhydrAMINE (BENADRYL) 25 MG tablet Take 25 mg by mouth daily as needed for allergies.     . furosemide (LASIX) 20 MG tablet Take 1 tablet (20 mg total) by mouth daily. 90 tablet 3  . GLUCOSAMINE-CHONDROITIN PO Take 1 tablet by mouth daily.    Marland Kitchen losartan (COZAAR) 25 MG tablet Take 25 mg by mouth daily.    . Omega-3 1000 MG CAPS Take 1,000 mg by mouth daily.    . Probiotic Product (PROBIOTIC PO) Take 1 tablet by mouth daily.    Marland Kitchen rOPINIRole (REQUIP) 0.5 MG tablet Take 1 tablet (0.5 mg total) by mouth at bedtime. 90 tablet 3   No current facility-administered medications for this visit.      Past Medical History:  Diagnosis Date  . Anemia   . Biventricular ICD (implantable  cardioverter-defibrillator) in place 05/2016  . Cancer of colon ([place]) 08/2005   STAGE 1 CHEMO AND RADIATION  . Chronic systolic CHF (congestive heart failure) ([place])   . Colitis   . Febrile neutropenia ([place])   . Hypertension   . Hypokalemia   . LBBB (left bundle branch block)   . Movement disorder   . Mucositis   . Nonischemic cardiomyopathy ([place])    a. prior varying EF, 30% in 2017. s/p BSX CRTD 05/2016.  Marland Kitchen OSA (obstructive sleep apnea)   . Renal artery aneurysm ([place])   . S/P cardiac cath 2000   Normal coronaries  . Sleep apnea    uses CPAP nightly    ROS:   All systems reviewed and negative except as noted in the HPI.   Past Surgical History:  Procedure Laterality Date  . APPENDECTOMY    . CARDIAC CATHETERIZATION    . CARPAL TUNNEL RELEASE Left 09/01/2015   Procedure: LEFT CARPAL TUNNEL RELEASE;  Surgeon: Leanora Cover, MD;  Location: [facility];  Service: Orthopedics;  Laterality: Left;  . cataract surgery removal     bilateral  . CESAREAN SECTION    . CHOLECYSTECTOMY    .  EP IMPLANTABLE DEVICE N/A 06/15/2016   Procedure: BiV ICD Insertion CRT-D;  Surgeon: Evans Lance, MD;  Location: [facility];  Service: Cardiovascular;  Laterality: N/A;  . HEMORROIDECTOMY    . OPEN REDUCTION INTERNAL FIXATION (ORIF) DISTAL RADIAL FRACTURE Left 04/28/2015   Procedure: OPEN REDUCTION INTERNAL FIXATION (ORIF) LEFT DISTAL RADIUS FRACTURE;  Surgeon: Leanora Cover, MD;  Location: [facility];  Service: Orthopedics;  Laterality: Left;  . PORT-A-CATH REMOVAL    . TONSILLECTOMY    . TUBAL LIGATION    . US ECHOCARDIOGRAPHY  3/12   EF 30 to 35%     Family History  Problem Relation Age of Onset  . Diabetes Sister   . Cancer Brother     PROSTATE     Social History   Social History  . Marital status: Divorced    Spouse name: N/A  . Number of children: 2  . Years of education: 12   Occupational History  .  Retired  . Retired    Social  History Main Topics  . Smoking status: Never Smoker  . Smokeless tobacco: Never Used  . Alcohol use No  . Drug use: No  . Sexual activity: No     Comment: 1st intercourse 76 yo-More than 5 partners   Other Topics Concern  . Not on file   Social History Narrative   Patient lives at home alone.    Patient has 2 children.    Patient is retired.    Patient has a high school education.    Patient is divorced.    Patient is right handed.      BP 120/68   Pulse 68   Ht 5\' 5"  (1.651 m)   Wt 180 lb 8 oz (81.9 kg)   SpO2 98%   BMI 30.04 kg/m   Physical Exam:  Well appearing 76 yo woman, NAD HEENT: Unremarkable Neck:  6 cm JVD, no thyromegally Lymphatics:  No adenopathy Back:  No CVA tenderness Lungs:  Clear with no wheezes HEART:  Regular rate rhythm, no murmurs, no rubs, no clicks, split S2. Abd:  soft, positive bowel sounds, no organomegally, no rebound, no guarding Ext:  2 plus pulses, no edema, no cyanosis, no clubbing Skin:  No rashes no nodules Neuro:  CN II through XII intact, motor grossly intact  EKG - nsr with LBBB  Assess/Plan: 1. Chronic systolic heart failure - her symptoms are worsened. Her daughter in law who works with [place], thinks that her mother in law is less well. More dyspneic. I have offered her a LV lead revision and she is considering her options.  2. HTN - her blood pressure is controlled. Will follow. 3. Her LV pacing threshold is greater than her diaphragmatic stimulation threshold. Her CXR demonstrates that the lead has backed up about a cm.  4. ICD - I have reviewed the device function which is normal. We have left her programmed VVI today.  Mikle Bosworth.D.

## 2016-10-09 ENCOUNTER — Encounter: Payer: Medicare Other | Admitting: Internal Medicine

## 2016-10-09 ENCOUNTER — Ambulatory Visit: Payer: Medicare Other | Admitting: Physician Assistant

## 2016-10-10 DIAGNOSIS — H1851 Endothelial corneal dystrophy: Secondary | ICD-10-CM | POA: Diagnosis not present

## 2016-10-10 DIAGNOSIS — Z961 Presence of intraocular lens: Secondary | ICD-10-CM | POA: Diagnosis not present

## 2016-10-10 DIAGNOSIS — H47012 Ischemic optic neuropathy, left eye: Secondary | ICD-10-CM | POA: Diagnosis not present

## 2016-10-10 DIAGNOSIS — H52203 Unspecified astigmatism, bilateral: Secondary | ICD-10-CM | POA: Diagnosis not present

## 2016-10-11 ENCOUNTER — Other Ambulatory Visit: Payer: Self-pay | Admitting: Gynecology

## 2016-10-15 NOTE — Telephone Encounter (Signed)
Check with patient. I cannot see where I prescribed this before.

## 2016-10-15 NOTE — Telephone Encounter (Signed)
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

## 2016-10-22 ENCOUNTER — Ambulatory Visit (INDEPENDENT_AMBULATORY_CARE_PROVIDER_SITE_OTHER): Payer: Medicare Other | Admitting: Adult Health

## 2016-10-22 ENCOUNTER — Encounter: Payer: Self-pay | Admitting: Adult Health

## 2016-10-22 VITALS — BP 116/64 | HR 73 | Ht 65.0 in | Wt 179.6 lb

## 2016-10-22 DIAGNOSIS — G4733 Obstructive sleep apnea (adult) (pediatric): Secondary | ICD-10-CM | POA: Diagnosis not present

## 2016-10-22 DIAGNOSIS — G2581 Restless legs syndrome: Secondary | ICD-10-CM

## 2016-10-22 DIAGNOSIS — R413 Other amnesia: Secondary | ICD-10-CM

## 2016-10-22 DIAGNOSIS — Z9989 Dependence on other enabling machines and devices: Secondary | ICD-10-CM

## 2016-10-22 NOTE — Progress Notes (Signed)
PATIENT: Samantha [name] DOB: 01-27-1941  REASON FOR VISIT: follow up- obstructive sleep apnea on CPAP, restless legs, memory disturbance HISTORY FROM: patient  HISTORY OF PRESENT ILLNESS: Today 10/22/16: Samantha [name] is a 76 year old female with a history of obstructive sleep apnea on CPAP, restless legs and memory disturbance. She returns today for follow-up. She feels that Requip is working well for her restless legs. She states that she does use the CPAP nightly. In the past she had good compliance. Fortunately she did not bring her memory card with her today. She states the main thing she notices with her memory is that  she can be asked a question and it may take several hours for to come up with the answer. She lives with her daughter. She is able to complete all ADLs independently. She does operate a motor vehicle with states that she drives very little. She does report in the past she hasn't gotten lost. Denies any changes with her appetite. Reports that she is sleeping okay. She does report anxiety and constant fidgeting of the hands. She returns today for an evaluation.  HISTORY Copied from Dr. Edwena Felty notes: Samantha [name], 76 year old female returns for followup. She was last seen in this office in 11/24/2012- CM -the patient suffers from restless leg syndrome but currently takes ropinirole which has reduced his symptoms to some mild severity. She has a history of restless leg syndrome and and is currently on Requip 0.5 daily which has been very beneficial.  She used the restless leg syndrome rating scale that is associated with the [facility] restless leg syndrome quality of life questionnaire. She states that she has trouble concentrating in the afternoon most of the time concentrating in the evening and that this is due to having restless leg sometimes before she takes her medication at bedtime. She can confirm that she has an irresistible urge to move if she would not take her medication.   Her restless legs have been distressing to her, depression scale was endorsed at 6 points, she confirmed that she feels clinically depressed. Her sleepiness score was endorsed at 4 points only and the fatigue severity score is endorsed at 48 points-  Elevated. Samantha [name] sister was murdered in September of last year, her motor was apprehended and arrested yesterday. She is very emotional about this understandably.  She has a history of sleep apnea which was diagnosed during PSG 08/15/2011. Her download shows a high compliance.  She currently uses a nasal pillow and humidity. She also has insomnia at baseline and uses Sonata when necessary, which is rarely, she had no refill in the last 11 month. She  claims she uses her CPAP every night, she brought her machine in today for download. Used to machine 26 out of 30 days over 4 hours at night her residual AHI is 4.7 the air leak is high to moderately high. The set pressure is  9 cm water with 2 cm EPR.  Interval history from 12-30-14. Samantha [name] is here today after her visit 5 weeks ago to see if he can enroll her into the restless leg study.  She takes Requip 0.5 milligrams at nighttime or at evening time and actually her nocturnal sleep is no longer affected by restless legs - but she continues to be restless and continuously moving throughout the day she could not take a nap because of the restless legs in daytime.  He could also not tolerate the Requip in daytime is causing her  to be sleepy. The questionnaire for Restless legs : IRLS at 19 points.   Interval history from 04/23/2016, at the pleasure of seeing Samantha [name] today for a revisit this is her yearly CPAP compliance visit. She has been highly compliant with 8 hours and 42 minutes of average user time per night, 93 percentile compliance but she has experienced slightly higher apnea numbers at 6.1 residual AHI. Also some air leaks are noted. The restless legs are still present but with medications  very well controlled, her quality of life is not impaired by the degree of restless legs  REVIEW OF SYSTEMS: Out of a complete 14 system review of symptoms, the patient complains only of the following symptoms, and all other reviewed systems are negative.  Frequency of urination, food allergies, memory loss, depression, nervous/anxious, apnea  ALLERGIES: Allergies  Allergen Reactions  . Codeine     Upset stomach    . Iohexol Hives     Desc: Pt. states she broke out in hives 30 yrs ago w/ a CT Brain scan.  She has since had a heart cath and this CT today (08/10/05) w/o premeds and did well. No evident reaction.  Thanks.   . Shellfish Allergy Itching    Past history of itching with shrimp, has a history of allergy shots , no problem with seafood in years per patient.    HOME MEDICATIONS: Outpatient Medications Prior to Visit  Medication Sig Dispense Refill  . acetaminophen (TYLENOL) 500 MG tablet Take 500 mg by mouth every 6 (six) hours as needed for mild pain.    . carvedilol (COREG) 12.5 MG tablet Take 1 tablet (12.5 mg total) by mouth 2 (two) times daily. 180 tablet 3  . citalopram (CELEXA) 40 MG tablet Take 40 mg by mouth daily.     . diphenhydrAMINE (BENADRYL) 25 MG tablet Take 25 mg by mouth 2 (two) times daily as needed for allergies.     . furosemide (LASIX) 20 MG tablet Take 1 tablet (20 mg total) by mouth daily. 90 tablet 3  . GLUCOSAMINE-CHONDROITIN PO Take 1 tablet by mouth daily.    Marland Kitchen losartan (COZAAR) 25 MG tablet Take 25 mg by mouth daily.    . Probiotic Product (PROBIOTIC PO) Take 1 tablet by mouth daily.    Marland Kitchen rOPINIRole (REQUIP) 0.5 MG tablet Take 1 tablet (0.5 mg total) by mouth at bedtime. 90 tablet 3  . valACYclovir (VALTREX) 500 MG tablet TAKE 1 TABLET BY MOUTH TWICE A DAY FOR 5 DAYS 10 tablet 0  . Omega-3 1000 MG CAPS Take 1,000 mg by mouth daily.     No facility-administered medications prior to visit.     PAST MEDICAL HISTORY: Past Medical History:    Diagnosis Date  . Anemia   . 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 Cancer of colon ([place]) 08/2005   STAGE 1 CHEMO AND RADIATION  . Chronic systolic CHF (congestive heart failure) ([place])   . Colitis   . Febrile neutropenia ([place])   . Hypertension   . Hypokalemia   . LBBB (left bundle branch block)   . Movement disorder   . Mucositis   . Nonischemic cardiomyopathy ([place])    a. prior varying EF, 30% in 2017. s/p BSX CRTD 05/2016.  Marland Kitchen OSA (obstructive sleep apnea)   . 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 S/P cardiac cath 2000   Normal coronaries  . Sleep apnea    uses CPAP nightly    PAST  SURGICAL HISTORY: Past Surgical History:  Procedure Laterality Date  . APPENDECTOMY    . CARDIAC CATHETERIZATION    . CARPAL TUNNEL RELEASE Left 09/01/2015   Procedure: LEFT CARPAL TUNNEL RELEASE;  Surgeon: Leanora Cover, MD;  Location: [place];  Service: Orthopedics;  Laterality: Left;  . cataract surgery removal     bilateral  . CESAREAN SECTION    . CHOLECYSTECTOMY    . EP IMPLANTABLE DEVICE N/A 06/15/2016   Procedure: BiV ICD Insertion CRT-D;  Surgeon: Evans Lance, MD;  Location: [place] [facility];  Service: Cardiovascular;  Laterality: N/A;  . HEMORROIDECTOMY    . OPEN REDUCTION INTERNAL FIXATION (ORIF) DISTAL RADIAL FRACTURE Left 04/28/2015   Procedure: OPEN REDUCTION INTERNAL FIXATION (ORIF) LEFT DISTAL RADIUS FRACTURE;  Surgeon: Leanora Cover, MD;  Location: [place];  Service: Orthopedics;  Laterality: Left;  . PORT-A-CATH REMOVAL    . TONSILLECTOMY    . TUBAL LIGATION    . US ECHOCARDIOGRAPHY  3/12   EF 30 to 35%    FAMILY HISTORY: Family History  Problem Relation Age of Onset  . Diabetes Sister   . Cancer Brother     PROSTATE    SOCIAL HISTORY: Social History   Social History  . Marital status: Divorced    Spouse name: N/A  . Number of children: 2  . Years of education: 12   Occupational History   .  Retired  . Retired    Social History Main Topics  . Smoking status: Never Smoker  . Smokeless tobacco: Never Used  . Alcohol use No  . Drug use: No  . Sexual activity: No     Comment: 1st intercourse 76 yo-More than 5 partners   Other Topics Concern  . Not on file   Social History Narrative   Patient lives at home alone.    Patient has 2 children.    Patient is retired.    Patient has a high school education.    Patient is divorced.    Patient is right handed.       PHYSICAL EXAM  Vitals:   10/22/16 1251  BP: 116/64  Pulse: 73  Weight: 179 lb 9.6 oz (81.5 kg)  Height: 5\' 5"  (1.651 m)   Body mass index is 29.89 kg/m.  MMSE - Mini Mental State Exam 10/22/2016  Orientation to time 4  Orientation to Place 5  Registration 3  Attention/ Calculation 5  Recall 0  Language- name 2 objects 2  Language- repeat 1  Language- follow 3 step command 3  Language- read & follow direction 1  Write a sentence 1  Copy design 1  Total score 26     Generalized: Well developed, in no acute distress   Neurological examination  Mentation: Alert oriented to time, place, history taking. Follows all commands speech and language fluent Cranial nerve II-XII: Pupils were equal round reactive to light. Extraocular movements were full, visual field were full on confrontational test. Facial sensation and strength were normal. Uvula tongue midline. Head turning and shoulder shrug  were normal and symmetric. Motor: The motor testing reveals 5 over 5 strength of all 4 extremities. Good symmetric motor tone is noted throughout.  Sensory: Sensory testing is intact to soft touch on all 4 extremities. No evidence of extinction is noted.  Coordination: Cerebellar testing reveals good finger-nose-finger and heel-to-shin bilaterally.  Gait and station: Gait is normal.  Reflexes: Deep tendon reflexes are symmetric and normal bilaterally.  DIAGNOSTIC DATA (LABS, IMAGING, TESTING) - I reviewed  patient records, labs, notes, testing and imaging myself where available.  Lab Results  Component Value Date   WBC 7.7 10/03/2016   HGB 12.2 06/05/2016   HCT 38.9 10/03/2016   MCV 96 10/03/2016   PLT 239 10/03/2016      Component Value Date/Time   NA 141 10/03/2016 1355   NA 141 10/01/2011 1302   K 4.8 10/03/2016 1355   K 4.3 10/01/2011 1302   CL 100 10/03/2016 1355   CL 100 10/01/2011 1302   CO2 25 10/03/2016 1355   CO2 30 10/01/2011 1302   GLUCOSE 92 10/03/2016 1355   GLUCOSE 131 (H) 06/16/2016 0500   GLUCOSE 93 10/01/2011 1302   BUN 17 10/03/2016 1355   BUN 18 10/01/2011 1302   CREATININE 0.91 10/03/2016 1355   CREATININE 1.17 (H) 06/05/2016 1549   CALCIUM 10.0 10/03/2016 1355   CALCIUM 8.8 10/01/2011 1302   PROT 6.8 11/25/2014 1427   PROT 7.2 10/01/2011 1302   ALBUMIN 4.5 11/25/2014 1427   AST 15 11/25/2014 1427   AST 22 10/01/2011 1302   ALT 17 11/25/2014 1427   ALT 23 10/01/2011 1302   ALKPHOS 71 11/25/2014 1427   ALKPHOS 69 10/01/2011 1302   BILITOT 0.3 11/25/2014 1427   BILITOT 0.40 10/01/2011 1302   GFRNONAA 62 10/03/2016 1355   GFRAA 71 10/03/2016 1355   Lab Results  Component Value Date   CHOL 185 03/10/2013   HDL 57.30 03/10/2013   LDLCALC 110 (H) 03/10/2013   TRIG 91.0 03/10/2013   CHOLHDL 3 03/10/2013   No results found for: HGBA1C No results found for: VITAMINB12 Lab Results  Component Value Date   TSH 1.872 11/27/2010      ASSESSMENT AND PLAN 76 y.o. year old female  has a past medical history of Anemia; Biventricular ICD (implantable cardioverter-defibrillator) in place (05/2016); Cancer of colon ([place]) (08/2005); Chronic systolic CHF (congestive heart failure) ([place]); Colitis; Febrile neutropenia ([place]); Hypertension; Hypokalemia; LBBB (left bundle branch block); Movement disorder; Mucositis; Nonischemic cardiomyopathy ([place]); OSA (obstructive sleep apnea); Renal artery aneurysm ([place]); S/P cardiac cath (2000); and Sleep apnea. here  with:  1. Memory disturbance 2. Obstructive sleep apnea on CPAP 3. Restless leg syndrome  The patient did not bring her memory chip today. She has been compliant with past download. The patient's memory score today is 26 out of 30. We will continue to monitor her memory. If her memory score continues to decline we will consider adding a memory medication. She will continue on Requip for restless legs. Patient advised that if her symptoms worsen or she develops any new symptoms she should let us know. She will follow-up in 4 months for another memory test.     Ward Givens, MSN, NP-C 10/22/2016, 1:19 PM [facility] [address] [place], [place] [address] [phone]

## 2016-10-22 NOTE — Patient Instructions (Addendum)
Memory score 26/30- will continue to monitor  Continue to use cpap nightly  Continue Requip for restless leg If your symptoms worsen or you develop new symptoms please let us know.

## 2016-10-23 NOTE — Progress Notes (Signed)
I agree with the assessment and plan as directed by NP .The patient is known to me .   Sharisa Toves, MD  

## 2016-11-05 ENCOUNTER — Encounter: Payer: Self-pay | Admitting: Cardiovascular Disease

## 2016-11-05 ENCOUNTER — Ambulatory Visit (INDEPENDENT_AMBULATORY_CARE_PROVIDER_SITE_OTHER): Payer: Medicare Other | Admitting: Cardiovascular Disease

## 2016-11-05 VITALS — BP 108/64 | HR 88 | Ht 65.0 in | Wt 179.4 lb

## 2016-11-05 DIAGNOSIS — I5022 Chronic systolic (congestive) heart failure: Secondary | ICD-10-CM

## 2016-11-05 DIAGNOSIS — I447 Left bundle-branch block, unspecified: Secondary | ICD-10-CM | POA: Diagnosis not present

## 2016-11-05 NOTE — Progress Notes (Signed)
Cardiology Office Note   Date:  11/05/2016   ID:  Samantha [name], DOB 1940-11-28, MRN [id number]  PCP:  Haywood Pao, MD  Cardiologist:   Mertie Moores, MD   Chief Complaint  Patient presents with  . Follow-up    CHF   1. Congestive heart failure- original EF 30-35%, now EF = 45-50% 2. History colon cancer-status post radiation and chemotherapy-April 2007 3. Smooth and normal coronary arteries by heart catheterization in 2000 4. OSA - wears CPAP      She has done well from a cardiac stand point. She has had some dizziness and found her BP to be low yesterday. She has occasional palpitations and frequently finds that her heart rate is about 100 with minimal exertion.  September 18, 2012:  Samantha [name] is doing well. Still has some dyspnea - especially when bending over or stooping over. No chest pain. She eats an ice cream sundae every day, sometimes twice a day. She still eats fast food almost all of the time. She had an EF of 35% several years ago. Her EF has improved to 45-50% with medical therapy.   Sept. 22, 2014:  Samantha [name] is doing well from a cardiac standpoint. She has been found to have a lung nodule.  She has occasional episodes of dyspnea - especially with exertion. She is still eating salty foods - she salts everything that she eats.   October 05, 2013:  Doing well from a cardiac standpoint. No CP, Does have some dyspnea. Has been out working out in the yard without problems.   Oct. 28, 2015:  Samantha [name] is doing well. She finds occasional elevated BP.  She still eats salt. Which likely explains her elevated BP   Oct 18, 2014:  Samantha [name] is a 76 y.o. female who presents for follow up of her CHF. She has floaters in her visual field this am Breathing is ok. Occasionally gets short of breath.    Nov. 8, 2016: Doing ok.   Has lost weight . Breathing is ok  Oct 31, 2015: Doing well from a cardiac standpoint.  Fatigued   Nov. 13, 2017:     Having problems with nervousness. Has some cold sweats between her shoulders- night sweats. Has lost 17 lbs over the past 6 months  Has had some bleeding in her stool  ( has appt with Dr. Collene Mares tomorrow )   No CP Gets short of breath at times.   Does not get any exercise Last echo was in 2012 - EF 45-50% at that time   Nov 05, 2016:  No cardiac complaints  Her LV lead on her BI-V pacer is now turned off - it had migrated and was diaphragmatic pacing . So will see Dr. Lovena Le in July  Is having some shortness of breath   Is now back on Coreg instead of Bystolic   Still eats salt -   Has had bad diarrhea for the past several weeks   Past Medical History:  Diagnosis Date  . Anemia   . 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 Cancer of colon ([place]) 08/2005   STAGE 1 CHEMO AND RADIATION  . Chronic systolic CHF (congestive heart failure) ([place])   . Colitis   . Febrile neutropenia ([place])   . Hypertension   . Hypokalemia   . LBBB (left bundle branch block)   . Movement disorder   . Mucositis   . Nonischemic cardiomyopathy ([place])    a.  prior varying EF, 30% in 2017. s/p BSX CRTD 05/2016.  Marland Kitchen OSA (obstructive sleep apnea)   . Renal artery aneurysm ([place])   . S/P cardiac cath 2000   Normal coronaries  . Sleep apnea    uses CPAP nightly    Past Surgical History:  Procedure Laterality Date  . APPENDECTOMY    . CARDIAC CATHETERIZATION    . CARPAL TUNNEL RELEASE Left 09/01/2015   Procedure: LEFT CARPAL TUNNEL RELEASE;  Surgeon: Leanora Cover, MD;  Location: [facility];  Service: Orthopedics;  Laterality: Left;  . cataract surgery removal     bilateral  . CESAREAN SECTION    . CHOLECYSTECTOMY    . EP IMPLANTABLE DEVICE N/A 06/15/2016   Procedure: BiV ICD Insertion CRT-D;  Surgeon: Evans Lance, MD;  Location: [facility];  Service: Cardiovascular;  Laterality: N/A;  . HEMORROIDECTOMY    . OPEN REDUCTION INTERNAL  FIXATION (ORIF) DISTAL RADIAL FRACTURE Left 04/28/2015   Procedure: OPEN REDUCTION INTERNAL FIXATION (ORIF) LEFT DISTAL RADIUS FRACTURE;  Surgeon: Leanora Cover, MD;  Location: [place];  Service: Orthopedics;  Laterality: Left;  . PORT-A-CATH REMOVAL    . TONSILLECTOMY    . TUBAL LIGATION    . US ECHOCARDIOGRAPHY  3/12   EF 30 to 35%     Current Outpatient Prescriptions  Medication Sig Dispense Refill  . acetaminophen (TYLENOL) 500 MG tablet Take 500 mg by mouth every 6 (six) hours as needed for mild pain.    . carvedilol (COREG) 12.5 MG tablet Take 1 tablet (12.5 mg total) by mouth 2 (two) times daily. 180 tablet 3  . citalopram (CELEXA) 40 MG tablet Take 40 mg by mouth daily.     . diphenhydrAMINE (BENADRYL) 25 MG tablet Take 25 mg by mouth 2 (two) times daily as needed for allergies.     . furosemide (LASIX) 20 MG tablet Take 1 tablet (20 mg total) by mouth daily. 90 tablet 3  . GLUCOSAMINE-CHONDROITIN PO Take 1 tablet by mouth daily.    Marland Kitchen losartan (COZAAR) 25 MG tablet Take 25 mg by mouth daily.    . Omega-3 1000 MG CAPS Take 1,000 mg by mouth daily.    . Probiotic Product (PROBIOTIC PO) Take 1 tablet by mouth daily.    Marland Kitchen rOPINIRole (REQUIP) 0.5 MG tablet Take 1 tablet (0.5 mg total) by mouth at bedtime. 90 tablet 3  . valACYclovir (VALTREX) 500 MG tablet TAKE 1 TABLET BY MOUTH TWICE A DAY FOR 5 DAYS 10 tablet 0   No current facility-administered medications for this visit.     Allergies:   Codeine; Iohexol; and Shellfish allergy    Social History:  The patient  reports that she has never smoked. She has never used smokeless tobacco. She reports that she does not drink alcohol or use drugs.   Family History:  The patient's family history includes Cancer in her brother; Diabetes in her sister.    ROS:  Please see the history of present illness.    Review of Systems: Constitutional:  denies fever, chills, diaphoresis, appetite change and fatigue.  HEENT:  denies photophobia, eye pain, redness, hearing loss, ear pain, congestion, sore throat, rhinorrhea, sneezing, neck pain, neck stiffness and tinnitus.  Respiratory: denies SOB, DOE, cough, chest tightness, and wheezing.  Cardiovascular: denies chest pain, palpitations and leg swelling.  Gastrointestinal: denies nausea, vomiting, abdominal pain, diarrhea, constipation, blood in stool.  Genitourinary: denies dysuria, urgency, frequency, hematuria, flank pain and difficulty  urinating.  Musculoskeletal: denies  myalgias, back pain, joint swelling, arthralgias and gait problem.   Skin: denies pallor, rash and wound.  Neurological: denies dizziness, seizures, syncope, weakness, light-headedness, numbness and headaches.   Hematological: denies adenopathy, easy bruising, personal or family bleeding history.  Psychiatric/ Behavioral: denies suicidal ideation, mood changes, confusion, nervousness, sleep disturbance and agitation.       All other systems are reviewed and negative.    PHYSICAL EXAM: VS:  BP 108/64 (BP Location: Left Arm, Patient Position: Sitting, Cuff Size: Normal)   Pulse 88   Ht 5\' 5"  (1.651 m)   Wt 179 lb 6.4 oz (81.4 kg)   SpO2 95%   BMI 29.85 kg/m  , BMI Body mass index is 29.85 kg/m. GEN: Well nourished, well developed, in no acute distress  HEENT: normal  Neck: no JVD, carotid bruits, or masses Cardiac: RRR; no murmurs, rubs, or gallops,no edema  Respiratory:  clear to auscultation bilaterally, normal work of breathing GI: soft, nontender, nondistended, + BS MS: no deformity or atrophy  Skin: warm and dry, no rash Neuro:  Strength and sensation are intact Psych: very anxioius    EKG:  EKG is not ordered today.      Recent Labs: 06/05/2016: Hemoglobin 12.2 06/16/2016: Magnesium 2.1 10/03/2016: BUN 17; Creatinine, Ser 0.91; NT-Pro BNP 510; Platelets 239; Potassium 4.8; Sodium 141    Lipid Panel    Component Value Date/Time   CHOL 185 03/10/2013 1142   TRIG  91.0 03/10/2013 1142   HDL 57.30 03/10/2013 1142   CHOLHDL 3 03/10/2013 1142   VLDL 18.2 03/10/2013 1142   LDLCALC 110 (H) 03/10/2013 1142      Wt Readings from Last 3 Encounters:  11/05/16 179 lb 6.4 oz (81.4 kg)  10/22/16 179 lb 9.6 oz (81.5 kg)  10/08/16 180 lb 8 oz (81.9 kg)      Other studies Reviewed: Additional studies/ records that were reviewed today include: . Review of the above records demonstrates:    ASSESSMENT AND PLAN:  1. Congestive heart failure- original EF 30-35%, now EF = 45-50%,    Currently her LV lead is turned off. The LV lead apparently migrated and was pacing her diaphragm. She scheduled to see Dr. Lovena Le in July to discuss redoing her LV lead.  At present she does not have any signs or symptoms of congestive heart failure. We'll continue current medications for now.  2. History colon cancer-status post radiation and chemotherapy-April 2007  3. Smooth and normal coronary arteries by heart catheterization in 2000  4. Anxiety:   Seem Very anxious and tremulous today. She will be seeing her primary medical doctor in several weeks.  5. Night sweats and weight loss: She has a history of colon cancer. She's been having some heme-positive stools. I have advised her to address these with Dr. Collene Mares.  Current medicines are reviewed at length with the patient today.  The patient does not have concerns regarding medicines.  The following changes have been made:  no change  Labs/ tests ordered today include:  No orders of the defined types were placed in this encounter.   Disposition:   FU with me in 6 months      Mertie Moores, MD  11/05/2016 2:02 PM    [facility] [place], [place], [place]  [address] Phone: [phone]; Fax: [phone]

## 2016-11-05 NOTE — Patient Instructions (Signed)

## 2016-11-08 DIAGNOSIS — E559 Vitamin D deficiency, unspecified: Secondary | ICD-10-CM | POA: Diagnosis not present

## 2016-11-08 DIAGNOSIS — I10 Essential (primary) hypertension: Secondary | ICD-10-CM | POA: Diagnosis not present

## 2016-11-15 DIAGNOSIS — I509 Heart failure, unspecified: Secondary | ICD-10-CM | POA: Diagnosis not present

## 2016-11-15 DIAGNOSIS — R413 Other amnesia: Secondary | ICD-10-CM | POA: Diagnosis not present

## 2016-11-15 DIAGNOSIS — C210 Malignant neoplasm of anus, unspecified: Secondary | ICD-10-CM | POA: Diagnosis not present

## 2016-11-15 DIAGNOSIS — Z0000 Encounter for general adult medical examination without abnormal findings: Secondary | ICD-10-CM | POA: Diagnosis not present

## 2016-11-15 DIAGNOSIS — D638 Anemia in other chronic diseases classified elsewhere: Secondary | ICD-10-CM | POA: Diagnosis not present

## 2016-11-15 DIAGNOSIS — Z6829 Body mass index (BMI) 29.0-29.9, adult: Secondary | ICD-10-CM | POA: Diagnosis not present

## 2016-11-15 DIAGNOSIS — Z1389 Encounter for screening for other disorder: Secondary | ICD-10-CM | POA: Diagnosis not present

## 2016-11-15 DIAGNOSIS — I447 Left bundle-branch block, unspecified: Secondary | ICD-10-CM | POA: Diagnosis not present

## 2016-11-15 DIAGNOSIS — H47092 Other disorders of optic nerve, not elsewhere classified, left eye: Secondary | ICD-10-CM | POA: Diagnosis not present

## 2016-11-15 DIAGNOSIS — R7309 Other abnormal glucose: Secondary | ICD-10-CM | POA: Diagnosis not present

## 2016-11-15 DIAGNOSIS — I110 Hypertensive heart disease with heart failure: Secondary | ICD-10-CM | POA: Diagnosis not present

## 2016-11-15 DIAGNOSIS — G5602 Carpal tunnel syndrome, left upper limb: Secondary | ICD-10-CM | POA: Diagnosis not present

## 2016-11-16 DIAGNOSIS — Z1212 Encounter for screening for malignant neoplasm of rectum: Secondary | ICD-10-CM | POA: Diagnosis not present

## 2016-11-20 ENCOUNTER — Ambulatory Visit (INDEPENDENT_AMBULATORY_CARE_PROVIDER_SITE_OTHER): Payer: Medicare Other | Admitting: *Deleted

## 2016-11-20 DIAGNOSIS — I5023 Acute on chronic systolic (congestive) heart failure: Secondary | ICD-10-CM | POA: Diagnosis not present

## 2016-11-20 DIAGNOSIS — Z95810 Presence of automatic (implantable) cardiac defibrillator: Secondary | ICD-10-CM

## 2016-11-20 NOTE — Progress Notes (Signed)
Remote ICD transmission.   

## 2016-11-22 DIAGNOSIS — Z86010 Personal history of colonic polyps: Secondary | ICD-10-CM | POA: Diagnosis not present

## 2016-11-22 DIAGNOSIS — K627 Radiation proctitis: Secondary | ICD-10-CM | POA: Diagnosis not present

## 2016-11-27 ENCOUNTER — Encounter: Payer: Self-pay | Admitting: Cardiology

## 2016-11-29 LAB — CUP PACEART REMOTE DEVICE CHECK
Brady Statistic RA Percent Paced: 0 %
Brady Statistic RV Percent Paced: 0 %
Date Time Interrogation Session: 20180614111701
HighPow Impedance: 78 Ohm
Implantable Lead Implant Date: 20171229
Implantable Lead Implant Date: 20171229
Implantable Lead Implant Date: 20171229
Implantable Lead Location: 753858
Implantable Lead Location: 753859
Implantable Lead Location: 753860
Implantable Lead Model: 292
Implantable Lead Model: 4677
Implantable Lead Model: 7740
Implantable Lead Serial Number: 423617
Implantable Lead Serial Number: 604122
Implantable Lead Serial Number: 686437
Implantable Pulse Generator Implant Date: 20171229
Lead Channel Impedance Value: 1038 Ohm
Lead Channel Impedance Value: 564 Ohm
Lead Channel Impedance Value: 734 Ohm
Lead Channel Sensing Intrinsic Amplitude: 11.3 mV
Lead Channel Sensing Intrinsic Amplitude: 25 mV
Lead Channel Sensing Intrinsic Amplitude: 3 mV
Lead Channel Setting Pacing Amplitude: 2 V
Lead Channel Setting Pacing Amplitude: 2.5 V
Lead Channel Setting Pacing Pulse Width: 0.4 ms
Lead Channel Setting Sensing Sensitivity: 0.5 mV
Lead Channel Setting Sensing Sensitivity: 1 mV
Pulse Gen Serial Number: 169900

## 2016-12-04 DIAGNOSIS — R8299 Other abnormal findings in urine: Secondary | ICD-10-CM | POA: Diagnosis not present

## 2016-12-04 DIAGNOSIS — F321 Major depressive disorder, single episode, moderate: Secondary | ICD-10-CM | POA: Diagnosis not present

## 2016-12-13 ENCOUNTER — Encounter: Payer: Self-pay | Admitting: Cardiology

## 2017-01-01 ENCOUNTER — Ambulatory Visit (INDEPENDENT_AMBULATORY_CARE_PROVIDER_SITE_OTHER): Payer: Medicare Other | Admitting: Internal Medicine

## 2017-01-01 ENCOUNTER — Encounter: Payer: Self-pay | Admitting: Internal Medicine

## 2017-01-01 VITALS — BP 118/72 | HR 91 | Ht 65.0 in | Wt 178.0 lb

## 2017-01-01 DIAGNOSIS — I447 Left bundle-branch block, unspecified: Secondary | ICD-10-CM

## 2017-01-01 DIAGNOSIS — Z95810 Presence of automatic (implantable) cardiac defibrillator: Secondary | ICD-10-CM

## 2017-01-01 DIAGNOSIS — I5023 Acute on chronic systolic (congestive) heart failure: Secondary | ICD-10-CM

## 2017-01-01 NOTE — Progress Notes (Signed)
HPI Samantha [name] returns today  for ongoing evaluation of her ICD. She is a very pleasant 76 yo woman with chronic systolic heart failure, EF 30%, despite maximal medical therapy, LBBB, and class 3 systolic symptoms. She underwent BiV ICD implant  5 months ago. She had some diaphragmatic stimulation that we were able to program around but this worsened. Her LV lead was turned off. She feels better. She has not had any ICD shocks. She is extremely anxious.   Allergies  Allergen Reactions  . Codeine     Upset stomach    . Iohexol Hives     Desc: Pt. states she broke out in hives 30 yrs ago w/ a CT Brain scan.  She has since had a heart cath and this CT today (08/10/05) w/o premeds and did well. No evident reaction.  Thanks.   . Shellfish Allergy Itching    Past history of itching with shrimp, has a history of allergy shots , no problem with seafood in years per patient.     Current Outpatient Prescriptions  Medication Sig Dispense Refill  . acetaminophen (TYLENOL) 500 MG tablet Take 500 mg by mouth every 6 (six) hours as needed for mild pain.    . carvedilol (COREG) 12.5 MG tablet Take 1 tablet (12.5 mg total) by mouth 2 (two) times daily. 180 tablet 3  . citalopram (CELEXA) 40 MG tablet Take 40 mg by mouth daily.     . furosemide (LASIX) 20 MG tablet Take 1 tablet (20 mg total) by mouth daily. 90 tablet 3  . GLUCOSAMINE-CHONDROITIN PO Take 1 tablet by mouth daily.    Marland Kitchen losartan (COZAAR) 25 MG tablet Take 25 mg by mouth daily.    . Omega-3 1000 MG CAPS Take 1,000 mg by mouth daily.    Marland Kitchen OVER THE COUNTER MEDICATION Take 1 tablet by mouth daily. Allergy medicaiton - Pt unsure of name    . Probiotic Product (PROBIOTIC PO) Take 1 tablet by mouth daily.    Marland Kitchen rOPINIRole (REQUIP) 0.5 MG tablet Take 1 tablet (0.5 mg total) by mouth at bedtime. 90 tablet 3  . valACYclovir (VALTREX) 500 MG tablet TAKE 1 TABLET BY MOUTH TWICE A DAY FOR 5 DAYS 10 tablet 0   No current facility-administered  medications for this visit.      Past Medical History:  Diagnosis Date  . Anemia   . 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 Cancer of colon ([place]) 08/2005   STAGE 1 CHEMO AND RADIATION  . Chronic systolic CHF (congestive heart failure) ([place])   . Colitis   . Febrile neutropenia ([place])   . Hypertension   . Hypokalemia   . LBBB (left bundle branch block)   . Movement disorder   . Mucositis   . Nonischemic cardiomyopathy ([place])    a. prior varying EF, 30% in 2017. s/p BSX CRTD 05/2016.  Marland Kitchen OSA (obstructive sleep apnea)   . 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 S/P cardiac cath 2000   Normal coronaries  . Sleep apnea    uses CPAP nightly    ROS:   All systems reviewed and negative except as noted in the HPI.   Past Surgical History:  Procedure Laterality Date  . APPENDECTOMY    . CARDIAC CATHETERIZATION    . CARPAL TUNNEL RELEASE Left 09/01/2015   Procedure: LEFT CARPAL TUNNEL RELEASE;  Surgeon: Leanora Cover, MD;  Location: [facility];  Service: Orthopedics;  Laterality: Left;  . cataract surgery removal     bilateral  . CESAREAN SECTION    . CHOLECYSTECTOMY    . EP IMPLANTABLE DEVICE N/A 06/15/2016   Procedure: BiV ICD Insertion CRT-D;  Surgeon: Evans Lance, MD;  Location: [facility];  Service: Cardiovascular;  Laterality: N/A;  . HEMORROIDECTOMY    . OPEN REDUCTION INTERNAL FIXATION (ORIF) DISTAL RADIAL FRACTURE Left 04/28/2015   Procedure: OPEN REDUCTION INTERNAL FIXATION (ORIF) LEFT DISTAL RADIUS FRACTURE;  Surgeon: Leanora Cover, MD;  Location: [facility];  Service: Orthopedics;  Laterality: Left;  . PORT-A-CATH REMOVAL    . TONSILLECTOMY    . TUBAL LIGATION    . US ECHOCARDIOGRAPHY  3/12   EF 30 to 35%     Family History  Problem Relation Age of Onset  . Diabetes Sister   . Cancer Brother        PROSTATE     Social History   Social History  . Marital status: Divorced    Spouse name:  N/A  . Number of children: 2  . Years of education: 12   Occupational History  .  Retired  . Retired    Social History Main Topics  . Smoking status: Never Smoker  . Smokeless tobacco: Never Used  . Alcohol use No  . Drug use: No  . Sexual activity: No     Comment: 1st intercourse 76 yo-More than 5 partners   Other Topics Concern  . Not on file   Social History Narrative   Patient lives at home alone.    Patient has 2 children.    Patient is retired.    Patient has a high school education.    Patient is divorced.    Patient is right handed.      BP 118/72   Pulse 91   Ht 5\' 5"  (1.651 m)   Wt 178 lb (80.7 kg)   SpO2 98%   BMI 29.62 kg/m   Physical Exam:  anxious appearing 76 yo woman, NAD HEENT: Unremarkable Neck:  6 cm JVD, no thyromegally Lymphatics:  No adenopathy Back:  No CVA tenderness Lungs:  Clear with no wheezes HEART:  Regular rate rhythm, no murmurs, no rubs, no clicks, split S2. Abd:  soft, positive bowel sounds, no organomegally, no rebound, no guarding Ext:  2 plus pulses, no edema, no cyanosis, no clubbing Skin:  No rashes no nodules Neuro:  CN II through XII intact, motor grossly intact  EKG - nsr with LBBB  Assess/Plan: 1. Chronic systolic heart failure - her symptoms are stable. I have recommended no additional surgery at this point. Her anxiety is such that she is visibly shaking in the office. She is not inclined to have any additional surgery. 2. HTN - her blood pressure is controlled. Will follow. 3. Her LV pacing threshold is greater than her diaphragmatic stimulation threshold. Her CXR demonstrates that the lead has backed up about a cm.  4. ICD - I have reviewed the device function which is normal. We have left her programmed VVI today.  Mikle Bosworth.D.

## 2017-01-01 NOTE — Patient Instructions (Signed)
Medication Instructions:  Your physician recommends that you continue on your current medications as directed. Please refer to the Current Medication list given to you today.   Labwork: None ordered.   Testing/Procedures: None ordered.   Follow-Up: Your physician wants you to follow-up in: 6 months with Dr. Lovena Le.   You will receive a reminder letter in the mail two months in advance. If you don't receive a letter, please call our office to schedule the follow-up appointment.  Remote monitoring is used to monitor your ICD from home. This monitoring reduces the number of office visits required to check your device to one time per year. It allows [place] to keep an eye on the functioning of your device to ensure it is working properly. You are scheduled for a device check from home on 02/19/2017. You may send your transmission at any time that day. If you have a wireless device, the transmission will be sent automatically. After your physician reviews your transmission, you will receive a postcard with your next transmission date.    Any Other Special Instructions Will Be Listed Below (If Applicable).     If you need a refill on your cardiac medications before your next appointment, please call your pharmacy.

## 2017-01-11 ENCOUNTER — Other Ambulatory Visit: Payer: Self-pay | Admitting: Internal Medicine

## 2017-01-11 DIAGNOSIS — R7309 Other abnormal glucose: Secondary | ICD-10-CM | POA: Diagnosis not present

## 2017-01-11 DIAGNOSIS — R413 Other amnesia: Secondary | ICD-10-CM | POA: Diagnosis not present

## 2017-01-11 DIAGNOSIS — R300 Dysuria: Secondary | ICD-10-CM | POA: Diagnosis not present

## 2017-01-11 DIAGNOSIS — Z6829 Body mass index (BMI) 29.0-29.9, adult: Secondary | ICD-10-CM | POA: Diagnosis not present

## 2017-01-11 DIAGNOSIS — B354 Tinea corporis: Secondary | ICD-10-CM | POA: Diagnosis not present

## 2017-01-14 ENCOUNTER — Ambulatory Visit
Admission: RE | Admit: 2017-01-14 | Discharge: 2017-01-14 | Disposition: A | Discharge status: Home / Self Care | Payer: Medicare Other | Source: Ambulatory Visit | Attending: Internal Medicine | Admitting: Internal Medicine

## 2017-01-14 DIAGNOSIS — R413 Other amnesia: Secondary | ICD-10-CM | POA: Diagnosis not present

## 2017-02-04 DIAGNOSIS — Z1231 Encounter for screening mammogram for malignant neoplasm of breast: Secondary | ICD-10-CM | POA: Diagnosis not present

## 2017-02-19 ENCOUNTER — Ambulatory Visit (INDEPENDENT_AMBULATORY_CARE_PROVIDER_SITE_OTHER): Payer: Medicare Other | Admitting: *Deleted

## 2017-02-19 DIAGNOSIS — I5022 Chronic systolic (congestive) heart failure: Secondary | ICD-10-CM

## 2017-02-19 DIAGNOSIS — Z95810 Presence of automatic (implantable) cardiac defibrillator: Secondary | ICD-10-CM

## 2017-02-21 ENCOUNTER — Telehealth: Payer: Self-pay | Admitting: Adult Health

## 2017-02-21 NOTE — Telephone Encounter (Signed)
Pt's son called pt is having severe memory issues. She can't remember talking with someone an hour later, doesn't remember events from a year ago this is intermittent. He is aware pt has appt on Monday 9/10. Please call

## 2017-02-21 NOTE — Progress Notes (Signed)
Remote ICD transmission.   

## 2017-02-22 NOTE — Telephone Encounter (Signed)
Spoke with son, Gerald Stabs who stated he didn't need anything. He stated either he or a family member will be with patient for ehr FU on Monday. He verbalized appreciation of call.

## 2017-02-25 ENCOUNTER — Ambulatory Visit: Payer: Medicare Other | Admitting: Adult Health

## 2017-02-25 ENCOUNTER — Ambulatory Visit (INDEPENDENT_AMBULATORY_CARE_PROVIDER_SITE_OTHER): Payer: Medicare Other | Admitting: Adult Health

## 2017-02-25 ENCOUNTER — Encounter: Payer: Self-pay | Admitting: Adult Health

## 2017-02-25 VITALS — BP 110/62 | HR 79 | Ht 65.0 in | Wt 182.8 lb

## 2017-02-25 DIAGNOSIS — G2581 Restless legs syndrome: Secondary | ICD-10-CM

## 2017-02-25 DIAGNOSIS — R413 Other amnesia: Secondary | ICD-10-CM

## 2017-02-25 DIAGNOSIS — Z9989 Dependence on other enabling machines and devices: Secondary | ICD-10-CM

## 2017-02-25 DIAGNOSIS — G4733 Obstructive sleep apnea (adult) (pediatric): Secondary | ICD-10-CM

## 2017-02-25 MED ORDER — MEMANTINE HCL 5 MG PO TABS: 5.0000 mg | ORAL_TABLET | Freq: Two times a day (BID) | ORAL | 5 refills | Status: DC

## 2017-02-25 NOTE — Progress Notes (Signed)
I agree with the assessment and plan as directed by NP .The patient has RLS and is known to me .   Macyn Shropshire, MD

## 2017-02-25 NOTE — Patient Instructions (Signed)
Your Plan:   Memory score has slightly decreased Start Namenda 5 mg twice a day If your symptoms worsen or you develop new symptoms please let us know.   Thank you for coming to see [place] at [facility]. I hope we have been able to provide you high quality care today.  You may receive a patient satisfaction survey over the next few weeks. We would appreciate your feedback and comments so that we may continue to improve ourselves and the health of our patients.  Memantine Tablets What is this medicine? MEMANTINE (MEM an teen) is used to treat dementia caused by Alzheimer's disease. This medicine may be used for other purposes; ask your health care provider or pharmacist if you have questions. COMMON BRAND NAME(S): Namenda What should I tell my health care provider before I take this medicine? They need to know if you have any of these conditions: -difficulty passing urine -kidney disease -liver disease -seizures -an unusual or allergic reaction to memantine, other medicines, foods, dyes, or preservatives -pregnant or trying to get pregnant -breast-feeding How should I use this medicine? Take this medicine by mouth with a glass of water. Follow the directions on the prescription label. You may take this medicine with or without food. Take your doses at regular intervals. Do not take your medicine more often than directed. Continue to take your medicine even if you feel better. Do not stop taking except on the advice of your doctor or health care professional. Talk to your pediatrician regarding the use of this medicine in children. Special care may be needed. Overdosage: If you think you have taken too much of this medicine contact a poison control center or emergency room at once. NOTE: This medicine is only for you. Do not share this medicine with others. What if I miss a dose? If you miss a dose, take it as soon as you can. If it is almost time for your next dose, take  only that dose. Do not take double or extra doses. If you do not take your medicine for several days, contact your health care provider. Your dose may need to be changed. What may interact with this medicine? -acetazolamide -amantadine -cimetidine -dextromethorphan -dofetilide -hydrochlorothiazide -ketamine -metformin -methazolamide -quinidine -ranitidine -sodium bicarbonate -triamterene This list may not describe all possible interactions. Give your health care provider a list of all the medicines, herbs, non-prescription drugs, or dietary supplements you use. Also tell them if you smoke, drink alcohol, or use illegal drugs. Some items may interact with your medicine. What should I watch for while using this medicine? Visit your doctor or health care professional for regular checks on your progress. Check with your doctor or health care professional if there is no improvement in your symptoms or if they get worse. You may get drowsy or dizzy. Do not drive, use machinery, or do anything that needs mental alertness until you know how this drug affects you. Do not stand or sit up quickly, especially if you are an older patient. This reduces the risk of dizzy or fainting spells. Alcohol can make you more drowsy and dizzy. Avoid alcoholic drinks. What side effects may I notice from receiving this medicine? Side effects that you should report to your doctor or health care professional as soon as possible: -allergic reactions like skin rash, itching or hives, swelling of the face, lips, or tongue -agitation or a feeling of restlessness -depressed mood -dizziness -hallucinations -redness, blistering, peeling or loosening of the skin, including  inside the mouth -seizures -vomiting Side effects that usually do not require medical attention (report to your doctor or health care professional if they continue or are bothersome): -constipation -diarrhea -headache -nausea -trouble sleeping This  list may not describe all possible side effects. Call your doctor for medical advice about side effects. You may report side effects to FDA at 1-800-FDA-1088. Where should I keep my medicine? Keep out of the reach of children. Store at room temperature between 15 degrees and 30 degrees C (59 degrees and 86 degrees F). Throw away any unused medicine after the expiration date. NOTE: This sheet is a summary. It may not cover all possible information. If you have questions about this medicine, talk to your doctor, pharmacist, or health care provider.  2018 Elsevier/Gold Standard (2013-03-23 14:10:42)

## 2017-02-25 NOTE — Progress Notes (Signed)
PATIENT: Samantha [name] DOB: 05-Oct-1940  REASON FOR VISIT: follow up- osa on cpap, RLS, memory HISTORY FROM: patient  HISTORY OF PRESENT ILLNESS: Today 02/25/17 Samantha [name] is a 76 year old female with a history of obstructive sleep apnea on CPAP, restless legs and memory disturbance. She returns today for follow-up. The patient again did not bring her CPAP memory card with her. She is here alone today. She reports that her memory is worse. Reports that her son and daughter are concerned. She reports that she is able to complete all ADLs independently. Her daughter lives with her. Reports that she doesn't operate a motor vehicle but only drives to familiar places. She denies any trouble sleeping. Reports that she uses the CPAP nightly. Reports a good appetite. Reports that there are times that she does have a depressed mood. She is on Celexa but reports that her primary care told her to take it as needed? Patient states that she does see a cardiologist. 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 Patient is a poor historian and is unable to tell me much about her cardiac history. She does state that the pacemaker/defibrillator is now working properly. She returns today for follow-up.    Patient reports that her memory has remained stable. She continues to live with her daughter. She is able to complete all ADLs independently. She operates the motor vehicle but states that she drives minimally. Denies any trouble sleeping. Denies any changes with her appetite. No change in mood or behavior. Reports that Requip continues to work well for restless leg. She returns today for an evaluation.  HISTORY 10/22/16: Today 10/22/16: Samantha [name] is a 76 year old female with a history of obstructive sleep apnea on CPAP, restless legs and memory disturbance. She returns today for follow-up. She feels that Requip is working well for her restless legs. She states that she does use the CPAP  nightly. In the past she had good compliance. Fortunately she did not bring her memory card with her today. She states the main thing she notices with her memory is that  she can be asked a question and it may take several hours for to come up with the answer. She lives with her daughter. She is able to complete all ADLs independently. She does operate a motor vehicle with states that she drives very little. She does report in the past she hasn't gotten lost. Denies any changes with her appetite. Reports that she is sleeping okay. She does report anxiety and constant fidgeting of the hands. She returns today for an evaluation.  REVIEW OF SYSTEMS: Out of a complete 14 system review of symptoms, the patient complains only of the following symptoms, and all other reviewed systems are negative.  See history of present illness  ALLERGIES: Allergies  Allergen Reactions  . Codeine     Upset stomach    . Iohexol Hives     Desc: Pt. states she broke out in hives 30 yrs ago w/ a CT Brain scan.  She has since had a heart cath and this CT today (08/10/05) w/o premeds and did well. No evident reaction.  Thanks.   . Shellfish Allergy Itching    Past history of itching with shrimp, has a history of allergy shots , no problem with seafood in years per patient.    HOME MEDICATIONS: Outpatient Medications Prior to Visit  Medication Sig Dispense Refill  . acetaminophen (TYLENOL) 500 MG tablet Take 500 mg by  mouth every 6 (six) hours as needed for mild pain.    . carvedilol (COREG) 12.5 MG tablet Take 1 tablet (12.5 mg total) by mouth 2 (two) times daily. 180 tablet 3  . citalopram (CELEXA) 40 MG tablet Take 40 mg by mouth daily.     . furosemide (LASIX) 20 MG tablet Take 1 tablet (20 mg total) by mouth daily. 90 tablet 3  . GLUCOSAMINE-CHONDROITIN PO Take 1 tablet by mouth daily.    Marland Kitchen losartan (COZAAR) 25 MG tablet Take 25 mg by mouth daily.    . Omega-3 1000 MG CAPS Take 1,000 mg by mouth daily.    Marland Kitchen OVER  THE COUNTER MEDICATION Take 1 tablet by mouth daily. Allergy medicaiton - Pt unsure of name    . Probiotic Product (PROBIOTIC PO) Take 1 tablet by mouth daily.    Marland Kitchen rOPINIRole (REQUIP) 0.5 MG tablet Take 1 tablet (0.5 mg total) by mouth at bedtime. 90 tablet 3  . valACYclovir (VALTREX) 500 MG tablet TAKE 1 TABLET BY MOUTH TWICE A DAY FOR 5 DAYS 10 tablet 0   No facility-administered medications prior to visit.     PAST MEDICAL HISTORY: Past Medical History:  Diagnosis Date  . Anemia   . 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 Cancer of colon ([place]) 08/2005   STAGE 1 CHEMO AND RADIATION  . Chronic systolic CHF (congestive heart failure) ([place])   . Colitis   . Febrile neutropenia ([place])   . Hypertension   . Hypokalemia   . LBBB (left bundle branch block)   . Movement disorder   . Mucositis   . Nonischemic cardiomyopathy ([place])    a. prior varying EF, 30% in 2017. s/p BSX CRTD 05/2016.  Marland Kitchen OSA (obstructive sleep apnea)   . Renal artery aneurysm ([place])   . S/P cardiac cath 2000   Normal coronaries  . Sleep apnea    uses CPAP nightly    PAST SURGICAL HISTORY: Past Surgical History:  Procedure Laterality Date  . APPENDECTOMY    . CARDIAC CATHETERIZATION    . CARPAL TUNNEL RELEASE Left 09/01/2015   Procedure: LEFT CARPAL TUNNEL RELEASE;  Surgeon: Leanora Cover, MD;  Location: [facility];  Service: Orthopedics;  Laterality: Left;  . cataract surgery removal     bilateral  . CESAREAN SECTION    . CHOLECYSTECTOMY    . EP IMPLANTABLE DEVICE N/A 06/15/2016   Procedure: BiV ICD Insertion CRT-D;  Surgeon: Evans Lance, MD;  Location: [facility];  Service: Cardiovascular;  Laterality: N/A;  . HEMORROIDECTOMY    . OPEN REDUCTION INTERNAL FIXATION (ORIF) DISTAL RADIAL FRACTURE Left 04/28/2015   Procedure: OPEN REDUCTION INTERNAL FIXATION (ORIF) LEFT DISTAL RADIUS FRACTURE;  Surgeon: Leanora Cover, MD;  Location: [facility];  Service: Orthopedics;  Laterality: Left;  . PORT-A-CATH REMOVAL    . TONSILLECTOMY    . TUBAL LIGATION    . US ECHOCARDIOGRAPHY  3/12   EF 30 to 35%    FAMILY HISTORY: Family History  Problem Relation Age of Onset  . Diabetes Sister   . Cancer Brother        PROSTATE    SOCIAL HISTORY: Social History   Social History  . Marital status: Divorced    Spouse name: N/A  . Number of children: 2  . Years of education: 12   Occupational History  .  Retired  . Retired    Social History Main Topics  .  Smoking status: Never Smoker  . Smokeless tobacco: Never Used  . Alcohol use No  . Drug use: No  . Sexual activity: No     Comment: 1st intercourse 76 yo-More than 5 partners   Other Topics Concern  . Not on file   Social History Narrative   Patient lives at home alone.    Patient has 2 children.    Patient is retired.    Patient has a high school education.    Patient is divorced.    Patient is right handed.       PHYSICAL EXAM  Vitals:   02/25/17 1442  BP: 110/62  Pulse: 79  Weight: 182 lb 12.8 oz (82.9 kg)  Height: 5\' 5"  (1.651 m)   Body mass index is 30.42 kg/m.   MMSE - Mini Mental State Exam 02/25/2017 10/22/2016  Orientation to time 5 4  Orientation to Place 5 5  Registration 3 3  Attention/ Calculation 1 5  Recall 1 0  Language- name 2 objects 2 2  Language- repeat 1 1  Language- follow 3 step command 3 3  Language- read & follow direction 1 1  Write a sentence 1 1  Copy design 1 1  Total score 24 26     Generalized: Well developed, in no acute distress   Neurological examination  Mentation: Alert oriented to time, place, history taking. Follows all commands speech and language fluent. Patient is very fidgety with her hands. Cranial nerve II-XII: Pupils were equal round reactive to light. Extraocular movements were full, visual field were full on confrontational test. Facial sensation and strength were normal. Uvula tongue midline.  Head turning and shoulder shrug  were normal and symmetric. Motor: The motor testing reveals 5 over 5 strength of all 4 extremities. Good symmetric motor tone is noted throughout.  Sensory: Sensory testing is intact to soft touch on all 4 extremities. No evidence of extinction is noted.  Coordination: Cerebellar testing reveals good finger-nose-finger and heel-to-shin bilaterally.  Gait and station: Gait is normal. Reflexes: Deep tendon reflexes are symmetric and normal bilaterally.   DIAGNOSTIC DATA (LABS, IMAGING, TESTING) - I reviewed patient records, labs, notes, testing and imaging myself where available.  Lab Results  Component Value Date   WBC 7.7 10/03/2016   HGB 13.0 10/03/2016   HCT 38.9 10/03/2016   MCV 96 10/03/2016   PLT 239 10/03/2016      Component Value Date/Time   NA 141 10/03/2016 1355   NA 141 10/01/2011 1302   K 4.8 10/03/2016 1355   K 4.3 10/01/2011 1302   CL 100 10/03/2016 1355   CL 100 10/01/2011 1302   CO2 25 10/03/2016 1355   CO2 30 10/01/2011 1302   GLUCOSE 92 10/03/2016 1355   GLUCOSE 131 (H) 06/16/2016 0500   GLUCOSE 93 10/01/2011 1302   BUN 17 10/03/2016 1355   BUN 18 10/01/2011 1302   CREATININE 0.91 10/03/2016 1355   CREATININE 1.17 (H) 06/05/2016 1549   CALCIUM 10.0 10/03/2016 1355   CALCIUM 8.8 10/01/2011 1302   PROT 6.8 11/25/2014 1427   PROT 7.2 10/01/2011 1302   ALBUMIN 4.5 11/25/2014 1427   AST 15 11/25/2014 1427   AST 22 10/01/2011 1302   ALT 17 11/25/2014 1427   ALT 23 10/01/2011 1302   ALKPHOS 71 11/25/2014 1427   ALKPHOS 69 10/01/2011 1302   BILITOT 0.3 11/25/2014 1427   BILITOT 0.40 10/01/2011 1302   GFRNONAA 62 10/03/2016 1355   GFRAA 71 10/03/2016  1355   Lab Results  Component Value Date   CHOL 185 03/10/2013   HDL 57.30 03/10/2013   LDLCALC 110 (H) 03/10/2013   TRIG 91.0 03/10/2013   CHOLHDL 3 03/10/2013   No results found for: HGBA1C No results found for: VITAMINB12 Lab Results  Component Value Date   TSH  1.872 11/27/2010      ASSESSMENT AND PLAN 76 y.o. year old female  has a past medical history of Anemia; Biventricular ICD (implantable cardioverter-defibrillator) in place (05/2016); Cancer of colon ([place]) (08/2005); Chronic systolic CHF (congestive heart failure) ([place]); Colitis; Febrile neutropenia ([place]); Hypertension; Hypokalemia; LBBB (left bundle branch block); Movement disorder; Mucositis; Nonischemic cardiomyopathy ([place]); OSA (obstructive sleep apnea); Renal artery aneurysm ([place]); S/P cardiac cath (2000); and Sleep apnea. here with :  1. Obstructive sleep apnea on CPAP 2. Restless leg syndrome 3. Memory disturbance  Patient's memory score has declined slightly. I suggested that we start Namenda 5 mg twice a day. If she is tolerating this in 1 month we will increase to 10 mg twice a day.. We will continue to monitor her memory.. She did not bring her memory card today. I have encouraged her to bring this week for [place] to check her compliance. Patient is advised that she should follow-up with her primary care for anxiety. She will remain on Requip for restless legs. If her symptoms worsen or she develops new symptoms she should let us know. She will follow-up in 6 months with Dr. Brett Fairy.     Ward Givens, MSN, NP-C 02/25/2017, 2:32 PM [facility] [address], [place],  [address] [phone]

## 2017-02-27 ENCOUNTER — Encounter: Payer: Self-pay | Admitting: Cardiology

## 2017-03-01 LAB — CUP PACEART REMOTE DEVICE CHECK
Battery Remaining Longevity: 132 mo
Brady Statistic RA Percent Paced: 0 %
Brady Statistic RV Percent Paced: 0 %
Date Time Interrogation Session: 20180914105007
HighPow Impedance: 80 Ohm
Implantable Lead Implant Date: 20171229
Implantable Lead Implant Date: 20171229
Implantable Lead Implant Date: 20171229
Implantable Lead Location: 753858
Implantable Lead Location: 753859
Implantable Lead Location: 753860
Implantable Lead Model: 292
Implantable Lead Model: 4677
Implantable Lead Model: 7740
Implantable Lead Serial Number: 423617
Implantable Lead Serial Number: 604122
Implantable Lead Serial Number: 686437
Implantable Pulse Generator Implant Date: 20171229
Lead Channel Impedance Value: 1105 Ohm
Lead Channel Impedance Value: 539 Ohm
Lead Channel Impedance Value: 793 Ohm
Lead Channel Sensing Intrinsic Amplitude: 10.7 mV
Lead Channel Sensing Intrinsic Amplitude: 25 mV
Lead Channel Sensing Intrinsic Amplitude: 4.3 mV
Lead Channel Setting Pacing Amplitude: 2 V
Lead Channel Setting Pacing Amplitude: 2.5 V
Lead Channel Setting Pacing Pulse Width: 0.4 ms
Lead Channel Setting Sensing Sensitivity: 0.5 mV
Lead Channel Setting Sensing Sensitivity: 1 mV
Pulse Gen Serial Number: 169900

## 2017-03-13 ENCOUNTER — Encounter: Payer: Medicare Other | Admitting: Gynecology

## 2017-03-14 ENCOUNTER — Encounter: Payer: Medicare Other | Admitting: Gynecology

## 2017-03-18 ENCOUNTER — Telehealth: Payer: Self-pay | Admitting: Adult Health

## 2017-03-18 NOTE — Telephone Encounter (Signed)
I LMVM for pt that it is ok to drop off chip for download tomorrow.   (she may wait on this as well).

## 2017-03-18 NOTE — Telephone Encounter (Signed)
Patient called office in reference to her CPAP machine reading full and would like to know if she is able to come drop the chip off tomorrow.  Please call

## 2017-03-22 ENCOUNTER — Other Ambulatory Visit: Payer: Self-pay | Admitting: *Deleted

## 2017-03-22 MED ORDER — MEMANTINE HCL 5 MG PO TABS: 5.0000 mg | ORAL_TABLET | Freq: Two times a day (BID) | ORAL | 1 refills | Status: DC

## 2017-04-06 ENCOUNTER — Other Ambulatory Visit: Payer: Self-pay | Admitting: Neurology

## 2017-04-09 ENCOUNTER — Encounter: Payer: Self-pay | Admitting: Adult Health

## 2017-04-09 ENCOUNTER — Telehealth: Payer: Self-pay | Admitting: Adult Health

## 2017-04-09 NOTE — Telephone Encounter (Signed)
Joy/[employer] [phone] called she is trying to help the pt get a new cpap machine thru medicare. Guide lines are OSA, usage of CPAP and if it is of benefit to patient must be documented. Last OV 9/10 these requirements were not documented. An appt has been scheduled with [facility] tomorrow 10/24 for this purpose. If any questions arise reg the appt the pt can be reached at [phone]

## 2017-04-09 NOTE — Telephone Encounter (Signed)
Noted  

## 2017-04-09 NOTE — Telephone Encounter (Signed)
LMVM for pt to bring CPAP machine or chip with her at appt tomorrow 1430. (be here 1400).

## 2017-04-09 NOTE — Telephone Encounter (Signed)
Please make sure the patient brings in her CPAP card

## 2017-04-09 NOTE — Telephone Encounter (Signed)
Patient will bring CPAP machine to appointment tomorrow.

## 2017-04-10 ENCOUNTER — Encounter: Payer: Self-pay | Admitting: Adult Health

## 2017-04-10 ENCOUNTER — Ambulatory Visit (INDEPENDENT_AMBULATORY_CARE_PROVIDER_SITE_OTHER): Payer: Medicare Other | Admitting: Adult Health

## 2017-04-10 VITALS — BP 117/63 | HR 80 | Ht 65.0 in | Wt 186.6 lb

## 2017-04-10 DIAGNOSIS — G4733 Obstructive sleep apnea (adult) (pediatric): Secondary | ICD-10-CM | POA: Diagnosis not present

## 2017-04-10 DIAGNOSIS — Z9989 Dependence on other enabling machines and devices: Secondary | ICD-10-CM | POA: Diagnosis not present

## 2017-04-10 DIAGNOSIS — R413 Other amnesia: Secondary | ICD-10-CM

## 2017-04-10 NOTE — Patient Instructions (Signed)
Your Plan:  Continue CPAP therapy  Increase pressure to 10 cm H20  If your symptoms worsen or you develop new symptoms please let us know.   Thank you for coming to see [place] at [facility]. I hope we have been able to provide you high quality care today.  You may receive a patient satisfaction survey over the next few weeks. We would appreciate your feedback and comments so that we may continue to improve ourselves and the health of our patients.

## 2017-04-10 NOTE — Progress Notes (Signed)
I agree with the assessment and plan as directed by NP .The patient is known to me .   Eulala Newcombe, MD  

## 2017-04-10 NOTE — Progress Notes (Signed)
PATIENT: Samantha [name] DOB: [birth year]  REASON FOR VISIT: follow up HISTORY FROM: patient  HISTORY OF PRESENT ILLNESS: Today 04/10/17  Samantha [name] is a 76 year old female with a history of obstructive sleep apnea on CPAP as well as memory disturbance. She returns today for follow-up.  Her CPAP download indicates that she uses her machine 25 out of 30 days for compliance of 83%. She uses her machine greater than 4 hours 23 out of 30 days for compliance of 77%. On average she uses her machine 10 hours and 10 minutes. Her residual AHI is 6.4 on 9 cm water with EPR 2. She does not have a significant leak. She does feel that the CPAP has been beneficial for her. She reports her daytime sleepiness has improved. She still notes some fatigue however she is unsure if this is more of a cardiac issue. She returns today for an evaluation.   REVIEW OF SYSTEMS: Out of a complete 14 system review of symptoms, the patient complains only of the following symptoms, and all other reviewed systems are negative.  See HPI  ALLERGIES: Allergies  Allergen Reactions  . Codeine     Upset stomach    . Iohexol Hives     Desc: Pt. states she broke out in hives 30 yrs ago w/ a CT Brain scan.  She has since had a heart cath and this CT today (08/10/05) w/o premeds and did well. No evident reaction.  Thanks.   . Shellfish Allergy Itching    Past history of itching with shrimp, has a history of allergy shots , no problem with seafood in years per patient.    HOME MEDICATIONS: Outpatient Medications Prior to Visit  Medication Sig Dispense Refill  . acetaminophen (TYLENOL) 500 MG tablet Take 500 mg by mouth every 6 (six) hours as needed for mild pain.    . citalopram (CELEXA) 40 MG tablet Take 40 mg by mouth daily.     Marland Kitchen GLUCOSAMINE-CHONDROITIN PO Take 1 tablet by mouth daily.    Marland Kitchen losartan (COZAAR) 25 MG tablet Take 25 mg by mouth daily.    . memantine (NAMENDA) 5 MG tablet Take 1 tablet (5 mg total) by mouth 2  (two) times daily. 180 tablet 1  . Omega-3 1000 MG CAPS Take 1,000 mg by mouth daily.    Marland Kitchen OVER THE COUNTER MEDICATION Take 1 tablet by mouth daily. Allergy medicaiton - Pt unsure of name    . Probiotic Product (PROBIOTIC PO) Take 1 tablet by mouth daily.    Marland Kitchen rOPINIRole (REQUIP) 0.5 MG tablet TAKE 1 TABLET (0.5 MG TOTAL) BY MOUTH AT BEDTIME. 90 tablet 3  . valACYclovir (VALTREX) 500 MG tablet TAKE 1 TABLET BY MOUTH TWICE A DAY FOR 5 DAYS 10 tablet 0  . carvedilol (COREG) 12.5 MG tablet Take 1 tablet (12.5 mg total) by mouth 2 (two) times daily. 180 tablet 3  . furosemide (LASIX) 20 MG tablet Take 1 tablet (20 mg total) by mouth daily. 90 tablet 3   No facility-administered medications prior to visit.     PAST MEDICAL HISTORY: Past Medical History:  Diagnosis Date  . Anemia   . 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 Cancer of colon ([place]) 08/2005   STAGE 1 CHEMO AND RADIATION  . Chronic systolic CHF (congestive heart failure) ([place])   . Colitis   . Febrile neutropenia ([place])   . Hypertension   . Hypokalemia   . LBBB (left bundle branch  block)   . Movement disorder   . Mucositis   . Nonischemic cardiomyopathy ([place])    a. prior varying EF, 30% in 2017. s/p BSX CRTD 05/2016.  Marland Kitchen OSA (obstructive sleep apnea)   . Renal artery aneurysm ([place])   . S/P cardiac cath 2000   Normal coronaries  . Sleep apnea    uses CPAP nightly    PAST SURGICAL HISTORY: Past Surgical History:  Procedure Laterality Date  . APPENDECTOMY    . CARDIAC CATHETERIZATION    . CARPAL TUNNEL RELEASE Left 09/01/2015   Procedure: LEFT CARPAL TUNNEL RELEASE;  Surgeon: Leanora Cover, MD;  Location: [place];  Service: Orthopedics;  Laterality: Left;  . cataract surgery removal     bilateral  . CESAREAN SECTION    . CHOLECYSTECTOMY    . EP IMPLANTABLE DEVICE N/A 06/15/2016   Procedure: BiV ICD Insertion CRT-D;  Surgeon: Evans Lance, MD;  Location: [place] [facility];  Service: Cardiovascular;  Laterality: N/A;  . HEMORROIDECTOMY    . OPEN REDUCTION INTERNAL FIXATION (ORIF) DISTAL RADIAL FRACTURE Left 04/28/2015   Procedure: OPEN REDUCTION INTERNAL FIXATION (ORIF) LEFT DISTAL RADIUS FRACTURE;  Surgeon: Leanora Cover, MD;  Location: [place];  Service: Orthopedics;  Laterality: Left;  . PORT-A-CATH REMOVAL    . TONSILLECTOMY    . TUBAL LIGATION    . US ECHOCARDIOGRAPHY  3/12   EF 30 to 35%    FAMILY HISTORY: Family History  Problem Relation Age of Onset  . Diabetes Sister   . Cancer Brother        PROSTATE    SOCIAL HISTORY: Social History   Social History  . Marital status: Divorced    Spouse name: N/A  . Number of children: 2  . Years of education: 12   Occupational History  .  Retired  . Retired    Social History Main Topics  . Smoking status: Never Smoker  . Smokeless tobacco: Never Used  . Alcohol use No  . Drug use: No  . Sexual activity: No     Comment: 1st intercourse 76 yo-More than 5 partners   Other Topics Concern  . Not on file   Social History Narrative   Patient lives at home alone.    Patient has 2 children.    Patient is retired.    Patient has a high school education.    Patient is divorced.    Patient is right handed.       PHYSICAL EXAM  Vitals:   04/10/17 1438  BP: 117/63  Pulse: 80  Weight: 186 lb 9.6 oz (84.6 kg)  Height: 5\' 5"  (1.651 m)   Body mass index is 31.05 kg/m.  Generalized: Well developed, in no acute distress   Neurological examination  Mentation: Alert  Follows all commands speech and language fluent Cranial nerve II-XII: Pupils were equal round reactive to light. Extraocular movements were full, visual field were full on confrontational test. Facial sensation and strength were normal. Uvula tongue midline. Head turning and shoulder shrug  were normal and symmetric. Motor: The motor testing reveals 5 over 5 strength of all 4 extremities. Good  symmetric motor tone is noted throughout.  Sensory: Sensory testing is intact to soft touch on all 4 extremities. No evidence of extinction is noted.  Coordination: Cerebellar testing reveals good finger-nose-finger and heel-to-shin bilaterally.  Gait and station: Gait is normal.  DIAGNOSTIC DATA (LABS, IMAGING, TESTING) - I reviewed patient records, labs,  notes, testing and imaging myself where available.  Lab Results  Component Value Date   WBC 7.7 10/03/2016   HGB 13.0 10/03/2016   HCT 38.9 10/03/2016   MCV 96 10/03/2016   PLT 239 10/03/2016      Component Value Date/Time   NA 141 10/03/2016 1355   NA 141 10/01/2011 1302   K 4.8 10/03/2016 1355   K 4.3 10/01/2011 1302   CL 100 10/03/2016 1355   CL 100 10/01/2011 1302   CO2 25 10/03/2016 1355   CO2 30 10/01/2011 1302   GLUCOSE 92 10/03/2016 1355   GLUCOSE 131 (H) 06/16/2016 0500   GLUCOSE 93 10/01/2011 1302   BUN 17 10/03/2016 1355   BUN 18 10/01/2011 1302   CREATININE 0.91 10/03/2016 1355   CREATININE 1.17 (H) 06/05/2016 1549   CALCIUM 10.0 10/03/2016 1355   CALCIUM 8.8 10/01/2011 1302   PROT 6.8 11/25/2014 1427   PROT 7.2 10/01/2011 1302   ALBUMIN 4.5 11/25/2014 1427   AST 15 11/25/2014 1427   AST 22 10/01/2011 1302   ALT 17 11/25/2014 1427   ALT 23 10/01/2011 1302   ALKPHOS 71 11/25/2014 1427   ALKPHOS 69 10/01/2011 1302   BILITOT 0.3 11/25/2014 1427   BILITOT 0.40 10/01/2011 1302   GFRNONAA 62 10/03/2016 1355   GFRAA 71 10/03/2016 1355   Lab Results  Component Value Date   CHOL 185 03/10/2013   HDL 57.30 03/10/2013   LDLCALC 110 (H) 03/10/2013   TRIG 91.0 03/10/2013   CHOLHDL 3 03/10/2013    Lab Results  Component Value Date   TSH 1.872 11/27/2010      ASSESSMENT AND PLAN 76 y.o. year old female  has a past medical history of Anemia; Biventricular ICD (implantable cardioverter-defibrillator) in place (05/2016); Cancer of colon ([place]) (08/2005); Chronic systolic CHF (congestive heart failure)  ([place]); Colitis; Febrile neutropenia ([place]); Hypertension; Hypokalemia; LBBB (left bundle branch block); Movement disorder; Mucositis; Nonischemic cardiomyopathy ([place]); OSA (obstructive sleep apnea); Renal artery aneurysm ([place]); S/P cardiac cath (2000); and Sleep apnea. here with:  1. OSA on CPAP 2. Memory disturbance  Patient's CPAP download shows good compliance. Her residual AHI is slightly elevated at 6.4. I will increase her pressure of 10 cm of water. She is advised that if she is unable to tolerate increase she will let us know. The patient was started on Namenda for her memory at the last visit. She will increase her dose to 10 mg twice a day. She will follow-up in 6 months or sooner if needed.     Ward Givens, MSN, NP-C 04/10/2017, 2:57 PM [facility] [address] [place],  [address] [phone]

## 2017-04-11 NOTE — Progress Notes (Signed)
Fax confirmation received [facility] [phone], cpap order, F2F ofv note. sy

## 2017-04-17 ENCOUNTER — Encounter: Payer: Self-pay | Admitting: Gynecology

## 2017-04-17 ENCOUNTER — Ambulatory Visit (INDEPENDENT_AMBULATORY_CARE_PROVIDER_SITE_OTHER): Payer: Medicare Other | Admitting: Gynecology

## 2017-04-17 VITALS — BP 118/76 | Ht 64.0 in | Wt 182.0 lb

## 2017-04-17 DIAGNOSIS — Z01419 Encounter for gynecological examination (general) (routine) without abnormal findings: Secondary | ICD-10-CM

## 2017-04-17 DIAGNOSIS — Z01411 Encounter for gynecological examination (general) (routine) with abnormal findings: Secondary | ICD-10-CM

## 2017-04-17 DIAGNOSIS — N952 Postmenopausal atrophic vaginitis: Secondary | ICD-10-CM

## 2017-04-17 NOTE — Progress Notes (Signed)
    Samantha [name] 10-Apr-1941 [id number]        76 y.o.  [id number] for breast and pelvic exam  Past medical history,surgical history, problem list, medications, allergies, family history and social history were all reviewed and documented as reviewed in the EPIC chart.  ROS:  Performed with pertinent positives and negatives included in the history, assessment and plan.   Additional significant findings : None   Exam: Caryn Bee assistant Vitals:   04/17/17 1439  BP: 118/76  Weight: 182 lb (82.6 kg)  Height: 5\' 4"  (1.626 m)   Body mass index is 31.24 kg/m.  General appearance:  Normal affect, orientation and appearance. Skin: Grossly normal HEENT: Without gross lesions.  No cervical or supraclavicular adenopathy. Thyroid normal.  Lungs:  Clear without wheezing, rales or rhonchi Cardiac: RR, without RMG Abdominal:  Soft, nontender, without masses, guarding, rebound, organomegaly or hernia Breasts:  Examined lying and sitting without masses, retractions, discharge or axillary adenopathy. Pelvic:  Ext, BUS, Vagina: With atrophic changes  Cervix: With atrophic changes  Uterus: Anteverted, normal size, shape and contour, midline and mobile nontender   Adnexa: Without masses or tenderness    Anus and perineum: Normal   Rectovaginal: Normal sphincter tone without palpated masses or tenderness.    Assessment/Plan:  76 y.o. [id number] female for breast and pelvic exam.   1. Postmenopausal/atrophic genital changes.  No significant hot flushes, night sweats, vaginal dryness or any vaginal bleeding.  Continue to monitor and report any issues or bleeding. 2. Pap smear 2015.  No Pap smear done today.  No history of abnormal Pap smears.  Per current screening guidelines we both agree to stop screening based on age. 3. Mammography due now and I reminded patient to schedule this.  Names and numbers provided for her to call.  Breast exam normal today. 4. Colonoscopy 2017.  Repeat at their  recommended interval. 5. DEXA followed through Dr Loren Racer office.  She will continue to follow-up with him in reference to bone health. 6. Health maintenance.  No routine lab work done as patient does this elsewhere.  Follow-up 1 year, sooner as needed.   Anastasio Auerbach MD, 3:07 PM 04/17/2017

## 2017-04-17 NOTE — Patient Instructions (Signed)
Call to Schedule your mammogram  Dr. Isaiah Blakes at [facility] [address] Phone: [phone]     Mammogram A mammogram is an X-ray test to find changes in a woman's breast. You should get a mammogram if:  You are 75 years of age or older  You have risk factors.   Your doctor recommends that you have one.  BEFORE THE TEST  Do not schedule the test the week before your period, especially if your breasts are sore during this time.  On the day of your mammogram:  Wash your breasts and armpits well. After washing, do not put on any deodorant or talcum powder on until after your test.   Eat and drink as you usually do.   Take your medicines as usual.   If you are diabetic and take insulin, make sure you:   Eat before coming for your test.   Take your insulin as usual.   If you cannot keep your appointment, call before the appointment to cancel. Schedule another appointment.  TEST  You will need to undress from the waist up. You will put on a hospital gown.   Your breast will be put on the mammogram machine, and it will press firmly on your breast with a piece of plastic called a compression paddle. This will make your breast flatter so that the machine can X-ray all parts of your breast.   Both breasts will be X-rayed. Each breast will be X-rayed from above and from the side. An X-ray might need to be taken again if the picture is not good enough.   The mammogram will last about 15 to 30 minutes.  AFTER THE TEST Finding out the results of your test Ask when your test results will be ready. Make sure you get your test results.  Document Released: 08/31/2008 Document Revised: 05/24/2011 Document Reviewed: 08/31/2008 [facility] Patient Information 2012 [place].

## 2017-05-01 ENCOUNTER — Other Ambulatory Visit: Payer: Self-pay | Admitting: Gynecology

## 2017-05-02 ENCOUNTER — Other Ambulatory Visit: Payer: Self-pay

## 2017-05-02 MED ORDER — VALACYCLOVIR HCL 500 MG PO TABS: ORAL_TABLET | ORAL | 1 refills | Status: DC

## 2017-05-02 NOTE — Telephone Encounter (Signed)
Yesterday you refilled her Valtrex for #10 w 3 refills. Today the pharmacy sent request asking for #30 at a time instead of #10. Is that ok?

## 2017-05-02 NOTE — Telephone Encounter (Signed)
Okay to refill per pharmacy request

## 2017-05-06 ENCOUNTER — Other Ambulatory Visit: Payer: Self-pay | Admitting: *Deleted

## 2017-05-06 MED ORDER — VALACYCLOVIR HCL 500 MG PO TABS: ORAL_TABLET | ORAL | 0 refills | Status: DC

## 2017-05-13 DIAGNOSIS — F321 Major depressive disorder, single episode, moderate: Secondary | ICD-10-CM | POA: Diagnosis not present

## 2017-05-13 DIAGNOSIS — Z6830 Body mass index (BMI) 30.0-30.9, adult: Secondary | ICD-10-CM | POA: Diagnosis not present

## 2017-05-13 DIAGNOSIS — E559 Vitamin D deficiency, unspecified: Secondary | ICD-10-CM | POA: Diagnosis not present

## 2017-05-13 DIAGNOSIS — D638 Anemia in other chronic diseases classified elsewhere: Secondary | ICD-10-CM | POA: Diagnosis not present

## 2017-05-13 DIAGNOSIS — Z23 Encounter for immunization: Secondary | ICD-10-CM | POA: Diagnosis not present

## 2017-05-13 DIAGNOSIS — I110 Hypertensive heart disease with heart failure: Secondary | ICD-10-CM | POA: Diagnosis not present

## 2017-05-13 DIAGNOSIS — R413 Other amnesia: Secondary | ICD-10-CM | POA: Diagnosis not present

## 2017-05-13 DIAGNOSIS — G2581 Restless legs syndrome: Secondary | ICD-10-CM | POA: Diagnosis not present

## 2017-05-13 DIAGNOSIS — M859 Disorder of bone density and structure, unspecified: Secondary | ICD-10-CM | POA: Diagnosis not present

## 2017-05-13 DIAGNOSIS — I10 Essential (primary) hypertension: Secondary | ICD-10-CM | POA: Diagnosis not present

## 2017-05-13 DIAGNOSIS — R7309 Other abnormal glucose: Secondary | ICD-10-CM | POA: Diagnosis not present

## 2017-05-13 DIAGNOSIS — I509 Heart failure, unspecified: Secondary | ICD-10-CM | POA: Diagnosis not present

## 2017-05-21 ENCOUNTER — Ambulatory Visit (INDEPENDENT_AMBULATORY_CARE_PROVIDER_SITE_OTHER): Payer: Medicare Other | Admitting: *Deleted

## 2017-05-21 DIAGNOSIS — I5022 Chronic systolic (congestive) heart failure: Secondary | ICD-10-CM | POA: Diagnosis not present

## 2017-05-21 LAB — CUP PACEART REMOTE DEVICE CHECK
Date Time Interrogation Session: 20181204140205
Implantable Lead Implant Date: 20171229
Implantable Lead Implant Date: 20171229
Implantable Lead Implant Date: 20171229
Implantable Lead Location: 753858
Implantable Lead Location: 753859
Implantable Lead Location: 753860
Implantable Lead Model: 292
Implantable Lead Model: 4677
Implantable Lead Model: 7740
Implantable Lead Serial Number: 423617
Implantable Lead Serial Number: 604122
Implantable Lead Serial Number: 686437
Implantable Pulse Generator Implant Date: 20171229
Lead Channel Setting Pacing Amplitude: 2 V
Lead Channel Setting Pacing Amplitude: 2.5 V
Lead Channel Setting Pacing Pulse Width: 0.4 ms
Lead Channel Setting Sensing Sensitivity: 0.5 mV
Lead Channel Setting Sensing Sensitivity: 1 mV
Pulse Gen Serial Number: 169900

## 2017-05-21 NOTE — Progress Notes (Signed)
Remote ICD transmission.   

## 2017-05-22 ENCOUNTER — Encounter: Payer: Self-pay | Admitting: Cardiology

## 2017-05-27 ENCOUNTER — Ambulatory Visit: Payer: Medicare Other | Admitting: Cardiovascular Disease

## 2017-07-01 ENCOUNTER — Ambulatory Visit (INDEPENDENT_AMBULATORY_CARE_PROVIDER_SITE_OTHER): Payer: Medicare Other | Admitting: Internal Medicine

## 2017-07-01 ENCOUNTER — Encounter: Payer: Self-pay | Admitting: Internal Medicine

## 2017-07-01 ENCOUNTER — Telehealth: Payer: Self-pay | Admitting: Neurology

## 2017-07-01 VITALS — BP 114/62 | HR 74 | Ht 65.0 in | Wt 182.0 lb

## 2017-07-01 DIAGNOSIS — Z95810 Presence of automatic (implantable) cardiac defibrillator: Secondary | ICD-10-CM | POA: Diagnosis not present

## 2017-07-01 DIAGNOSIS — I5022 Chronic systolic (congestive) heart failure: Secondary | ICD-10-CM

## 2017-07-01 LAB — CUP PACEART INCLINIC DEVICE CHECK
Brady Statistic RA Percent Paced: 0 %
Brady Statistic RV Percent Paced: 0 %
Date Time Interrogation Session: 20190114050000
HighPow Impedance: 67 Ohm
HighPow Impedance: 88 Ohm
Implantable Lead Implant Date: 20171229
Implantable Lead Implant Date: 20171229
Implantable Lead Implant Date: 20171229
Implantable Lead Location: 753858
Implantable Lead Location: 753859
Implantable Lead Location: 753860
Implantable Lead Model: 292
Implantable Lead Model: 4677
Implantable Lead Model: 7740
Implantable Lead Serial Number: 423617
Implantable Lead Serial Number: 604122
Implantable Lead Serial Number: 686437
Implantable Pulse Generator Implant Date: 20171229
Lead Channel Impedance Value: 1036 Ohm
Lead Channel Impedance Value: 549 Ohm
Lead Channel Impedance Value: 833 Ohm
Lead Channel Pacing Threshold Amplitude: 0.7 V
Lead Channel Pacing Threshold Amplitude: 0.8 V
Lead Channel Pacing Threshold Amplitude: 1.5 V
Lead Channel Pacing Threshold Pulse Width: 0.4 ms
Lead Channel Pacing Threshold Pulse Width: 0.4 ms
Lead Channel Pacing Threshold Pulse Width: 1 ms
Lead Channel Sensing Intrinsic Amplitude: 10.8 mV
Lead Channel Sensing Intrinsic Amplitude: 25 mV
Lead Channel Sensing Intrinsic Amplitude: 6.3 mV
Lead Channel Setting Pacing Amplitude: 2 V
Lead Channel Setting Pacing Amplitude: 2.5 V
Lead Channel Setting Pacing Pulse Width: 0.4 ms
Lead Channel Setting Sensing Sensitivity: 0.5 mV
Lead Channel Setting Sensing Sensitivity: 1 mV
Pulse Gen Serial Number: 169900

## 2017-07-01 NOTE — Patient Instructions (Addendum)
Medication Instructions:  Your physician recommends that you continue on your current medications as directed. Please refer to the Current Medication list given to you today.    Labwork: None ordered.   Testing/Procedures: None ordered.  Follow-Up: Your physician wants you to follow-up in: one year with Dr Lovena Le. You will receive a reminder letter in the mail two months in advance. If you don't receive a letter, please call our office to schedule the follow-up appointment.  Remote monitoring is used to monitor your ICD from home. This monitoring reduces the number of office visits required to check your device to one time per year. It allows [place] to keep an eye on the functioning of your device to ensure it is working properly. You are scheduled for a device check from home on 08/20/2017. You may send your transmission at any time that day. If you have a wireless device, the transmission will be sent automatically. After your physician reviews your transmission, you will receive a postcard with your next transmission date.   Any Other Special Instructions Will Be Listed Below (If Applicable).     If you need a refill on your cardiac medications before your next appointment, please call your pharmacy.

## 2017-07-01 NOTE — Telephone Encounter (Deleted)
Pts daughters faxed a

## 2017-07-01 NOTE — Telephone Encounter (Signed)
Pts daughter faxed over a form dealing with the power of attorney this morning and is needing it filled out and faxed to [phone] asap

## 2017-07-01 NOTE — Telephone Encounter (Signed)
Called the patient to make her aware that Dr Brett Fairy will have to leave the office early tomorrow on jan 15th. No answer LVM for the patient to call back. The patient can be seen in the earlier apts available on 15th that are opened or if NP has an opening can offer that as well

## 2017-07-01 NOTE — Telephone Encounter (Signed)
I have faxed over the paper for the patient

## 2017-07-01 NOTE — Progress Notes (Signed)
HPI Samantha [name] returns today for ongoing evaluation and management of her ICD. She is a pleasant 77 yo woman with chronic systolic heart failure. She underwent BiV ICD insertion but her LV lead was causing diaghragmatic stimulation and LV pacing was turned off. She has had some arthritic complaints in her left hip. She denies chest pain. No syncope. No edema. She remains anxious. Allergies  Allergen Reactions  . Codeine     Upset stomach    . Iohexol Hives     Desc: Pt. states she broke out in hives 30 yrs ago w/ a CT Brain scan.  She has since had a heart cath and this CT today (08/10/05) w/o premeds and did well. No evident reaction.  Thanks.   . Shellfish Allergy Itching    Past history of itching with shrimp, has a history of allergy shots , no problem with seafood in years per patient.     Current Outpatient Medications  Medication Sig Dispense Refill  . acetaminophen (TYLENOL) 500 MG tablet Take 500 mg by mouth every 6 (six) hours as needed for mild pain.    . carvedilol (COREG) 12.5 MG tablet Take 1 tablet (12.5 mg total) by mouth 2 (two) times daily. 180 tablet 3  . citalopram (CELEXA) 40 MG tablet Take 40 mg by mouth daily.     . furosemide (LASIX) 20 MG tablet Take 1 tablet (20 mg total) by mouth daily. 90 tablet 3  . GLUCOSAMINE-CHONDROITIN PO Take 1 tablet by mouth daily.    Marland Kitchen losartan (COZAAR) 25 MG tablet Take 25 mg by mouth daily.    . memantine (NAMENDA) 10 MG tablet Take 10 mg by mouth 2 (two) times daily.  1  . Omega-3 1000 MG CAPS Take 1,000 mg by mouth daily.    Marland Kitchen OVER THE COUNTER MEDICATION Take 1 tablet by mouth daily. Allergy medicaiton - Pt unsure of name    . Probiotic Product (PROBIOTIC PO) Take 1 tablet by mouth daily.    Marland Kitchen rOPINIRole (REQUIP) 0.5 MG tablet TAKE 1 TABLET (0.5 MG TOTAL) BY MOUTH AT BEDTIME. 90 tablet 3  . valACYclovir (VALTREX) 500 MG tablet TAKE 1 TABLET BY MOUTH TWICE A DAY FOR 5 DAYS 90 tablet 0   No current facility-administered  medications for this visit.      Past Medical History:  Diagnosis Date  . Anemia   . 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 Cancer of colon ([place]) 08/2005   STAGE 1 CHEMO AND RADIATION  . Chronic systolic CHF (congestive heart failure) ([place])   . Colitis   . Dementia   . Febrile neutropenia ([place])   . Hypertension   . Hypokalemia   . LBBB (left bundle branch block)   . Movement disorder   . Mucositis   . Nonischemic cardiomyopathy ([place])    a. prior varying EF, 30% in 2017. s/p BSX CRTD 05/2016.  Marland Kitchen OSA (obstructive sleep apnea)   . Renal artery aneurysm ([place])   . S/P cardiac cath 2000   Normal coronaries  . Sleep apnea    uses CPAP nightly    ROS:   All systems reviewed and negative except as noted in the HPI.   Past Surgical History:  Procedure Laterality Date  . APPENDECTOMY    . CARDIAC CATHETERIZATION    . CARPAL TUNNEL RELEASE Left 09/01/2015   Procedure: LEFT CARPAL TUNNEL RELEASE;  Surgeon: Leanora Cover, MD;  Location: [facility];  Service: Orthopedics;  Laterality: Left;  . cataract surgery removal     bilateral  . CESAREAN SECTION    . CHOLECYSTECTOMY    . EP IMPLANTABLE DEVICE N/A 06/15/2016   Procedure: BiV ICD Insertion CRT-D;  Surgeon: Evans Lance, MD;  Location: [facility];  Service: Cardiovascular;  Laterality: N/A;  . HEMORROIDECTOMY    . OPEN REDUCTION INTERNAL FIXATION (ORIF) DISTAL RADIAL FRACTURE Left 04/28/2015   Procedure: OPEN REDUCTION INTERNAL FIXATION (ORIF) LEFT DISTAL RADIUS FRACTURE;  Surgeon: Leanora Cover, MD;  Location: [facility];  Service: Orthopedics;  Laterality: Left;  . PORT-A-CATH REMOVAL    . TONSILLECTOMY    . TUBAL LIGATION    . US ECHOCARDIOGRAPHY  3/12   EF 30 to 35%     Family History  Problem Relation Age of Onset  . Diabetes Sister   . Cancer Brother        PROSTATE     Social History   Socioeconomic History  . Marital status:  Divorced    Spouse name: Not on file  . Number of children: 2  . Years of education: 68  . Highest education level: Not on file  Social Needs  . Financial resource strain: Not on file  . Food insecurity - worry: Not on file  . Food insecurity - inability: Not on file  . Transportation needs - medical: Not on file  . Transportation needs - non-medical: Not on file  Occupational History    Employer: RETIRED  . Occupation: Retired  Tobacco Use  . Smoking status: Never Smoker  . Smokeless tobacco: Never Used  Substance and Sexual Activity  . Alcohol use: No    Alcohol/week: 0.0 oz  . Drug use: No  . Sexual activity: No    Comment: 1st intercourse 77 yo-More than 5 partners  Other Topics Concern  . Not on file  Social History Narrative   Patient lives at home alone.    Patient has 2 children.    Patient is retired.    Patient has a high school education.    Patient is divorced.    Patient is right handed.      BP 114/62   Pulse 74   Ht 5\' 5"  (1.651 m)   Wt 182 lb (82.6 kg)   BMI 30.29 kg/m   Physical Exam:  Well appearing 77 yo woman, NAD HEENT: Unremarkable Neck:  6 cm JVD, no thyromegally Lymphatics:  No adenopathy Back:  No CVA tenderness Lungs:  Clear with no wheezes HEART:  Regular rate rhythm, no murmurs, no rubs, no clicks Abd:  soft, positive bowel sounds, no organomegally, no rebound, no guarding Ext:  2 plus pulses, no edema, no cyanosis, no clubbing Skin:  No rashes no nodules Neuro:  CN II through XII intact, motor grossly intact  EKG - NSR with LBBB and occaisional PVC's.   DEVICE  Normal device function.  See PaceArt for details.   Assess/Plan: 1. Chronic systolic heart failure - her symptoms are class 2. She will continue her current meds. I will not attempt an LV lead at this time. 2. ICD - interogation of her [place] Sci device is normal except that her LV remains off. 3. Anxiety - I have tried to reassure the patient.  4. Overweight - she  is encouraged to lose weight.  Mikle Bosworth.D.

## 2017-07-02 ENCOUNTER — Ambulatory Visit: Payer: Medicare Other | Admitting: Neurology

## 2017-07-29 ENCOUNTER — Encounter: Payer: Self-pay | Admitting: Cardiovascular Disease

## 2017-07-29 ENCOUNTER — Ambulatory Visit (INDEPENDENT_AMBULATORY_CARE_PROVIDER_SITE_OTHER): Payer: Medicare Other | Admitting: Cardiovascular Disease

## 2017-07-29 VITALS — BP 110/56 | HR 87 | Ht 65.0 in | Wt 185.8 lb

## 2017-07-29 DIAGNOSIS — I5022 Chronic systolic (congestive) heart failure: Secondary | ICD-10-CM | POA: Diagnosis not present

## 2017-07-29 NOTE — Progress Notes (Signed)
Cardiology Office Note   Date:  07/29/2017   ID:  Samantha [name], DOB 1940-07-31, MRN [id number]  PCP:  Samantha Pao, MD  Cardiologist:   Samantha Moores, MD   Chief Complaint  Patient presents with  . Congestive Heart Failure   1. Congestive heart failure- original EF 30-35%, now EF = 45-50% 2. History colon cancer-status post radiation and chemotherapy-April 2007 3. Smooth and normal coronary arteries by heart catheterization in 2000 4. OSA - wears CPAP      She has done well from a cardiac stand point. She has had some dizziness and found her BP to be low yesterday. She has occasional palpitations and frequently finds that her heart rate is about 100 with minimal exertion.  September 18, 2012:  Samantha [name] is doing well. Still has some dyspnea - especially when bending over or stooping over. No chest pain. She eats an ice cream sundae every day, sometimes twice a day. She still eats fast food almost all of the time. She had an EF of 35% several years ago. Her EF has improved to 45-50% with medical therapy.   Sept. 22, 2014:  Samantha [name] is doing well from a cardiac standpoint. She has been found to have a lung nodule.  She has occasional episodes of dyspnea - especially with exertion. She is still eating salty foods - she salts everything that she eats.   October 05, 2013:  Doing well from a cardiac standpoint. No CP, Does have some dyspnea. Has been out working out in the yard without problems.   Oct. 28, 2015:  Samantha [name] is doing well. She finds occasional elevated BP.  She still eats salt. Which likely explains her elevated BP   Oct 18, 2014:  Samantha [name] is a 76 y.o. female who presents for follow up of her CHF. She has floaters in her visual field this am Breathing is ok. Occasionally gets short of breath.    Nov. 8, 2016: Doing ok.   Has lost weight . Breathing is ok  Oct 31, 2015: Doing well from a cardiac standpoint.  Fatigued   Nov. 13, 2017:     Having problems with nervousness. Has some cold sweats between her shoulders- night sweats. Has lost 17 lbs over the past 6 months  Has had some bleeding in her stool  ( has appt with Samantha [name] tomorrow )   No CP Gets short of breath at times.   Does not get any exercise Last echo was in 2012 - EF 45-50% at that time   Nov 05, 2016:  No cardiac complaints  Her LV lead on her BI-V pacer is now turned off - it had migrated and was diaphragmatic pacing . So will see Samantha [name] in July  Is having some shortness of breath   Is now back on Coreg instead of Bystolic   Still eats salt -   Has had bad diarrhea for the past several weeks  July 29, 2017: Seen with daughter ,  Samantha [name]  Is still short of breath with any activity  Only exercise is walking to the mailbox and back ( 50-75 feet)  Is not inclined to   Past Medical History:  Diagnosis Date  . Anemia   . 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 Cancer of colon ([place]) 08/2005   STAGE 1 CHEMO AND RADIATION  . Chronic systolic CHF (congestive heart failure) ([place])   . Colitis   . Dementia   .  Febrile neutropenia ([place])   . Hypertension   . Hypokalemia   . LBBB (left bundle branch block)   . Movement disorder   . Mucositis   . Nonischemic cardiomyopathy ([place])    a. prior varying EF, 30% in 2017. s/p BSX CRTD 05/2016.  Marland Kitchen OSA (obstructive sleep apnea)   . 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 S/P cardiac cath 2000   Normal coronaries  . Sleep apnea    uses CPAP nightly    Past Surgical History:  Procedure Laterality Date  . APPENDECTOMY    . CARDIAC CATHETERIZATION    . CARPAL TUNNEL RELEASE Left 09/01/2015   Procedure: LEFT CARPAL TUNNEL RELEASE;  Surgeon: Samantha Cover, MD;  Location: [facility];  Service: Orthopedics;  Laterality: Left;  . cataract surgery removal     bilateral  . CESAREAN SECTION    . CHOLECYSTECTOMY    . EP IMPLANTABLE DEVICE N/A 06/15/2016    Procedure: BiV ICD Insertion CRT-D;  Surgeon: Samantha Lance, MD;  Location: [facility];  Service: Cardiovascular;  Laterality: N/A;  . HEMORROIDECTOMY    . OPEN REDUCTION INTERNAL FIXATION (ORIF) DISTAL RADIAL FRACTURE Left 04/28/2015   Procedure: OPEN REDUCTION INTERNAL FIXATION (ORIF) LEFT DISTAL RADIUS FRACTURE;  Surgeon: Samantha Cover, MD;  Location: [facility];  Service: Orthopedics;  Laterality: Left;  . PORT-A-CATH REMOVAL    . TONSILLECTOMY    . TUBAL LIGATION    . US ECHOCARDIOGRAPHY  3/12   EF 30 to 35%     Current Outpatient Medications  Medication Sig Dispense Refill  . acetaminophen (TYLENOL) 500 MG tablet Take 500 mg by mouth every 6 (six) hours as needed for mild pain.    . cholecalciferol (VITAMIN D) 400 units TABS tablet Take 400 Units by mouth daily.    . citalopram (CELEXA) 40 MG tablet Take 40 mg by mouth daily.     Marland Kitchen losartan (COZAAR) 25 MG tablet Take 25 mg by mouth daily.    . memantine (NAMENDA) 10 MG tablet Take 10 mg by mouth 2 (two) times daily.  1  . Omega-3 1000 MG CAPS Take 1,000 mg by mouth daily.    Marland Kitchen OVER THE COUNTER MEDICATION Take 1 tablet by mouth daily. Allergy medicaiton - Pt unsure of name    . Probiotic Product (PROBIOTIC PO) Take 1 tablet by mouth daily.    Marland Kitchen rOPINIRole (REQUIP) 0.5 MG tablet TAKE 1 TABLET (0.5 MG TOTAL) BY MOUTH AT BEDTIME. 90 tablet 3  . carvedilol (COREG) 12.5 MG tablet Take 1 tablet (12.5 mg total) by mouth 2 (two) times daily. 180 tablet 3  . furosemide (LASIX) 20 MG tablet Take 1 tablet (20 mg total) by mouth daily. 90 tablet 3   No current facility-administered medications for this visit.     Allergies:   Codeine; Iohexol; and Shellfish allergy    Social History:  The patient  reports that  has never smoked. she has never used smokeless tobacco. She reports that she does not drink alcohol or use drugs.   Family History:  The patient's family history includes Cancer in her brother; Diabetes in her  sister.    ROS:  Noted in current history, otherwise systems are negative.  Physical Exam: Blood pressure (!) 110/56, pulse 87, height 5\' 5"  (1.651 m), weight 185 lb 12.8 oz (84.3 kg), SpO2 97 %.  GEN:  Well nourished, well developed in no acute distress HEENT: Normal NECK: No JVD; No  carotid bruits LYMPHATICS: No lymphadenopathy CARDIAC: RR, no murmurs, rubs, gallops RESPIRATORY:  Clear to auscultation without rales, wheezing or rhonchi  ABDOMEN: Soft, non-tender, non-distended MUSCULOSKELETAL:  No edema; No deformity  SKIN: Warm and dry NEUROLOGIC:  Alert and oriented x 3   EKG:  EKG is not ordered today.      Recent Labs: 10/03/2016: BUN 17; Creatinine, Ser 0.91; Hemoglobin 13.0; NT-Pro BNP 510; Platelets 239; Potassium 4.8; Sodium 141    Lipid Panel    Component Value Date/Time   CHOL 185 03/10/2013 1142   TRIG 91.0 03/10/2013 1142   HDL 57.30 03/10/2013 1142   CHOLHDL 3 03/10/2013 1142   VLDL 18.2 03/10/2013 1142   LDLCALC 110 (H) 03/10/2013 1142      Wt Readings from Last 3 Encounters:  07/29/17 185 lb 12.8 oz (84.3 kg)  07/01/17 182 lb (82.6 kg)  04/17/17 182 lb (82.6 kg)      Other studies Reviewed: Additional studies/ records that were reviewed today include: . Review of the above records demonstrates:    ASSESSMENT AND PLAN:  1. Congestive heart failure- original EF 30-35%, now EF = 45-50%,    Currently her LV lead is turned off.  Still eats lots of salt  Advised salt restriction and exercise  She seems to be alert and oriented and certainly was able to understand my advice.  It seems that she just does not want to do very much to helping her condition.  She might need to see a physical therapist to make sure that she can walk safely.  We will leave this up to her primary care doctor.  2. History colon cancer-status post radiation and chemotherapy-April 2007  3. Smooth and normal coronary arteries by heart catheterization in 2000  4.  Anxiety:      5. Night sweats and weight loss:    Current medicines are reviewed at length with the patient today.  The patient does not have concerns regarding medicines.  The following changes have been made:  no change  Labs/ tests ordered today include:  No orders of the defined types were placed in this encounter.   Disposition:   FU with me in 6 months      Samantha Moores, MD  07/29/2017 4:03 PM    [facility] [address], [place], [place]  [address] Phone: [phone]; Fax: [phone]

## 2017-07-29 NOTE — Patient Instructions (Signed)
Medication Instructions:  Your physician recommends that you continue on your current medications as directed. Please refer to the Current Medication list given to you today.   Labwork: Your physician recommends that you return for lab work in: at your next office visit for basic metabolic panel   Testing/Procedures: None Ordered   Follow-Up: Your physician wants you to follow-up in: 6 months with Dr. Acie Fredrickson.  You will receive a reminder letter in the mail two months in advance. If you don't receive a letter, please call our office to schedule the follow-up appointment.   If you need a refill on your cardiac medications before your next appointment, please call your pharmacy.   Thank you for choosing [facility]! Christen Bame, RN [phone]

## 2017-08-19 DIAGNOSIS — F321 Major depressive disorder, single episode, moderate: Secondary | ICD-10-CM | POA: Diagnosis not present

## 2017-08-19 DIAGNOSIS — I509 Heart failure, unspecified: Secondary | ICD-10-CM | POA: Diagnosis not present

## 2017-08-19 DIAGNOSIS — N3941 Urge incontinence: Secondary | ICD-10-CM | POA: Diagnosis not present

## 2017-08-19 DIAGNOSIS — I10 Essential (primary) hypertension: Secondary | ICD-10-CM | POA: Diagnosis not present

## 2017-08-19 DIAGNOSIS — R7309 Other abnormal glucose: Secondary | ICD-10-CM | POA: Diagnosis not present

## 2017-08-19 DIAGNOSIS — R413 Other amnesia: Secondary | ICD-10-CM | POA: Diagnosis not present

## 2017-08-19 DIAGNOSIS — Z6830 Body mass index (BMI) 30.0-30.9, adult: Secondary | ICD-10-CM | POA: Diagnosis not present

## 2017-08-19 DIAGNOSIS — E559 Vitamin D deficiency, unspecified: Secondary | ICD-10-CM | POA: Diagnosis not present

## 2017-08-19 DIAGNOSIS — G4733 Obstructive sleep apnea (adult) (pediatric): Secondary | ICD-10-CM | POA: Diagnosis not present

## 2017-08-19 DIAGNOSIS — H47092 Other disorders of optic nerve, not elsewhere classified, left eye: Secondary | ICD-10-CM | POA: Diagnosis not present

## 2017-08-19 DIAGNOSIS — G2581 Restless legs syndrome: Secondary | ICD-10-CM | POA: Diagnosis not present

## 2017-08-19 DIAGNOSIS — I110 Hypertensive heart disease with heart failure: Secondary | ICD-10-CM | POA: Diagnosis not present

## 2017-08-20 ENCOUNTER — Ambulatory Visit (INDEPENDENT_AMBULATORY_CARE_PROVIDER_SITE_OTHER): Payer: Medicare Other | Admitting: *Deleted

## 2017-08-20 DIAGNOSIS — I5023 Acute on chronic systolic (congestive) heart failure: Secondary | ICD-10-CM | POA: Diagnosis not present

## 2017-08-20 NOTE — Progress Notes (Signed)
Remote ICD transmission.   

## 2017-08-21 ENCOUNTER — Encounter: Payer: Self-pay | Admitting: Cardiology

## 2017-08-22 LAB — CUP PACEART REMOTE DEVICE CHECK
Date Time Interrogation Session: 20190307100112
Implantable Lead Implant Date: 20171229
Implantable Lead Implant Date: 20171229
Implantable Lead Implant Date: 20171229
Implantable Lead Location: 753858
Implantable Lead Location: 753859
Implantable Lead Location: 753860
Implantable Lead Model: 292
Implantable Lead Model: 4677
Implantable Lead Model: 7740
Implantable Lead Serial Number: 423617
Implantable Lead Serial Number: 604122
Implantable Lead Serial Number: 686437
Implantable Pulse Generator Implant Date: 20171229
Pulse Gen Serial Number: 169900

## 2017-08-27 DIAGNOSIS — D225 Melanocytic nevi of trunk: Secondary | ICD-10-CM | POA: Diagnosis not present

## 2017-08-27 DIAGNOSIS — L570 Actinic keratosis: Secondary | ICD-10-CM | POA: Diagnosis not present

## 2017-08-27 DIAGNOSIS — Z872 Personal history of diseases of the skin and subcutaneous tissue: Secondary | ICD-10-CM | POA: Diagnosis not present

## 2017-08-27 DIAGNOSIS — L814 Other melanin hyperpigmentation: Secondary | ICD-10-CM | POA: Diagnosis not present

## 2017-09-06 ENCOUNTER — Encounter (HOSPITAL_COMMUNITY): Payer: Self-pay | Admitting: *Deleted

## 2017-09-06 ENCOUNTER — Emergency Department (HOSPITAL_COMMUNITY): Payer: Medicare Other

## 2017-09-06 ENCOUNTER — Other Ambulatory Visit: Payer: Self-pay

## 2017-09-06 ENCOUNTER — Emergency Department (HOSPITAL_COMMUNITY)
Admission: EM | Admit: 2017-09-06 | Discharge: 2017-09-06 | Disposition: A | Discharge status: Home / Self Care | Payer: Medicare Other | Attending: Emergency Medicine | Admitting: Emergency Medicine

## 2017-09-06 DIAGNOSIS — Z79899 Other long term (current) drug therapy: Secondary | ICD-10-CM | POA: Insufficient documentation

## 2017-09-06 DIAGNOSIS — F0390 Unspecified dementia without behavioral disturbance: Secondary | ICD-10-CM | POA: Diagnosis not present

## 2017-09-06 DIAGNOSIS — I5022 Chronic systolic (congestive) heart failure: Secondary | ICD-10-CM | POA: Insufficient documentation

## 2017-09-06 DIAGNOSIS — I110 Hypertensive heart disease with heart failure: Secondary | ICD-10-CM | POA: Insufficient documentation

## 2017-09-06 DIAGNOSIS — R55 Syncope and collapse: Secondary | ICD-10-CM

## 2017-09-06 DIAGNOSIS — R42 Dizziness and giddiness: Secondary | ICD-10-CM | POA: Diagnosis present

## 2017-09-06 LAB — URINALYSIS, ROUTINE W REFLEX MICROSCOPIC
Bacteria, UA: NONE SEEN
Bilirubin Urine: NEGATIVE
Glucose, UA: NEGATIVE mg/dL
Ketones, ur: NEGATIVE mg/dL
Leukocytes, UA: NEGATIVE
Nitrite: NEGATIVE
Protein, ur: NEGATIVE mg/dL
Specific Gravity, Urine: 1.013 (ref 1.005–1.030)
pH: 7 (ref 5.0–8.0)

## 2017-09-06 LAB — BASIC METABOLIC PANEL
Anion gap: 10 (ref 5–15)
BUN: 27 mg/dL — ABNORMAL HIGH (ref 6–20)
CO2: 25 mmol/L (ref 22–32)
Calcium: 9.2 mg/dL (ref 8.9–10.3)
Chloride: 103 mmol/L (ref 101–111)
Creatinine, Ser: 1.3 mg/dL — ABNORMAL HIGH (ref 0.44–1.00)
GFR calc Af Amer: 45 mL/min — ABNORMAL LOW (ref >60)
GFR calc non Af Amer: 39 mL/min — ABNORMAL LOW (ref >60)
Glucose, Bld: 131 mg/dL — ABNORMAL HIGH (ref 65–99)
Potassium: 4.2 mmol/L (ref 3.5–5.1)
Sodium: 138 mmol/L (ref 135–145)

## 2017-09-06 LAB — I-STAT CHEM 8, ED
BUN: 30 mg/dL — ABNORMAL HIGH (ref 6–20)
Calcium, Ion: 1.13 mmol/L — ABNORMAL LOW (ref 1.15–1.40)
Chloride: 103 mmol/L (ref 101–111)
Creatinine, Ser: 1.3 mg/dL — ABNORMAL HIGH (ref 0.44–1.00)
Glucose, Bld: 127 mg/dL — ABNORMAL HIGH (ref 65–99)
HCT: 37 % (ref 36.0–46.0)
Hemoglobin: 12.6 g/dL (ref 12.0–15.0)
Potassium: 4.3 mmol/L (ref 3.5–5.1)
Sodium: 140 mmol/L (ref 135–145)
TCO2: 27 mmol/L (ref 22–32)

## 2017-09-06 LAB — CBC
HCT: 37.4 % (ref 36.0–46.0)
Hemoglobin: 11.9 g/dL — ABNORMAL LOW (ref 12.0–15.0)
MCH: 32 pg (ref 26.0–34.0)
MCHC: 31.8 g/dL (ref 30.0–36.0)
MCV: 100.5 fL — ABNORMAL HIGH (ref 78.0–100.0)
Platelets: 192 10*3/uL (ref 150–400)
RBC: 3.72 MIL/uL — ABNORMAL LOW (ref 3.87–5.11)
RDW: 13.4 % (ref 11.5–15.5)
WBC: 7 10*3/uL (ref 4.0–10.5)

## 2017-09-06 LAB — I-STAT TROPONIN, ED: Troponin i, poc: 0 ng/mL (ref 0.00–0.08)

## 2017-09-06 NOTE — Discharge Instructions (Signed)
Continue your current meds.   See your cardiologist or primary doctor in a week.   Stay hydrated.   Return to ER if you have worse dizziness, passing out, headaches, weakness, trouble speaking.

## 2017-09-06 NOTE — ED Triage Notes (Signed)
Pt reports having syncopal episode yesterday morning after getting up and walking to restroom. Pt was able to sit down on toilet and not fall. Pt has cardiac hx. Her only complaint is fatigue and sob. Denies chest pain.

## 2017-09-06 NOTE — ED Provider Notes (Signed)
Patient placed in Quick Look pathway, seen and evaluated   Chief Complaint: Pre syncope  HPI:   Level 5 caveat due to dementia History is provided by the patient and her son who is unable to offer much significant information.  Early yesterday morning the patient got up to go to the bathroom and on the way down the hall she began having tunnel vision and muffled hearing and felt like she was going to pass out.  She hurried to the bathroom sat down on the toilet and propped herself up against the side of the wall to make sure she was okay.  She is unsure how long this lasted.  Apparently has a pacemaker or defibrillator and states that she has noticed some skipping in her heart.  She denies melena, hematochezia.  She does take Lasix daily.  She denies any previous episodes of presyncope  ROS: Presyncope (one)  Physical Exam:   Gen: No distress  Neuro: Awake and Alert  Skin: Warm    Focused Exam: Heart and lung sounds normal, soft abdomen normal bowel sounds   Initiation of care has begun. The patient has been counseled on the process, plan, and necessity for staying for the completion/evaluation, and the remainder of the medical screening examination    Ned Grace 09/06/17 1844    Lacretia Leigh, MD 09/07/17 2216

## 2017-09-06 NOTE — ED Notes (Addendum)
[employer] pace maker interrogated  ?

## 2017-09-06 NOTE — ED Notes (Signed)
Patient verbalizes understanding of discharge instructions. Opportunity for questioning and answers were provided. Armband removed by staff, pt discharged from ED ambulatory.   

## 2017-09-06 NOTE — ED Provider Notes (Signed)
[facility] EMERGENCY DEPARTMENT Provider Note   CSN: [id number] Arrival date & time: 09/06/17  1800     History   Chief Complaint Chief Complaint  Patient presents with  . Loss of Consciousness    HPI Samantha [name] is a 77 y.o. female history of CHF, left bundle branch block with pacemaker, dementia, here presenting with dizziness, near syncope.  Patient states that she was walking down the hall yesterday and had sudden onset dizziness and felt like she was going to pass out.  She was able to sit down and did not hit her head.  She denies any chest pain or shortness of breath.  Denies any palpitations.  She does have a pacemaker for heart failure.  Denies leg swelling or trouble breathing  The history is provided by the patient.  Level V caveat- dementia   Past Medical History:  Diagnosis Date  . Anemia   . 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 Cancer of colon ([place]) 08/2005   STAGE 1 CHEMO AND RADIATION  . Chronic systolic CHF (congestive heart failure) ([place])   . Colitis   . Dementia   . Febrile neutropenia ([place])   . Hypertension   . Hypokalemia   . LBBB (left bundle branch block)   . Movement disorder   . Mucositis   . Nonischemic cardiomyopathy ([place])    a. prior varying EF, 30% in 2017. s/p BSX CRTD 05/2016.  Marland Kitchen OSA (obstructive sleep apnea)   . 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 S/P cardiac cath 2000   Normal coronaries  . Sleep apnea    uses CPAP nightly    Patient Active Problem List   Diagnosis Date Noted  . Chronic systolic CHF (congestive heart failure) ([place]) 06/15/2016  . RLS (restless legs syndrome) 07/07/2015  . Restless leg syndrome 12/30/2014  . Anemia, iron deficiency 11/25/2014  . OSA on CPAP 11/25/2014  . Fatigue due to sleep pattern disturbance 11/25/2014  . Restless legs syndrome (RLS) 11/24/2012  . Essential and other specified forms of tremor 11/24/2012  . 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 SBO (small bowel obstruction) ([place]) 06/05/2011  . HTN (hypertension) 02/07/2011  . Abnormal EKG 11/27/2010  . OSA (obstructive sleep apnea) 10/10/2010  . Chronic systolic congestive heart failure ([place]) 09/29/2010  . Left bundle branch block 09/29/2010    Past Surgical History:  Procedure Laterality Date  . APPENDECTOMY    . CARDIAC CATHETERIZATION    . CARPAL TUNNEL RELEASE Left 09/01/2015   Procedure: LEFT CARPAL TUNNEL RELEASE;  Surgeon: Leanora Cover, MD;  Location: [facility];  Service: Orthopedics;  Laterality: Left;  . cataract surgery removal     bilateral  . CESAREAN SECTION    . CHOLECYSTECTOMY    . EP IMPLANTABLE DEVICE N/A 06/15/2016   Procedure: BiV ICD Insertion CRT-D;  Surgeon: Evans Lance, MD;  Location: [facility];  Service: Cardiovascular;  Laterality: N/A;  . HEMORROIDECTOMY    . OPEN REDUCTION INTERNAL FIXATION (ORIF) DISTAL RADIAL FRACTURE Left 04/28/2015   Procedure: OPEN REDUCTION INTERNAL FIXATION (ORIF) LEFT DISTAL RADIUS FRACTURE;  Surgeon: Leanora Cover, MD;  Location: [facility];  Service: Orthopedics;  Laterality: Left;  . PORT-A-CATH REMOVAL    . TONSILLECTOMY    . TUBAL LIGATION    . US ECHOCARDIOGRAPHY  3/12   EF 30 to 35%     OB History    Gravida  2  Para  2   Term  2   Preterm      AB      Living  2     SAB      TAB      Ectopic      Multiple      Live Births               Home Medications    Prior to Admission medications   Medication Sig Start Date End Date Taking? Authorizing Provider  carvedilol (COREG) 12.5 MG tablet Take 1 tablet (12.5 mg total) by mouth 2 (two) times daily. 10/03/16 09/06/17 Yes Seiler, Amber K, NP  Cholecalciferol (VITAMIN D3) 2000 units TABS Take 2,000 Units by mouth daily.   Yes [provider]  citalopram (CELEXA) 40 MG tablet Take 40 mg by mouth daily.  08/09/10  Yes [provider]  furosemide (LASIX) 20 MG tablet Take 1 tablet (20  mg total) by mouth daily. 10/03/16 09/06/17 Yes Seiler, Amber K, NP  loratadine (CLARITIN) 10 MG tablet Take 10 mg by mouth daily.   Yes [provider]  losartan (COZAAR) 25 MG tablet Take 25 mg by mouth daily. 04/18/16  Yes [provider]  memantine (NAMENDA) 10 MG tablet Take 10 mg by mouth 2 (two) times daily. 06/09/17  Yes [provider]  rOPINIRole (REQUIP) 0.5 MG tablet TAKE 1 TABLET (0.5 MG TOTAL) BY MOUTH AT BEDTIME. 04/08/17  Yes Dohmeier, Asencion Partridge, MD    Family History Family History  Problem Relation Age of Onset  . Diabetes Sister   . Cancer Brother        PROSTATE    Social History Social History   Tobacco Use  . Smoking status: Never Smoker  . Smokeless tobacco: Never Used  Substance Use Topics  . Alcohol use: No    Alcohol/week: 0.0 oz  . Drug use: No     Allergies   Codeine; Iohexol; and Shellfish allergy   Review of Systems Review of Systems  Cardiovascular: Positive for syncope.  Neurological: Positive for dizziness.  All other systems reviewed and are negative.    Physical Exam Updated Vital Signs BP (!) 152/81   Pulse 76   Temp 98.5 F (36.9 C) (Oral)   Resp 18   SpO2 97%   Physical Exam  Constitutional: She is oriented to person, place, and time. She appears well-developed and well-nourished.  HENT:  Head: Normocephalic.  Mouth/Throat: Oropharynx is clear and moist.  Eyes: Pupils are equal, round, and reactive to light. Conjunctivae and EOM are normal.  Neck: Normal range of motion. Neck supple.  Cardiovascular: Normal rate, regular rhythm and normal heart sounds.  Pulmonary/Chest: Effort normal and breath sounds normal. No stridor. No respiratory distress. She has no wheezes.  Abdominal: Soft. Bowel sounds are normal. She exhibits no distension. There is no tenderness. There is no guarding.  Musculoskeletal: Normal range of motion.  Neurological: She is alert and oriented to person, place, and time. No cranial  nerve deficit. Coordination normal.  CN 2-12 intact. Nl strength and sensation throughout. Nl gait   Skin: Skin is warm.  Psychiatric: She has a normal mood and affect.  Nursing note and vitals reviewed.    ED Treatments / Results  Labs (all labs ordered are listed, but only abnormal results are displayed) Labs Reviewed  BASIC METABOLIC PANEL - Abnormal; Notable for the following components:      Result Value   Glucose, Bld 131 (*)  BUN 27 (*)    Creatinine, Ser 1.30 (*)    GFR calc non Af Amer 39 (*)    GFR calc Af Amer 45 (*)    All other components within normal limits  CBC - Abnormal; Notable for the following components:   RBC 3.72 (*)    Hemoglobin 11.9 (*)    MCV 100.5 (*)    All other components within normal limits  URINALYSIS, ROUTINE W REFLEX MICROSCOPIC - Abnormal; Notable for the following components:   Color, Urine STRAW (*)    Hgb urine dipstick MODERATE (*)    Squamous Epithelial / LPF 0-5 (*)    All other components within normal limits  I-STAT CHEM 8, ED - Abnormal; Notable for the following components:   BUN 30 (*)    Creatinine, Ser 1.30 (*)    Glucose, Bld 127 (*)    Calcium, Ion 1.13 (*)    All other components within normal limits  I-STAT TROPONIN, ED  CBG MONITORING, ED    EKG EKG Interpretation  Date/Time:  Friday September 06 2017 18:25:43 EDT Ventricular Rate:  80 PR Interval:  148 QRS Duration: 138 QT Interval:  454 QTC Calculation: 523 R Axis:   48 Text Interpretation:  Normal sinus rhythm Non-specific intra-ventricular conduction block Cannot rule out Anterior infarct , age undetermined T wave abnormality, consider inferolateral ischemia Abnormal ECG Confirmed by Lacretia Leigh ([id number]) on 09/06/2017 6:35:23 PM   Radiology Dg Chest 2 View  Result Date: 09/06/2017 CLINICAL DATA:  Altered mental status.  Near syncope. EXAM: CHEST - 2 VIEW COMPARISON:  10/08/2016 FINDINGS: Left-sided pacemaker unchanged. Lungs are adequately inflated  without focal airspace consolidation or effusion. Cardiomediastinal silhouette and remainder of the exam is unchanged. IMPRESSION: No active cardiopulmonary disease. Electronically Signed   By: Marin Olp M.D.   On: 09/06/2017 19:36   Ct Head Wo Contrast  Result Date: 09/06/2017 CLINICAL DATA:  Syncopal episode yesterday morning when getting up and walking to the restroom, fatigue, shortness of breath, history colon cancer, CHF, dementia, hypertension, non ischemic cardiomyopathy. EXAM: CT HEAD WITHOUT CONTRAST TECHNIQUE: Contiguous axial images were obtained from the base of the skull through the vertex without intravenous contrast. Sagittal and coronal MPR images reconstructed from axial data set. COMPARISON:  01/14/2017 FINDINGS: Brain: Normal ventricular morphology. No midline shift or mass effect. Normal appearance of brain parenchyma. No intracranial hemorrhage, mass lesion, or evidence of acute infarction. No extra-axial fluid collections. Vascular: Unremarkable Skull: Intact Sinuses/Orbits: Clear Other: N/A IMPRESSION: Normal exam. Electronically Signed   By: Lavonia Dana M.D.   On: 09/06/2017 21:25    Procedures Procedures (including critical care time)  Medications Ordered in ED Medications - No data to display   Initial Impression / Assessment and Plan / ED Course  I have reviewed the triage vital signs and the nursing notes.  Pertinent labs & imaging results that were available during my care of the patient were reviewed by me and considered in my medical decision making (see chart for details).    ADRINA ARMIJO is a 77 y.o. female here with dizziness, near syncope. Patient has nonfocal neuro exam. She has pacemaker/ defibrillator so will interrogate it to r/o arrhythmias. Will get labs, orthostatics, CT head to r/o bleed. Will reassess.   10:15 PM Pacemaker showed no arrhythmias. Labs, CXR, CT head unremarkable. Trop neg. Stable for discharge. Has cardiology follow up.     Final Clinical Impressions(s) / ED Diagnoses   Final diagnoses:  None  ED Discharge Orders    None       Drenda Freeze, MD 09/06/17 2216

## 2017-09-11 ENCOUNTER — Telehealth: Payer: Self-pay | Admitting: Internal Medicine

## 2017-09-11 NOTE — Telephone Encounter (Signed)
Question  About  Monitor  Lights, yellow,  Unclear,  Pt  Asymptomatic, will call in AM  And  Check  With [employer] if  Any battery issue or  Call clinic If  Any  Symptoms advised to to go to ER

## 2017-09-19 ENCOUNTER — Ambulatory Visit: Payer: Medicare Other | Admitting: Neurology

## 2017-09-22 ENCOUNTER — Other Ambulatory Visit: Payer: Self-pay | Admitting: Nurse Practitioner

## 2017-09-22 DIAGNOSIS — I5023 Acute on chronic systolic (congestive) heart failure: Secondary | ICD-10-CM

## 2017-09-24 ENCOUNTER — Ambulatory Visit: Payer: Medicare Other | Admitting: Neurology

## 2017-09-26 DIAGNOSIS — Z6830 Body mass index (BMI) 30.0-30.9, adult: Secondary | ICD-10-CM | POA: Diagnosis not present

## 2017-09-26 DIAGNOSIS — R05 Cough: Secondary | ICD-10-CM | POA: Diagnosis not present

## 2017-09-26 DIAGNOSIS — J309 Allergic rhinitis, unspecified: Secondary | ICD-10-CM | POA: Diagnosis not present

## 2017-10-09 ENCOUNTER — Ambulatory Visit: Payer: Medicare Other | Admitting: Adult Health

## 2017-10-14 DIAGNOSIS — H1851 Endothelial corneal dystrophy: Secondary | ICD-10-CM | POA: Diagnosis not present

## 2017-10-14 DIAGNOSIS — H4720 Unspecified optic atrophy: Secondary | ICD-10-CM | POA: Diagnosis not present

## 2017-10-14 DIAGNOSIS — H35372 Puckering of macula, left eye: Secondary | ICD-10-CM | POA: Diagnosis not present

## 2017-10-14 DIAGNOSIS — H52203 Unspecified astigmatism, bilateral: Secondary | ICD-10-CM | POA: Diagnosis not present

## 2017-10-15 ENCOUNTER — Telehealth: Payer: Self-pay | Admitting: Neurology

## 2017-10-15 ENCOUNTER — Encounter: Payer: Self-pay | Admitting: Neurology

## 2017-10-15 ENCOUNTER — Ambulatory Visit (INDEPENDENT_AMBULATORY_CARE_PROVIDER_SITE_OTHER): Payer: Medicare Other | Admitting: Neurology

## 2017-10-15 VITALS — BP 109/59 | HR 83 | Ht 63.0 in | Wt 185.0 lb

## 2017-10-15 DIAGNOSIS — G2581 Restless legs syndrome: Secondary | ICD-10-CM | POA: Diagnosis not present

## 2017-10-15 DIAGNOSIS — G250 Essential tremor: Secondary | ICD-10-CM | POA: Diagnosis not present

## 2017-10-15 DIAGNOSIS — Z9989 Dependence on other enabling machines and devices: Secondary | ICD-10-CM | POA: Diagnosis not present

## 2017-10-15 DIAGNOSIS — F0390 Unspecified dementia without behavioral disturbance: Secondary | ICD-10-CM | POA: Diagnosis not present

## 2017-10-15 DIAGNOSIS — G4733 Obstructive sleep apnea (adult) (pediatric): Secondary | ICD-10-CM | POA: Diagnosis not present

## 2017-10-15 MED ORDER — CITALOPRAM HYDROBROMIDE 20 MG PO TABS: ORAL_TABLET | ORAL | 5 refills | Status: DC

## 2017-10-15 MED ORDER — DONEPEZIL HCL 5 MG PO TABS: 5.0000 mg | ORAL_TABLET | Freq: Every day | ORAL | 5 refills | Status: DC

## 2017-10-15 NOTE — Patient Instructions (Signed)
Dementia Dementia is the loss of two or more brain functions, such as:  Memory.  Decision making.  Behavior.  Speaking.  Thinking.  Problem solving.  There are many types of dementia. The most common type is called progressive dementia. Progressive dementia gets worse with time and it is irreversible. An example of this type of dementia is Alzheimer disease. What are the causes? This condition may be caused by:  Nerve cell damage in the brain.  Genetic mutations.  Certain medicines.  Multiple small strokes.  An infection, such as chronic meningitis.  A metabolic problem, such as vitamin B12 deficiency or thyroid disease.  Pressure on the brain, such as from a tumor or blood clot.  What are the signs or symptoms? Symptoms of this condition include:  Sudden changes in mood.  Depression.  Problems with balance.  Changes in personality.  Poor short-term memory.  Agitation.  Delusions.  Hallucinations.  Having a hard time: ? Speaking thoughts. ? Finding words. ? Solving problems. ? Doing familiar tasks. ? Understanding familiar ideas.  How is this diagnosed? This condition is diagnosed with an assessment by your health care provider. During this assessment, your health care provider will talk with you and your family, friends, or caregivers about your symptoms. A thorough medical history will be taken, and you will have a physical exam and tests. Tests may include:  Lab tests, such as blood or urine tests.  Imaging tests, such as a CT scan, PET scan, or MRI.  A lumbar puncture. This test involves removing and testing a small amount of the fluid that surrounds the brain and spinal cord.  An electroencephalogram (EEG). In this test, small metal discs are used to measure electrical activity in the brain.  Memory tests, cognitive tests, and neuropsychological tests. These tests evaluate brain function.  How is this treated? Treatment depends on the  cause of the dementia. It may involve taking medicines that may help:  To control the dementia.  To slow down the disease.  To manage symptoms.  In some cases, treating the cause of the dementia can improve symptoms, reverse symptoms, or slow down how quickly the dementia gets worse. Your health care provider can help direct you to support groups, organizations, and other health care providers who can help with decisions about your care. Follow these instructions at home: Medicine  Take over-the-counter and prescription medicines only as told by your health care provider.  Avoid taking medicines that can affect thinking, such as pain or sleeping medicines. Lifestyle   Make healthy lifestyle choices: ? Be physically active as told by your health care provider. ? Do not use any tobacco products, such as cigarettes, chewing tobacco, and e-cigarettes. If you need help quitting, ask your health care provider. ? Eat a healthy diet. ? Practice stress-management techniques when you get stressed. ? Stay social.  Drink enough fluid to keep your urine clear or pale yellow.  Make sure to get quality sleep. These tips can help you to get a good night's rest: ? Avoid napping during the day. ? Keep your sleeping area dark and cool. ? Avoid exercising during the few hours before you go to bed. ? Avoid caffeine products in the evening. General instructions  Work with your health care provider to determine what you need help with and what your safety needs are.  If you were given a bracelet that tracks your location, make sure to wear it.  Keep all follow-up visits as told by your   health care provider. This is important. Contact a health care provider if:  You have any new symptoms.  You have problems with choking or swallowing.  You have any symptoms of a different illness. Get help right away if:  You develop a fever.  You have new or worsening confusion.  You have new or  worsening sleepiness.  You have a hard time staying awake.  You or your family members become concerned for your safety. This information is not intended to replace advice given to you by your health care provider. Make sure you discuss any questions you have with your health care provider. Document Released: 11/28/2000 Document Revised: 10/13/2015 Document Reviewed: 03/02/2015 Elsevier Interactive Patient Education  2018 [employer]vier Inc[employer]  

## 2017-10-15 NOTE — Progress Notes (Signed)
PATIENT: Samantha [name] DOB: 10-06-1940  REASON FOR VISIT: follow up HISTORY FROM: patient and son " Gerald Stabs" .   HISTORY OF PRESENT ILLNESS: Today 10/15/17 , I have a pleasure of seeing [name], meanwhile 77 year old Caucasian female patient whom I have followed for restless legs and sleep apnea.  She also has reported concerns about decreasing cognitive function and feels that the last 12 months has been up.  During which her memory has decreased.  She is here today with her son who confirms these observations.  I would like to briefly "the CPAP compliance which has been 83% average of 8 hours 28 minutes at night, CPAP is set at 9 cmH2O was to submit a EPR and residual AHI is still 6.4 almost equal amounts of obstructive and central apnea noticed not many leaks are seen.  I think she may benefit from a slight increase in CPAP pressure.  She also endorsed the geriatric depression score at a high rate 9 out of 15 points.  She endorsed the Epworth Sleepiness Scale at 6 points and the fatigue severity score at 47 points. Back to her chief concern of cognitive decline Mrs. Henrikson states that she has been sometimes unaware of the correct date, she was surprised to learn that tomorrow is 1 May.  She reports that she misplaces items more than usual, that she sometimes gets quite frustrated and agitated and she cannot find items.  The patient still handles all her financial affairs herself, she does not notice any difficulties with balancing her checkbook for example.  She has self limited her driving and only drives in her residential area, for this doctor's appointment for example her son has brought her for other doctor's appointments but even to go to church she is usually accompanied by her daughter.    Megan millikan note, undated : [name] is a 77 year old female with a history of obstructive sleep apnea on CPAP as well as memory disturbance. She returns today for follow-up.  Her CPAP  download indicates that she uses her machine 25 out of 30 days for compliance of 83%. She uses her machine greater than 4 hours 23 out of 30 days for compliance of 77%. On average she uses her machine 10 hours and 10 minutes. Her residual AHI is 6.4 on 9 cm water with EPR 2. She does not have a significant leak. She does feel that the CPAP has been beneficial for her. She reports her daytime sleepiness has improved. She still notes some fatigue however she is unsure if this is more of a cardiac issue. She returns today for an evaluation.     REVIEW OF SYSTEMS: Out of a complete 14 system review of symptoms, the patient complains only of the following symptoms, and all other reviewed systems are MCI, agitation, RLS, twitching, sleeps well. Depression according to geriatric depression score- 7/ 15.    ALLERGIES: Allergies  Allergen Reactions  . Codeine     Upset stomach    . Iohexol Hives     Desc: Pt. states she broke out in hives 30 yrs ago w/ a CT Brain scan.  She has since had a heart cath and this CT today (08/10/05) w/o premeds and did well. No evident reaction.  . Shellfish Allergy Itching    Past history of itching with shrimp, has a history of allergy shots , no problem with seafood in years per patient (??)    HOME MEDICATIONS: Outpatient Medications Prior to  Visit  Medication Sig Dispense Refill  . carvedilol (COREG) 12.5 MG tablet Take 1 tablet (12.5 mg total) by mouth 2 (two) times daily. 180 tablet 3  . Cholecalciferol (VITAMIN D3) 2000 units TABS Take 2,000 Units by mouth daily.    . citalopram (CELEXA) 40 MG tablet Take 40 mg by mouth daily.     Marland Kitchen loratadine (CLARITIN) 10 MG tablet Take 10 mg by mouth daily.    Marland Kitchen losartan (COZAAR) 25 MG tablet Take 25 mg by mouth daily.    . memantine (NAMENDA) 10 MG tablet Take 10 mg by mouth 2 (two) times daily.  1  . rOPINIRole (REQUIP) 0.5 MG tablet TAKE 1 TABLET (0.5 MG TOTAL) BY MOUTH AT BEDTIME. 90 tablet 3  . furosemide (LASIX) 20  MG tablet Take 1 tablet (20 mg total) by mouth daily. 90 tablet 3   No facility-administered medications prior to visit.     PAST MEDICAL HISTORY: Past Medical History:  Diagnosis Date  . Anemia   . 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 Cancer of colon ([place]) 08/2005   STAGE 1 CHEMO AND RADIATION  . Chronic systolic CHF (congestive heart failure) ([place])   . Colitis   . Dementia   . Febrile neutropenia ([place])   . Hypertension   . Hypokalemia   . LBBB (left bundle branch block)   . Movement disorder   . Mucositis   . Nonischemic cardiomyopathy ([place])    a. prior varying EF, 30% in 2017. s/p BSX CRTD 05/2016.  Marland Kitchen OSA (obstructive sleep apnea)   . 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 S/P cardiac cath 2000   Normal coronaries  . Sleep apnea    uses CPAP nightly    PAST SURGICAL HISTORY: Past Surgical History:  Procedure Laterality Date  . APPENDECTOMY    . CARDIAC CATHETERIZATION    . CARPAL TUNNEL RELEASE Left 09/01/2015   Procedure: LEFT CARPAL TUNNEL RELEASE;  Surgeon: Leanora Cover, MD;  Location: [facility];  Service: Orthopedics;  Laterality: Left;  . cataract surgery removal     bilateral  . CESAREAN SECTION    . CHOLECYSTECTOMY    . EP IMPLANTABLE DEVICE N/A 06/15/2016   Procedure: BiV ICD Insertion CRT-D;  Surgeon: Evans Lance, MD;  Location: [facility];  Service: Cardiovascular;  Laterality: N/A;  . HEMORROIDECTOMY    . OPEN REDUCTION INTERNAL FIXATION (ORIF) DISTAL RADIAL FRACTURE Left 04/28/2015   Procedure: OPEN REDUCTION INTERNAL FIXATION (ORIF) LEFT DISTAL RADIUS FRACTURE;  Surgeon: Leanora Cover, MD;  Location: [facility];  Service: Orthopedics;  Laterality: Left;  . PORT-A-CATH REMOVAL    . TONSILLECTOMY    . TUBAL LIGATION    . US ECHOCARDIOGRAPHY  3/12   EF 30 to 35%    FAMILY HISTORY: Family History  Problem Relation Age of Onset  . Diabetes Sister   . Cancer Brother         PROSTATE    SOCIAL HISTORY: Social History   Socioeconomic History  . Marital status: Divorced    Spouse name: Not on file  . Number of children: 2  . Years of education: 67  . Highest education level: Not on file  Occupational History    Employer: RETIRED  . Occupation: Retired  Scientific laboratory technician  . Financial resource strain: Not on file  . Food insecurity:    Worry: Not on file    Inability: Not on file  . Transportation  needs:    Medical: Not on file    Non-medical: Not on file  Tobacco Use  . Smoking status: Never Smoker  . Smokeless tobacco: Never Used  Substance and Sexual Activity  . Alcohol use: No    Alcohol/week: 0.0 oz  . Drug use: No  . Sexual activity: Never    Comment: 1st intercourse 77 yo-More than 5 partners  Lifestyle  . Physical activity:    Days per week: Not on file    Minutes per session: Not on file  . Stress: Not on file  Relationships  . Social connections:    Talks on phone: Not on file    Gets together: Not on file    Attends religious service: Not on file    Active member of club or organization: Not on file    Attends meetings of clubs or organizations: Not on file    Relationship status: Not on file  . Intimate partner violence:    Fear of current or ex partner: Not on file    Emotionally abused: Not on file    Physically abused: Not on file    Forced sexual activity: Not on file  Other Topics Concern  . Not on file  Social History Narrative   Patient lives at home alone.    Patient has 2 children.    Patient is retired.    Patient has a high school education.    Patient is divorced.    Patient is right handed.       PHYSICAL EXAM  Vitals:   10/15/17 1521  BP: (!) 109/59  Pulse: 83  Weight: 185 lb (83.9 kg)  Height: 5\' 3"  (1.6 m)   Body mass index is 32.77 kg/m.  Generalized: Well developed, in no acute distress, no ankle edema.   Neurological examination  Mentation: Alert  Follows all commands speech and language  fluent  MMSE - Mini Mental State Exam 10/15/2017 02/25/2017 10/22/2016  Orientation to time 4 5 4   Orientation to Place 5 5 5   Registration 3 3 3   Attention/ Calculation 3 1 5   Attention/Calculation-comments refused to do numbers - -  Recall 0 1 0  Language- name 2 objects 2 2 2   Language- repeat 1 1 1   Language- follow 3 step command 3 3 3   Language- read & follow direction 0 1 1  Write a sentence 0 1 1  Copy design 0 1 1  Total score 21 24 26      Cranial nerve : taste and smell felt to be normal. Pupils were equal round reactive to light. Extraocular movements were full, visual field were full on confrontational test. Facial sensation and strength were normal. Uvula midline,  tongue with tremor .Head turning and shoulder shrug were symmetric. Motor:  5/ 5 strength of all 4 extremities, symmetric motor tone is noted throughout.  Sensory: No evidence of extinction is noted.  Coordination:  finger-nose-finger with tremor, overall restless. She has always had these ongoing movements.  Gait and station: The patient has a mild truncal tremor as she walks her step with his normal but it takes her 5 steps to turn 180 degrees.  She does have a normal arm swing but there is noticeable tremor over the hands and some rigidity over the wrist.  Her right leg seems to lag and movement and she feels insecure if the right leg bears all her weight.  Her tandem gait is disturbed, as his heel or toe walk.  She  reports that at home she sometimes holds on his her right hand to the wall as she walks.This fits the weakness of the right lower extremity  DIAGNOSTIC DATA (LABS, IMAGING, TESTING) - I reviewed patient records, labs, notes, testing and imaging myself where available.  CLINICAL DATA:  Memory loss, dementia  EXAM: CT HEAD WITHOUT CONTRAST  TECHNIQUE: Contiguous axial images were obtained from the base of the skull through the vertex without intravenous contrast.  COMPARISON:  None  available  FINDINGS: Brain: Age related brain atrophy pattern and minor periventricular chronic white matter microvascular ischemic changes throughout both cerebral hemispheres. No acute intracranial hemorrhage, acute infarction, mass lesion, midline shift, herniation, hydrocephalus, or extra-axial fluid collection. No focal mass effect or edema. Cisterns are patent. Cerebellar atrophy as well.  Vascular: No hyperdense vessel or unexpected calcification.  Skull: Normal. Negative for fracture or focal lesion.  Sinuses/Orbits: No acute finding.  Other: None.  IMPRESSION: Age related brain atrophy pattern and minor chronic white matter microvascular changes.  No acute intracranial abnormality by noncontrast CT.   Electronically Signed   By: Jerilynn Mages.  Shick M.D.   On: 01/14/2017 16:26  CLINICAL DATA:  Syncopal episode yesterday morning when getting up and walking to the restroom, fatigue, shortness of breath, history colon cancer, CHF, dementia, hypertension, non ischemic cardiomyopathy.  EXAM: CT HEAD WITHOUT CONTRAST  TECHNIQUE: Contiguous axial images were obtained from the base of the skull through the vertex without intravenous contrast. Sagittal and coronal MPR images reconstructed from axial data set.  COMPARISON:  01/14/2017  FINDINGS: Brain: Normal ventricular morphology. No midline shift or mass effect. Normal appearance of brain parenchyma. No intracranial hemorrhage, mass lesion, or evidence of acute infarction. No extra-axial fluid collections.  Vascular: Unremarkable  Skull: Intact  Sinuses/Orbits: Clear  Other: N/A  IMPRESSION: Normal exam.   Electronically Signed   By: Lavonia Dana M.D.   On: 09/06/2017 21:25   08-30-2017 evaluation for black out period in ED- labs trandferred, Echo , EKG, repeat CT.    Lab Results  Component Value Date   WBC 7.0 09/06/2017   HGB 12.6 09/06/2017   HCT 37.0 09/06/2017   MCV 100.5 (H)  09/06/2017   PLT 192 09/06/2017      Component Value Date/Time   NA 140 09/06/2017 1848   NA 141 10/03/2016 1355   NA 141 10/01/2011 1302   K 4.3 09/06/2017 1848   K 4.3 10/01/2011 1302   CL 103 09/06/2017 1848   CL 100 10/01/2011 1302   CO2 25 09/06/2017 1830   CO2 30 10/01/2011 1302   GLUCOSE 127 (H) 09/06/2017 1848   GLUCOSE 93 10/01/2011 1302   BUN 30 (H) 09/06/2017 1848   BUN 17 10/03/2016 1355   BUN 18 10/01/2011 1302   CREATININE 1.30 (H) 09/06/2017 1848   CREATININE 1.17 (H) 06/05/2016 1549   CALCIUM 9.2 09/06/2017 1830   CALCIUM 8.8 10/01/2011 1302   PROT 6.8 11/25/2014 1427   PROT 7.2 10/01/2011 1302   ALBUMIN 4.5 11/25/2014 1427   AST 15 11/25/2014 1427   AST 22 10/01/2011 1302   ALT 17 11/25/2014 1427   ALT 23 10/01/2011 1302   ALKPHOS 71 11/25/2014 1427   ALKPHOS 69 10/01/2011 1302   BILITOT 0.3 11/25/2014 1427   BILITOT 0.40 10/01/2011 1302   GFRNONAA 39 (L) 09/06/2017 1830   GFRAA 45 (L) 09/06/2017 1830   Lab Results  Component Value Date   CHOL 185 03/10/2013   HDL 57.30 03/10/2013  LDLCALC 110 (H) 03/10/2013   TRIG 91.0 03/10/2013   CHOLHDL 3 03/10/2013    Lab Results  Component Value Date   TSH 1.872 11/27/2010      ASSESSMENT AND PLAN 77 y.o. year old female patient with multile neurological concerns.   1. OSA on CPAP- good comliance but high AHI, will increase pressure by 1 cm water.  2. Memory disturbance- MCI or early dementia. She can't have MRI because of defibrillator implant . Labs -2 maternal of the patient have dementia, and her mother died with dementia.  I will order TSH vitamin B12 RPR.  She does not need another CBC was done for another metabolic panel as she just had these evaluations in mid-March. 3. Gait disturbance with truncal tremor.  4. Syncope - lack out spell unrelated to cardiac function. 08-30-2017 evaluation reviewed.   The patient was started on Namenda for her memory 2018. She will continue 10 mg twice a day.  She will follow-up in 6 months with NP  or sooner if needed.   Larey Seat, MD  10/15/2017, 3:46 PM [facility] [address], [place], [place] [address] [phone]

## 2017-10-15 NOTE — Telephone Encounter (Signed)
Shiny with [facility] calling regarding Rx received for donepezil (ARICEPT) 5 MG tablet and the interaction with citalopram (CELEXA) 20 MG tablet. Please call to discuss-telephone# [phone].

## 2017-10-16 ENCOUNTER — Telehealth: Payer: Self-pay | Admitting: Neurology

## 2017-10-16 LAB — TSH+FREE T4
Free T4: 1.28 ng/dL (ref 0.82–1.77)
TSH: 1.58 u[IU]/mL (ref 0.450–4.500)

## 2017-10-16 LAB — RPR: RPR Ser Ql: NONREACTIVE

## 2017-10-16 LAB — VITAMIN B12: Vitamin B-12: 334 pg/mL (ref 232–1245)

## 2017-10-16 NOTE — Telephone Encounter (Signed)
-----   Message from Larey Seat, MD sent at 10/16/2017  1:06 PM EDT ----- Normal TSH, RPR negative ( nl) , B 12 level in lower normal range.

## 2017-10-16 NOTE — Telephone Encounter (Signed)
Pt returning RNs call, aware that her lab work was normal appreciated the call.

## 2017-10-16 NOTE — Telephone Encounter (Signed)
Called to make the pt aware of the normal results and that Dr Brett Fairy had no concerns. No answer. LVM with this information. Informed pt if she has any questions to call back. If pt returns call please let her know that her lab work from yesterday was normal.

## 2017-10-16 NOTE — Telephone Encounter (Addendum)
Called the pharmacy back and they wanted to make sure it was aware that this medication taken together could cause heart arhythmia. I instructed that we will place a hold and not fill the aricept at this time until we hear if the pt is a potential candidate for a memory trial. If she is not and Dr Brett Fairy decides to start her on this I will make sure Dr Brett Fairy is aware of this. Pharmacy noted not to fill at this time in her system.  I have also called the patient and made her aware of this medication being placed on hold. Pt verbalized understanding.

## 2017-11-05 ENCOUNTER — Other Ambulatory Visit: Payer: Self-pay | Admitting: Nurse Practitioner

## 2017-11-05 DIAGNOSIS — I5023 Acute on chronic systolic (congestive) heart failure: Secondary | ICD-10-CM

## 2017-11-13 DIAGNOSIS — E559 Vitamin D deficiency, unspecified: Secondary | ICD-10-CM | POA: Diagnosis not present

## 2017-11-13 DIAGNOSIS — M859 Disorder of bone density and structure, unspecified: Secondary | ICD-10-CM | POA: Diagnosis not present

## 2017-11-13 DIAGNOSIS — R82998 Other abnormal findings in urine: Secondary | ICD-10-CM | POA: Diagnosis not present

## 2017-11-13 DIAGNOSIS — I10 Essential (primary) hypertension: Secondary | ICD-10-CM | POA: Diagnosis not present

## 2017-11-19 ENCOUNTER — Ambulatory Visit (INDEPENDENT_AMBULATORY_CARE_PROVIDER_SITE_OTHER): Payer: Medicare Other | Admitting: *Deleted

## 2017-11-19 DIAGNOSIS — I5022 Chronic systolic (congestive) heart failure: Secondary | ICD-10-CM | POA: Diagnosis not present

## 2017-11-19 NOTE — Progress Notes (Signed)
Remote ICD transmission.   

## 2017-11-26 ENCOUNTER — Other Ambulatory Visit: Payer: Self-pay | Admitting: Gynecology

## 2017-11-27 NOTE — Telephone Encounter (Signed)
In her historical med list you have refilled this Rx for her every year since 2013.  I spoke with patient. 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

## 2017-11-27 NOTE — Telephone Encounter (Signed)
Check with patient.  I do not see where I have prescribed this previously (which I may have) nor do I see listed in her problem/history lists where she is having issues with HSV.

## 2017-11-28 DIAGNOSIS — Z6830 Body mass index (BMI) 30.0-30.9, adult: Secondary | ICD-10-CM | POA: Diagnosis not present

## 2017-11-28 DIAGNOSIS — D638 Anemia in other chronic diseases classified elsewhere: Secondary | ICD-10-CM | POA: Diagnosis not present

## 2017-11-28 DIAGNOSIS — G2581 Restless legs syndrome: Secondary | ICD-10-CM | POA: Diagnosis not present

## 2017-11-28 DIAGNOSIS — R413 Other amnesia: Secondary | ICD-10-CM | POA: Diagnosis not present

## 2017-11-28 DIAGNOSIS — R7309 Other abnormal glucose: Secondary | ICD-10-CM | POA: Diagnosis not present

## 2017-11-28 DIAGNOSIS — Z0000 Encounter for general adult medical examination without abnormal findings: Secondary | ICD-10-CM | POA: Diagnosis not present

## 2017-11-28 DIAGNOSIS — Z1389 Encounter for screening for other disorder: Secondary | ICD-10-CM | POA: Diagnosis not present

## 2017-11-28 DIAGNOSIS — I509 Heart failure, unspecified: Secondary | ICD-10-CM | POA: Diagnosis not present

## 2017-11-28 DIAGNOSIS — I110 Hypertensive heart disease with heart failure: Secondary | ICD-10-CM | POA: Diagnosis not present

## 2017-11-28 DIAGNOSIS — F321 Major depressive disorder, single episode, moderate: Secondary | ICD-10-CM | POA: Diagnosis not present

## 2017-11-28 DIAGNOSIS — N3941 Urge incontinence: Secondary | ICD-10-CM | POA: Diagnosis not present

## 2017-11-28 DIAGNOSIS — I10 Essential (primary) hypertension: Secondary | ICD-10-CM | POA: Diagnosis not present

## 2017-12-02 DIAGNOSIS — Z1212 Encounter for screening for malignant neoplasm of rectum: Secondary | ICD-10-CM | POA: Diagnosis not present

## 2018-01-01 ENCOUNTER — Other Ambulatory Visit: Payer: Self-pay | Admitting: Neurology

## 2018-01-09 LAB — CUP PACEART REMOTE DEVICE CHECK
Date Time Interrogation Session: 20190725091211
Implantable Lead Implant Date: 20171229
Implantable Lead Implant Date: 20171229
Implantable Lead Implant Date: 20171229
Implantable Lead Location: 753858
Implantable Lead Location: 753859
Implantable Lead Location: 753860
Implantable Lead Model: 292
Implantable Lead Model: 4677
Implantable Lead Model: 7740
Implantable Lead Serial Number: 423617
Implantable Lead Serial Number: 604122
Implantable Lead Serial Number: 686437
Implantable Pulse Generator Implant Date: 20171229
Pulse Gen Serial Number: 169900

## 2018-02-13 ENCOUNTER — Encounter: Payer: Self-pay | Admitting: Cardiovascular Disease

## 2018-02-13 ENCOUNTER — Ambulatory Visit (INDEPENDENT_AMBULATORY_CARE_PROVIDER_SITE_OTHER): Payer: Medicare Other | Admitting: Cardiovascular Disease

## 2018-02-13 VITALS — BP 118/58 | HR 85 | Ht 65.0 in | Wt 185.4 lb

## 2018-02-13 DIAGNOSIS — I447 Left bundle-branch block, unspecified: Secondary | ICD-10-CM | POA: Diagnosis not present

## 2018-02-13 DIAGNOSIS — I10 Essential (primary) hypertension: Secondary | ICD-10-CM

## 2018-02-13 DIAGNOSIS — I5022 Chronic systolic (congestive) heart failure: Secondary | ICD-10-CM | POA: Diagnosis not present

## 2018-02-13 NOTE — Progress Notes (Signed)
Cardiology Office Note   Date:  02/13/2018   ID:  Samantha [name], DOB [birth year], MRN [id number]  PCP:  Haywood Pao, MD  Cardiologist:   Mertie Moores, MD   Chief Complaint  Patient presents with  . Congestive Heart Failure   1. Congestive heart failure- original EF 30-35%, now EF = 45-50% 2. History colon cancer-status post radiation and chemotherapy-April 2007 3. Smooth and normal coronary arteries by heart catheterization in 2000 4. OSA - wears CPAP      She has done well from a cardiac stand point. She has had some dizziness and found her BP to be low yesterday. She has occasional palpitations and frequently finds that her heart rate is about 100 with minimal exertion.  September 18, 2012:  Samantha [name] is doing well. Still has some dyspnea - especially when bending over or stooping over. No chest pain. She eats an ice cream sundae every day, sometimes twice a day. She still eats fast food almost all of the time. She had an EF of 35% several years ago. Her EF has improved to 45-50% with medical therapy.   Sept. 22, 2014:  Samantha [name] is doing well from a cardiac standpoint. She has been found to have a lung nodule.  She has occasional episodes of dyspnea - especially with exertion. She is still eating salty foods - she salts everything that she eats.   October 05, 2013:  Doing well from a cardiac standpoint. No CP, Does have some dyspnea. Has been out working out in the yard without problems.   Oct. 28, 2015:  Samantha [name] is doing well. She finds occasional elevated BP.  She still eats salt. Which likely explains her elevated BP   Oct 18, 2014:  Samantha [name] is a 77 y.o. female who presents for follow up of her CHF. She has floaters in her visual field this am Breathing is ok. Occasionally gets short of breath.    Nov. 8, 2016: Doing ok.   Has lost weight . Breathing is ok  Oct 31, 2015: Doing well from a cardiac standpoint.  Fatigued   Nov. 13, 2017:     Having problems with nervousness. Has some cold sweats between her shoulders- night sweats. Has lost 17 lbs over the past 6 months  Has had some bleeding in her stool  ( has appt with Dr. Collene Mares tomorrow )   No CP Gets short of breath at times.   Does not get any exercise Last echo was in 2012 - EF 45-50% at that time   Nov 05, 2016:  No cardiac complaints  Her LV lead on her BI-V pacer is now turned off - it had migrated and was diaphragmatic pacing . So will see Dr. Lovena Le in July  Is having some shortness of breath   Is now back on Coreg instead of Bystolic   Still eats salt -   Has had bad diarrhea for the past several weeks  July 29, 2017: Seen with daughter ,  Samantha [name]  Is still short of breath with any activity  Only exercise is walking to the mailbox and back ( 50-75 feet)  Is not inclined to   Aug. 29, 2019:  Doing well. Has been having some night sweats for the past several weeks  Has not discussed with Dr. Odette Fraction  No signs of infection  Hx of colon cancer in the past.     Past Medical History:  Diagnosis Date  . Anemia   .  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 Cancer of colon ([place]) 08/2005   STAGE 1 CHEMO AND RADIATION  . Chronic systolic CHF (congestive heart failure) ([place])   . Colitis   . Dementia   . Febrile neutropenia ([place])   . Hypertension   . Hypokalemia   . LBBB (left bundle branch block)   . Movement disorder   . Mucositis   . Nonischemic cardiomyopathy ([place])    a. prior varying EF, 30% in 2017. s/p BSX CRTD 05/2016.  Marland Kitchen OSA (obstructive sleep apnea)   . 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 S/P cardiac cath 2000   Normal coronaries  . Sleep apnea    uses CPAP nightly    Past Surgical History:  Procedure Laterality Date  . APPENDECTOMY    . CARDIAC CATHETERIZATION    . CARPAL TUNNEL RELEASE Left 09/01/2015   Procedure: LEFT CARPAL TUNNEL RELEASE;  Surgeon: Leanora Cover, MD;  Location: [facility];  Service: Orthopedics;  Laterality: Left;  . cataract surgery removal     bilateral  . CESAREAN SECTION    . CHOLECYSTECTOMY    . EP IMPLANTABLE DEVICE N/A 06/15/2016   Procedure: BiV ICD Insertion CRT-D;  Surgeon: Evans Lance, MD;  Location: [facility];  Service: Cardiovascular;  Laterality: N/A;  . HEMORROIDECTOMY    . OPEN REDUCTION INTERNAL FIXATION (ORIF) DISTAL RADIAL FRACTURE Left 04/28/2015   Procedure: OPEN REDUCTION INTERNAL FIXATION (ORIF) LEFT DISTAL RADIUS FRACTURE;  Surgeon: Leanora Cover, MD;  Location: [facility];  Service: Orthopedics;  Laterality: Left;  . PORT-A-CATH REMOVAL    . TONSILLECTOMY    . TUBAL LIGATION    . US ECHOCARDIOGRAPHY  3/12   EF 30 to 35%     Current Outpatient Medications  Medication Sig Dispense Refill  . carvedilol (COREG) 12.5 MG tablet Take 1 tablet (12.5 mg total) by mouth 2 (two) times daily. 180 tablet 3  . Cholecalciferol (VITAMIN D3) 2000 units TABS Take 2,000 Units by mouth daily.    . citalopram (CELEXA) 20 MG tablet TAKE ONE TABLET DAILY BY MOUTH. 90 tablet 2  . fluticasone (FLONASE) 50 MCG/ACT nasal spray USE 2 SPRAYS EACH NOSTRIL DAILY FOR ALLERGIES. USE DAILY  6  . furosemide (LASIX) 20 MG tablet TAKE 1 TABLET (20 MG TOTAL) BY MOUTH DAILY. 90 tablet 3  . loratadine (CLARITIN) 10 MG tablet Take 10 mg by mouth daily.    Marland Kitchen losartan (COZAAR) 25 MG tablet Take 25 mg by mouth daily.    . memantine (NAMENDA) 10 MG tablet Take 10 mg by mouth 2 (two) times daily.  1  . rOPINIRole (REQUIP) 0.5 MG tablet TAKE 1 TABLET (0.5 MG TOTAL) BY MOUTH AT BEDTIME. 90 tablet 3   No current facility-administered medications for this visit.     Allergies:   Codeine; Iohexol; and Shellfish allergy    Social History:  The patient  reports that she has never smoked. She has never used smokeless tobacco. She reports that she does not drink alcohol or use drugs.   Family History:  The patient's family history  includes Cancer in her brother; Diabetes in her sister.    ROS:  Noted in current history, otherwise systems are negative.  Physical Exam: Blood pressure (!) 118/58, pulse 85, height 5\' 5"  (1.651 m), weight 185 lb 6.4 oz (84.1 kg), SpO2 95 %.  GEN:  Well nourished, well developed in no acute distress HEENT: Normal NECK:  No JVD; No carotid bruits LYMPHATICS: No lymphadenopathy CARDIAC: RRR   RESPIRATORY:  Clear to auscultation without rales, wheezing or rhonchi  ABDOMEN: Soft, non-tender, non-distended MUSCULOSKELETAL:  No edema; No deformity  SKIN: Warm and dry NEUROLOGIC:  Alert and oriented x 3   EKG:  EKG is not ordered today.      Recent Labs: 09/06/2017: BUN 30; Creatinine, Ser 1.30; Hemoglobin 12.6; Platelets 192; Potassium 4.3; Sodium 140 10/15/2017: TSH 1.580    Lipid Panel    Component Value Date/Time   CHOL 185 03/10/2013 1142   TRIG 91.0 03/10/2013 1142   HDL 57.30 03/10/2013 1142   CHOLHDL 3 03/10/2013 1142   VLDL 18.2 03/10/2013 1142   LDLCALC 110 (H) 03/10/2013 1142      Wt Readings from Last 3 Encounters:  02/13/18 185 lb 6.4 oz (84.1 kg)  10/15/17 185 lb (83.9 kg)  07/29/17 185 lb 12.8 oz (84.3 kg)      Other studies Reviewed: Additional studies/ records that were reviewed today include: . Review of the above records demonstrates:    ASSESSMENT AND PLAN:  1. Congestive heart failure- original EF 30-35%, now EF = 45-50%,    Has DOE  Is on Lasix 20 mg a day . Still eating salty foods. Advised her to cut back her salt    2. History colon cancer-status post radiation and chemotherapy-April 2007 Has been having some night sweats.  I have advised her to talk with her primary medical doctor about her night sweats.   3. Smooth and normal coronary arteries by heart catheterization in 2000  4. Anxiety:      5. Night sweats and weight loss:   I have advised her to talk to her primary medical doctor about this.  Current medicines are  reviewed at length with the patient today.  The patient does not have concerns regarding medicines.  The following changes have been made:  no change  Labs/ tests ordered today include:  No orders of the defined types were placed in this encounter.   Disposition:   FU with me in 6 months      Mertie Moores, MD  02/13/2018 1:56 PM    [facility] [address], [place], [place]  [address] Phone: [phone]; Fax: [phone]

## 2018-02-13 NOTE — Patient Instructions (Signed)

## 2018-02-18 ENCOUNTER — Ambulatory Visit (INDEPENDENT_AMBULATORY_CARE_PROVIDER_SITE_OTHER): Payer: Medicare Other | Admitting: *Deleted

## 2018-02-18 DIAGNOSIS — I5023 Acute on chronic systolic (congestive) heart failure: Secondary | ICD-10-CM

## 2018-02-18 DIAGNOSIS — I447 Left bundle-branch block, unspecified: Secondary | ICD-10-CM

## 2018-02-18 NOTE — Progress Notes (Signed)
Remote ICD transmission.   

## 2018-02-19 DIAGNOSIS — M1990 Unspecified osteoarthritis, unspecified site: Secondary | ICD-10-CM | POA: Diagnosis not present

## 2018-02-19 DIAGNOSIS — I10 Essential (primary) hypertension: Secondary | ICD-10-CM | POA: Diagnosis not present

## 2018-02-19 DIAGNOSIS — Z6830 Body mass index (BMI) 30.0-30.9, adult: Secondary | ICD-10-CM | POA: Diagnosis not present

## 2018-02-19 DIAGNOSIS — G4733 Obstructive sleep apnea (adult) (pediatric): Secondary | ICD-10-CM | POA: Diagnosis not present

## 2018-02-19 DIAGNOSIS — R61 Generalized hyperhidrosis: Secondary | ICD-10-CM | POA: Diagnosis not present

## 2018-02-19 DIAGNOSIS — I509 Heart failure, unspecified: Secondary | ICD-10-CM | POA: Diagnosis not present

## 2018-03-11 LAB — CUP PACEART REMOTE DEVICE CHECK
Date Time Interrogation Session: 20190924084844
Implantable Lead Implant Date: 20171229
Implantable Lead Implant Date: 20171229
Implantable Lead Implant Date: 20171229
Implantable Lead Location: 753858
Implantable Lead Location: 753859
Implantable Lead Location: 753860
Implantable Lead Model: 292
Implantable Lead Model: 4677
Implantable Lead Model: 7740
Implantable Lead Serial Number: 423617
Implantable Lead Serial Number: 604122
Implantable Lead Serial Number: 686437
Implantable Pulse Generator Implant Date: 20171229
Pulse Gen Serial Number: 169900

## 2018-03-13 DIAGNOSIS — G2581 Restless legs syndrome: Secondary | ICD-10-CM | POA: Diagnosis not present

## 2018-03-13 DIAGNOSIS — F321 Major depressive disorder, single episode, moderate: Secondary | ICD-10-CM | POA: Diagnosis not present

## 2018-03-13 DIAGNOSIS — I10 Essential (primary) hypertension: Secondary | ICD-10-CM | POA: Diagnosis not present

## 2018-03-13 DIAGNOSIS — R413 Other amnesia: Secondary | ICD-10-CM | POA: Diagnosis not present

## 2018-03-13 DIAGNOSIS — R159 Full incontinence of feces: Secondary | ICD-10-CM | POA: Diagnosis not present

## 2018-03-13 DIAGNOSIS — I509 Heart failure, unspecified: Secondary | ICD-10-CM | POA: Diagnosis not present

## 2018-03-13 DIAGNOSIS — Z6830 Body mass index (BMI) 30.0-30.9, adult: Secondary | ICD-10-CM | POA: Diagnosis not present

## 2018-03-13 DIAGNOSIS — Z23 Encounter for immunization: Secondary | ICD-10-CM | POA: Diagnosis not present

## 2018-03-20 DIAGNOSIS — R194 Change in bowel habit: Secondary | ICD-10-CM | POA: Diagnosis not present

## 2018-03-20 DIAGNOSIS — Z85048 Personal history of other malignant neoplasm of rectum, rectosigmoid junction, and anus: Secondary | ICD-10-CM | POA: Diagnosis not present

## 2018-03-20 DIAGNOSIS — Z86010 Personal history of colonic polyps: Secondary | ICD-10-CM | POA: Diagnosis not present

## 2018-03-20 DIAGNOSIS — R197 Diarrhea, unspecified: Secondary | ICD-10-CM | POA: Diagnosis not present

## 2018-03-24 ENCOUNTER — Encounter: Payer: Self-pay | Admitting: Adult Health

## 2018-04-01 DIAGNOSIS — F321 Major depressive disorder, single episode, moderate: Secondary | ICD-10-CM | POA: Diagnosis not present

## 2018-04-01 DIAGNOSIS — F419 Anxiety disorder, unspecified: Secondary | ICD-10-CM | POA: Diagnosis not present

## 2018-04-22 ENCOUNTER — Encounter: Payer: Medicare Other | Admitting: Gynecology

## 2018-04-22 DIAGNOSIS — Z0289 Encounter for other administrative examinations: Secondary | ICD-10-CM

## 2018-04-24 ENCOUNTER — Encounter: Payer: Self-pay | Admitting: Adult Health

## 2018-04-24 ENCOUNTER — Ambulatory Visit (INDEPENDENT_AMBULATORY_CARE_PROVIDER_SITE_OTHER): Payer: Medicare Other | Admitting: Adult Health

## 2018-04-24 VITALS — BP 101/58 | HR 90 | Ht 65.0 in | Wt 187.2 lb

## 2018-04-24 DIAGNOSIS — Z9989 Dependence on other enabling machines and devices: Secondary | ICD-10-CM | POA: Diagnosis not present

## 2018-04-24 DIAGNOSIS — G2581 Restless legs syndrome: Secondary | ICD-10-CM

## 2018-04-24 DIAGNOSIS — G4733 Obstructive sleep apnea (adult) (pediatric): Secondary | ICD-10-CM | POA: Diagnosis not present

## 2018-04-24 DIAGNOSIS — R413 Other amnesia: Secondary | ICD-10-CM

## 2018-04-24 MED ORDER — ROPINIROLE HCL 0.5 MG PO TABS: 0.5000 mg | ORAL_TABLET | Freq: Every day | ORAL | 3 refills | Status: DC

## 2018-04-24 NOTE — Progress Notes (Signed)
PATIENT: Samantha [name] DOB: [birth year]  REASON FOR VISIT: follow up HISTORY FROM: patient  HISTORY OF PRESENT ILLNESS: Today 04/24/18: Samantha [name] is a 77 year old female with a history of memory disturbance.  She returns today for follow-up.  She is currently on Namenda and tolerating it well.  She lives at home with her daughter.  She states that her daughter works as a Pharmacist, hospital and is only there typically at night.  She is able to complete all ADLs independently.  Reports that she operates a motor vehicle with no issues.  Denies any changes with her finances.  She continues to manage her own medications and appointments.  The patient states several times that she is at home alone most of the time because her daughter is working.  She has a past history of depression.  She also reports that she is using her CPAP machine nightly however the download reflects that she has not used it since November 2018.  She continues to use Requip for restless legs with good benefit.  She returns today for evaluation.  HISTORY 10/15/17 , I have a pleasure of seeing Samantha [name], meanwhile 77 year old Caucasian female patient whom I have followed for restless legs and sleep apnea.  She also has reported concerns about decreasing cognitive function and feels that the last 12 months has been up.  During which her memory has decreased.  She is here today with her son who confirms these observations.  I would like to briefly "the CPAP compliance which has been 83% average of 8 hours 28 minutes at night, CPAP is set at 9 cmH2O was to submit a EPR and residual AHI is still 6.4 almost equal amounts of obstructive and central apnea noticed not many leaks are seen.  I think she may benefit from a slight increase in CPAP pressure.  She also endorsed the geriatric depression score at a high rate 9 out of 15 points.  She endorsed the Epworth Sleepiness Scale at 6 points and the fatigue severity score at 47 points. Back to  her chief concern of cognitive decline Mrs. Hsia states that she has been sometimes unaware of the correct date, she was surprised to learn that tomorrow is 1 May.  She reports that she misplaces items more than usual, that she sometimes gets quite frustrated and agitated and she cannot find items.  The patient still handles all her financial affairs herself, she does not notice any difficulties with balancing her checkbook for example.  She has self limited her driving and only drives in her residential area, for this doctor's appointment for example her son has brought her for other doctor's appointments but even to go to church she is usually accompanied by her daughter.    REVIEW OF SYSTEMS: Out of a complete 14 system review of symptoms, the patient complains only of the following symptoms, and all other reviewed systems are negative.  See HPI  ALLERGIES: Allergies  Allergen Reactions  . Codeine     Upset stomach    . Iohexol Hives     Desc: Pt. states she broke out in hives 30 yrs ago w/ a CT Brain scan.  She has since had a heart cath and this CT today (08/10/05) w/o premeds and did well. No evident reaction.  . Shellfish Allergy Itching    Past history of itching with shrimp, has a history of allergy shots , no problem with seafood in years per patient (??)  HOME MEDICATIONS: Outpatient Medications Prior to Visit  Medication Sig Dispense Refill  . carvedilol (COREG) 12.5 MG tablet Take 1 tablet (12.5 mg total) by mouth 2 (two) times daily. 180 tablet 3  . Cholecalciferol (VITAMIN D3) 2000 units TABS Take 2,000 Units by mouth daily.    . citalopram (CELEXA) 20 MG tablet TAKE ONE TABLET DAILY BY MOUTH. 90 tablet 2  . fluticasone (FLONASE) 50 MCG/ACT nasal spray USE 2 SPRAYS EACH NOSTRIL DAILY FOR ALLERGIES. USE DAILY  6  . loratadine (CLARITIN) 10 MG tablet Take 10 mg by mouth daily.    Marland Kitchen losartan (COZAAR) 25 MG tablet Take 25 mg by mouth daily.    . memantine (NAMENDA) 10 MG  tablet Take 10 mg by mouth 2 (two) times daily.  1  . rOPINIRole (REQUIP) 0.5 MG tablet TAKE 1 TABLET (0.5 MG TOTAL) BY MOUTH AT BEDTIME. 90 tablet 3  . furosemide (LASIX) 20 MG tablet TAKE 1 TABLET (20 MG TOTAL) BY MOUTH DAILY. 90 tablet 3   No facility-administered medications prior to visit.     PAST MEDICAL HISTORY: Past Medical History:  Diagnosis Date  . Anemia   . 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 Cancer of colon ([place]) 08/2005   STAGE 1 CHEMO AND RADIATION  . Chronic systolic CHF (congestive heart failure) ([place])   . Colitis   . Dementia ([place])   . Febrile neutropenia ([place])   . Hypertension   . Hypokalemia   . LBBB (left bundle branch block)   . Movement disorder   . Mucositis   . Nonischemic cardiomyopathy ([place])    a. prior varying EF, 30% in 2017. s/p BSX CRTD 05/2016.  Marland Kitchen OSA (obstructive sleep apnea)   . Renal artery aneurysm ([place])   . S/P cardiac cath 2000   Normal coronaries  . Sleep apnea    uses CPAP nightly    PAST SURGICAL HISTORY: Past Surgical History:  Procedure Laterality Date  . APPENDECTOMY    . CARDIAC CATHETERIZATION    . CARPAL TUNNEL RELEASE Left 09/01/2015   Procedure: LEFT CARPAL TUNNEL RELEASE;  Surgeon: Leanora Cover, MD;  Location: [facility];  Service: Orthopedics;  Laterality: Left;  . cataract surgery removal     bilateral  . CESAREAN SECTION    . CHOLECYSTECTOMY    . EP IMPLANTABLE DEVICE N/A 06/15/2016   Procedure: BiV ICD Insertion CRT-D;  Surgeon: Evans Lance, MD;  Location: [facility];  Service: Cardiovascular;  Laterality: N/A;  . HEMORROIDECTOMY    . OPEN REDUCTION INTERNAL FIXATION (ORIF) DISTAL RADIAL FRACTURE Left 04/28/2015   Procedure: OPEN REDUCTION INTERNAL FIXATION (ORIF) LEFT DISTAL RADIUS FRACTURE;  Surgeon: Leanora Cover, MD;  Location: [facility];  Service: Orthopedics;  Laterality: Left;  . PORT-A-CATH REMOVAL    . TONSILLECTOMY    .  TUBAL LIGATION    . US ECHOCARDIOGRAPHY  3/12   EF 30 to 35%    FAMILY HISTORY: Family History  Problem Relation Age of Onset  . Diabetes Sister   . Cancer Brother        PROSTATE    SOCIAL HISTORY: Social History   Socioeconomic History  . Marital status: Divorced    Spouse name: Not on file  . Number of children: 2  . Years of education: 74  . Highest education level: Not on file  Occupational History    Employer: RETIRED  . Occupation: Retired  Scientific laboratory technician  .  Financial resource strain: Not on file  . Food insecurity:    Worry: Not on file    Inability: Not on file  . Transportation needs:    Medical: Not on file    Non-medical: Not on file  Tobacco Use  . Smoking status: Never Smoker  . Smokeless tobacco: Never Used  Substance and Sexual Activity  . Alcohol use: No    Alcohol/week: 0.0 standard drinks  . Drug use: No  . Sexual activity: Never    Comment: 1st intercourse 77 yo-More than 5 partners  Lifestyle  . Physical activity:    Days per week: Not on file    Minutes per session: Not on file  . Stress: Not on file  Relationships  . Social connections:    Talks on phone: Not on file    Gets together: Not on file    Attends religious service: Not on file    Active member of club or organization: Not on file    Attends meetings of clubs or organizations: Not on file    Relationship status: Not on file  . Intimate partner violence:    Fear of current or ex partner: Not on file    Emotionally abused: Not on file    Physically abused: Not on file    Forced sexual activity: Not on file  Other Topics Concern  . Not on file  Social History Narrative   Patient lives at home alone.    Patient has 2 children.    Patient is retired.    Patient has a high school education.    Patient is divorced.    Patient is right handed.       PHYSICAL EXAM  Vitals:   04/24/18 1408  BP: (!) 101/58  Pulse: 90  Weight: 187 lb 3.2 oz (84.9 kg)  Height: 5\' 5"   (1.651 m)   Body mass index is 31.15 kg/m.   MMSE - Mini Mental State Exam 04/24/2018 10/15/2017 02/25/2017  Not completed: (No Data) - -  Orientation to time 4 4 5   Orientation to Place 4 5 5   Registration 3 3 3   Attention/ Calculation 1 3 1   Attention/Calculation-comments - refused to do numbers -  Recall 1 0 1  Language- name 2 objects 2 2 2   Language- repeat 1 1 1   Language- follow 3 step command 2 3 3   Language- read & follow direction 1 0 1  Write a sentence 1 0 1  Copy design 1 0 1  Total score 21 21 24      Generalized: Well developed, in no acute distress   Neurological examination  Mentation: Alert oriented to time, place, history taking. Follows all commands speech and language fluent Cranial nerve II-XII: Pupils were equal round reactive to light. Extraocular movements were full, visual field were full on confrontational test. Facial sensation and strength were normal. Uvula tongue midline. Head turning and shoulder shrug  were normal and symmetric. Motor: The motor testing reveals 5 over 5 strength of all 4 extremities. Good symmetric motor tone is noted throughout.  Sensory: Sensory testing is intact to soft touch on all 4 extremities. No evidence of extinction is noted.  Coordination: Cerebellar testing reveals good finger-nose-finger and heel-to-shin bilaterally.  Gait and station: Gait is normal.  Reflexes: Deep tendon reflexes are symmetric and normal bilaterally.   DIAGNOSTIC DATA (LABS, IMAGING, TESTING) - I reviewed patient records, labs, notes, testing and imaging myself where available.  Lab Results  Component Value  Date   WBC 7.0 09/06/2017   HGB 12.6 09/06/2017   HCT 37.0 09/06/2017   MCV 100.5 (H) 09/06/2017   PLT 192 09/06/2017      Component Value Date/Time   NA 140 09/06/2017 1848   NA 141 10/03/2016 1355   NA 141 10/01/2011 1302   K 4.3 09/06/2017 1848   K 4.3 10/01/2011 1302   CL 103 09/06/2017 1848   CL 100 10/01/2011 1302   CO2 25  09/06/2017 1830   CO2 30 10/01/2011 1302   GLUCOSE 127 (H) 09/06/2017 1848   GLUCOSE 93 10/01/2011 1302   BUN 30 (H) 09/06/2017 1848   BUN 17 10/03/2016 1355   BUN 18 10/01/2011 1302   CREATININE 1.30 (H) 09/06/2017 1848   CREATININE 1.17 (H) 06/05/2016 1549   CALCIUM 9.2 09/06/2017 1830   CALCIUM 8.8 10/01/2011 1302   PROT 6.8 11/25/2014 1427   PROT 7.2 10/01/2011 1302   ALBUMIN 4.5 11/25/2014 1427   AST 15 11/25/2014 1427   AST 22 10/01/2011 1302   ALT 17 11/25/2014 1427   ALT 23 10/01/2011 1302   ALKPHOS 71 11/25/2014 1427   ALKPHOS 69 10/01/2011 1302   BILITOT 0.3 11/25/2014 1427   BILITOT 0.40 10/01/2011 1302   GFRNONAA 39 (L) 09/06/2017 1830   GFRAA 45 (L) 09/06/2017 1830   Lab Results  Component Value Date   CHOL 185 03/10/2013   HDL 57.30 03/10/2013   LDLCALC 110 (H) 03/10/2013   TRIG 91.0 03/10/2013   CHOLHDL 3 03/10/2013   No results found for: HGBA1C Lab Results  Component Value Date   VITAMINB12 334 10/15/2017   Lab Results  Component Value Date   TSH 1.580 10/15/2017      ASSESSMENT AND PLAN 77 y.o. year old female  has a past medical history of Anemia, Biventricular ICD (implantable cardioverter-defibrillator) in place (05/2016), Cancer of colon ([place]) (58/5929), Chronic systolic CHF (congestive heart failure) ([place]), Colitis, Dementia ([place]), Febrile neutropenia ([place]), Hypertension, Hypokalemia, LBBB (left bundle branch block), Movement disorder, Mucositis, Nonischemic cardiomyopathy ([place]), OSA (obstructive sleep apnea), Renal artery aneurysm ([place]), S/P cardiac cath (2000), and Sleep apnea. here with:  1.  Obstructive sleep apnea on CPAP 2.  Memory disturbance  The patient memory score has remained stable.  She will continue on Namenda 10 mg twice a day.  She will continue on Requip for restless legs.  We will call her DME company to inquire about her download.  The patient is insistent that she uses the machine nightly.  She is advised that if her  symptoms worsen or she develops new symptoms she should let us know.  She will follow-up in 6 months or sooner if needed.   Ward Givens, MSN, NP-C 04/24/2018, 2:24 PM [facility] [address], [place], [place] [address] [phone]

## 2018-04-24 NOTE — Progress Notes (Signed)
I have read the note, and I agree with the clinical assessment and plan.  Charles K Willis   

## 2018-04-24 NOTE — Patient Instructions (Addendum)
Your Plan:  Continue to monitor memory Score is stable- continue Namenda Continue Requip   Thank you for coming to see [place] at [facility]. I hope we have been able to provide you high quality care today.  You may receive a patient satisfaction survey over the next few weeks. We would appreciate your feedback and comments so that we may continue to improve ourselves and the health of our patients.

## 2018-04-24 NOTE — Addendum Note (Signed)
Addended by: Trudie Buckler on: 04/24/2018 04:16 PM   Modules accepted: Level of Service

## 2018-05-12 DIAGNOSIS — F321 Major depressive disorder, single episode, moderate: Secondary | ICD-10-CM | POA: Diagnosis not present

## 2018-05-12 DIAGNOSIS — F419 Anxiety disorder, unspecified: Secondary | ICD-10-CM | POA: Diagnosis not present

## 2018-05-14 ENCOUNTER — Telehealth: Payer: Self-pay | Admitting: Adult Health

## 2018-05-14 NOTE — Telephone Encounter (Addendum)
Returned call to Samantha [name] (daughter on [place]) to review the patient's last office visit.  Rhonda informed me that she will be moving out of her mother's house soon and the family is starting to visit assisted living facilities for the patient to move to so she will not be alone.

## 2018-05-14 NOTE — Telephone Encounter (Signed)
Pts daughter [name] onDPR) requesting a call to discuss the pts last office visit. Just to touch base about what had been discussed. Please advise.

## 2018-05-20 ENCOUNTER — Ambulatory Visit (INDEPENDENT_AMBULATORY_CARE_PROVIDER_SITE_OTHER): Payer: Medicare Other

## 2018-05-20 DIAGNOSIS — I5022 Chronic systolic (congestive) heart failure: Secondary | ICD-10-CM | POA: Diagnosis not present

## 2018-05-20 NOTE — Progress Notes (Signed)
Remote ICD transmission.   

## 2018-05-27 ENCOUNTER — Encounter: Payer: Self-pay | Admitting: Cardiology

## 2018-07-10 LAB — CUP PACEART REMOTE DEVICE CHECK
Date Time Interrogation Session: 20200123173147
Implantable Lead Implant Date: 20171229
Implantable Lead Implant Date: 20171229
Implantable Lead Implant Date: 20171229
Implantable Lead Location: 753858
Implantable Lead Location: 753859
Implantable Lead Location: 753860
Implantable Lead Model: 292
Implantable Lead Model: 4677
Implantable Lead Model: 7740
Implantable Lead Serial Number: 423617
Implantable Lead Serial Number: 604122
Implantable Lead Serial Number: 686437
Implantable Pulse Generator Implant Date: 20171229
Pulse Gen Serial Number: 169900

## 2018-08-19 ENCOUNTER — Ambulatory Visit (INDEPENDENT_AMBULATORY_CARE_PROVIDER_SITE_OTHER): Payer: PRIVATE HEALTH INSURANCE | Admitting: *Deleted

## 2018-08-19 DIAGNOSIS — I5022 Chronic systolic (congestive) heart failure: Secondary | ICD-10-CM | POA: Diagnosis not present

## 2018-08-22 LAB — CUP PACEART REMOTE DEVICE CHECK
Battery Remaining Longevity: 132 mo
Battery Remaining Percentage: 100 %
Brady Statistic RA Percent Paced: 0 %
Brady Statistic RV Percent Paced: 0 %
Date Time Interrogation Session: 20200303081100
HighPow Impedance: 79 Ohm
Implantable Lead Implant Date: 20171229
Implantable Lead Implant Date: 20171229
Implantable Lead Implant Date: 20171229
Implantable Lead Location: 753858
Implantable Lead Location: 753859
Implantable Lead Location: 753860
Implantable Lead Model: 292
Implantable Lead Model: 4677
Implantable Lead Model: 7740
Implantable Lead Serial Number: 423617
Implantable Lead Serial Number: 604122
Implantable Lead Serial Number: 686437
Implantable Pulse Generator Implant Date: 20171229
Lead Channel Impedance Value: 518 Ohm
Lead Channel Impedance Value: 628 Ohm
Lead Channel Impedance Value: 919 Ohm
Lead Channel Setting Pacing Amplitude: 2 V
Lead Channel Setting Pacing Amplitude: 2.5 V
Lead Channel Setting Pacing Pulse Width: 0.4 ms
Lead Channel Setting Sensing Sensitivity: 0.5 mV
Lead Channel Setting Sensing Sensitivity: 1 mV
Pulse Gen Serial Number: 169900

## 2018-08-27 ENCOUNTER — Encounter: Payer: Self-pay | Admitting: Cardiology

## 2018-08-27 NOTE — Progress Notes (Signed)
Remote ICD transmission.   

## 2018-08-28 DIAGNOSIS — L814 Other melanin hyperpigmentation: Secondary | ICD-10-CM | POA: Diagnosis not present

## 2018-08-28 DIAGNOSIS — D1801 Hemangioma of skin and subcutaneous tissue: Secondary | ICD-10-CM | POA: Diagnosis not present

## 2018-08-28 DIAGNOSIS — Z872 Personal history of diseases of the skin and subcutaneous tissue: Secondary | ICD-10-CM | POA: Diagnosis not present

## 2018-08-28 DIAGNOSIS — D225 Melanocytic nevi of trunk: Secondary | ICD-10-CM | POA: Diagnosis not present

## 2018-08-28 DIAGNOSIS — L821 Other seborrheic keratosis: Secondary | ICD-10-CM | POA: Diagnosis not present

## 2018-09-22 ENCOUNTER — Other Ambulatory Visit: Payer: Self-pay | Admitting: Nurse Practitioner

## 2018-09-22 ENCOUNTER — Other Ambulatory Visit: Payer: Self-pay | Admitting: Neurology

## 2018-09-22 DIAGNOSIS — I5023 Acute on chronic systolic (congestive) heart failure: Secondary | ICD-10-CM

## 2018-10-17 ENCOUNTER — Other Ambulatory Visit: Payer: Self-pay | Admitting: Nurse Practitioner

## 2018-10-17 DIAGNOSIS — I5023 Acute on chronic systolic (congestive) heart failure: Secondary | ICD-10-CM

## 2018-10-21 ENCOUNTER — Other Ambulatory Visit: Payer: Self-pay | Admitting: Gynecology

## 2018-10-21 NOTE — Telephone Encounter (Signed)
Patient has not been seen since 2018.  78 yo. Please advise regarding refill.  You have prescribed it for her off and on since 2013.

## 2018-10-22 NOTE — Telephone Encounter (Signed)
I sent the refill 1 for #30 instead of 60.  She will need to have an office visit for me to continually refill this.  She can come in later this summer.  Alternative would be to have her primary take over and refill this for her if she no longer wants to come to see the gynecologist.

## 2018-10-22 NOTE — Telephone Encounter (Signed)
I sent Butch Penny a staff message to call patient to scheduled and sent her Dr. Dorette Grate note with the options.

## 2018-10-29 ENCOUNTER — Other Ambulatory Visit: Payer: Self-pay | Admitting: Gynecology

## 2018-11-18 ENCOUNTER — Ambulatory Visit (INDEPENDENT_AMBULATORY_CARE_PROVIDER_SITE_OTHER): Payer: Medicare Other | Admitting: *Deleted

## 2018-11-18 DIAGNOSIS — I5022 Chronic systolic (congestive) heart failure: Secondary | ICD-10-CM | POA: Diagnosis not present

## 2018-11-18 DIAGNOSIS — Z95810 Presence of automatic (implantable) cardiac defibrillator: Secondary | ICD-10-CM

## 2018-11-19 LAB — CUP PACEART REMOTE DEVICE CHECK
Date Time Interrogation Session: 20200603103753
Implantable Lead Implant Date: 20171229
Implantable Lead Implant Date: 20171229
Implantable Lead Implant Date: 20171229
Implantable Lead Location: 753858
Implantable Lead Location: 753859
Implantable Lead Location: 753860
Implantable Lead Model: 292
Implantable Lead Model: 4677
Implantable Lead Model: 7740
Implantable Lead Serial Number: 423617
Implantable Lead Serial Number: 604122
Implantable Lead Serial Number: 686437
Implantable Pulse Generator Implant Date: 20171229
Pulse Gen Serial Number: 169900

## 2018-11-26 ENCOUNTER — Encounter: Payer: Self-pay | Admitting: Cardiology

## 2018-11-26 NOTE — Progress Notes (Signed)
Remote ICD transmission.   

## 2018-12-15 ENCOUNTER — Other Ambulatory Visit: Payer: Self-pay | Admitting: Nurse Practitioner

## 2018-12-15 DIAGNOSIS — I5023 Acute on chronic systolic (congestive) heart failure: Secondary | ICD-10-CM

## 2018-12-18 ENCOUNTER — Other Ambulatory Visit: Payer: Self-pay

## 2018-12-22 ENCOUNTER — Ambulatory Visit (INDEPENDENT_AMBULATORY_CARE_PROVIDER_SITE_OTHER): Payer: Medicare Other | Admitting: Gynecology

## 2018-12-22 ENCOUNTER — Encounter: Payer: Self-pay | Admitting: Gynecology

## 2018-12-22 ENCOUNTER — Other Ambulatory Visit: Payer: Self-pay

## 2018-12-22 VITALS — BP 120/64 | Ht 65.0 in | Wt 178.0 lb

## 2018-12-22 DIAGNOSIS — R32 Unspecified urinary incontinence: Secondary | ICD-10-CM

## 2018-12-22 DIAGNOSIS — Z9189 Other specified personal risk factors, not elsewhere classified: Secondary | ICD-10-CM

## 2018-12-22 DIAGNOSIS — N952 Postmenopausal atrophic vaginitis: Secondary | ICD-10-CM | POA: Diagnosis not present

## 2018-12-22 DIAGNOSIS — Z01419 Encounter for gynecological examination (general) (routine) without abnormal findings: Secondary | ICD-10-CM | POA: Diagnosis not present

## 2018-12-22 NOTE — Patient Instructions (Signed)
Schedule your screening mammogram this summer or fall.

## 2018-12-22 NOTE — Addendum Note (Signed)
Addended by: Nelva Nay on: 12/22/2018 04:32 PM   Modules accepted: Orders

## 2018-12-22 NOTE — Progress Notes (Signed)
    Samantha [name] 1941/01/11 [id number]        78 y.o.  [id number] for breast and pelvic exam.  Had an episode of urinary incontinence on the way to the office.  Having some issues with loss of urine over the past year although not consistent.  Past medical history,surgical history, problem list, medications, allergies, family history and social history were all reviewed and documented as reviewed in the EPIC chart.  ROS:  Performed with pertinent positives and negatives included in the history, assessment and plan.   Additional significant findings : None   Exam: Caryn Bee assistant Vitals:   12/22/18 1432  BP: 120/64  Weight: 178 lb (80.7 kg)  Height: 5\' 5"  (1.651 m)   Body mass index is 29.62 kg/m.  General appearance:  Normal affect, orientation and appearance. Skin: Grossly normal HEENT: Without gross lesions.  No cervical or supraclavicular adenopathy. Thyroid normal.  Lungs:  Clear without wheezing, rales or rhonchi Cardiac: RR, without RMG Abdominal:  Soft, nontender, without masses, guarding, rebound, organomegaly or hernia Breasts:  Examined lying and sitting without masses, retractions, discharge or axillary adenopathy. Pelvic:  Ext, BUS, Vagina: With atrophic changes  Cervix: With atrophic changes   Uterus: Anteverted, normal size, shape and contour, midline and mobile nontender   Adnexa: Without masses or tenderness    Anus and perineum: Normal   Rectovaginal: Normal sphincter tone without palpated masses or tenderness.    Assessment/Plan:  78 y.o. [id number] female for breast and pelvic exam  1. Postmenopausal/atrophic genital changes.  No significant menopausal symptoms or any vaginal bleeding. 2. Intermittent urinary incontinence.  No pain or urgency associated with it.  Will check urine analysis today.  Discussed referral to urology if continues to be an issue. 3. Mammography 01/2016.  Reviewed that she is overdue for her screening mammogram and recommended she  schedule this now and she agrees to do so.  Breast exam normal today.  Discussed my recommendations with her granddaughter who accompanied her today. 4. Pap smear 2015.  No Pap smear done today.  No history of significant abnormal Pap smears.  Per current screening guidelines we both agree to stop screening. 5. Bone health.  Followed by Dr. Osborne Casco. 6. Colonoscopy 2017.  Repeat at their recommended interval. 7. Health maintenance.  No routine lab work ordered.  Follow-up in 1 year, sooner as needed.   Anastasio Auerbach MD, 2:57 PM 12/22/2018

## 2018-12-23 DIAGNOSIS — Z961 Presence of intraocular lens: Secondary | ICD-10-CM | POA: Diagnosis not present

## 2018-12-23 DIAGNOSIS — H5211 Myopia, right eye: Secondary | ICD-10-CM | POA: Diagnosis not present

## 2018-12-23 DIAGNOSIS — H35372 Puckering of macula, left eye: Secondary | ICD-10-CM | POA: Diagnosis not present

## 2018-12-24 LAB — URINALYSIS, COMPLETE W/RFL CULTURE
Bacteria, UA: NONE SEEN /HPF
Bilirubin Urine: NEGATIVE
Glucose, UA: NEGATIVE
Hyaline Cast: NONE SEEN /LPF
Ketones, ur: NEGATIVE
Leukocyte Esterase: NEGATIVE
Nitrites, Initial: NEGATIVE
Protein, ur: NEGATIVE
Specific Gravity, Urine: 1.01 (ref 1.001–1.03)
pH: 6 (ref 5.0–8.0)

## 2018-12-24 LAB — URINE CULTURE
MICRO NUMBER:: 639414
Result:: NO GROWTH
SPECIMEN QUALITY:: ADEQUATE

## 2018-12-24 LAB — CULTURE INDICATED

## 2018-12-25 ENCOUNTER — Other Ambulatory Visit: Payer: Self-pay | Admitting: *Deleted

## 2018-12-25 ENCOUNTER — Other Ambulatory Visit: Payer: Self-pay

## 2018-12-25 ENCOUNTER — Encounter: Payer: Self-pay | Admitting: Adult Health

## 2018-12-25 ENCOUNTER — Ambulatory Visit (INDEPENDENT_AMBULATORY_CARE_PROVIDER_SITE_OTHER): Payer: Medicare Other | Admitting: Adult Health

## 2018-12-25 VITALS — BP 116/55 | HR 76 | Temp 98.4°F | Ht 65.0 in | Wt 179.6 lb

## 2018-12-25 DIAGNOSIS — R413 Other amnesia: Secondary | ICD-10-CM

## 2018-12-25 DIAGNOSIS — G4733 Obstructive sleep apnea (adult) (pediatric): Secondary | ICD-10-CM | POA: Diagnosis not present

## 2018-12-25 DIAGNOSIS — Z9989 Dependence on other enabling machines and devices: Secondary | ICD-10-CM | POA: Diagnosis not present

## 2018-12-25 DIAGNOSIS — G2581 Restless legs syndrome: Secondary | ICD-10-CM

## 2018-12-25 NOTE — Progress Notes (Signed)
PATIENT: Samantha [name] DOB: 01-31-1941  REASON FOR VISIT: follow up HISTORY FROM: patient  HISTORY OF PRESENT ILLNESS: Today 12/25/18:  Samantha [name] is a 78 year old female with a history of memory disturbance, restless leg and obstructive sleep apnea on CPAP.  She feels that her memory is slightly worse.  She now lives at home alone.  She has a granddaughter that lives down the street that checks on her frequently.  She reports that she is able to complete all ADLs independently.  She does cook but often goes out to get food.  She operates a Teacher, music without difficulty.  States that she only drives short distances.  She continues to be upset with her children for the amount of time they spend with her and call.  She states that Requip continues to work well for her restless legs.  She continues to use the CPAP machine.  She brought her machine with her but does not have a card.  We are not able to obtain a wireless download.  HISTORY 04/24/18: Samantha [name] is a 78 year old female with a history of memory disturbance.  She returns today for follow-up.  She is currently on Namenda and tolerating it well.  She lives at home with her daughter.  She states that her daughter works as a Pharmacist, hospital and is only there typically at night.  She is able to complete all ADLs independently.  Reports that she operates a motor vehicle with no issues.  Denies any changes with her finances.  She continues to manage her own medications and appointments.  The patient states several times that she is at home alone most of the time because her daughter is working.  She has a past history of depression.  She also reports that she is using her CPAP machine nightly however the download reflects that she has not used it since November 2018.  She continues to use Requip for restless legs with good benefit.  She returns today for evaluation.  REVIEW OF SYSTEMS: Out of a complete 14 system review of symptoms, the patient  complains only of the following symptoms, and all other reviewed systems are negative.  See HPI  ALLERGIES: Allergies  Allergen Reactions  . Codeine     Upset stomach    . Iohexol Hives     Desc: Pt. states she broke out in hives 30 yrs ago w/ a CT Brain scan.  She has since had a heart cath and this CT today (08/10/05) w/o premeds and did well. No evident reaction.  . Shellfish Allergy Itching    Past history of itching with shrimp, has a history of allergy shots , no problem with seafood in years per patient (??)    HOME MEDICATIONS: Outpatient Medications Prior to Visit  Medication Sig Dispense Refill  . carvedilol (COREG) 12.5 MG tablet Take 1 tablet (12.5 mg total) by mouth 2 (two) times daily. Please make annual appt for future refills. Thank you. 1st attempt. 60 tablet 0  . Cholecalciferol (VITAMIN D3) 2000 units TABS Take 2,000 Units by mouth daily.    . citalopram (CELEXA) 20 MG tablet TAKE 1 TABLET BY MOUTH EVERY DAY 90 tablet 2  . fluticasone (FLONASE) 50 MCG/ACT nasal spray USE 2 SPRAYS EACH NOSTRIL DAILY FOR ALLERGIES. USE DAILY  6  . furosemide (LASIX) 20 MG tablet TAKE 1 TABLET (20 MG TOTAL) BY MOUTH DAILY. 90 tablet 1  . loratadine (CLARITIN) 10 MG tablet Take 10 mg by mouth  daily.    . losartan (COZAAR) 25 MG tablet Take 25 mg by mouth daily.    . memantine (NAMENDA) 10 MG tablet Take 10 mg by mouth 2 (two) times daily.  1  . rOPINIRole (REQUIP) 0.5 MG tablet Take 1 tablet (0.5 mg total) by mouth at bedtime. 90 tablet 3  . valACYclovir (VALTREX) 500 MG tablet TAKE 1 TABLET BY MOUTH TWICE A DAY FOR 5 DAYS 30 tablet 0   No facility-administered medications prior to visit.     PAST MEDICAL HISTORY: Past Medical History:  Diagnosis Date  . Anemia   . 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 Cancer of colon ([place]) 08/2005   STAGE 1 CHEMO AND RADIATION  . Chronic systolic CHF (congestive heart failure) ([place])   . Colitis   .  Dementia ([place])   . Febrile neutropenia ([place])   . Hypertension   . Hypokalemia   . LBBB (left bundle branch block)   . Movement disorder   . Mucositis   . Nonischemic cardiomyopathy ([place])    a. prior varying EF, 30% in 2017. s/p BSX CRTD 05/2016.  Marland Kitchen OSA (obstructive sleep apnea)   . 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 S/P cardiac cath 2000   Normal coronaries  . Sleep apnea    uses CPAP nightly    PAST SURGICAL HISTORY: Past Surgical History:  Procedure Laterality Date  . APPENDECTOMY    . CARDIAC CATHETERIZATION    . CARPAL TUNNEL RELEASE Left 09/01/2015   Procedure: LEFT CARPAL TUNNEL RELEASE;  Surgeon: Leanora Cover, MD;  Location: [facility];  Service: Orthopedics;  Laterality: Left;  . cataract surgery removal     bilateral  . CESAREAN SECTION    . CHOLECYSTECTOMY    . EP IMPLANTABLE DEVICE N/A 06/15/2016   Procedure: BiV ICD Insertion CRT-D;  Surgeon: Evans Lance, MD;  Location: [facility];  Service: Cardiovascular;  Laterality: N/A;  . HEMORROIDECTOMY    . OPEN REDUCTION INTERNAL FIXATION (ORIF) DISTAL RADIAL FRACTURE Left 04/28/2015   Procedure: OPEN REDUCTION INTERNAL FIXATION (ORIF) LEFT DISTAL RADIUS FRACTURE;  Surgeon: Leanora Cover, MD;  Location: [facility];  Service: Orthopedics;  Laterality: Left;  . PORT-A-CATH REMOVAL    . TONSILLECTOMY    . TUBAL LIGATION    . US ECHOCARDIOGRAPHY  3/12   EF 30 to 35%    FAMILY HISTORY: Family History  Problem Relation Age of Onset  . Diabetes Sister   . Cancer Brother        PROSTATE    SOCIAL HISTORY: Social History   Socioeconomic History  . Marital status: Divorced    Spouse name: Not on file  . Number of children: 2  . Years of education: 40  . Highest education level: Not on file  Occupational History    Employer: RETIRED  . Occupation: Retired  Scientific laboratory technician  . Financial resource strain: Not on file  . Food insecurity    Worry: Not on file    Inability: Not on file   . Transportation needs    Medical: Not on file    Non-medical: Not on file  Tobacco Use  . Smoking status: Never Smoker  . Smokeless tobacco: Never Used  Substance and Sexual Activity  . Alcohol use: No    Alcohol/week: 0.0 standard drinks  . Drug use: No  . Sexual activity: Never    Comment: 1st intercourse 78 yo-More than 5 partners  Lifestyle  . Physical activity    Days per week: Not on file    Minutes per session: Not on file  . Stress: Not on file  Relationships  . Social Herbalist on phone: Not on file    Gets together: Not on file    Attends religious service: Not on file    Active member of club or organization: Not on file    Attends meetings of clubs or organizations: Not on file    Relationship status: Not on file  . Intimate partner violence    Fear of current or ex partner: Not on file    Emotionally abused: Not on file    Physically abused: Not on file    Forced sexual activity: Not on file  Other Topics Concern  . Not on file  Social History Narrative   Patient lives at home alone.    Patient has 2 children.    Patient is retired.    Patient has a high school education.    Patient is divorced.    Patient is right handed.       PHYSICAL EXAM  Vitals:   12/25/18 1431  BP: (!) 116/55  Pulse: 76  Temp: 98.4 F (36.9 C)  Weight: 179 lb 9.6 oz (81.5 kg)  Height: 5\' 5"  (1.651 m)   Body mass index is 29.89 kg/m.   MMSE - Mini Mental State Exam 12/25/2018 04/24/2018 10/15/2017  Not completed: - (No Data) -  Orientation to time 3 4 4   Orientation to Place 4 4 5   Registration 3 3 3   Attention/ Calculation 5 1 3   Attention/Calculation-comments - - refused to do numbers  Recall 1 1 0  Language- name 2 objects 2 2 2   Language- repeat 1 1 1   Language- follow 3 step command 3 2 3   Language- read & follow direction 1 1 0  Write a sentence 1 1 0  Copy design 1 1 0  Copy design-comments 10 animals - -  Total score 25 21 21       Generalized: Well developed, in no acute distress   Neurological examination  Mentation: Alert oriented to time, place, history taking. Follows all commands speech and language fluent Cranial nerve II-XII: Pupils were equal round reactive to light. Extraocular movements were full, visual field were full on confrontational test. Facial sensation and strength were normal. Uvula tongue midline. Head turning and shoulder shrug  were normal and symmetric. Motor: The motor testing reveals 5 over 5 strength of all 4 extremities. Good symmetric motor tone is noted throughout.  Sensory: Sensory testing is intact to soft touch on all 4 extremities. No evidence of extinction is noted.  Coordination: Cerebellar testing reveals good finger-nose-finger and heel-to-shin bilaterally.  Gait and station: Gait is normal. Tandem gait is unsteady.  DIAGNOSTIC DATA (LABS, IMAGING, TESTING) - I reviewed patient records, labs, notes, testing and imaging myself where available.  Lab Results  Component Value Date   WBC 7.0 09/06/2017   HGB 12.6 09/06/2017   HCT 37.0 09/06/2017   MCV 100.5 (H) 09/06/2017   PLT 192 09/06/2017      Component Value Date/Time   NA 140 09/06/2017 1848   NA 141 10/03/2016 1355   NA 141 10/01/2011 1302   K 4.3 09/06/2017 1848   K 4.3 10/01/2011 1302   CL 103 09/06/2017 1848   CL 100 10/01/2011 1302   CO2 25 09/06/2017 1830   CO2 30 10/01/2011 1302  GLUCOSE 127 (H) 09/06/2017 1848   GLUCOSE 93 10/01/2011 1302   BUN 30 (H) 09/06/2017 1848   BUN 17 10/03/2016 1355   BUN 18 10/01/2011 1302   CREATININE 1.30 (H) 09/06/2017 1848   CREATININE 1.17 (H) 06/05/2016 1549   CALCIUM 9.2 09/06/2017 1830   CALCIUM 8.8 10/01/2011 1302   PROT 6.8 11/25/2014 1427   PROT 7.2 10/01/2011 1302   ALBUMIN 4.5 11/25/2014 1427   AST 15 11/25/2014 1427   AST 22 10/01/2011 1302   ALT 17 11/25/2014 1427   ALT 23 10/01/2011 1302   ALKPHOS 71 11/25/2014 1427   ALKPHOS 69 10/01/2011 1302    BILITOT 0.3 11/25/2014 1427   BILITOT 0.40 10/01/2011 1302   GFRNONAA 39 (L) 09/06/2017 1830   GFRAA 45 (L) 09/06/2017 1830   Lab Results  Component Value Date   CHOL 185 03/10/2013   HDL 57.30 03/10/2013   LDLCALC 110 (H) 03/10/2013   TRIG 91.0 03/10/2013   CHOLHDL 3 03/10/2013   No results found for: HGBA1C Lab Results  Component Value Date   VITAMINB12 334 10/15/2017   Lab Results  Component Value Date   TSH 1.580 10/15/2017      ASSESSMENT AND PLAN 78 y.o. year old female  has a past medical history of Anemia, Biventricular ICD (implantable cardioverter-defibrillator) in place (05/2016), Cancer of colon ([place]) (45/0388), Chronic systolic CHF (congestive heart failure) ([place]), Colitis, Dementia ([place]), Febrile neutropenia ([place]), Hypertension, Hypokalemia, LBBB (left bundle branch block), Movement disorder, Mucositis, Nonischemic cardiomyopathy ([place]), OSA (obstructive sleep apnea), Renal artery aneurysm ([place]), S/P cardiac cath (2000), and Sleep apnea. here with:  1.  Memory disturbance 2.  Restless leg syndrome 3.  Obstructive sleep apnea on CPAP  The patient's memory score is stable.  She will continue on Namenda.  Requip continues to work well for restless legs.  This will be continued.  We were unable to obtain a download today.  I will request that her DME company provide her with an [place] card.  I have advised the patient that if her symptoms worsen or she develops new symptoms she should let us know.  She will follow-up in 6 months or sooner if needed.      Ward Givens, MSN, NP-C 12/25/2018, 11:52 AM [facility] [address], [place], [place] [address] [phone]

## 2018-12-25 NOTE — Patient Instructions (Addendum)
Your Plan:  Continue Requip Memory score is stable  Continue Namenda Continue using CPAP  If your symptoms worsen or you develop new symptoms please let us know.    Thank you for coming to see [place] at [facility]. I hope we have been able to provide you high quality care today.  You may receive a patient satisfaction survey over the next few weeks. We would appreciate your feedback and comments so that we may continue to improve ourselves and the health of our patients.

## 2018-12-27 ENCOUNTER — Encounter: Payer: Self-pay | Admitting: Adult Health

## 2018-12-29 NOTE — Progress Notes (Signed)
I called pt and LMVM for her relating to MM/NP recommendations increase pressure to 11cm , change out supplies every 3 months and if feel like leaking may get mask refitting.  Pt to call back if questions.

## 2018-12-29 NOTE — Progress Notes (Signed)
I received the patient CPAP download.  She used her machine 27 out of 30 days for compliance of 90%.  She use her machine greater than 4 hours 24 out of 30 days for compliance of 80%.  On average she uses her machine 7 hours and 31 minutes.  Her residual AHI is 9.4 on 10 cm of water with EPR of 1.  Her leak in the 95th percentile is 29.2 L/min.  Since her AHI is elevated I will increase her pressure to 11 cm of water.  Please advised the patient to make sure she is changing out her supplies every 3 months.  If she feels her mask leaking then we need to do a mask refitting.

## 2018-12-30 NOTE — Progress Notes (Signed)
[facility] received cpap order 12-30-18 sy

## 2019-01-07 ENCOUNTER — Encounter: Payer: Self-pay | Admitting: Physician Assistant

## 2019-01-07 ENCOUNTER — Telehealth: Payer: Self-pay

## 2019-01-07 ENCOUNTER — Telehealth (INDEPENDENT_AMBULATORY_CARE_PROVIDER_SITE_OTHER): Payer: Medicare (Managed Care) | Admitting: Physician Assistant

## 2019-01-07 ENCOUNTER — Other Ambulatory Visit: Payer: Self-pay

## 2019-01-07 VITALS — Ht 65.0 in | Wt 175.0 lb

## 2019-01-07 DIAGNOSIS — I5022 Chronic systolic (congestive) heart failure: Secondary | ICD-10-CM

## 2019-01-07 DIAGNOSIS — I447 Left bundle-branch block, unspecified: Secondary | ICD-10-CM

## 2019-01-07 DIAGNOSIS — I10 Essential (primary) hypertension: Secondary | ICD-10-CM

## 2019-01-07 DIAGNOSIS — Z95810 Presence of automatic (implantable) cardiac defibrillator: Secondary | ICD-10-CM

## 2019-01-07 NOTE — Progress Notes (Signed)
Virtual Visit via Telephone Note   This visit type was conducted due to national recommendations for restrictions regarding the COVID-19 Pandemic (e.g. social distancing) in an effort to limit this patient's exposure and mitigate transmission in our community.  Due to her co-morbid illnesses, this patient is at least at moderate risk for complications without adequate follow up.  This format is felt to be most appropriate for this patient at this time.  The patient did not have access to video technology/had technical difficulties with video requiring transitioning to audio format only (telephone).  All issues noted in this document were discussed and addressed.  No physical exam could be performed with this format.  Please refer to the patient's chart for her  consent to telehealth for [facility].   Date:  01/07/2019   ID:  Samantha [name], DOB [birth year], MRN [id number]  Patient Location: Home Provider Location: Home  PCP:  Tisovec, Fransico Him, MD  Cardiologist:  Mertie Moores, MD  EP: Dr. Lovena Le  Evaluation Performed:  Follow-Up Visit  Chief Complaint:  Yearly follow up  History of Present Illness:    Samantha [name] is a 78 y.o. female with hx of chronic systolic CHF s/p ICD, OSA on CPAP, HTN, LBBB, colon cancer s/p chemoradiation 2007 and dementia seen for follow up.  Normal coronaries by cath in 2000. The patient has persistent LV dysfunction despite guideline directed therapy.  EF was 30% by last echo 05/23/16.  Underwent implantation of BSX CRTD on 06/15/16 by Dr Lovena Le.  Last seen by Dr. Acie Fredrickson 01/2018.  Seen virtually for follow-up.  No regular exercise but active doing household stuff.  Denies chest pain, shortness of breath, orthopnea, PND, syncope, lower extremity edema or melena.  Compliant with low-sodium diet.  The patient does not have symptoms concerning for COVID-19 infection (fever, chills, cough, or new shortness of breath).    Past Medical History:  Diagnosis  Date  . Anemia   . 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 Cancer of colon ([place]) 08/2005   STAGE 1 CHEMO AND RADIATION  . Chronic systolic CHF (congestive heart failure) ([place])   . Colitis   . Dementia ([place])   . Febrile neutropenia ([place])   . Hypertension   . Hypokalemia   . LBBB (left bundle branch block)   . Movement disorder   . Mucositis   . Nonischemic cardiomyopathy ([place])    a. prior varying EF, 30% in 2017. s/p BSX CRTD 05/2016.  Marland Kitchen OSA (obstructive sleep apnea)   . Renal artery aneurysm ([place])   . S/P cardiac cath 2000   Normal coronaries  . Sleep apnea    uses CPAP nightly   Past Surgical History:  Procedure Laterality Date  . APPENDECTOMY    . CARDIAC CATHETERIZATION    . CARPAL TUNNEL RELEASE Left 09/01/2015   Procedure: LEFT CARPAL TUNNEL RELEASE;  Surgeon: Leanora Cover, MD;  Location: [facility];  Service: Orthopedics;  Laterality: Left;  . cataract surgery removal     bilateral  . CESAREAN SECTION    . CHOLECYSTECTOMY    . EP IMPLANTABLE DEVICE N/A 06/15/2016   Procedure: BiV ICD Insertion CRT-D;  Surgeon: Evans Lance, MD;  Location: [facility];  Service: Cardiovascular;  Laterality: N/A;  . HEMORROIDECTOMY    . OPEN REDUCTION INTERNAL FIXATION (ORIF) DISTAL RADIAL FRACTURE Left 04/28/2015   Procedure: OPEN REDUCTION INTERNAL FIXATION (ORIF) LEFT DISTAL RADIUS FRACTURE;  Surgeon: Leanora Cover, MD;  Location: [facility];  Service: Orthopedics;  Laterality: Left;  . PORT-A-CATH REMOVAL    . TONSILLECTOMY    . TUBAL LIGATION    . US ECHOCARDIOGRAPHY  3/12   EF 30 to 35%     Current Meds  Medication Sig  . carvedilol (COREG) 12.5 MG tablet Take 1 tablet (12.5 mg total) by mouth 2 (two) times daily. Please make annual appt for future refills. Thank you. 1st attempt.  . Cholecalciferol (VITAMIN D3) 2000 units TABS Take 2,000 Units by mouth daily.  . citalopram (CELEXA) 20 MG tablet TAKE 1  TABLET BY MOUTH EVERY DAY  . fluticasone (FLONASE) 50 MCG/ACT nasal spray USE 2 SPRAYS EACH NOSTRIL DAILY FOR ALLERGIES. USE DAILY  . furosemide (LASIX) 20 MG tablet TAKE 1 TABLET (20 MG TOTAL) BY MOUTH DAILY.  Marland Kitchen loratadine (CLARITIN) 10 MG tablet Take 10 mg by mouth daily.  Marland Kitchen losartan (COZAAR) 25 MG tablet Take 25 mg by mouth daily.  . memantine (NAMENDA) 10 MG tablet Take 10 mg by mouth 2 (two) times daily.  Marland Kitchen rOPINIRole (REQUIP) 0.5 MG tablet Take 1 tablet (0.5 mg total) by mouth at bedtime.     Allergies:   Codeine, Iohexol, and Shellfish allergy   Social History   Tobacco Use  . Smoking status: Never Smoker  . Smokeless tobacco: Never Used  Substance Use Topics  . Alcohol use: No    Alcohol/week: 0.0 standard drinks  . Drug use: No     Family Hx: The patient's family history includes Cancer in her brother; Diabetes in her sister.  ROS:   Please see the history of present illness.    All other systems reviewed and are negative.   Prior CV studies:   The following studies were reviewed today:  Echo 05/2016 Study Conclusions  - Left ventricle: The cavity size was normal. Wall thickness was   normal. The estimated ejection fraction was 30%. Diffuse   hypokinesis with septal-lateral dyssynchrony. Doppler parameters   are consistent with abnormal left ventricular relaxation (grade 1   diastolic dysfunction). - Aortic valve: There was no stenosis. There was trivial   regurgitation. - Mitral valve: There was no significant regurgitation. - Right ventricle: The cavity size was normal. Systolic function   was normal. - Tricuspid valve: Peak RV-RA gradient (S): 20 mm Hg. - Pulmonary arteries: PA peak pressure: 23 mm Hg (S). - Inferior vena cava: The vessel was normal in size. The   respirophasic diameter changes were in the normal range (= 50%),   consistent with normal central venous pressure.  Impressions:  - Normal LV size with EF 30%. Diffuse hypokinesis with    septal-lateral dyssynchrony. Normal RV size and systolic   function. No significant valvular abnormalities.  Labs/Other Tests and Data Reviewed:    EKG:  No ECG reviewed.  Recent Labs: No results found for requested labs within last 8760 hours.   Recent Lipid Panel Lab Results  Component Value Date/Time   CHOL 185 03/10/2013 11:42 AM   TRIG 91.0 03/10/2013 11:42 AM   HDL 57.30 03/10/2013 11:42 AM   CHOLHDL 3 03/10/2013 11:42 AM   LDLCALC 110 (H) 03/10/2013 11:42 AM    Wt Readings from Last 3 Encounters:  01/07/19 175 lb (79.4 kg)  12/25/18 179 lb 9.6 oz (81.5 kg)  12/22/18 178 lb (80.7 kg)     Objective:    Vital Signs:  Ht 5\' 5"  (1.651 m)   Wt 175 lb (79.4 kg)  BMI 29.12 kg/m    VITAL SIGNS:  reviewed GEN:  no acute distress NEURO:  alert and oriented x 3, no obvious focal deficit PSYCH:  normal affect  ASSESSMENT & PLAN:    1. Chronic systolic CHF - Euvolemic. Continue BB, ARB and lasix.   2. HTN - No BP cuff at home. Continue current medications.  3. S/p ICD - Followed by Dr. Ellwood Handler  COVID-19 Education: The signs and symptoms of COVID-19 were discussed with the patient and how to seek care for testing (follow up with PCP or arrange E-visit).  The importance of social distancing was discussed today.  Time:   Today, I have spent 9 minutes with the patient with telehealth technology discussing the above problems.     Medication Adjustments/Labs and Tests Ordered: Current medicines are reviewed at length with the patient today.  Concerns regarding medicines are outlined above.   Tests Ordered: No orders of the defined types were placed in this encounter.   Medication Changes: No orders of the defined types were placed in this encounter.   Follow Up:  In Person in 1 year(s)  Signed, Leanor Kail, [place]  01/07/2019 3:31 PM    [facility]

## 2019-01-07 NOTE — Patient Instructions (Addendum)
Medication Instructions:  Your physician recommends that you continue on your current medications as directed. Please refer to the Current Medication list given to you today.  If you need a refill on your cardiac medications before your next appointment, please call your pharmacy.   Lab work: NONe If you have labs (blood work) drawn today and your tests are completely normal, you will receive your results only by: Marland Kitchen MyChart Message (if you have MyChart) OR . A paper copy in the mail If you have any lab test that is abnormal or we need to change your treatment, we will call you to review the results.  Testing/Procedures: NONE  Follow-Up: . Your physician recommends that you schedule a follow-up appointment in: 1 year with DR. Nahser.  Any Other Special Instructions Will Be Listed Below (If Applicable).

## 2019-01-07 NOTE — Telephone Encounter (Signed)

## 2019-01-08 ENCOUNTER — Other Ambulatory Visit: Payer: Self-pay | Admitting: Nurse Practitioner

## 2019-01-08 DIAGNOSIS — I5023 Acute on chronic systolic (congestive) heart failure: Secondary | ICD-10-CM

## 2019-02-07 ENCOUNTER — Other Ambulatory Visit: Payer: Self-pay | Admitting: Gynecology

## 2019-02-09 ENCOUNTER — Other Ambulatory Visit: Payer: Self-pay | Admitting: Cardiovascular Disease

## 2019-02-09 DIAGNOSIS — I5023 Acute on chronic systolic (congestive) heart failure: Secondary | ICD-10-CM

## 2019-02-09 NOTE — Telephone Encounter (Signed)
CE was 12/22/18.

## 2019-02-16 ENCOUNTER — Other Ambulatory Visit: Payer: Self-pay | Admitting: Gynecology

## 2019-02-17 ENCOUNTER — Ambulatory Visit (INDEPENDENT_AMBULATORY_CARE_PROVIDER_SITE_OTHER): Payer: Medicare Other | Admitting: *Deleted

## 2019-02-17 DIAGNOSIS — I5022 Chronic systolic (congestive) heart failure: Secondary | ICD-10-CM

## 2019-02-17 DIAGNOSIS — I447 Left bundle-branch block, unspecified: Secondary | ICD-10-CM

## 2019-02-24 LAB — CUP PACEART REMOTE DEVICE CHECK
Battery Remaining Longevity: 132 mo
Brady Statistic RA Percent Paced: 0 %
Brady Statistic RV Percent Paced: 0 %
Date Time Interrogation Session: 20200908060707
HighPow Impedance: 73 Ohm
Implantable Lead Implant Date: 20171229
Implantable Lead Implant Date: 20171229
Implantable Lead Implant Date: 20171229
Implantable Lead Location: 753858
Implantable Lead Location: 753859
Implantable Lead Location: 753860
Implantable Lead Model: 292
Implantable Lead Model: 4677
Implantable Lead Model: 7740
Implantable Lead Serial Number: 423617
Implantable Lead Serial Number: 604122
Implantable Lead Serial Number: 686437
Implantable Pulse Generator Implant Date: 20171229
Lead Channel Impedance Value: 538 Ohm
Lead Channel Impedance Value: 575 Ohm
Lead Channel Impedance Value: 957 Ohm
Lead Channel Sensing Intrinsic Amplitude: 11 mV
Lead Channel Sensing Intrinsic Amplitude: 25 mV
Lead Channel Sensing Intrinsic Amplitude: 5.2 mV
Lead Channel Setting Pacing Amplitude: 2 V
Lead Channel Setting Pacing Amplitude: 2.5 V
Lead Channel Setting Pacing Pulse Width: 0.4 ms
Lead Channel Setting Sensing Sensitivity: 0.5 mV
Lead Channel Setting Sensing Sensitivity: 1 mV
Pulse Gen Serial Number: 169900

## 2019-03-04 NOTE — Progress Notes (Signed)
Remote ICD transmission.   

## 2019-03-26 ENCOUNTER — Encounter: Payer: Self-pay | Admitting: Gynecology

## 2019-05-31 ENCOUNTER — Other Ambulatory Visit: Payer: Self-pay | Admitting: Adult Health

## 2019-06-08 ENCOUNTER — Other Ambulatory Visit: Payer: Self-pay | Admitting: *Deleted

## 2019-06-08 MED ORDER — VALACYCLOVIR HCL 500 MG PO TABS: 500.0000 mg | ORAL_TABLET | Freq: Two times a day (BID) | ORAL | 1 refills | Status: DC

## 2019-06-19 ENCOUNTER — Other Ambulatory Visit: Payer: Self-pay | Admitting: Neurology

## 2019-06-20 ENCOUNTER — Other Ambulatory Visit: Payer: Self-pay | Admitting: Cardiovascular Disease

## 2019-06-20 DIAGNOSIS — I5023 Acute on chronic systolic (congestive) heart failure: Secondary | ICD-10-CM

## 2019-07-01 ENCOUNTER — Telehealth: Payer: Self-pay

## 2019-07-01 NOTE — Telephone Encounter (Signed)
Received refill request from [facility] for Valtrex 500. It was just prescribed on 06/08/19 for #30 w one refill.  Directions on that Rx said take one tab bid. However, in the past directions have said bid x 5 days (outbreak). I need to clarify how patient is taking this before refilling. If she is taking it daily for prophylactic use she might not need to take as much each day. I will check with provider depending on her response.  I called patient to speak with her but voice mail not set up. Hopefully she will see I called and call me.  I will try to call her again.

## 2019-07-06 ENCOUNTER — Encounter (HOSPITAL_BASED_OUTPATIENT_CLINIC_OR_DEPARTMENT_OTHER): Payer: Self-pay

## 2019-07-06 ENCOUNTER — Emergency Department (HOSPITAL_BASED_OUTPATIENT_CLINIC_OR_DEPARTMENT_OTHER)
Admission: EM | Admit: 2019-07-06 | Discharge: 2019-07-06 | Disposition: A | Discharge status: Skilled Nursing Facility | Payer: Medicare Other | Attending: Emergency Medicine | Admitting: Emergency Medicine

## 2019-07-06 ENCOUNTER — Other Ambulatory Visit: Payer: Self-pay

## 2019-07-06 DIAGNOSIS — Z95810 Presence of automatic (implantable) cardiac defibrillator: Secondary | ICD-10-CM | POA: Diagnosis not present

## 2019-07-06 DIAGNOSIS — I110 Hypertensive heart disease with heart failure: Secondary | ICD-10-CM | POA: Diagnosis not present

## 2019-07-06 DIAGNOSIS — N390 Urinary tract infection, site not specified: Secondary | ICD-10-CM | POA: Diagnosis not present

## 2019-07-06 DIAGNOSIS — R4182 Altered mental status, unspecified: Secondary | ICD-10-CM | POA: Diagnosis present

## 2019-07-06 DIAGNOSIS — F0390 Unspecified dementia without behavioral disturbance: Secondary | ICD-10-CM | POA: Insufficient documentation

## 2019-07-06 DIAGNOSIS — I5022 Chronic systolic (congestive) heart failure: Secondary | ICD-10-CM | POA: Insufficient documentation

## 2019-07-06 LAB — URINALYSIS, ROUTINE W REFLEX MICROSCOPIC
Glucose, UA: NEGATIVE mg/dL
Ketones, ur: NEGATIVE mg/dL
Leukocytes,Ua: NEGATIVE
Nitrite: NEGATIVE
Protein, ur: 30 mg/dL — AB
Specific Gravity, Urine: >1.03 — ABNORMAL HIGH (ref 1.005–1.030)
pH: 6 (ref 5.0–8.0)

## 2019-07-06 LAB — CBC
HCT: 39.7 % (ref 36.0–46.0)
Hemoglobin: 12.7 g/dL (ref 12.0–15.0)
MCH: 32.2 pg (ref 26.0–34.0)
MCHC: 32 g/dL (ref 30.0–36.0)
MCV: 100.5 fL — ABNORMAL HIGH (ref 80.0–100.0)
Platelets: 225 10*3/uL (ref 150–400)
RBC: 3.95 MIL/uL (ref 3.87–5.11)
RDW: 13.5 % (ref 11.5–15.5)
WBC: 7.6 10*3/uL (ref 4.0–10.5)
nRBC: 0 % (ref 0.0–0.2)

## 2019-07-06 LAB — COMPREHENSIVE METABOLIC PANEL
ALT: 49 U/L — ABNORMAL HIGH (ref 0–44)
AST: 31 U/L (ref 15–41)
Albumin: 3.7 g/dL (ref 3.5–5.0)
Alkaline Phosphatase: 51 U/L (ref 38–126)
Anion gap: 11 (ref 5–15)
BUN: 20 mg/dL (ref 8–23)
CO2: 23 mmol/L (ref 22–32)
Calcium: 8.9 mg/dL (ref 8.9–10.3)
Chloride: 109 mmol/L (ref 98–111)
Creatinine, Ser: 1.02 mg/dL — ABNORMAL HIGH (ref 0.44–1.00)
GFR calc Af Amer: >60 mL/min (ref >60)
GFR calc non Af Amer: 53 mL/min — ABNORMAL LOW (ref >60)
Glucose, Bld: 138 mg/dL — ABNORMAL HIGH (ref 70–99)
Potassium: 3.6 mmol/L (ref 3.5–5.1)
Sodium: 143 mmol/L (ref 135–145)
Total Bilirubin: 0.7 mg/dL (ref 0.3–1.2)
Total Protein: 7.2 g/dL (ref 6.5–8.1)

## 2019-07-06 LAB — URINALYSIS, MICROSCOPIC (REFLEX)

## 2019-07-06 LAB — CBG MONITORING, ED: Glucose-Capillary: 156 mg/dL — ABNORMAL HIGH (ref 70–99)

## 2019-07-06 MED ORDER — CEPHALEXIN 500 MG PO CAPS: 500.0000 mg | ORAL_CAPSULE | Freq: Two times a day (BID) | ORAL | 0 refills | Status: AC

## 2019-07-06 MED ORDER — SODIUM CHLORIDE 0.9 % IV SOLN
2.0000 g | Freq: Once | INTRAVENOUS | Status: AC
  Administered 2019-07-06: 2 g via INTRAVENOUS
  Filled 2019-07-06: qty 20

## 2019-07-06 MED ORDER — SODIUM CHLORIDE 0.9% FLUSH
3.0000 mL | Freq: Once | INTRAVENOUS | Status: DC
  Filled 2019-07-06: qty 3

## 2019-07-06 NOTE — ED Triage Notes (Signed)
Per daughter pt with confusion x 1 week-pt with hx of dementia and lives at [facility] NAD-slow gait-pt denies c/o except "i've been sweating a lot"

## 2019-07-06 NOTE — ED Provider Notes (Signed)
[name] Emergency Department Provider Note MRN:  [id number]  Arrival date & time: 07/06/19     Chief Complaint   Altered Mental Status   History of Present Illness   [name] is a 79 y.o. year-old female with a history of dementia presenting to the ED with chief complaint of altered mental status.  History of dementia but worsened mental status for the past week, less interactive, usually manages her own medications but has not been taking them properly.  No fever, no cough, no other obvious symptoms reported from family.  I was unable to obtain an accurate HPI, PMH, or ROS due to the patient's dementia.  Level 5 caveat.  Review of Systems  Positive for altered mental status.  Patient's Health History    Past Medical History:  Diagnosis Date  . Anemia   . 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 Cancer of colon ([place]) 08/2005   STAGE 1 CHEMO AND RADIATION  . Chronic systolic CHF (congestive heart failure) ([place])   . Colitis   . Dementia ([place])   . Febrile neutropenia ([place])   . Hypertension   . Hypokalemia   . LBBB (left bundle branch block)   . Movement disorder   . Mucositis   . Nonischemic cardiomyopathy ([place])    a. prior varying EF, 30% in 2017. s/p BSX CRTD 05/2016.  Marland Kitchen OSA (obstructive sleep apnea)   . 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 S/P cardiac cath 2000   Normal coronaries  . Sleep apnea    uses CPAP nightly    Past Surgical History:  Procedure Laterality Date  . APPENDECTOMY    . CARDIAC CATHETERIZATION    . CARPAL TUNNEL RELEASE Left 09/01/2015   Procedure: LEFT CARPAL TUNNEL RELEASE;  Surgeon: Leanora Cover, MD;  Location: [facility];  Service: Orthopedics;  Laterality: Left;  . cataract surgery removal     bilateral  . CESAREAN SECTION    . CHOLECYSTECTOMY    . EP IMPLANTABLE DEVICE N/A 06/15/2016   Procedure: BiV ICD Insertion CRT-D;  Surgeon: Evans Lance, MD;   Location: [facility];  Service: Cardiovascular;  Laterality: N/A;  . HEMORROIDECTOMY    . OPEN REDUCTION INTERNAL FIXATION (ORIF) DISTAL RADIAL FRACTURE Left 04/28/2015   Procedure: OPEN REDUCTION INTERNAL FIXATION (ORIF) LEFT DISTAL RADIUS FRACTURE;  Surgeon: Leanora Cover, MD;  Location: [facility];  Service: Orthopedics;  Laterality: Left;  . PORT-A-CATH REMOVAL    . TONSILLECTOMY    . TUBAL LIGATION    . US ECHOCARDIOGRAPHY  3/12   EF 30 to 35%    Family History  Problem Relation Age of Onset  . Diabetes Sister   . Cancer Brother        PROSTATE    Social History   Socioeconomic History  . Marital status: Divorced    Spouse name: Not on file  . Number of children: 2  . Years of education: 66  . Highest education level: Not on file  Occupational History    Employer: RETIRED  . Occupation: Retired  Tobacco Use  . Smoking status: Never Smoker  . Smokeless tobacco: Never Used  Substance and Sexual Activity  . Alcohol use: No    Alcohol/week: 0.0 standard drinks  . Drug use: No  . Sexual activity: Never    Comment: 1st intercourse 79 yo-More than 5 partners  Other Topics Concern  . Not on file  Social History Narrative   Patient lives at home alone.    Patient has 2 children.    Patient is retired.    Patient has a high school education.    Patient is divorced.    Patient is right handed.    Social Determinants of Health   Financial Resource Strain:   . Difficulty of Paying Living Expenses: Not on file  Food Insecurity:   . Worried About Charity fundraiser in the Last Year: Not on file  . Ran Out of Food in the Last Year: Not on file  Transportation Needs:   . Lack of Transportation (Medical): Not on file  . Lack of Transportation (Non-Medical): Not on file  Physical Activity:   . Days of Exercise per Week: Not on file  . Minutes of Exercise per Session: Not on file  Stress:   . Feeling of Stress : Not on file  Social Connections:   .  Frequency of Communication with Friends and Family: Not on file  . Frequency of Social Gatherings with Friends and Family: Not on file  . Attends Religious Services: Not on file  . Active Member of Clubs or Organizations: Not on file  . Attends Archivist Meetings: Not on file  . Marital Status: Not on file  Intimate Partner Violence:   . Fear of Current or Ex-Partner: Not on file  . Emotionally Abused: Not on file  . Physically Abused: Not on file  . Sexually Abused: Not on file     Physical Exam  Vital Signs and Nursing Notes reviewed Vitals:   07/06/19 2023 07/06/19 2059  BP: 137/71 137/71  Pulse: 86 81  Resp: 20 19  Temp: 98.7 F (37.1 C)   SpO2: 95% 91%    CONSTITUTIONAL: Well-appearing, NAD NEURO: Awake, interactive, oriented to name only, moving all extremities. EYES:  eyes equal and reactive ENT/NECK:  no LAD, no JVD CARDIO: Regular rate, well-perfused, normal S1 and S2 PULM:  CTAB no wheezing or rhonchi GI/GU:  normal bowel sounds, non-distended, non-tender MSK/SPINE:  No gross deformities, no edema SKIN:  no rash, atraumatic PSYCH:  Appropriate speech and behavior  Diagnostic and Interventional Summary    EKG Interpretation  Date/Time:    Ventricular Rate:    PR Interval:    QRS Duration:   QT Interval:    QTC Calculation:   R Axis:     Text Interpretation:        Labs Reviewed  COMPREHENSIVE METABOLIC PANEL - Abnormal; Notable for the following components:      Result Value   Glucose, Bld 138 (*)    Creatinine, Ser 1.02 (*)    ALT 49 (*)    GFR calc non Af Amer 53 (*)    All other components within normal limits  CBC - Abnormal; Notable for the following components:   MCV 100.5 (*)    All other components within normal limits  URINALYSIS, ROUTINE W REFLEX MICROSCOPIC - Abnormal; Notable for the following components:   APPearance CLOUDY (*)    Specific Gravity, Urine >1.030 (*)    Hgb urine dipstick LARGE (*)    Bilirubin Urine  SMALL (*)    Protein, ur 30 (*)    All other components within normal limits  URINALYSIS, MICROSCOPIC (REFLEX) - Abnormal; Notable for the following components:   Bacteria, UA FEW (*)    All other components within normal limits  CBG MONITORING, ED - Abnormal; Notable for the following components:  Glucose-Capillary 156 (*)    All other components within normal limits  URINE CULTURE    No orders to display    Medications  sodium chloride flush (NS) 0.9 % injection 3 mL (has no administration in time range)  cefTRIAXone (ROCEPHIN) 2 g in sodium chloride 0.9 % 100 mL IVPB (2 g Intravenous New Bag/Given 07/06/19 2109)     Procedures  /  Critical Care Procedures  ED Course and Medical Decision Making  I have reviewed the triage vital signs, the nursing notes, and pertinent available records from the EMR.  Pertinent labs & imaging results that were available during my care of the patient were reviewed by me and considered in my medical decision making (see below for details).     Worsened mental status in the 79 year old female with history of dementia, work-up revealing likely urinary tract infection as the cause.  No focal deficits to warrant CNS imaging.  Vital signs are reassuring, no fever, no systemic symptoms otherwise.  Discussed management options with patient's daughter, and we are in agreement with first dose of antibiotics in the emergency department followed by discharge back to her facility with p.o. prescription.   Barth Kirks. Sedonia Small, [place] [email or web address]  Final Clinical Impressions(s) / ED Diagnoses     ICD-10-CM   1. Lower urinary tract infectious disease  N39.0     ED Discharge Orders         Ordered    cephALEXin (KEFLEX) 500 MG capsule  2 times daily     07/06/19 2129           Discharge Instructions Discussed with and Provided to Patient:     Discharge Instructions     You were evaluated  in the Emergency Department and after careful evaluation, we did not find any emergent condition requiring admission or further testing in the hospital.  Your exam/testing today was overall reassuring.  Your testing shows evidence of a urinary tract infection.  We are giving you your first dose of antibiotics here in the emergency department.  Please fill the prescription provided and take as directed.  Please return to the Emergency Department if you experience any worsening of your condition.  We encourage you to follow up with a primary care provider.  Thank you for allowing [place] to be a part of your care.        Maudie Flakes, MD 07/06/19 2130

## 2019-07-06 NOTE — Discharge Instructions (Addendum)
You were evaluated in the Emergency Department and after careful evaluation, we did not find any emergent condition requiring admission or further testing in the hospital.  Your exam/testing today was overall reassuring.  Your testing shows evidence of a urinary tract infection.  We are giving you your first dose of antibiotics here in the emergency department.  Please fill the prescription provided and take as directed.  Please return to the Emergency Department if you experience any worsening of your condition.  We encourage you to follow up with a primary care provider.  Thank you for allowing [place] to be a part of your care.

## 2019-07-07 ENCOUNTER — Ambulatory Visit (INDEPENDENT_AMBULATORY_CARE_PROVIDER_SITE_OTHER): Payer: Medicare Other | Admitting: *Deleted

## 2019-07-07 ENCOUNTER — Telehealth: Payer: Self-pay

## 2019-07-07 DIAGNOSIS — I5022 Chronic systolic (congestive) heart failure: Secondary | ICD-10-CM | POA: Diagnosis not present

## 2019-07-07 LAB — URINE CULTURE: Culture: >=10000 — AB

## 2019-07-07 NOTE — Telephone Encounter (Signed)
Spoke with the patient and she stated that she just using it just because. I advised her that by not using she puts herself at risk for stroke, heart attack and untreated sleep apnea. She stated her daughter also told she needs to start using it. Patient verbalized that she would start using her machine tonight. She was very appreciative for the call. No other questions or concerns at this time.

## 2019-07-08 ENCOUNTER — Ambulatory Visit: Payer: PRIVATE HEALTH INSURANCE | Admitting: Adult Health

## 2019-07-08 ENCOUNTER — Encounter: Payer: Self-pay | Admitting: Adult Health

## 2019-07-08 ENCOUNTER — Telehealth: Payer: Self-pay | Admitting: *Deleted

## 2019-07-08 LAB — CUP PACEART REMOTE DEVICE CHECK
Date Time Interrogation Session: 20210120081005
Implantable Lead Implant Date: 20171229
Implantable Lead Implant Date: 20171229
Implantable Lead Implant Date: 20171229
Implantable Lead Location: 753858
Implantable Lead Location: 753859
Implantable Lead Location: 753860
Implantable Lead Model: 292
Implantable Lead Model: 4677
Implantable Lead Model: 7740
Implantable Lead Serial Number: 423617
Implantable Lead Serial Number: 604122
Implantable Lead Serial Number: 686437
Implantable Pulse Generator Implant Date: 20171229
Pulse Gen Serial Number: 169900

## 2019-07-08 NOTE — Telephone Encounter (Signed)
Scheduled remote received 07/08/19 reveals 20 VT-1 monitor zone episodes on 07/01/19, longest 12hr 58min, V rates 170s. 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 Presenting rhythm AS/VS 90s with occasional PVCs. No treated episodes. Pt is overdue for f/u with Dr. Lovena Le as of 06/2018.   Spoke with patient. She reports she cannot remember that far back regarding any symptoms that day. Reports she is upset today as she forgot about her neurology appointment and will not be able to make it to the appointment. She is planning to call to reschedule. Asked if her daughter is aware of the appointment and pt reports her daughter typically helps to arrange her appointments. Advised will ask EP scheduler to reach out to [facility] to schedule appointment. Pt is agreeable to this plan and denies additional needs at this time.

## 2019-07-09 NOTE — Telephone Encounter (Signed)
Forwarded to scheduler for assistance setting up f/u.

## 2019-07-09 NOTE — Telephone Encounter (Signed)
I'd like to see her in the next week. GT

## 2019-07-14 NOTE — Telephone Encounter (Signed)
Patient is scheduled for f/u with Dr. Lovena Le on 07/16/19.

## 2019-07-16 ENCOUNTER — Ambulatory Visit (INDEPENDENT_AMBULATORY_CARE_PROVIDER_SITE_OTHER): Payer: Medicare Other | Admitting: Internal Medicine

## 2019-07-16 ENCOUNTER — Other Ambulatory Visit: Payer: Self-pay

## 2019-07-16 ENCOUNTER — Encounter: Payer: Self-pay | Admitting: Internal Medicine

## 2019-07-16 VITALS — BP 128/68 | HR 95 | Ht 65.0 in | Wt 189.0 lb

## 2019-07-16 DIAGNOSIS — Z95810 Presence of automatic (implantable) cardiac defibrillator: Secondary | ICD-10-CM | POA: Diagnosis not present

## 2019-07-16 DIAGNOSIS — I5023 Acute on chronic systolic (congestive) heart failure: Secondary | ICD-10-CM

## 2019-07-16 DIAGNOSIS — I10 Essential (primary) hypertension: Secondary | ICD-10-CM | POA: Diagnosis not present

## 2019-07-16 DIAGNOSIS — I5022 Chronic systolic (congestive) heart failure: Secondary | ICD-10-CM | POA: Diagnosis not present

## 2019-07-16 LAB — CUP PACEART INCLINIC DEVICE CHECK
Date Time Interrogation Session: 20210128171943
Implantable Lead Implant Date: 20171229
Implantable Lead Implant Date: 20171229
Implantable Lead Implant Date: 20171229
Implantable Lead Location: 753858
Implantable Lead Location: 753859
Implantable Lead Location: 753860
Implantable Lead Model: 292
Implantable Lead Model: 4677
Implantable Lead Model: 7740
Implantable Lead Serial Number: 423617
Implantable Lead Serial Number: 604122
Implantable Lead Serial Number: 686437
Implantable Pulse Generator Implant Date: 20171229
Pulse Gen Serial Number: 169900

## 2019-07-16 MED ORDER — FUROSEMIDE 20 MG PO TABS: 20.0000 mg | ORAL_TABLET | Freq: Every day | ORAL | 3 refills | Status: DC

## 2019-07-16 NOTE — Patient Instructions (Signed)
Medication Instructions:  Your physician recommends that you continue on your current medications as directed. Please refer to the Current Medication list given to you today.  Labwork: None ordered.  Testing/Procedures: None ordered.  Follow-Up: Your physician wants you to follow-up in: one year with Dr. Lovena Le.   You will receive a reminder letter in the mail two months in advance. If you don't receive a letter, please call our office to schedule the follow-up appointment.  Remote monitoring is used to monitor your ICD from home. This monitoring reduces the number of office visits required to check your device to one time per year. It allows [place] to keep an eye on the functioning of your device to ensure it is working properly. You are scheduled for a device check from home on 10/06/2019. You may send your transmission at any time that day. If you have a wireless device, the transmission will be sent automatically.   Any Other Special Instructions Will Be Listed Below (If Applicable).  If you need a refill on your cardiac medications before your next appointment, please call your pharmacy.

## 2019-07-16 NOTE — Progress Notes (Signed)
HPI [name] returns today for followup. She is a pleasant 79 yo woman with a non-ischemic CM, LBBB, s/p BiV ICD insertion. She has had diaghragmatic stimulation and we turned off her LV lead. She has been noted to have recurrent episodes of SVT. She does not have palpitations but gets flushed and diaphoretic and has documented HR's in the 170 range. She has not had syncope and she denies associated chest pain. No ICD shocks.  Allergies  Allergen Reactions  . Codeine     Upset stomach    . Iohexol Hives     Desc: Pt. states she broke out in hives 30 yrs ago w/ a CT Brain scan.  She has since had a heart cath and this CT today (08/10/05) w/o premeds and did well. No evident reaction.  . Shellfish Allergy Itching    Past history of itching with shrimp, has a history of allergy shots , no problem with seafood in years per patient (??)     Current Outpatient Medications  Medication Sig Dispense Refill  . carvedilol (COREG) 12.5 MG tablet Take 1 tablet (12.5 mg total) by mouth 2 (two) times a day. 60 tablet 11  . Cholecalciferol (VITAMIN D3) 2000 units TABS Take 2,000 Units by mouth daily.    . citalopram (CELEXA) 20 MG tablet TAKE 1 TABLET BY MOUTH EVERY DAY 90 tablet 0  . fluticasone (FLONASE) 50 MCG/ACT nasal spray USE 2 SPRAYS EACH NOSTRIL DAILY FOR ALLERGIES. USE DAILY  6  . furosemide (LASIX) 20 MG tablet Take 1 tablet (20 mg total) by mouth daily. 90 tablet 3  . loratadine (CLARITIN) 10 MG tablet Take 10 mg by mouth daily.    Marland Kitchen losartan (COZAAR) 25 MG tablet Take 25 mg by mouth daily.    . memantine (NAMENDA) 10 MG tablet Take 10 mg by mouth 2 (two) times daily.  1  . rOPINIRole (REQUIP) 0.5 MG tablet TAKE 1 TABLET (0.5 MG TOTAL) BY MOUTH AT BEDTIME. 90 tablet 3   No current facility-administered medications for this visit.     Past Medical History:  Diagnosis Date  . Anemia   . 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 Cancer of  colon ([place]) 08/2005   STAGE 1 CHEMO AND RADIATION  . Chronic systolic CHF (congestive heart failure) ([place])   . Colitis   . Dementia ([place])   . Febrile neutropenia ([place])   . Hypertension   . Hypokalemia   . LBBB (left bundle branch block)   . Movement disorder   . Mucositis   . Nonischemic cardiomyopathy ([place])    a. prior varying EF, 30% in 2017. s/p BSX CRTD 05/2016.  Marland Kitchen OSA (obstructive sleep apnea)   . 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 S/P cardiac cath 2000   Normal coronaries  . Sleep apnea    uses CPAP nightly    ROS:   All systems reviewed and negative except as noted in the HPI.   Past Surgical History:  Procedure Laterality Date  . APPENDECTOMY    . CARDIAC CATHETERIZATION    . CARPAL TUNNEL RELEASE Left 09/01/2015   Procedure: LEFT CARPAL TUNNEL RELEASE;  Surgeon: Leanora Cover, MD;  Location: [facility];  Service: Orthopedics;  Laterality: Left;  . cataract surgery removal     bilateral  . CESAREAN SECTION    . CHOLECYSTECTOMY    . EP IMPLANTABLE DEVICE N/A 06/15/2016   Procedure: BiV ICD Insertion CRT-D;  Surgeon: Evans Lance, MD;  Location: [facility];  Service: Cardiovascular;  Laterality: N/A;  . HEMORROIDECTOMY    . OPEN REDUCTION INTERNAL FIXATION (ORIF) DISTAL RADIAL FRACTURE Left 04/28/2015   Procedure: OPEN REDUCTION INTERNAL FIXATION (ORIF) LEFT DISTAL RADIUS FRACTURE;  Surgeon: Leanora Cover, MD;  Location: [facility];  Service: Orthopedics;  Laterality: Left;  . PORT-A-CATH REMOVAL    . TONSILLECTOMY    . TUBAL LIGATION    . US ECHOCARDIOGRAPHY  3/12   EF 30 to 35%     Family History  Problem Relation Age of Onset  . Diabetes Sister   . Cancer Brother        PROSTATE     Social History   Socioeconomic History  . Marital status: Divorced    Spouse name: Not on file  . Number of children: 2  . Years of education: 2  . Highest education level: Not on file  Occupational History    Employer: RETIRED  .  Occupation: Retired  Tobacco Use  . Smoking status: Never Smoker  . Smokeless tobacco: Never Used  Substance and Sexual Activity  . Alcohol use: No    Alcohol/week: 0.0 standard drinks  . Drug use: No  . Sexual activity: Never    Comment: 1st intercourse 79 yo-More than 5 partners  Other Topics Concern  . Not on file  Social History Narrative   Patient lives at home alone.    Patient has 2 children.    Patient is retired.    Patient has a high school education.    Patient is divorced.    Patient is right handed.    Social Determinants of Health   Financial Resource Strain:   . Difficulty of Paying Living Expenses: Not on file  Food Insecurity:   . Worried About Charity fundraiser in the Last Year: Not on file  . Ran Out of Food in the Last Year: Not on file  Transportation Needs:   . Lack of Transportation (Medical): Not on file  . Lack of Transportation (Non-Medical): Not on file  Physical Activity:   . Days of Exercise per Week: Not on file  . Minutes of Exercise per Session: Not on file  Stress:   . Feeling of Stress : Not on file  Social Connections:   . Frequency of Communication with Friends and Family: Not on file  . Frequency of Social Gatherings with Friends and Family: Not on file  . Attends Religious Services: Not on file  . Active Member of Clubs or Organizations: Not on file  . Attends Archivist Meetings: Not on file  . Marital Status: Not on file  Intimate Partner Violence:   . Fear of Current or Ex-Partner: Not on file  . Emotionally Abused: Not on file  . Physically Abused: Not on file  . Sexually Abused: Not on file     BP 128/68   Pulse 95   Ht 5\' 5"  (1.651 m)   Wt 189 lb (85.7 kg)   SpO2 94%   BMI 31.45 kg/m   Physical Exam:  Well appearing 79 yo woman, NAD HEENT: Unremarkable Neck:  No JVD, no thyromegally Lymphatics:  No adenopathy Back:  No CVA tenderness Lungs:  Clear HEART:  Regular rate rhythm, no murmurs, no rubs,  no clicks Abd:  soft, positive bowel sounds, no organomegally, no rebound, no guarding Ext:  2 plus pulses, no edema, no cyanosis, no clubbing Skin:  No rashes no nodules Neuro:  CN II through XII intact, motor grossly intact though she does appear to have some memory loss  EKG - NSR with LBBB  DEVICE  Normal device function.  See PaceArt for details.   Assess/Plan: 1. SVT - she appears to have recurrent SVT and is mildly symptomatic. She admits to excess caffeine intake and I have asked her to reduce this to no more than one cup a day. If her SVT continues then we would try and uptitrate to 18.375 bid of her coreg. Finally, catheter ablation is an option but we will try and avoid this as possible. 2. Chronic systolic heart failure - her symptoms are class 2. She will continue her current meds. 3. HTN- her bp is up minimally today. We will follow with no changes in her meds for now. 4. ICD -her [employer] device is working normally. Her SVT is falling in the monitor only zone.   Mikle Bosworth.D.

## 2019-07-22 ENCOUNTER — Other Ambulatory Visit: Payer: Self-pay

## 2019-07-22 ENCOUNTER — Encounter: Payer: Self-pay | Admitting: Adult Health

## 2019-07-22 ENCOUNTER — Ambulatory Visit (INDEPENDENT_AMBULATORY_CARE_PROVIDER_SITE_OTHER): Payer: Medicare Other | Admitting: Adult Health

## 2019-07-22 VITALS — BP 111/60 | HR 90 | Temp 97.2°F | Ht 65.0 in | Wt 189.0 lb

## 2019-07-22 DIAGNOSIS — R413 Other amnesia: Secondary | ICD-10-CM

## 2019-07-22 DIAGNOSIS — G4733 Obstructive sleep apnea (adult) (pediatric): Secondary | ICD-10-CM

## 2019-07-22 DIAGNOSIS — G2581 Restless legs syndrome: Secondary | ICD-10-CM | POA: Diagnosis not present

## 2019-07-22 DIAGNOSIS — Z9989 Dependence on other enabling machines and devices: Secondary | ICD-10-CM | POA: Diagnosis not present

## 2019-07-22 MED ORDER — CITALOPRAM HYDROBROMIDE 20 MG PO TABS: 20.0000 mg | ORAL_TABLET | Freq: Every day | ORAL | 3 refills | Status: DC

## 2019-07-22 MED ORDER — MEMANTINE HCL 10 MG PO TABS: 10.0000 mg | ORAL_TABLET | Freq: Two times a day (BID) | ORAL | 3 refills | Status: DC

## 2019-07-22 NOTE — Progress Notes (Signed)
PATIENT: Samantha [name] DOB: 1941/01/03  REASON FOR VISIT: follow up HISTORY FROM: patient  HISTORY OF PRESENT ILLNESS: Today 07/22/19:  Samantha [name] is a 79 year old female with a history of memory disturbance, restless leg and obstructive sleep apnea on CPAP.  She returns today for follow-up with her daughter.  Her CPAP download indicates that she used her machine 61 out of 90 days for compliance of 68%.  She use her machine 46 and 90 days for compliance of 51%.  On average she uses her machine 6 hours and 53 minutes.  Her residual AHI is 8 on 11 cm of water with EPR of 1.  Her leak in the 95th percentile is 27.6 L/min.  Daughter reports that she did not use it in the month of January patient was diagnosed with a UTI.  In regards to her memory her daughter report reports that her memory may be slightly worse.  She now lives at [facility].  She no longer cooks she uses their dining hall.  Her daughter manages her finances.  She denies any trouble sleeping.  Daughter reports that she may get a little agitated if she gets confused.  Denies hallucinations.  Restless leg seems to be controlled with Requip.  Patient has no complaints today.    HISTORY Samantha [name] is a 79 year old female with a history of memory disturbance, restless leg and obstructive sleep apnea on CPAP.  She feels that her memory is slightly worse.  She now lives at home alone.  She has a granddaughter that lives down the street that checks on her frequently.  She reports that she is able to complete all ADLs independently.  She does cook but often goes out to get food.  She operates a Teacher, music without difficulty.  States that she only drives short distances.  She continues to be upset with her children for the amount of time they spend with her and call.  She states that Requip continues to work well for her restless legs.  She continues to use the CPAP machine.  She brought her machine with her but does not have a  card.  We are not able to obtain a wireless download.  REVIEW OF SYSTEMS: Out of a complete 14 system review of symptoms, the patient complains only of the following symptoms, and all other reviewed systems are negative.   See HPI Epworth sleepiness score 6 fatigue severity score 52  ALLERGIES: Allergies  Allergen Reactions  . Codeine     Upset stomach    . Iohexol Hives     Desc: Pt. states she broke out in hives 30 yrs ago w/ a CT Brain scan.  She has since had a heart cath and this CT today (08/10/05) w/o premeds and did well. No evident reaction.  . Shellfish Allergy Itching    Past history of itching with shrimp, has a history of allergy shots , no problem with seafood in years per patient (??)    HOME MEDICATIONS: Outpatient Medications Prior to Visit  Medication Sig Dispense Refill  . carvedilol (COREG) 12.5 MG tablet Take 1 tablet (12.5 mg total) by mouth 2 (two) times a day. 60 tablet 11  . Cholecalciferol (VITAMIN D3) 2000 units TABS Take 2,000 Units by mouth daily.    . citalopram (CELEXA) 20 MG tablet TAKE 1 TABLET BY MOUTH EVERY DAY 90 tablet 0  . furosemide (LASIX) 20 MG tablet Take 1 tablet (20 mg total) by mouth daily. [place]  tablet 3  . losartan (COZAAR) 25 MG tablet Take 25 mg by mouth daily.    . memantine (NAMENDA) 10 MG tablet Take 10 mg by mouth 2 (two) times daily.  1  . rOPINIRole (REQUIP) 0.5 MG tablet TAKE 1 TABLET (0.5 MG TOTAL) BY MOUTH AT BEDTIME. 90 tablet 3  . loratadine (CLARITIN) 10 MG tablet Take 10 mg by mouth daily.    . fluticasone (FLONASE) 50 MCG/ACT nasal spray USE 2 SPRAYS EACH NOSTRIL DAILY FOR ALLERGIES. USE DAILY  6   No facility-administered medications prior to visit.    PAST MEDICAL HISTORY: Past Medical History:  Diagnosis Date  . Anemia   . 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 Cancer of colon ([place]) 08/2005   STAGE 1 CHEMO AND RADIATION  . Chronic systolic CHF (congestive heart failure)  ([place])   . Colitis   . Dementia ([place])   . Febrile neutropenia ([place])   . Hypertension   . Hypokalemia   . LBBB (left bundle branch block)   . Movement disorder   . Mucositis   . Nonischemic cardiomyopathy ([place])    a. prior varying EF, 30% in 2017. s/p BSX CRTD 05/2016.  [place] OSA (obstructive sleep apnea)   . 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 S/P cardiac cath 2000   Normal coronaries  . Sleep apnea    uses CPAP nightly    PAST SURGICAL HISTORY: Past Surgical History:  Procedure Laterality Date  . APPENDECTOMY    . CARDIAC CATHETERIZATION    . CARPAL TUNNEL RELEASE Left 09/01/2015   Procedure: LEFT CARPAL TUNNEL RELEASE;  Surgeon: Leanora Cover, MD;  Location: [facility];  Service: Orthopedics;  Laterality: Left;  . cataract surgery removal     bilateral  . CESAREAN SECTION    . CHOLECYSTECTOMY    . EP IMPLANTABLE DEVICE N/A 06/15/2016   Procedure: BiV ICD Insertion CRT-D;  Surgeon: Evans Lance, MD;  Location: [facility];  Service: Cardiovascular;  Laterality: N/A;  . HEMORROIDECTOMY    . OPEN REDUCTION INTERNAL FIXATION (ORIF) DISTAL RADIAL FRACTURE Left 04/28/2015   Procedure: OPEN REDUCTION INTERNAL FIXATION (ORIF) LEFT DISTAL RADIUS FRACTURE;  Surgeon: Leanora Cover, MD;  Location: [facility];  Service: Orthopedics;  Laterality: Left;  . PORT-A-CATH REMOVAL    . TONSILLECTOMY    . TUBAL LIGATION    . US ECHOCARDIOGRAPHY  3/12   EF 30 to 35%    FAMILY HISTORY: Family History  Problem Relation Age of Onset  . Diabetes Sister   . Cancer Brother        PROSTATE    SOCIAL HISTORY: Social History   Socioeconomic History  . Marital status: Divorced    Spouse name: Not on file  . Number of children: 2  . Years of education: 2  . Highest education level: Not on file  Occupational History    Employer: RETIRED  . Occupation: Retired  Tobacco Use  . Smoking status: Never Smoker  . Smokeless tobacco: Never Used  Substance and Sexual  Activity  . Alcohol use: No    Alcohol/week: 0.0 standard drinks  . Drug use: No  . Sexual activity: Never    Comment: 1st intercourse 79 yo-More than 5 partners  Other Topics Concern  . Not on file  Social History Narrative   Patient lives at home alone.    Patient has 2 children.    Patient is retired.  Patient has a high school education.    Patient is divorced.    Patient is right handed.    Social Determinants of Health   Financial Resource Strain:   . Difficulty of Paying Living Expenses: Not on file  Food Insecurity:   . Worried About Charity fundraiser in the Last Year: Not on file  . Ran Out of Food in the Last Year: Not on file  Transportation Needs:   . Lack of Transportation (Medical): Not on file  . Lack of Transportation (Non-Medical): Not on file  Physical Activity:   . Days of Exercise per Week: Not on file  . Minutes of Exercise per Session: Not on file  Stress:   . Feeling of Stress : Not on file  Social Connections:   . Frequency of Communication with Friends and Family: Not on file  . Frequency of Social Gatherings with Friends and Family: Not on file  . Attends Religious Services: Not on file  . Active Member of Clubs or Organizations: Not on file  . Attends Archivist Meetings: Not on file  . Marital Status: Not on file  Intimate Partner Violence:   . Fear of Current or Ex-Partner: Not on file  . Emotionally Abused: Not on file  . Physically Abused: Not on file  . Sexually Abused: Not on file      PHYSICAL EXAM  Vitals:   07/22/19 1108  BP: 111/60  Pulse: 90  Temp: (!) 97.2 F (36.2 C)  Weight: 189 lb (85.7 kg)  Height: 5\' 5"  (1.651 m)   Body mass index is 31.45 kg/m.  MMSE - Mini Mental State Exam 07/22/2019 12/25/2018 04/24/2018  Not completed: - - (No Data)  Orientation to time 4 3 4   Orientation to Place 4 4 4   Registration 3 3 3   Attention/ Calculation 1 5 1   Attention/Calculation-comments - - -  Recall 3 1 1     Language- name 2 objects 2 2 2   Language- repeat 1 1 1   Language- follow 3 step command 3 3 2   Language- read & follow direction 1 1 1   Write a sentence 1 1 1   Copy design 1 1 1   Copy design-comments - 10 animals -  Total score 24 25 21      Generalized: Well developed, in no acute distress   Neurological examination  Mentation: Alert oriented to time, place, history taking. Follows all commands speech and language fluent Cranial nerve II-XII: Pupils were equal round reactive to light. Extraocular movements were full, visual field were full on confrontational test. . Head turning and shoulder shrug  were normal and symmetric. Motor: The motor testing reveals 5 over 5 strength of all 4 extremities. Good symmetric motor tone is noted throughout.  Sensory: Sensory testing is intact to soft touch on all 4 extremities. No evidence of extinction is noted.  Coordination: Cerebellar testing reveals good finger-nose-finger and heel-to-shin bilaterally.  Gait and station: Gait is normal.  Reflexes: Deep tendon reflexes are symmetric and normal bilaterally.   DIAGNOSTIC DATA (LABS, IMAGING, TESTING) - I reviewed patient records, labs, notes, testing and imaging myself where available.  Lab Results  Component Value Date   WBC 7.6 07/06/2019   HGB 12.7 07/06/2019   HCT 39.7 07/06/2019   MCV 100.5 (H) 07/06/2019   PLT 225 07/06/2019      Component Value Date/Time   NA 143 07/06/2019 1925   NA 141 10/03/2016 1355   NA 141 10/01/2011 1302  K 3.6 07/06/2019 1925   K 4.3 10/01/2011 1302   CL 109 07/06/2019 1925   CL 100 10/01/2011 1302   CO2 23 07/06/2019 1925   CO2 30 10/01/2011 1302   GLUCOSE 138 (H) 07/06/2019 1925   GLUCOSE 93 10/01/2011 1302   BUN 20 07/06/2019 1925   BUN 17 10/03/2016 1355   BUN 18 10/01/2011 1302   CREATININE 1.02 (H) 07/06/2019 1925   CREATININE 1.17 (H) 06/05/2016 1549   CALCIUM 8.9 07/06/2019 1925   CALCIUM 8.8 10/01/2011 1302   PROT 7.2 07/06/2019 1925    PROT 6.8 11/25/2014 1427   PROT 7.2 10/01/2011 1302   ALBUMIN 3.7 07/06/2019 1925   ALBUMIN 4.5 11/25/2014 1427   AST 31 07/06/2019 1925   AST 22 10/01/2011 1302   ALT 49 (H) 07/06/2019 1925   ALT 23 10/01/2011 1302   ALKPHOS 51 07/06/2019 1925   ALKPHOS 69 10/01/2011 1302   BILITOT 0.7 07/06/2019 1925   BILITOT 0.3 11/25/2014 1427   BILITOT 0.40 10/01/2011 1302   GFRNONAA 53 (L) 07/06/2019 1925   GFRAA >60 07/06/2019 1925   Lab Results  Component Value Date   CHOL 185 03/10/2013   HDL 57.30 03/10/2013   LDLCALC 110 (H) 03/10/2013   TRIG 91.0 03/10/2013   CHOLHDL 3 03/10/2013   No results found for: HGBA1C Lab Results  Component Value Date   VITAMINB12 334 10/15/2017   Lab Results  Component Value Date   TSH 1.580 10/15/2017      ASSESSMENT AND PLAN 79 y.o. year old female  has a past medical history of Anemia, Biventricular ICD (implantable cardioverter-defibrillator) in place (05/2016), Cancer of colon ([place]) (0000000), Chronic systolic CHF (congestive heart failure) ([place]), Colitis, Dementia ([place]), Febrile neutropenia ([place]), Hypertension, Hypokalemia, LBBB (left bundle branch block), Movement disorder, Mucositis, Nonischemic cardiomyopathy ([place]), OSA (obstructive sleep apnea), Renal artery aneurysm ([place]), S/P cardiac cath (2000), and Sleep apnea. here with:  1.  Obstructive sleep apnea on CPAP  -Patient was encouraged to try to use the CPAP nightly and greater than 4 hours each night -Residual AHI is slightly elevated pressure will be increased to 12 cmH2O  2.  Restless leg syndrome  -Continue Requip 0.5 mg at bedtime  3.  Memory disturbance  -MMSE has remained stable -Continue Namenda 10 mg twice a day -Continue to monitor symptoms  Advised if symptoms worsen or she develops new symptoms she should let us know.  Follow-up in 6 months or sooner if needed. Ward Givens, MSN, NP-C 07/22/2019, 11:13 AM [facility] [address],  [place], [place] [address] [phone]

## 2019-07-22 NOTE — Patient Instructions (Signed)
Your Plan:  Continue namenda Memory score is stable Continue Requip for RLS  CPAP pressure will be adjusted If your symptoms worsen or you develop new symptoms please let us know.   Thank you for coming to see [place] at [facility]. I hope we have been able to provide you high quality care today.  You may receive a patient satisfaction survey over the next few weeks. We would appreciate your feedback and comments so that we may continue to improve ourselves and the health of our patients.

## 2019-09-17 ENCOUNTER — Other Ambulatory Visit: Payer: Self-pay | Admitting: Neurology

## 2019-09-23 ENCOUNTER — Ambulatory Visit: Payer: PRIVATE HEALTH INSURANCE | Admitting: Adult Health

## 2019-10-06 ENCOUNTER — Ambulatory Visit (INDEPENDENT_AMBULATORY_CARE_PROVIDER_SITE_OTHER): Payer: Medicare Other | Admitting: *Deleted

## 2019-10-06 DIAGNOSIS — I5022 Chronic systolic (congestive) heart failure: Secondary | ICD-10-CM

## 2019-10-07 LAB — CUP PACEART REMOTE DEVICE CHECK
Battery Remaining Longevity: 126 mo
Battery Remaining Percentage: 100 %
Brady Statistic RA Percent Paced: 0 %
Brady Statistic RV Percent Paced: 0 %
Date Time Interrogation Session: 20210421031200
HighPow Impedance: 79 Ohm
Implantable Lead Implant Date: 20171229
Implantable Lead Implant Date: 20171229
Implantable Lead Implant Date: 20171229
Implantable Lead Location: 753858
Implantable Lead Location: 753859
Implantable Lead Location: 753860
Implantable Lead Model: 292
Implantable Lead Model: 4677
Implantable Lead Model: 7740
Implantable Lead Serial Number: 423617
Implantable Lead Serial Number: 604122
Implantable Lead Serial Number: 686437
Implantable Pulse Generator Implant Date: 20171229
Lead Channel Impedance Value: 537 Ohm
Lead Channel Impedance Value: 669 Ohm
Lead Channel Impedance Value: 786 Ohm
Lead Channel Setting Pacing Amplitude: 2 V
Lead Channel Setting Pacing Amplitude: 2.5 V
Lead Channel Setting Pacing Pulse Width: 0.4 ms
Lead Channel Setting Sensing Sensitivity: 0.5 mV
Lead Channel Setting Sensing Sensitivity: 1 mV
Pulse Gen Serial Number: 169900

## 2019-10-07 NOTE — Progress Notes (Signed)
ICD Remote  

## 2019-10-17 ENCOUNTER — Other Ambulatory Visit: Payer: Self-pay | Admitting: Nurse Practitioner

## 2019-10-17 DIAGNOSIS — I5023 Acute on chronic systolic (congestive) heart failure: Secondary | ICD-10-CM

## 2019-10-29 ENCOUNTER — Other Ambulatory Visit: Payer: Self-pay | Admitting: Adult Health

## 2019-11-05 ENCOUNTER — Other Ambulatory Visit: Payer: Self-pay | Admitting: Adult Health

## 2019-11-05 NOTE — Telephone Encounter (Signed)
Called daughter, [name] on [place] and LVM to  Inform her of refill request. I advised namenda was refilled in Feb x 1 year. Left # for questions.

## 2019-11-05 NOTE — Telephone Encounter (Signed)
Pt is needing a refill on her memantine (NAMENDA) 10 MG tablet sent in to the [facility] on [address].

## 2020-01-05 ENCOUNTER — Ambulatory Visit (INDEPENDENT_AMBULATORY_CARE_PROVIDER_SITE_OTHER): Payer: Medicare Other | Admitting: *Deleted

## 2020-01-05 DIAGNOSIS — Z95810 Presence of automatic (implantable) cardiac defibrillator: Secondary | ICD-10-CM

## 2020-01-05 LAB — CUP PACEART REMOTE DEVICE CHECK
Battery Remaining Longevity: 120 mo
Battery Remaining Percentage: 100 %
Brady Statistic RA Percent Paced: 0 %
Brady Statistic RV Percent Paced: 0 %
Date Time Interrogation Session: 20210720031100
HighPow Impedance: 90 Ohm
Implantable Lead Implant Date: 20171229
Implantable Lead Implant Date: 20171229
Implantable Lead Implant Date: 20171229
Implantable Lead Location: 753858
Implantable Lead Location: 753859
Implantable Lead Location: 753860
Implantable Lead Model: 292
Implantable Lead Model: 4677
Implantable Lead Model: 7740
Implantable Lead Serial Number: 423617
Implantable Lead Serial Number: 604122
Implantable Lead Serial Number: 686437
Implantable Pulse Generator Implant Date: 20171229
Lead Channel Impedance Value: 532 Ohm
Lead Channel Impedance Value: 716 Ohm
Lead Channel Impedance Value: 776 Ohm
Lead Channel Setting Pacing Amplitude: 2 V
Lead Channel Setting Pacing Amplitude: 2.5 V
Lead Channel Setting Pacing Pulse Width: 0.4 ms
Lead Channel Setting Sensing Sensitivity: 0.5 mV
Lead Channel Setting Sensing Sensitivity: 1 mV
Pulse Gen Serial Number: 169900

## 2020-01-07 NOTE — Progress Notes (Signed)
Remote ICD transmission.   

## 2020-01-13 IMAGING — DX DG CHEST 2V
2 series · 2 of 2 positions shown · non-contrast
Comparison: 10/08/2016

CLINICAL DATA: Altered mental status.  Near syncope.

EXAM:
CHEST - 2 VIEW

[chest pa]
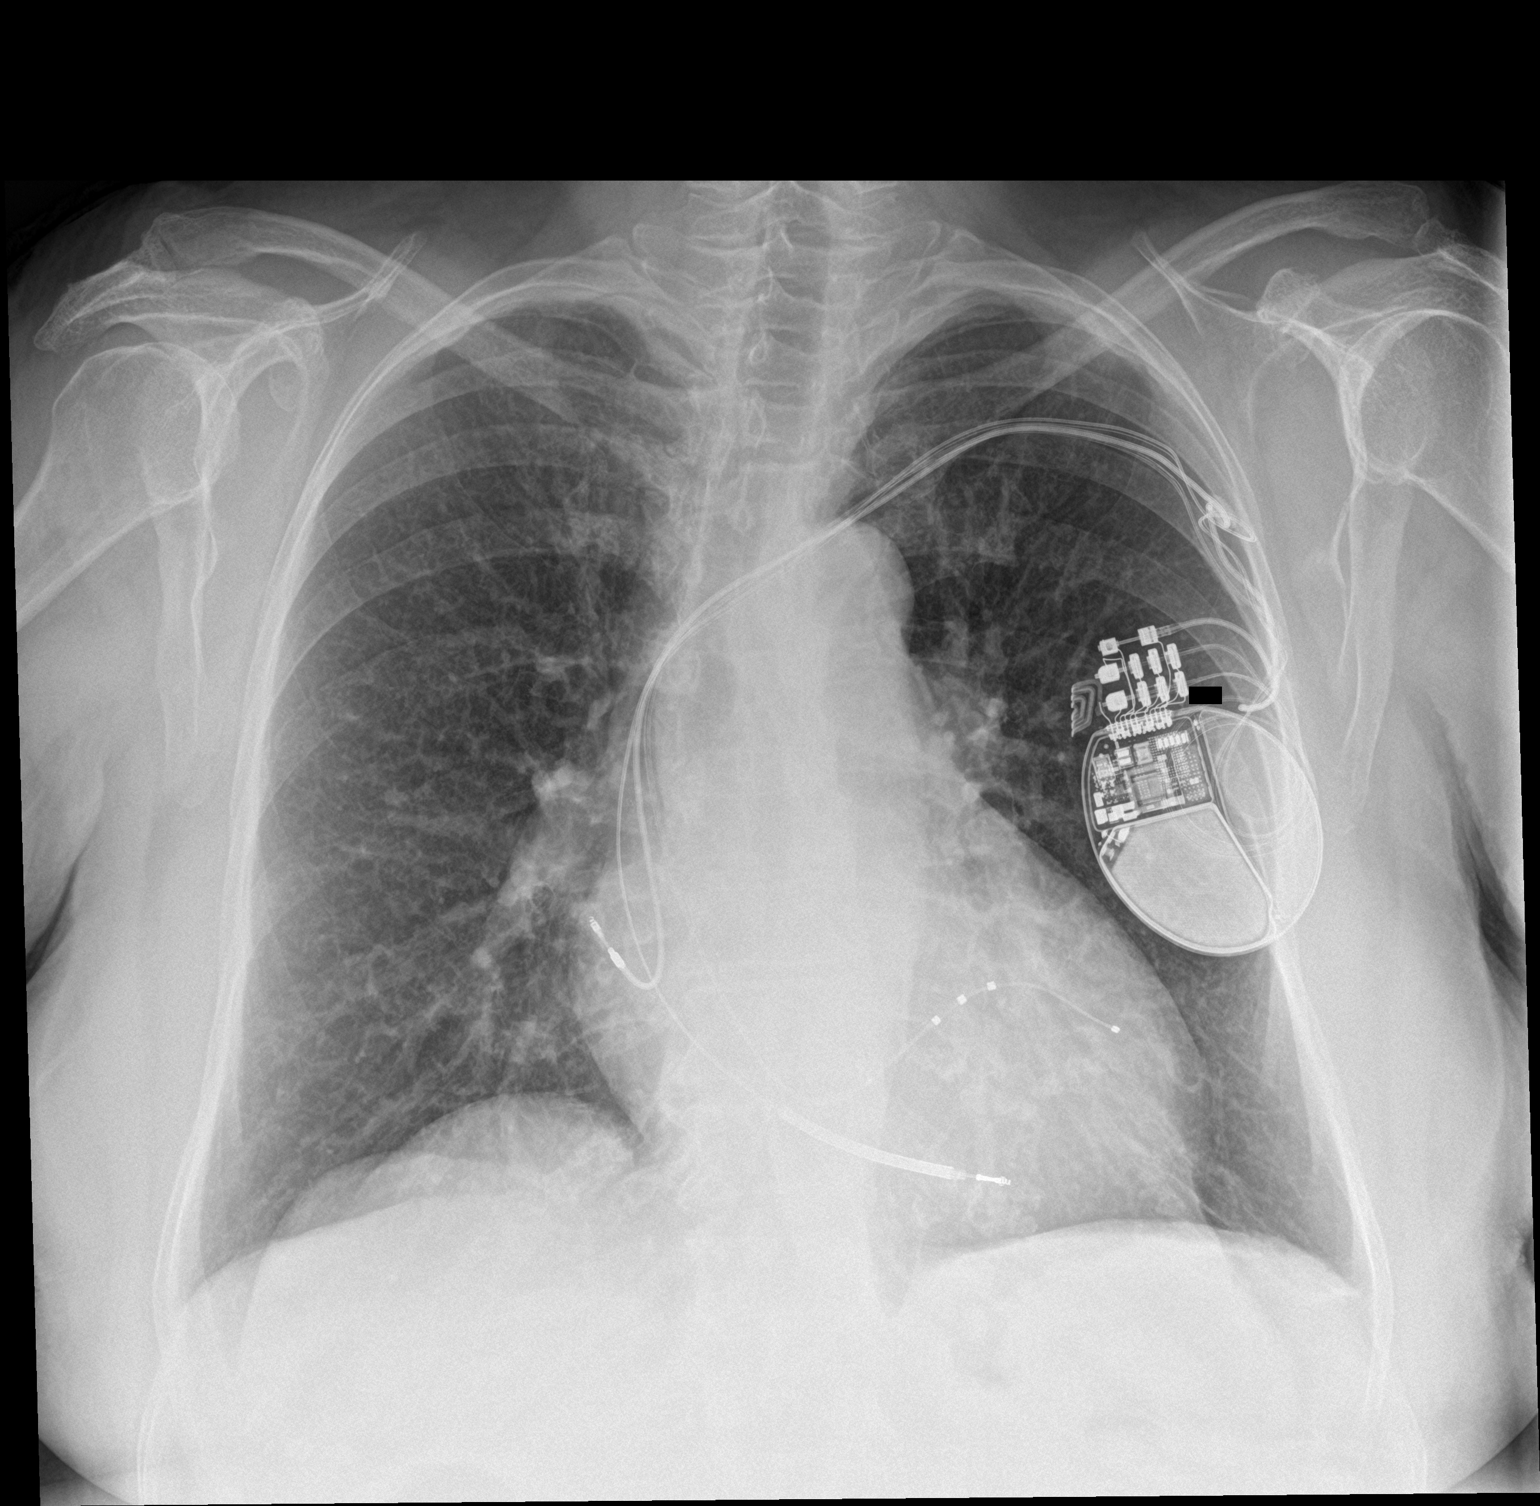

[chest lat]
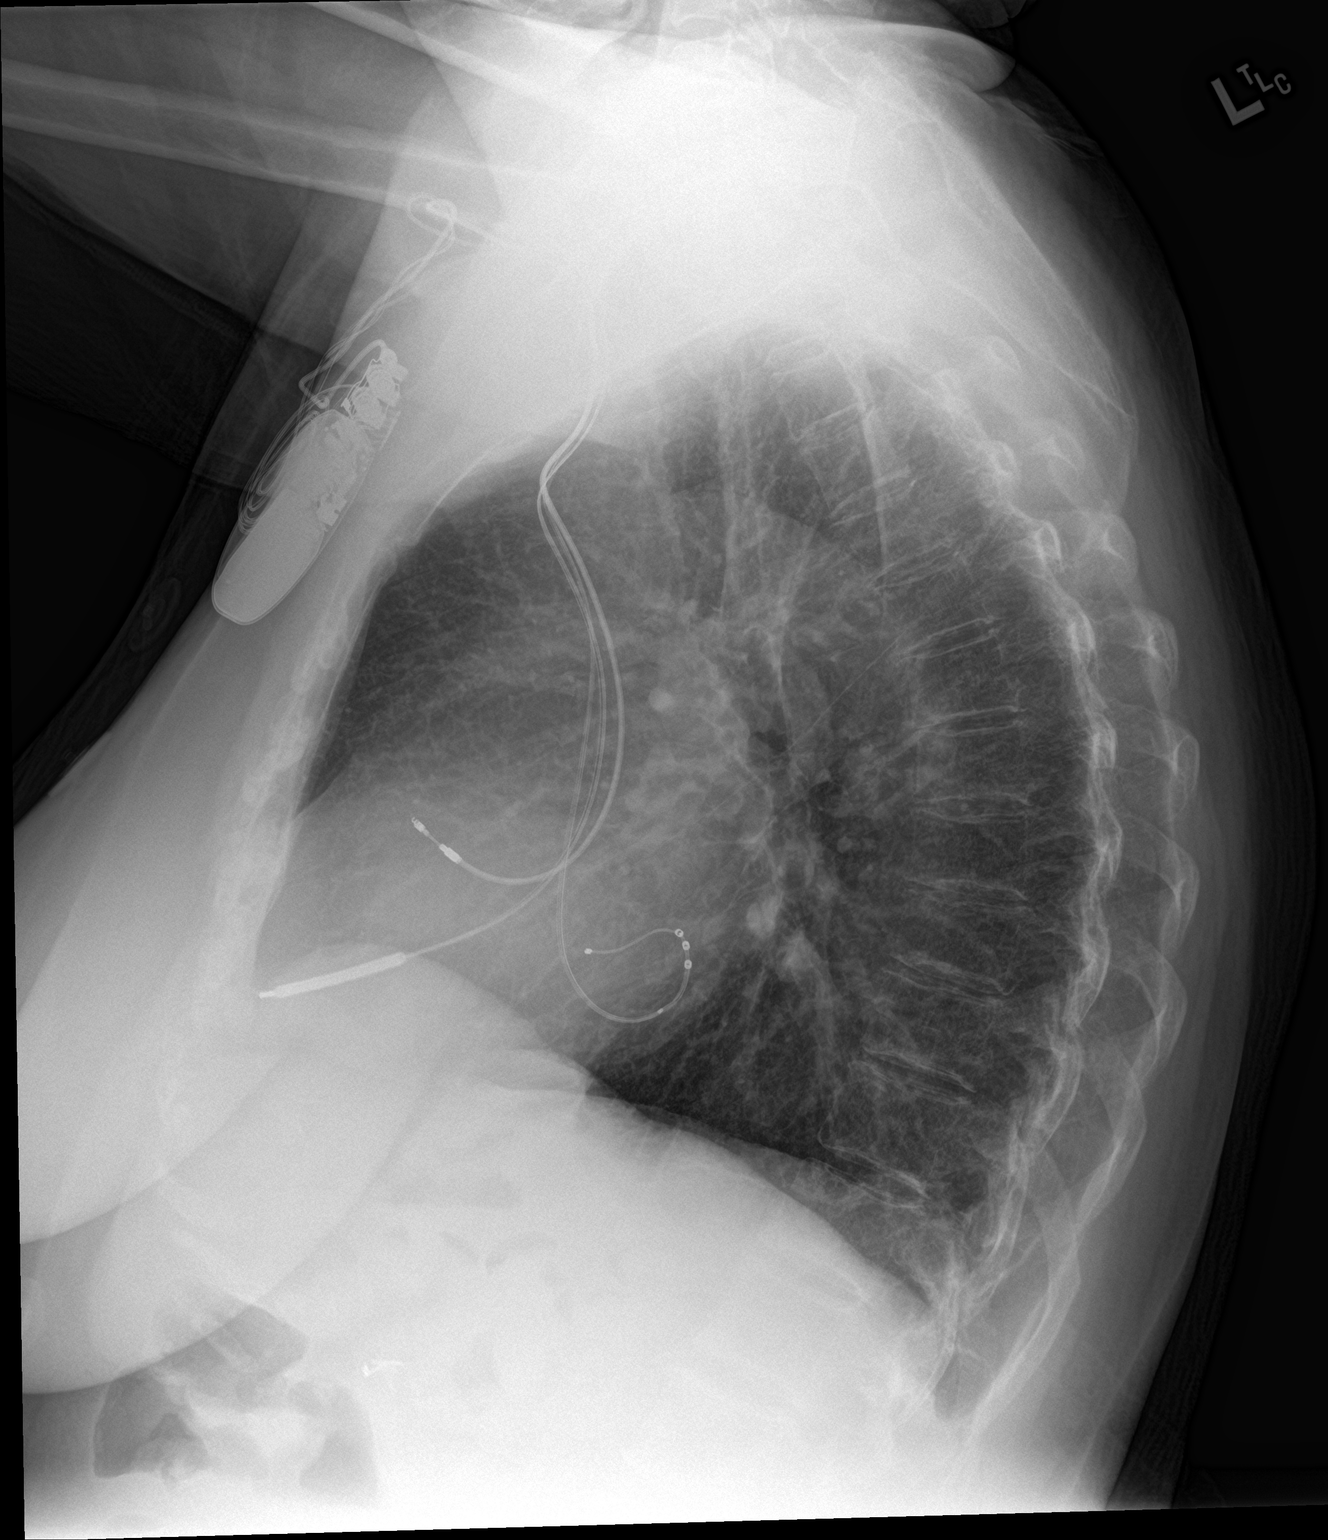

[2 of 2 positions shown; findings below may reference images not displayed]

FINDINGS: Left-sided pacemaker unchanged. Lungs are adequately inflated
without focal airspace consolidation or effusion. Cardiomediastinal
silhouette and remainder of the exam is unchanged.
IMPRESSION: No active cardiopulmonary disease.

## 2020-01-13 IMAGING — CT CT HEAD W/O CM
4 series · 17 of 47 positions shown, 19 images · non-contrast
Comparison: 01/14/2017

CLINICAL DATA: Syncopal episode yesterday morning when getting up
and walking to the restroom, fatigue, shortness of breath, history
colon cancer, CHF, dementia, hypertension, non ischemic
cardiomyopathy.

EXAM:
CT HEAD WITHOUT CONTRAST
TECHNIQUE: Contiguous axial images were obtained from the base of the skull
through the vertex without intravenous contrast. Sagittal and
coronal MPR images reconstructed from axial data set.

[Series 3: head without · axial · non-contrast · 0.41mm/px · z∈[-74,+51]mm · 7 of 35 slices shown, 9 images]
[im 5/35  brain]
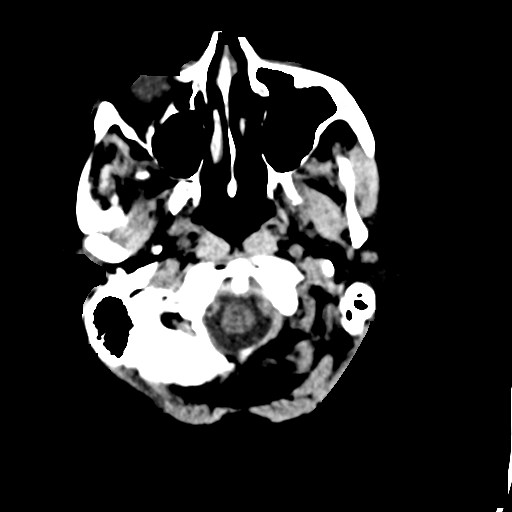
[im 5/35  bone]
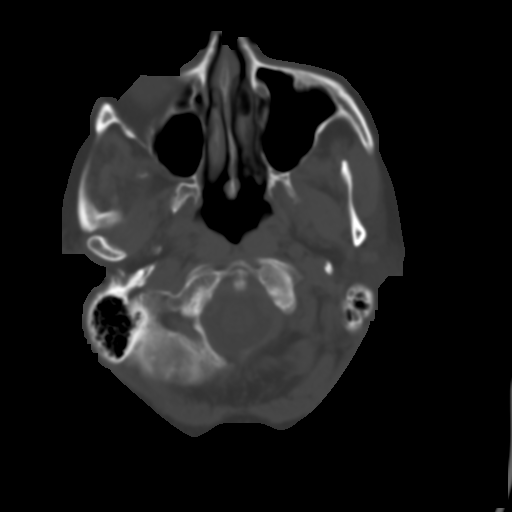
[im 9/35  brain]
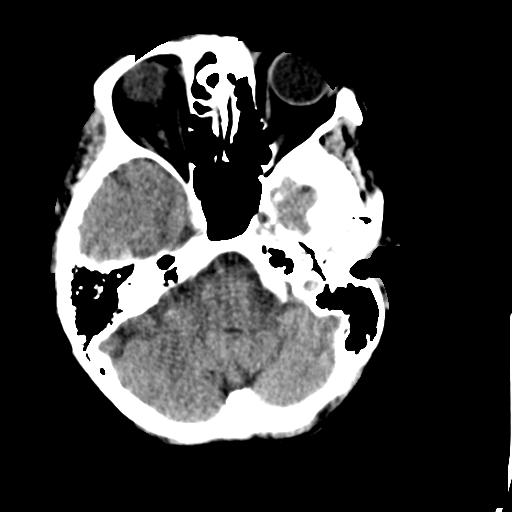
[im 13/35  brain]
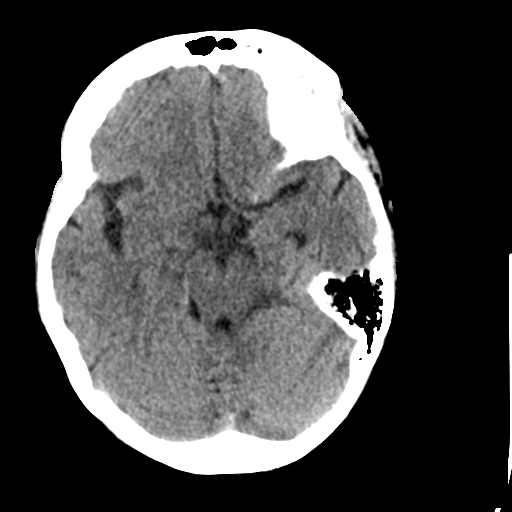
[im 18/35  brain]
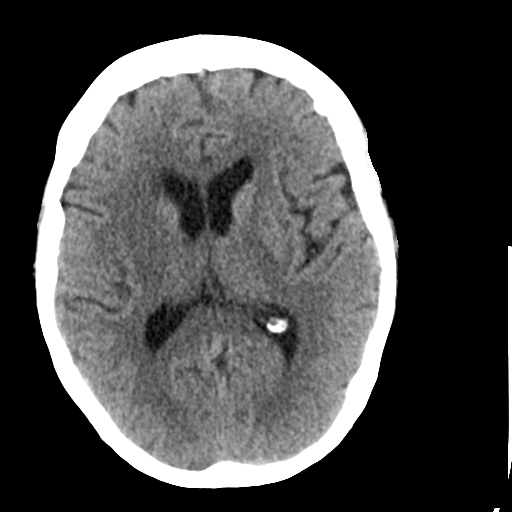
[im 22/35  brain]
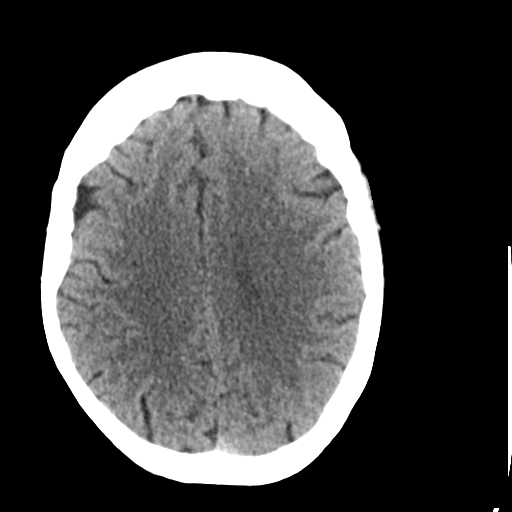
[im 22/35  bone]
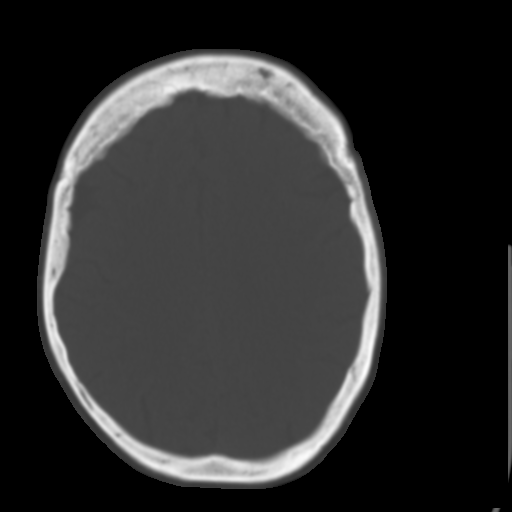
[im 26/35  brain]
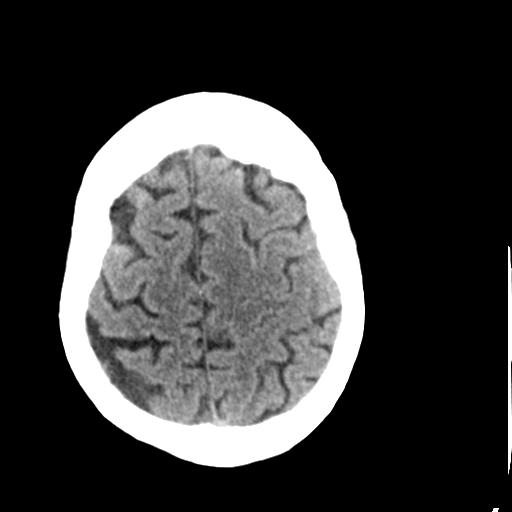
[im 30/35  brain]
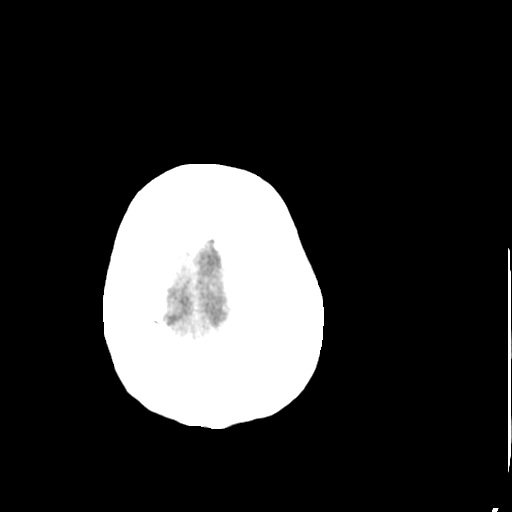

[Series 4: head bone · axial · 0.41mm/px · z∈[-78,-18]mm · 4 of 86 slices shown]
[im 9/86  bone]
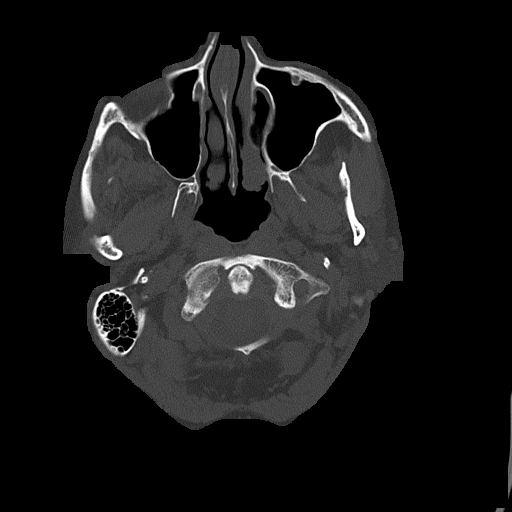
[im 18/86  bone]
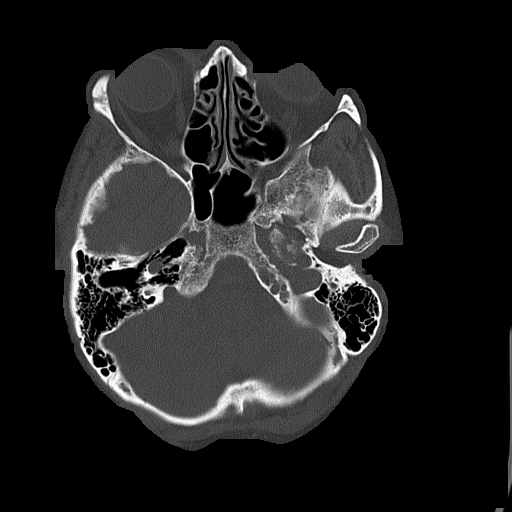
[im 26/86  bone]
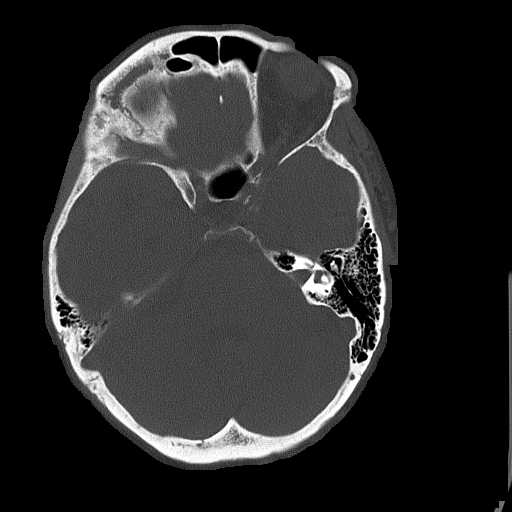
[im 39/86  bone]
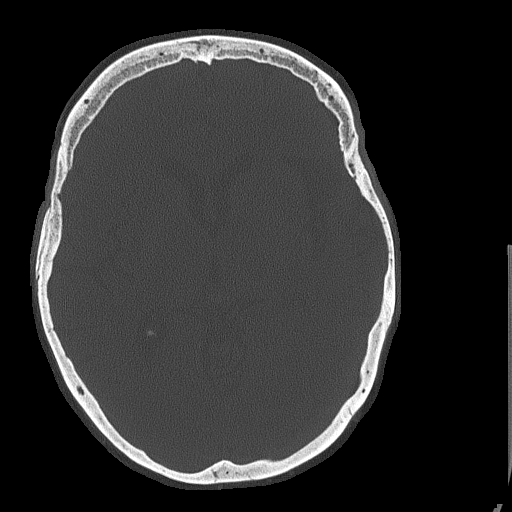

[Series 5: head without cor · coronal · non-contrast · 0.37mm/px · 3 of 67 slices shown]
[im 23/67  brain]
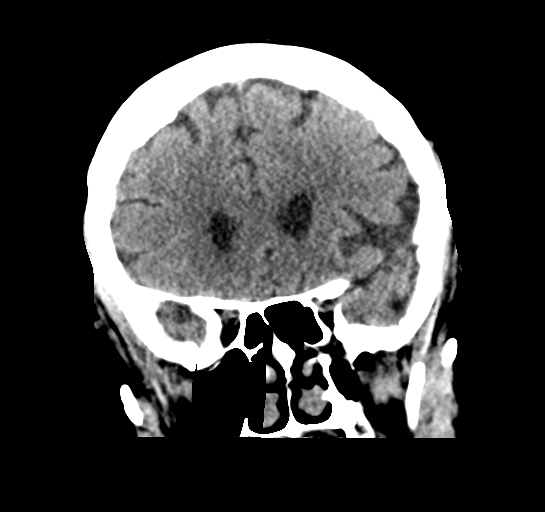
[im 30/67  brain]
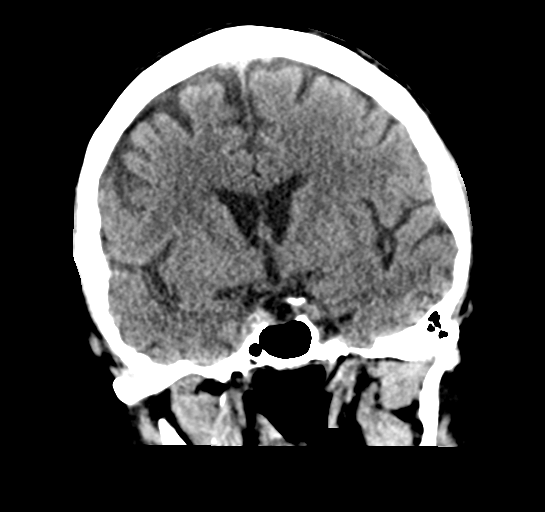
[im 37/67  brain]
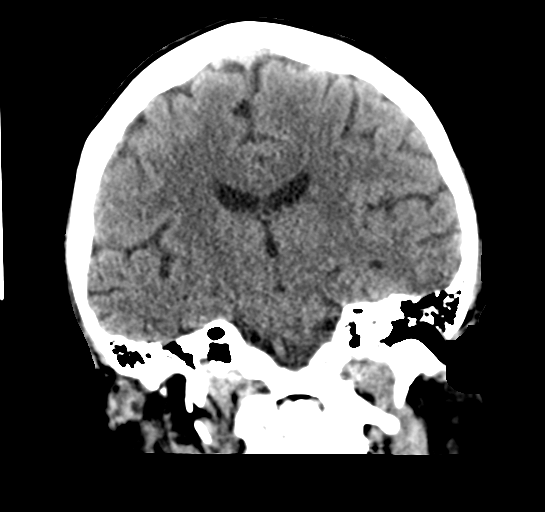

[Series 6: head without sag · sagittal · non-contrast · 0.35mm/px · 3 of 58 slices shown]
[im 20/58  brain]
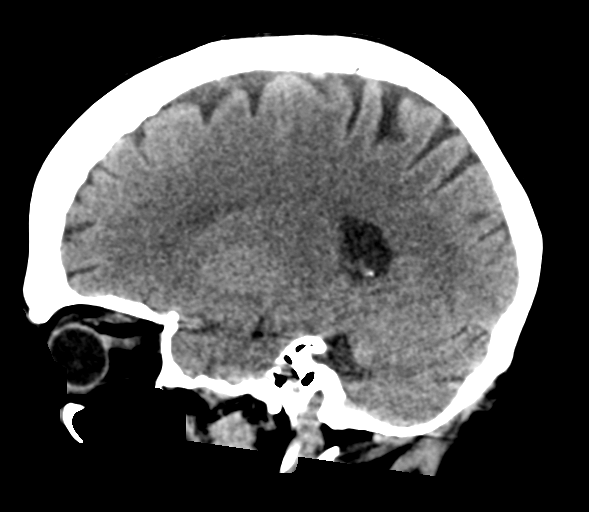
[im 29/58  brain]
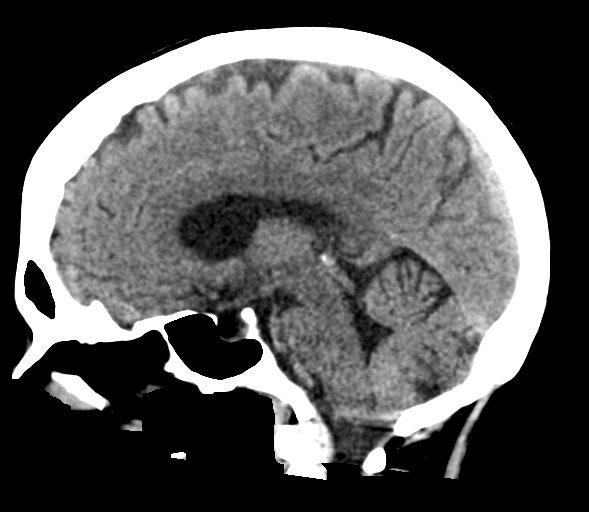
[im 39/58  brain]
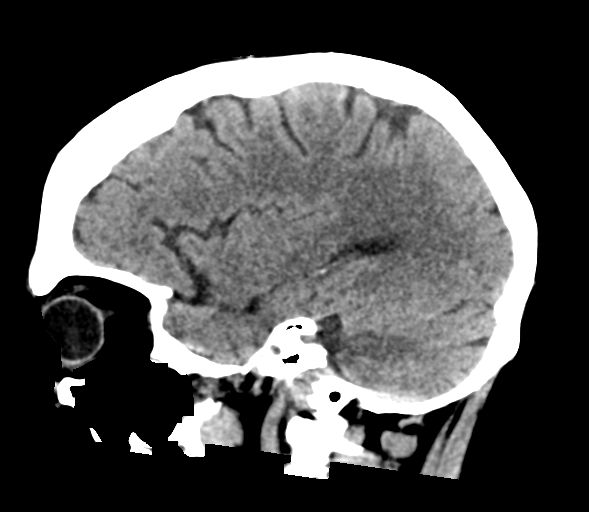

[17 of 47 positions shown; findings below may reference images not displayed]

FINDINGS: Brain: Normal ventricular morphology. No midline shift or mass
effect. Normal appearance of brain parenchyma. No intracranial
hemorrhage, mass lesion, or evidence of acute infarction. No
extra-axial fluid collections.

Vascular: Unremarkable

Skull: Intact

Sinuses/Orbits: Clear

Other: N/A
IMPRESSION: Normal exam.

## 2020-01-16 ENCOUNTER — Other Ambulatory Visit: Payer: Self-pay | Admitting: Adult Health

## 2020-01-19 ENCOUNTER — Encounter: Payer: Self-pay | Admitting: Adult Health

## 2020-01-21 ENCOUNTER — Other Ambulatory Visit: Payer: Self-pay

## 2020-01-21 ENCOUNTER — Ambulatory Visit (INDEPENDENT_AMBULATORY_CARE_PROVIDER_SITE_OTHER): Payer: Medicare Other | Admitting: Adult Health

## 2020-01-21 VITALS — BP 104/61 | HR 82 | Ht 63.0 in | Wt 193.0 lb

## 2020-01-21 DIAGNOSIS — Z9989 Dependence on other enabling machines and devices: Secondary | ICD-10-CM

## 2020-01-21 DIAGNOSIS — G4733 Obstructive sleep apnea (adult) (pediatric): Secondary | ICD-10-CM | POA: Diagnosis not present

## 2020-01-21 DIAGNOSIS — R413 Other amnesia: Secondary | ICD-10-CM

## 2020-01-21 NOTE — Patient Instructions (Signed)
Continue using CPAP nightly and greater than 4 hours each night Memory score slightly decreased-- continue Namenda If your symptoms worsen or you develop new symptoms please let us know.

## 2020-01-21 NOTE — Progress Notes (Signed)
PATIENT: Samantha [name] DOB: [birth year]  REASON FOR VISIT: follow up HISTORY FROM: patient  HISTORY OF PRESENT ILLNESS: Today 01/21/20:  Samantha [name] is a 79 year old female with a history of obstructive sleep apnea on CPAP.  Her download indicates that she used her machine 26 out of 30 days for compliance of 87%.  She used her machine greater than 4 hours 18 days for compliance of 60%.  On average she uses her machine 7 hours and 5 minutes.  Her residual AHI is 10.8 on 11 cm of water with EPR of 1.  Leak in the 95th percentile is 67.5 L/min.  She currently is living in independent living.  Reports that her memory has remained stable.  She is able to complete all ADLs independently.  Denies any trouble sleeping.  She is able to manage her own medications.  Her daughter helps her with her finances.  HISTORY Samantha [name] is a 79 year old female with a history of memory disturbance, restless leg and obstructive sleep apnea on CPAP.  She returns today for follow-up with her daughter.  Her CPAP download indicates that she used her machine 61 out of 90 days for compliance of 68%.  She use her machine 46 and 90 days for compliance of 51%.  On average she uses her machine 6 hours and 53 minutes.  Her residual AHI is 8 on 11 cm of water with EPR of 1.  Her leak in the 95th percentile is 27.6 L/min.  Daughter reports that she did not use it in the month of January patient was diagnosed with a UTI.  In regards to her memory her daughter report reports that her memory may be slightly worse.  She now lives at [facility].  She no longer cooks she uses their dining hall.  Her daughter manages her finances.  She denies any trouble sleeping.  Daughter reports that she may get a little agitated if she gets confused.  Denies hallucinations.  Restless leg seems to be controlled with Requip.  Patient has no complaints today.  REVIEW OF SYSTEMS: Out of a complete 14 system review of symptoms, the patient  complains only of the following symptoms, and all other reviewed systems are negative.  FSS 11 ESS  1  ALLERGIES: Allergies  Allergen Reactions  . Codeine     Upset stomach    . Iohexol Hives     Desc: Pt. states she broke out in hives 30 yrs ago w/ a CT Brain scan.  She has since had a heart cath and this CT today (08/10/05) w/o premeds and did well. No evident reaction.  . Shellfish Allergy Itching    Past history of itching with shrimp, has a history of allergy shots , no problem with seafood in years per patient (??)    HOME MEDICATIONS: Outpatient Medications Prior to Visit  Medication Sig Dispense Refill  . carvedilol (COREG) 12.5 MG tablet TAKE 1 TABLET (12.5 MG TOTAL) BY MOUTH 2 (TWO) TIMES A DAY. 360 tablet 0  . Cholecalciferol (VITAMIN D3) 2000 units TABS Take 2,000 Units by mouth daily.    . citalopram (CELEXA) 20 MG tablet TAKE 1 TABLET BY MOUTH EVERY DAY 90 tablet 3  . furosemide (LASIX) 20 MG tablet Take 1 tablet (20 mg total) by mouth daily. 90 tablet 3  . loratadine (CLARITIN) 10 MG tablet Take 10 mg by mouth daily.    Marland Kitchen losartan (COZAAR) 25 MG tablet Take 25 mg by mouth daily.    Marland Kitchen  memantine (NAMENDA) 10 MG tablet TAKE 1 TABLET BY MOUTH TWICE A DAY 180 tablet 1  . rOPINIRole (REQUIP) 0.5 MG tablet TAKE 1 TABLET (0.5 MG TOTAL) BY MOUTH AT BEDTIME. 90 tablet 3   No facility-administered medications prior to visit.    PAST MEDICAL HISTORY: Past Medical History:  Diagnosis Date  . Anemia   . 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 Cancer of colon ([place]) 08/2005   STAGE 1 CHEMO AND RADIATION  . Chronic systolic CHF (congestive heart failure) ([place])   . Colitis   . Dementia ([place])   . Febrile neutropenia ([place])   . Hypertension   . Hypokalemia   . LBBB (left bundle branch block)   . Movement disorder   . Mucositis   . Nonischemic cardiomyopathy ([place])    a. prior varying EF, 30% in 2017. s/p BSX CRTD 05/2016.  Marland Kitchen OSA  (obstructive sleep apnea)   . 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 S/P cardiac cath 2000   Normal coronaries  . Sleep apnea    uses CPAP nightly    PAST SURGICAL HISTORY: Past Surgical History:  Procedure Laterality Date  . APPENDECTOMY    . CARDIAC CATHETERIZATION    . CARPAL TUNNEL RELEASE Left 09/01/2015   Procedure: LEFT CARPAL TUNNEL RELEASE;  Surgeon: Leanora Cover, MD;  Location: [facility];  Service: Orthopedics;  Laterality: Left;  . cataract surgery removal     bilateral  . CESAREAN SECTION    . CHOLECYSTECTOMY    . EP IMPLANTABLE DEVICE N/A 06/15/2016   Procedure: BiV ICD Insertion CRT-D;  Surgeon: Evans Lance, MD;  Location: [facility];  Service: Cardiovascular;  Laterality: N/A;  . HEMORROIDECTOMY    . OPEN REDUCTION INTERNAL FIXATION (ORIF) DISTAL RADIAL FRACTURE Left 04/28/2015   Procedure: OPEN REDUCTION INTERNAL FIXATION (ORIF) LEFT DISTAL RADIUS FRACTURE;  Surgeon: Leanora Cover, MD;  Location: [facility];  Service: Orthopedics;  Laterality: Left;  . PORT-A-CATH REMOVAL    . TONSILLECTOMY    . TUBAL LIGATION    . US ECHOCARDIOGRAPHY  3/12   EF 30 to 35%    FAMILY HISTORY: Family History  Problem Relation Age of Onset  . Diabetes Sister   . Cancer Brother        PROSTATE    SOCIAL HISTORY: Social History   Socioeconomic History  . Marital status: Divorced    Spouse name: Not on file  . Number of children: 2  . Years of education: 72  . Highest education level: Not on file  Occupational History    Employer: RETIRED  . Occupation: Retired  Tobacco Use  . Smoking status: Never Smoker  . Smokeless tobacco: Never Used  Vaping Use  . Vaping Use: Never used  Substance and Sexual Activity  . Alcohol use: No    Alcohol/week: 0.0 standard drinks  . Drug use: No  . Sexual activity: Never    Comment: 1st intercourse 79 yo-More than 5 partners  Other Topics Concern  . Not on file  Social History Narrative   Patient  lives at home alone.    Patient has 2 children.    Patient is retired.    Patient has a high school education.    Patient is divorced.    Patient is right handed.    Social Determinants of Health   Financial Resource Strain:   . Difficulty of Paying Living Expenses:   Food Insecurity:   .  Worried About Charity fundraiser in the Last Year:   . Arboriculturist in the Last Year:   Transportation Needs:   . Film/video editor (Medical):   Marland Kitchen Lack of Transportation (Non-Medical):   Physical Activity:   . Days of Exercise per Week:   . Minutes of Exercise per Session:   Stress:   . Feeling of Stress :   Social Connections:   . Frequency of Communication with Friends and Family:   . Frequency of Social Gatherings with Friends and Family:   . Attends Religious Services:   . Active Member of Clubs or Organizations:   . Attends Archivist Meetings:   Marland Kitchen Marital Status:   Intimate Partner Violence:   . Fear of Current or Ex-Partner:   . Emotionally Abused:   Marland Kitchen Physically Abused:   . Sexually Abused:       PHYSICAL EXAM  Vitals:   01/21/20 1332  BP: 104/61  Pulse: 82  Weight: 193 lb (87.5 kg)  Height: 5\' 3"  (1.6 m)   Body mass index is 34.19 kg/m.   MMSE - Mini Mental State Exam 01/21/2020 07/22/2019 12/25/2018  Not completed: - - -  Orientation to time 1 4 3   Orientation to Place 0 4 4  Registration 3 3 3   Attention/ Calculation 5 1 5   Attention/Calculation-comments - - -  Recall 0 3 1  Language- name 2 objects 2 2 2   Language- repeat 0 1 1  Language- follow 3 step command 3 3 3   Language- read & follow direction 1 1 1   Write a sentence 1 1 1   Copy design 1 1 1   Copy design-comments - - 10 animals  Total score 17 24 25      Generalized: Well developed, in no acute distress  Chest: Lungs clear to auscultation bilaterally  Neurological examination  Mentation: Alert oriented to time, place, history taking. Follows all commands speech and language  fluent Cranial nerve II-XII: Extraocular movements were full, visual field were full on confrontational test Head turning and shoulder shrug  were normal and symmetric. Motor: The motor testing reveals 5 over 5 strength of all 4 extremities. Good symmetric motor tone is noted throughout.  Sensory: Sensory testing is intact to soft touch on all 4 extremities. No evidence of extinction is noted.  Gait and station: Gait is normal.    DIAGNOSTIC DATA (LABS, IMAGING, TESTING) - I reviewed patient records, labs, notes, testing and imaging myself where available.  Lab Results  Component Value Date   WBC 7.6 07/06/2019   HGB 12.7 07/06/2019   HCT 39.7 07/06/2019   MCV 100.5 (H) 07/06/2019   PLT 225 07/06/2019      Component Value Date/Time   NA 143 07/06/2019 1925   NA 141 10/03/2016 1355   NA 141 10/01/2011 1302   K 3.6 07/06/2019 1925   K 4.3 10/01/2011 1302   CL 109 07/06/2019 1925   CL 100 10/01/2011 1302   CO2 23 07/06/2019 1925   CO2 30 10/01/2011 1302   GLUCOSE 138 (H) 07/06/2019 1925   GLUCOSE 93 10/01/2011 1302   BUN 20 07/06/2019 1925   BUN 17 10/03/2016 1355   BUN 18 10/01/2011 1302   CREATININE 1.02 (H) 07/06/2019 1925   CREATININE 1.17 (H) 06/05/2016 1549   CALCIUM 8.9 07/06/2019 1925   CALCIUM 8.8 10/01/2011 1302   PROT 7.2 07/06/2019 1925   PROT 6.8 11/25/2014 1427   PROT 7.2 10/01/2011 1302  ALBUMIN 3.7 07/06/2019 1925   ALBUMIN 4.5 11/25/2014 1427   AST 31 07/06/2019 1925   AST 22 10/01/2011 1302   ALT 49 (H) 07/06/2019 1925   ALT 23 10/01/2011 1302   ALKPHOS 51 07/06/2019 1925   ALKPHOS 69 10/01/2011 1302   BILITOT 0.7 07/06/2019 1925   BILITOT 0.3 11/25/2014 1427   BILITOT 0.40 10/01/2011 1302   GFRNONAA 53 (L) 07/06/2019 1925   GFRAA >60 07/06/2019 1925   Lab Results  Component Value Date   CHOL 185 03/10/2013   HDL 57.30 03/10/2013   LDLCALC 110 (H) 03/10/2013   TRIG 91.0 03/10/2013   CHOLHDL 3 03/10/2013   No results found for: HGBA1C Lab  Results  Component Value Date   VITAMINB12 334 10/15/2017   Lab Results  Component Value Date   TSH 1.580 10/15/2017      ASSESSMENT AND PLAN 79 y.o. 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 here with:  1. OSA on CPAP   CPAP compliance excellent  Good treatment of AHI   Encourage patient to use CPAP nightly and > 4 hours each night   2.  Memory disturbance   MMSE 17/30 previously 24/30  Continue Namenda  Advised if symptoms worsen or she develops new symptoms she should let us know Follow-up in 1 year or sooner if needed   I spent 30 minutes of face-to-face and non-face-to-face time with patient.  This included previsit chart review, lab review, study review, order entry, electronic health record documentation, patient education.  Ward Givens, MSN, NP-C 01/21/2020, 2:04 PM [facility] [address], [place], [place] [address] [phone]

## 2020-03-27 ENCOUNTER — Other Ambulatory Visit: Payer: Self-pay | Admitting: Adult Health

## 2020-03-27 ENCOUNTER — Other Ambulatory Visit: Payer: Self-pay | Admitting: Nurse Practitioner

## 2020-03-27 DIAGNOSIS — I5023 Acute on chronic systolic (congestive) heart failure: Secondary | ICD-10-CM

## 2020-03-29 ENCOUNTER — Emergency Department (HOSPITAL_COMMUNITY): Payer: Medicare Other

## 2020-03-29 ENCOUNTER — Encounter (HOSPITAL_COMMUNITY): Payer: Self-pay | Admitting: Emergency Medicine

## 2020-03-29 ENCOUNTER — Inpatient Hospital Stay (HOSPITAL_COMMUNITY)
Admission: EM | Admit: 2020-03-29 | Discharge: 2020-04-06 | DRG: 871 | Disposition: A | Discharge status: Skilled Nursing Facility | Payer: Medicare Other | Attending: Internal Medicine | Admitting: Internal Medicine

## 2020-03-29 ENCOUNTER — Other Ambulatory Visit: Payer: Self-pay

## 2020-03-29 DIAGNOSIS — Z66 Do not resuscitate: Secondary | ICD-10-CM | POA: Diagnosis present

## 2020-03-29 DIAGNOSIS — Z20822 Contact with and (suspected) exposure to covid-19: Secondary | ICD-10-CM | POA: Diagnosis present

## 2020-03-29 DIAGNOSIS — Z885 Allergy status to narcotic agent status: Secondary | ICD-10-CM

## 2020-03-29 DIAGNOSIS — E870 Hyperosmolality and hypernatremia: Secondary | ICD-10-CM | POA: Diagnosis present

## 2020-03-29 DIAGNOSIS — E861 Hypovolemia: Secondary | ICD-10-CM | POA: Diagnosis present

## 2020-03-29 DIAGNOSIS — I428 Other cardiomyopathies: Secondary | ICD-10-CM | POA: Diagnosis present

## 2020-03-29 DIAGNOSIS — I5022 Chronic systolic (congestive) heart failure: Secondary | ICD-10-CM | POA: Diagnosis present

## 2020-03-29 DIAGNOSIS — Z91041 Radiographic dye allergy status: Secondary | ICD-10-CM

## 2020-03-29 DIAGNOSIS — N390 Urinary tract infection, site not specified: Secondary | ICD-10-CM | POA: Diagnosis present

## 2020-03-29 DIAGNOSIS — F419 Anxiety disorder, unspecified: Secondary | ICD-10-CM | POA: Diagnosis present

## 2020-03-29 DIAGNOSIS — A4151 Sepsis due to Escherichia coli [E. coli]: Principal | ICD-10-CM | POA: Diagnosis present

## 2020-03-29 DIAGNOSIS — A419 Sepsis, unspecified organism: Secondary | ICD-10-CM | POA: Diagnosis not present

## 2020-03-29 DIAGNOSIS — F0150 Vascular dementia without behavioral disturbance: Secondary | ICD-10-CM | POA: Diagnosis present

## 2020-03-29 DIAGNOSIS — I130 Hypertensive heart and chronic kidney disease with heart failure and stage 1 through stage 4 chronic kidney disease, or unspecified chronic kidney disease: Secondary | ICD-10-CM | POA: Diagnosis present

## 2020-03-29 DIAGNOSIS — E538 Deficiency of other specified B group vitamins: Secondary | ICD-10-CM | POA: Diagnosis present

## 2020-03-29 DIAGNOSIS — I10 Essential (primary) hypertension: Secondary | ICD-10-CM | POA: Diagnosis present

## 2020-03-29 DIAGNOSIS — E669 Obesity, unspecified: Secondary | ICD-10-CM | POA: Diagnosis present

## 2020-03-29 DIAGNOSIS — E860 Dehydration: Secondary | ICD-10-CM | POA: Diagnosis present

## 2020-03-29 DIAGNOSIS — Z6834 Body mass index (BMI) 34.0-34.9, adult: Secondary | ICD-10-CM

## 2020-03-29 DIAGNOSIS — Z8042 Family history of malignant neoplasm of prostate: Secondary | ICD-10-CM

## 2020-03-29 DIAGNOSIS — G9341 Metabolic encephalopathy: Secondary | ICD-10-CM | POA: Diagnosis present

## 2020-03-29 DIAGNOSIS — R6520 Severe sepsis without septic shock: Secondary | ICD-10-CM | POA: Diagnosis present

## 2020-03-29 DIAGNOSIS — N179 Acute kidney failure, unspecified: Secondary | ICD-10-CM

## 2020-03-29 DIAGNOSIS — Z833 Family history of diabetes mellitus: Secondary | ICD-10-CM

## 2020-03-29 DIAGNOSIS — N1831 Chronic kidney disease, stage 3a: Secondary | ICD-10-CM | POA: Diagnosis present

## 2020-03-29 DIAGNOSIS — G4733 Obstructive sleep apnea (adult) (pediatric): Secondary | ICD-10-CM | POA: Diagnosis present

## 2020-03-29 DIAGNOSIS — F32A Depression, unspecified: Secondary | ICD-10-CM | POA: Diagnosis present

## 2020-03-29 DIAGNOSIS — R4182 Altered mental status, unspecified: Secondary | ICD-10-CM | POA: Diagnosis not present

## 2020-03-29 DIAGNOSIS — G2581 Restless legs syndrome: Secondary | ICD-10-CM | POA: Diagnosis present

## 2020-03-29 DIAGNOSIS — R7401 Elevation of levels of liver transaminase levels: Secondary | ICD-10-CM

## 2020-03-29 DIAGNOSIS — I447 Left bundle-branch block, unspecified: Secondary | ICD-10-CM | POA: Diagnosis present

## 2020-03-29 DIAGNOSIS — S2020XA Contusion of thorax, unspecified, initial encounter: Secondary | ICD-10-CM | POA: Diagnosis present

## 2020-03-29 DIAGNOSIS — Z85048 Personal history of other malignant neoplasm of rectum, rectosigmoid junction, and anus: Secondary | ICD-10-CM

## 2020-03-29 DIAGNOSIS — Z79899 Other long term (current) drug therapy: Secondary | ICD-10-CM

## 2020-03-29 DIAGNOSIS — Z95810 Presence of automatic (implantable) cardiac defibrillator: Secondary | ICD-10-CM | POA: Diagnosis present

## 2020-03-29 DIAGNOSIS — Z9989 Dependence on other enabling machines and devices: Secondary | ICD-10-CM

## 2020-03-29 DIAGNOSIS — Z91013 Allergy to seafood: Secondary | ICD-10-CM

## 2020-03-29 LAB — CBC WITH DIFFERENTIAL/PLATELET
Abs Immature Granulocytes: 0.09 10*3/uL — ABNORMAL HIGH (ref 0.00–0.07)
Basophils Absolute: 0 10*3/uL (ref 0.0–0.1)
Basophils Relative: 0 %
Eosinophils Absolute: 0 10*3/uL (ref 0.0–0.5)
Eosinophils Relative: 0 %
HCT: 49.3 % — ABNORMAL HIGH (ref 36.0–46.0)
Hemoglobin: 14.7 g/dL (ref 12.0–15.0)
Immature Granulocytes: 1 %
Lymphocytes Relative: 7 %
Lymphs Abs: 1.1 10*3/uL (ref 0.7–4.0)
MCH: 31.1 pg (ref 26.0–34.0)
MCHC: 29.8 g/dL — ABNORMAL LOW (ref 30.0–36.0)
MCV: 104.4 fL — ABNORMAL HIGH (ref 80.0–100.0)
Monocytes Absolute: 0.7 10*3/uL (ref 0.1–1.0)
Monocytes Relative: 5 %
Neutro Abs: 13.2 10*3/uL — ABNORMAL HIGH (ref 1.7–7.7)
Neutrophils Relative %: 87 %
Platelets: 187 10*3/uL (ref 150–400)
RBC: 4.72 MIL/uL (ref 3.87–5.11)
RDW: 13.4 % (ref 11.5–15.5)
WBC: 15.1 10*3/uL — ABNORMAL HIGH (ref 4.0–10.5)
nRBC: 0.1 % (ref 0.0–0.2)

## 2020-03-29 LAB — COMPREHENSIVE METABOLIC PANEL
ALT: 96 U/L — ABNORMAL HIGH (ref 0–44)
AST: 98 U/L — ABNORMAL HIGH (ref 15–41)
Albumin: 3.8 g/dL (ref 3.5–5.0)
Alkaline Phosphatase: 43 U/L (ref 38–126)
Anion gap: 17 — ABNORMAL HIGH (ref 5–15)
BUN: 60 mg/dL — ABNORMAL HIGH (ref 8–23)
CO2: 23 mmol/L (ref 22–32)
Calcium: 9 mg/dL (ref 8.9–10.3)
Chloride: 121 mmol/L — ABNORMAL HIGH (ref 98–111)
Creatinine, Ser: 2.88 mg/dL — ABNORMAL HIGH (ref 0.44–1.00)
GFR, Estimated: 15 mL/min — ABNORMAL LOW (ref >60)
Glucose, Bld: 179 mg/dL — ABNORMAL HIGH (ref 70–99)
Potassium: 3.9 mmol/L (ref 3.5–5.1)
Sodium: 161 mmol/L (ref 135–145)
Total Bilirubin: 1.2 mg/dL (ref 0.3–1.2)
Total Protein: 7.7 g/dL (ref 6.5–8.1)

## 2020-03-29 LAB — URINALYSIS, ROUTINE W REFLEX MICROSCOPIC
Bilirubin Urine: NEGATIVE
Glucose, UA: NEGATIVE mg/dL
Ketones, ur: 5 mg/dL — AB
Nitrite: NEGATIVE
Protein, ur: 100 mg/dL — AB
Specific Gravity, Urine: 1.021 (ref 1.005–1.030)
WBC, UA: >50 WBC/hpf — ABNORMAL HIGH (ref 0–5)
pH: 5 (ref 5.0–8.0)

## 2020-03-29 LAB — LACTIC ACID, PLASMA
Lactic Acid, Venous: 2.9 mmol/L (ref 0.5–1.9)
Lactic Acid, Venous: 3.2 mmol/L (ref 0.5–1.9)
Lactic Acid, Venous: 2.8 mmol/L (ref 0.5–1.9)

## 2020-03-29 LAB — APTT: aPTT: 25 seconds (ref 24–36)

## 2020-03-29 LAB — RESPIRATORY PANEL BY RT PCR (FLU A&B, COVID)
Influenza A by PCR: NEGATIVE
Influenza B by PCR: NEGATIVE
SARS Coronavirus 2 by RT PCR: NEGATIVE

## 2020-03-29 LAB — PROTIME-INR
INR: 1.3 — ABNORMAL HIGH (ref 0.8–1.2)
Prothrombin Time: 15.6 seconds — ABNORMAL HIGH (ref 11.4–15.2)

## 2020-03-29 MED ORDER — VANCOMYCIN HCL IN DEXTROSE 1-5 GM/200ML-% IV SOLN: 1000.0000 mg | Freq: Once | INTRAVENOUS | Status: DC

## 2020-03-29 MED ORDER — ROPINIROLE HCL 0.5 MG PO TABS
0.5000 mg | ORAL_TABLET | Freq: Every day | ORAL | Status: DC
  Filled 2020-03-29: qty 1

## 2020-03-29 MED ORDER — CITALOPRAM HYDROBROMIDE 10 MG PO TABS: 20.0000 mg | ORAL_TABLET | Freq: Every day | ORAL | Status: DC

## 2020-03-29 MED ORDER — MEMANTINE HCL 10 MG PO TABS
10.0000 mg | ORAL_TABLET | Freq: Two times a day (BID) | ORAL | Status: DC
  Filled 2020-03-29 (×2): qty 1

## 2020-03-29 MED ORDER — METRONIDAZOLE IN NACL 5-0.79 MG/ML-% IV SOLN
500.0000 mg | Freq: Once | INTRAVENOUS | Status: AC
  Administered 2020-03-29: 500 mg via INTRAVENOUS
  Filled 2020-03-29: qty 100

## 2020-03-29 MED ORDER — MEMANTINE HCL 10 MG PO TABS: 10.0000 mg | ORAL_TABLET | Freq: Every day | ORAL | Status: DC

## 2020-03-29 MED ORDER — LACTATED RINGERS IV BOLUS
250.0000 mL | Freq: Once | INTRAVENOUS | Status: AC
  Administered 2020-03-29: 250 mL via INTRAVENOUS

## 2020-03-29 MED ORDER — VANCOMYCIN HCL 1750 MG/350ML IV SOLN
1750.0000 mg | Freq: Once | INTRAVENOUS | Status: AC
  Administered 2020-03-29: 1750 mg via INTRAVENOUS
  Filled 2020-03-29: qty 350

## 2020-03-29 MED ORDER — CITALOPRAM HYDROBROMIDE 10 MG PO TABS: 10.0000 mg | ORAL_TABLET | Freq: Every day | ORAL | Status: DC

## 2020-03-29 MED ORDER — CARVEDILOL 3.125 MG PO TABS
12.5000 mg | ORAL_TABLET | Freq: Two times a day (BID) | ORAL | Status: DC
  Administered 2020-03-29: 12.5 mg via ORAL
  Filled 2020-03-29 (×2): qty 4

## 2020-03-29 MED ORDER — SODIUM CHLORIDE 0.45 % IV SOLN: INTRAVENOUS | Status: DC

## 2020-03-29 MED ORDER — VANCOMYCIN VARIABLE DOSE PER UNSTABLE RENAL FUNCTION (PHARMACIST DOSING): Status: DC

## 2020-03-29 MED ORDER — LACTATED RINGERS IV SOLN: INTRAVENOUS | Status: DC

## 2020-03-29 MED ORDER — LOSARTAN POTASSIUM 50 MG PO TABS: 25.0000 mg | ORAL_TABLET | Freq: Every day | ORAL | Status: DC

## 2020-03-29 MED ORDER — LACTATED RINGERS IV BOLUS (SEPSIS)
1500.0000 mL | Freq: Once | INTRAVENOUS | Status: AC
  Administered 2020-03-29: 1000 mL via INTRAVENOUS

## 2020-03-29 MED ORDER — SODIUM CHLORIDE 0.9 % IV SOLN
2.0000 g | INTRAVENOUS | Status: DC
  Administered 2020-03-30: 2 g via INTRAVENOUS
  Filled 2020-03-29 (×2): qty 2

## 2020-03-29 MED ORDER — HEPARIN SODIUM (PORCINE) 5000 UNIT/ML IJ SOLN
5000.0000 [IU] | Freq: Three times a day (TID) | INTRAMUSCULAR | Status: DC
  Administered 2020-03-29 – 2020-04-06 (×24): 5000 [IU] via SUBCUTANEOUS
  Filled 2020-03-29 (×22): qty 1

## 2020-03-29 MED ORDER — SODIUM CHLORIDE 0.9 % IV SOLN
2.0000 g | Freq: Once | INTRAVENOUS | Status: AC
  Administered 2020-03-29: 2 g via INTRAVENOUS
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
## 2020-03-29 NOTE — Progress Notes (Signed)
Notified provider of need to order repeat lactate @ 1840, also notified bedside  RN.

## 2020-03-29 NOTE — Progress Notes (Signed)
Notified bedside nurse of need to administer fluids and antibiotics, repeat lactate  due @ 1707

## 2020-03-29 NOTE — Progress Notes (Addendum)
Pharmacy Antibiotic Note  Samantha [name] is a 80 y.o. female admitted on 03/29/2020 with sepsis.  Pharmacy has been consulted for vancomycin and cefepime dosing. Pt is ordered flagyl x1.  Pt WBC 15.1, febrile to 100.6, LA 2.9 and presenting with new onset AMS and was found in urine/feces.  Scr 2.88 currently. Baseline Scr looks ~1-1.3 from review of history Obtained weight from historical chart (01/2019) 87.5kg  Plan: Vancomycin 1750mg  IV x1 F/u renal function to schedule maintenance dose of vancomycin and troughs as needed. Cefepime 2g IV q24h Monitor clinical progress, CBC, renal recovery, and micro data   Temp (24hrs), Avg:99.6 F (37.6 C), Min:98.5 F (36.9 C), Max:100.6 F (38.1 C)  Recent Labs  Lab 03/29/20 1507  WBC 15.1*  CREATININE 2.88*  LATICACIDVEN 2.9*    CrCl cannot be calculated (Unknown ideal weight.).    Allergies  Allergen Reactions  . Codeine     Upset stomach    . Iohexol Hives     Desc: Pt. states she broke out in hives 30 yrs ago w/ a CT Brain scan.  She has since had a heart cath and this CT today (08/10/05) w/o premeds and did well. No evident reaction.  . Shellfish Allergy Itching    Past history of itching with shrimp, has a history of allergy shots , no problem with seafood in years per patient (??)    Antimicrobials this admission: Cefepime 10/12> Flagyl x1 10/12 vanc 10/12>   Microbiology results: 10/12 Bcx: sent 10/12 resp panel: ordered 10/12 UA: ordered 10/12 Ucx: ordered  Thank you for allowing pharmacy to be a part of this patient's care.  Carolin Guernsey  PGY1 pharmacy resident 03/29/2020 4:39 PM

## 2020-03-29 NOTE — H&P (Addendum)
History and Physical   Samantha [name] KZS:[id number] DOB: 1940-07-29 DOA: 03/29/2020  PCP: Haywood Pao, MD  Patient coming from: nursing home  I have personally briefly reviewed patient's old medical records in [facility].  Chief Concern: mental status change  HPI: Samantha [name] is a 79 y.o. 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  At bedside patient is altered, awake, not alert to self, age, current location.  Daughter-in-law at bedside states that the patient was aware of her presence prior to my coming to bedside.  H&P was not able to be obtained as patient is altered.  Per daughter-in-law patient has received 2 doses of Covid 19 vaccination.  ED Course: Discussed with ED provider, suspect patient has sepsis likely urinary source. Chest x-ray is negative for suspicion pneumonia including Covid.  Review of Systems: Unable to obtain due to mental status change.  Past Medical History:  Diagnosis Date  . Anemia   . 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 Cancer of colon ([place]) 08/2005   STAGE 1 CHEMO AND RADIATION  . Chronic systolic CHF (congestive heart failure) ([place])   . Colitis   . Dementia ([place])   . Febrile neutropenia ([place])   . Hypertension   . Hypokalemia   . LBBB (left bundle branch block)   . Movement disorder   . Mucositis   . Nonischemic  cardiomyopathy ([place])    a. prior varying EF, 30% in 2017. s/p BSX CRTD 05/2016.  Marland Kitchen OSA (obstructive sleep apnea)   . Renal artery aneurysm ([place])   . S/P cardiac cath 2000   Normal coronaries  . Sleep apnea    uses CPAP nightly   Past Surgical History:  Procedure Laterality Date  . APPENDECTOMY    . CARDIAC CATHETERIZATION    . CARPAL TUNNEL RELEASE Left 09/01/2015   Procedure: LEFT CARPAL TUNNEL RELEASE;  Surgeon: Leanora Cover, MD;  Location: [facility];  Service: Orthopedics;  Laterality: Left;  . cataract surgery removal     bilateral  . CESAREAN SECTION    . CHOLECYSTECTOMY    . EP IMPLANTABLE DEVICE N/A 06/15/2016   Procedure: BiV ICD Insertion CRT-D;  Surgeon: Evans Lance, MD;  Location: [facility];  Service: Cardiovascular;  Laterality: N/A;  . HEMORROIDECTOMY    . OPEN REDUCTION INTERNAL FIXATION (ORIF) DISTAL RADIAL FRACTURE Left 04/28/2015   Procedure: OPEN REDUCTION INTERNAL FIXATION (ORIF) LEFT DISTAL RADIUS FRACTURE;  Surgeon: Leanora Cover, MD;  Location: [facility];  Service: Orthopedics;  Laterality: Left;  . PORT-A-CATH REMOVAL    . TONSILLECTOMY    . TUBAL LIGATION    . US ECHOCARDIOGRAPHY  3/12   EF 30 to 35%   Social History:  reports that she has never smoked. She has never used smokeless tobacco. She reports that she does not drink alcohol and does not use drugs.  Allergies  Allergen Reactions  . Codeine     Upset stomach    . Iohexol  Hives     Desc: Pt. states she broke out in hives 30 yrs ago w/ a CT Brain scan.  She has since had a heart cath and this CT today (08/10/05) w/o premeds and did well. No evident reaction.  . Shellfish Allergy Itching    Past history of itching with shrimp, has a history of allergy shots , no problem with seafood in years per patient (??)   Family History  Problem Relation Age of Onset  . Diabetes Sister   . Cancer Brother        PROSTATE   Family history: Family history reviewed  and not pertinent in case of sepsis   Prior to Admission medications   Medication Sig Start Date End Date Taking? Authorizing Provider  carvedilol (COREG) 12.5 MG tablet TAKE 1 TABLET BY MOUTH 2 TIMES A DAY. 03/29/20   Evans Lance, MD  Cholecalciferol (VITAMIN D3) 2000 units TABS Take 2,000 Units by mouth daily.    [provider]  citalopram (CELEXA) 20 MG tablet TAKE 1 TABLET BY MOUTH EVERY DAY 09/17/19   Ward Givens, NP  furosemide (LASIX) 20 MG tablet Take 1 tablet (20 mg total) by mouth daily. 07/16/19   Evans Lance, MD  loratadine (CLARITIN) 10 MG tablet Take 10 mg by mouth daily.    [provider]  losartan (COZAAR) 25 MG tablet Take 25 mg by mouth daily. 04/18/16   [provider]  memantine (NAMENDA) 10 MG tablet TAKE 1 TABLET BY MOUTH TWICE A DAY 01/18/20   Ward Givens, NP  rOPINIRole (REQUIP) 0.5 MG tablet TAKE 1 TABLET (0.5 MG TOTAL) BY MOUTH AT BEDTIME. 03/28/20   Ward Givens, NP   Physical Exam: Vitals:   03/29/20 1745 03/29/20 1815 03/29/20 1830 03/29/20 2205  BP: 109/87  117/70   Pulse: (!) 117  (!) 102   Resp: (!) 30  20   Temp:    99 F (37.2 C)  TempSrc:    Oral  SpO2: 94%  97%   Weight:  88 kg    Height:  5\' 3"  (1.6 m)     Constitutional: altered female, hair is matted, awake not alert to self, appears mildly uncomfortable Eyes: PERRL, lids and conjunctivae normal ENMT: Mucous membranes are dry. poor dentition.  Neck: normal, supple, no masses, no thyromegaly Respiratory: clear to auscultation bilaterally, no wheezing, no crackles. Normal respiratory effort. No accessory muscle use.  Cardiovascular: Regular rate and rhythm, no murmurs / rubs / gallops. No extremity edema. 2+ pedal pulses. No carotid bruits.  Abdomen: no tenderness, no masses palpated. No hepatosplenomegaly. Bowel sounds positive. Minimal trace stool present in the adult pad, no visual evidence of blood  Musculoskeletal: no clubbing / cyanosis. No joint  deformity upper and lower extremities. ROM intact bilaterally upper and lower extremities, no contractures. Normal muscle tone. No overt injuries. Disappearing right thoracic ecchymosis appears old (approximately level of T4 - T6) Skin: no rashes, lesions, ulcers. No induration Neurologic: CN 2-12 grossly intact. Sensation intact, DTR normal. Strength 5/5 in all 4.  Psychiatric: Normal judgment and insight. Alert and oriented x 3. Normal mood.   Labs on Admission: I have personally reviewed following labs and imaging studies  CBC: Recent Labs  Lab 03/29/20 1507  WBC 15.1*  NEUTROABS 13.2*  HGB 14.7  HCT 49.3*  MCV 104.4*  PLT 314   Basic Metabolic Panel: Recent Labs  Lab 03/29/20 1507  NA 161*  K 3.9  CL 121*  CO2  23  GLUCOSE 179*  BUN 60*  CREATININE 2.88*  CALCIUM 9.0   GFR: Estimated Creatinine Clearance: 16.9 mL/min (A) (by C-G formula based on SCr of 2.88 mg/dL (H)).  Liver Function Tests: Recent Labs  Lab 03/29/20 1507  AST 98*  ALT 96*  ALKPHOS 43  BILITOT 1.2  PROT 7.7  ALBUMIN 3.8   No results for input(s): LIPASE, AMYLASE in the last 168 hours. No results for input(s): AMMONIA in the last 168 hours. Coagulation Profile: Recent Labs  Lab 03/29/20 1507  INR 1.3*   Urine analysis:    Component Value Date/Time   COLORURINE AMBER (A) 03/29/2020 1830   APPEARANCEUR CLOUDY (A) 03/29/2020 1830   LABSPEC 1.021 03/29/2020 1830   PHURINE 5.0 03/29/2020 1830   GLUCOSEU NEGATIVE 03/29/2020 1830   HGBUR SMALL (A) 03/29/2020 1830   BILIRUBINUR NEGATIVE 03/29/2020 1830   KETONESUR 5 (A) 03/29/2020 1830   PROTEINUR 100 (A) 03/29/2020 1830   UROBILINOGEN 0.2 02/26/2014 1524   NITRITE NEGATIVE 03/29/2020 1830   LEUKOCYTESUR MODERATE (A) 03/29/2020 1830   Radiological Exams on Admission: Personally reviewed and I agree with radiologist reading as below.  CT HEAD WO CONTRAST  Result Date: 03/29/2020 CLINICAL DATA:  Mental status change. EXAM: CT HEAD  WITHOUT CONTRAST TECHNIQUE: Contiguous axial images were obtained from the base of the skull through the vertex without intravenous contrast. COMPARISON:  CT head September 06, 2017 FINDINGS: Brain: No evidence of acute large vascular territory infarction, hemorrhage, hydrocephalus, extra-axial collection or mass lesion/mass effect. Similar patchy white matter hypoattenuation, compatible with chronic microvascular ischemic disease. Similar diffuse cerebral volume loss with ex vacuo ventricular dilation. Partially empty sella. Vascular: Calcific atherosclerosis. Skull: Normal. Negative for fracture or focal lesion. Sinuses/Orbits: The sinuses are clear. No evidence of acute orbital abnormality. Other: No mastoid effusions. IMPRESSION: No evidence of acute intracranial abnormality. Electronically Signed   By: Margaretha Sheffield MD   On: 03/29/2020 18:25   DG Chest Portable 1 View  Result Date: 03/29/2020 CLINICAL DATA:  Altered mental status EXAM: PORTABLE CHEST 1 VIEW COMPARISON:  09/06/2017 FINDINGS: Cardiac shadow is mildly enlarged but stable. Defibrillator is again seen and stable. The lungs are well aerated bilaterally. No focal infiltrate or effusion is seen. No acute bony abnormality is noted. IMPRESSION: No acute abnormality seen. Electronically Signed   By: Inez Catalina M.D.   On: 03/29/2020 16:00   EKG: Independently reviewed, showing LBBB, sinus tachycardia with rate of 102  Assessment/Plan  Ms. 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  Principal Problem:   Sepsis ([place]) Active Problems:   OSA (obstructive sleep apnea)   HTN (hypertension)   OSA on CPAP   ICD (implantable cardioverter-defibrillator) in place   Hypernatremia   Urinary tract infection without hematuria   Traumatic ecchymosis of left thoracic region   # Sepsis - suspect secondary to urinary source, however consideration is given to bacteremia due to endocarditis in setting of cardiac hardware - With organ dysfunction -  No hypotension  - No overt visual evidence of cellulitis, abscess on examination - s/p sepsis bolus in the ED with LR - Continue 1/2 NS IVF at 150 cc/hr as patient does not appear to be volume overloaded - ED abx includes cefepime 2 g IV, metronidazole IV, vancomycin - Continue broad spectrum at this time  - Urine cx collected and pending - Blood cx collected and pending  # AKI on CKD 3 - baseline cr is 1.02-1.3 - suspect secondary to sepsis - Continue IVF - No clinical suspicion for cardiorenal at this time - Holding furosemide and losartan  # NYHA class II  # Nonischemic cardiomegaly - s/p BiV ICD placement [facility]) - appears compensated this time - No edema in the lower extremity   # h/o SVT - avoid caffeine; follows with cardiology  # OSA on cpap qhs - cpap ordered  # Hypertension - carvedilol 12.5 mg BID resumed  - Holding furosemide 20 mg daily and losartan  # Left thoracic (T4-T6) ecchymosis - disappearing light green/yellow and negative for signs of infection - Present on admission   # Anxiety/depression - resumed citalopram 20 mg daily  # Dementia and memory disturbance - resumed memantine 10 mg daily # Restless leg syndrome - ropinirole 0.5 mg qhs   # Family has been updated - Daughter, Samantha [name], [phone] [place], [phone]  DVT prophylaxis: heparin Code Status: DNR,  spoke with both healthcare power of attorney, Samantha [name] (daughter) and Samantha [name] (son) Diet: NPO  Family Communication: yes, spoke with both Samantha [name] and Hydrologist Disposition Plan: pending clinical course Consults called: none indicated at this time Admission status: inpatient  Quantarius Genrich N Haly Feher D.O. [facility]  If 7AM-7PM, please contact day-coverage provider [email or web address]  03/29/2020, 10:54 PM

## 2020-03-30 DIAGNOSIS — A419 Sepsis, unspecified organism: Secondary | ICD-10-CM | POA: Diagnosis not present

## 2020-03-30 DIAGNOSIS — E870 Hyperosmolality and hypernatremia: Secondary | ICD-10-CM | POA: Diagnosis not present

## 2020-03-30 DIAGNOSIS — N179 Acute kidney failure, unspecified: Secondary | ICD-10-CM | POA: Diagnosis not present

## 2020-03-30 DIAGNOSIS — N390 Urinary tract infection, site not specified: Secondary | ICD-10-CM | POA: Diagnosis not present

## 2020-03-30 DIAGNOSIS — G9341 Metabolic encephalopathy: Secondary | ICD-10-CM

## 2020-03-30 LAB — COMPREHENSIVE METABOLIC PANEL
ALT: 76 U/L — ABNORMAL HIGH (ref 0–44)
AST: 76 U/L — ABNORMAL HIGH (ref 15–41)
Albumin: 3.3 g/dL — ABNORMAL LOW (ref 3.5–5.0)
Alkaline Phosphatase: 44 U/L (ref 38–126)
Anion gap: 16 — ABNORMAL HIGH (ref 5–15)
BUN: 60 mg/dL — ABNORMAL HIGH (ref 8–23)
CO2: 20 mmol/L — ABNORMAL LOW (ref 22–32)
Calcium: 8.9 mg/dL (ref 8.9–10.3)
Chloride: 124 mmol/L — ABNORMAL HIGH (ref 98–111)
Creatinine, Ser: 2.28 mg/dL — ABNORMAL HIGH (ref 0.44–1.00)
GFR, Estimated: 20 mL/min — ABNORMAL LOW (ref >60)
Glucose, Bld: 193 mg/dL — ABNORMAL HIGH (ref 70–99)
Potassium: 3.4 mmol/L — ABNORMAL LOW (ref 3.5–5.1)
Sodium: 160 mmol/L — ABNORMAL HIGH (ref 135–145)
Total Bilirubin: 1 mg/dL (ref 0.3–1.2)
Total Protein: 6.9 g/dL (ref 6.5–8.1)

## 2020-03-30 LAB — BASIC METABOLIC PANEL
Anion gap: 13 (ref 5–15)
Anion gap: 13 (ref 5–15)
BUN: 59 mg/dL — ABNORMAL HIGH (ref 8–23)
BUN: 59 mg/dL — ABNORMAL HIGH (ref 8–23)
CO2: 23 mmol/L (ref 22–32)
CO2: 21 mmol/L — ABNORMAL LOW (ref 22–32)
Calcium: 9 mg/dL (ref 8.9–10.3)
Calcium: 9 mg/dL (ref 8.9–10.3)
Chloride: 125 mmol/L — ABNORMAL HIGH (ref 98–111)
Chloride: 124 mmol/L — ABNORMAL HIGH (ref 98–111)
Creatinine, Ser: 2.03 mg/dL — ABNORMAL HIGH (ref 0.44–1.00)
Creatinine, Ser: 1.97 mg/dL — ABNORMAL HIGH (ref 0.44–1.00)
GFR, Estimated: 23 mL/min — ABNORMAL LOW (ref >60)
GFR, Estimated: 24 mL/min — ABNORMAL LOW (ref >60)
Glucose, Bld: 193 mg/dL — ABNORMAL HIGH (ref 70–99)
Glucose, Bld: 192 mg/dL — ABNORMAL HIGH (ref 70–99)
Potassium: 3.6 mmol/L (ref 3.5–5.1)
Potassium: 3.9 mmol/L (ref 3.5–5.1)
Sodium: 161 mmol/L (ref 135–145)
Sodium: 158 mmol/L — ABNORMAL HIGH (ref 135–145)

## 2020-03-30 LAB — CBC
HCT: 46.9 % — ABNORMAL HIGH (ref 36.0–46.0)
Hemoglobin: 13.9 g/dL (ref 12.0–15.0)
MCH: 31.7 pg (ref 26.0–34.0)
MCHC: 29.6 g/dL — ABNORMAL LOW (ref 30.0–36.0)
MCV: 106.8 fL — ABNORMAL HIGH (ref 80.0–100.0)
Platelets: 166 10*3/uL (ref 150–400)
RBC: 4.39 MIL/uL (ref 3.87–5.11)
RDW: 13.6 % (ref 11.5–15.5)
WBC: 18.7 10*3/uL — ABNORMAL HIGH (ref 4.0–10.5)
nRBC: 0.1 % (ref 0.0–0.2)

## 2020-03-30 LAB — CORTISOL-AM, BLOOD: Cortisol - AM: 70.3 ug/dL — ABNORMAL HIGH (ref 6.7–22.6)

## 2020-03-30 LAB — PROCALCITONIN: Procalcitonin: 0.47 ng/mL

## 2020-03-30 LAB — PROTIME-INR
INR: 1.5 — ABNORMAL HIGH (ref 0.8–1.2)
Prothrombin Time: 17.2 seconds — ABNORMAL HIGH (ref 11.4–15.2)

## 2020-03-30 LAB — CBG MONITORING, ED: Glucose-Capillary: 141 mg/dL — ABNORMAL HIGH (ref 70–99)

## 2020-03-30 LAB — GLUCOSE, CAPILLARY: Glucose-Capillary: 165 mg/dL — ABNORMAL HIGH (ref 70–99)

## 2020-03-30 MED ORDER — POTASSIUM CL IN DEXTROSE 5% 20 MEQ/L IV SOLN
20.0000 meq | INTRAVENOUS | Status: DC
  Administered 2020-03-30 – 2020-04-01 (×5): 20 meq via INTRAVENOUS
  Filled 2020-03-30 (×7): qty 1000

## 2020-03-30 MED ORDER — POTASSIUM CHLORIDE 10 MEQ/100ML IV SOLN
10.0000 meq | INTRAVENOUS | Status: AC
  Administered 2020-03-30 (×2): 10 meq via INTRAVENOUS
  Filled 2020-03-30: qty 100

## 2020-03-30 MED ORDER — INSULIN ASPART 100 UNIT/ML ~~LOC~~ SOLN
0.0000 [IU] | SUBCUTANEOUS | Status: DC
  Administered 2020-03-30: 3 [IU] via SUBCUTANEOUS
  Administered 2020-03-30: 2 [IU] via SUBCUTANEOUS
  Administered 2020-03-31 – 2020-04-01 (×5): 3 [IU] via SUBCUTANEOUS
  Administered 2020-04-01 (×2): 2 [IU] via SUBCUTANEOUS

## 2020-03-30 MED ORDER — VANCOMYCIN HCL IN DEXTROSE 1-5 GM/200ML-% IV SOLN
1000.0000 mg | INTRAVENOUS | Status: DC
  Administered 2020-03-30: 1000 mg via INTRAVENOUS
  Filled 2020-03-30 (×3): qty 200

## 2020-03-30 NOTE — Progress Notes (Addendum)
[facility] PROGRESS NOTE   Samantha [name] ZOX:[id number] DOB: [birth year] DOA: 03/29/2020  PCP: Haywood Pao, MD  Brief History/Interval Summary: 79 y.o. 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  Patient was noted to have acute renal failure and hyponatremia.  There was concern for a urinary tract infection.  Patient was hospitalized for further management. Per daughter-in-law patient has received 2 doses of Covid 19 vaccination.  Reason for Visit: Acute metabolic encephalopathy.  Acute kidney injury.  Consultants: None  Procedures: None  Antibiotics: Anti-infectives (From admission, onward)   Start     Dose/Rate Route Frequency Ordered Stop   03/30/20 2200  vancomycin (VANCOCIN) IVPB 1000 mg/200 mL premix        1,000 mg 200 mL/hr over 60 Minutes Intravenous Every 24 hours 03/30/20 0826     03/30/20 1600  ceFEPIme (MAXIPIME) 2 g in sodium chloride 0.9 % 100 mL IVPB        2 g 200 mL/hr over 30 Minutes Intravenous Every 24 hours 03/29/20 1637     03/29/20 1645  vancomycin variable dose per unstable renal function (pharmacist dosing)         Does not apply See admin instructions 03/29/20 1645     03/29/20 1630  ceFEPIme (MAXIPIME) 2 g in sodium chloride 0.9 % 100 mL IVPB        2 g 200 mL/hr over 30 Minutes Intravenous  Once 03/29/20 1617 03/29/20 1752   03/29/20 1630  metroNIDAZOLE (FLAGYL) IVPB 500 mg        500  mg 100 mL/hr over 60 Minutes Intravenous  Once 03/29/20 1617 03/29/20 1917   03/29/20 1630  vancomycin (VANCOCIN) IVPB 1000 mg/200 mL premix  Status:  Discontinued        1,000 mg 200 mL/hr over 60 Minutes Intravenous  Once 03/29/20 1617 03/29/20 1627   03/29/20 1630  vancomycin (VANCOREADY) IVPB 1750 mg/350 mL        1,750 mg 175 mL/hr over 120 Minutes Intravenous  Once 03/29/20 1627 03/29/20 2032      Subjective/Interval History: Patient remains confused.  Unable to provide any history.    Assessment/Plan:  Sepsis, present on admission secondary to urinary source, with organ dysfunction Patient had elevated lactic acid level.  Initially 2.9.  Subsequently increased to 3.2 and then to 2.8.  Procalcitonin 0.47.  Patient getting broad-spectrum antibiotics currently, vancomycin and cefepime.  Continue to monitor closely.  Follow-up on cultures.  Blood pressures are stable this morning.  Low-grade fever noted overnight.  Urinary tract infection Abnormal UA noted.  Patient unable to mention if she has any symptoms however we are empirically treating her.  Follow-up on cultures.  She has leukocytosis.  Acute kidney injury on chronic kidney disease stage IIIa/hypernatremia/hypokalemia Baseline creatinine is around 1.3.  Presented with creatinine of 2.8.  Patient getting aggressively hydrated.  Monitor urine output.  Creatinine slightly better to 2.28.  Renal failure most likely due to hypovolemia in the setting of ARB use.  Continues to have hypernatremia which is  likely due to free water deficits.  We will recheck her metabolic panel at noon.  She is currently noted to be on half normal saline.  May have to switch over to D5 if there is no improvement in the sodium levels.  Acute metabolic encephalopathy Reason for her encephalopathy is multifactorial including sepsis, metabolic derangements, dehydration.  No obvious focal deficits noted.  CT head did not show any acute findings.  She is  moving all her extremities.    Transaminitis Mildly elevated AST and ALT noted.  Bilirubin and alkaline phosphatase is normal.  Continue to monitor.  Macrocytosis Check TSH, B12, folate levels.  History of chronic systolic CHF She is status post ICD placement.  Seems to be reasonably well compensated and actually more hypovolemic at this time.  Echocardiogram available from 2017 which showed a EF of 30%.  Currently her cardiac medications are on hold as patient is encephalopathic and unable to take orally at this time.  She is noted to be on carvedilol, furosemide and losartan.  History of SVT Stable.  Monitor on telemetry.  History of obstructive sleep apnea on CPAP Continue CPAP.  Essential hypertension Monitor blood pressures closely.  Oral medications on hold.  Monitor blood pressures closely.  Thoracic ecchymosis No evidence for infection.  History of anxiety and depression Was on citalopram at home which is currently on hold.  History of dementia and memory disturbance On Namenda at home which can be resumed when she is more awake and alert.  History of restless leg syndrome Requip is on hold.  Obesity Estimated body mass index is 34.37 kg/m as calculated from the following:   Height as of this encounter: 5\' 3"  (1.6 m).   Weight as of this encounter: 88 kg.   DVT Prophylaxis: Subcutaneous heparin Code Status: DNR Family Communication: We will update family later today Disposition Plan: Hopefully return to SNF when improved.  Status is: Inpatient  Remains inpatient appropriate because:Altered mental status   Dispo: The patient is from: SNF              Anticipated d/c is to: SNF              Anticipated d/c date is: 3 days              Patient currently is not medically stable to d/c.     Medications:  Scheduled: . heparin  5,000 Units Subcutaneous Q8H  . vancomycin variable dose per unstable renal function (pharmacist dosing)   Does not apply See  admin instructions   Continuous: . sodium chloride 150 mL/hr at 03/30/20 0912  . ceFEPime (MAXIPIME) IV    . vancomycin     PRN:   Objective:  Vital Signs  Vitals:   03/29/20 1830 03/29/20 2205 03/29/20 2330 03/30/20 0645  BP: 117/70  (!) 138/119 114/62  Pulse: (!) 102  95 (!) 101  Resp: 20   18  Temp:  99 F (37.2 C)    TempSrc:  Oral    SpO2: 97%  93% 94%  Weight:      Height:        Intake/Output Summary (Last 24 hours) at 03/30/2020 1002 Last data filed at 03/29/2020 2100 Gross per 24 hour  Intake 1018.81 ml  Output --  Net 1018.81 ml   Filed Weights   03/29/20 1815  Weight: 88 kg    General appearance: Noted to be awake.  Distracted.  Confused.  Not answering any questions. Dry mucous  membranes noted Resp: Clear to auscultation bilaterally.  Normal effort Cardio: S1-S2 is normal regular.  No S3-S4.  No rubs murmurs or bruit GI: Abdomen is soft.  Nontender nondistended.  Bowel sounds are present normal.  No masses organomegaly Extremities: No edema.  Noted to be moving all her extremities Neurologic: Disoriented.  No obvious focal neurological deficits noted.    Lab Results:  Data Reviewed: I have personally reviewed following labs and imaging studies  CBC: Recent Labs  Lab 03/29/20 1507 03/30/20 0456  WBC 15.1* 18.7*  NEUTROABS 13.2*  --   HGB 14.7 13.9  HCT 49.3* 46.9*  MCV 104.4* 106.8*  PLT 187 937    Basic Metabolic Panel: Recent Labs  Lab 03/29/20 1507 03/30/20 0456  NA 161* 160*  K 3.9 3.4*  CL 121* 124*  CO2 23 20*  GLUCOSE 179* 193*  BUN 60* 60*  CREATININE 2.88* 2.28*  CALCIUM 9.0 8.9    GFR: Estimated Creatinine Clearance: 21.4 mL/min (A) (by C-G formula based on SCr of 2.28 mg/dL (H)).  Liver Function Tests: Recent Labs  Lab 03/29/20 1507 03/30/20 0456  AST 98* 76*  ALT 96* 76*  ALKPHOS 43 44  BILITOT 1.2 1.0  PROT 7.7 6.9  ALBUMIN 3.8 3.3*     Coagulation Profile: Recent Labs  Lab 03/29/20 1507  03/30/20 0456  INR 1.3* 1.5*     Recent Results (from the past 240 hour(s))  Culture, blood (Routine x 2)     Status: None (Preliminary result)   Collection Time: 03/29/20  3:21 PM   Specimen: BLOOD  Result Value Ref Range Status   Specimen Description BLOOD SITE NOT SPECIFIED  Final   Special Requests   Final    BOTTLES DRAWN AEROBIC AND ANAEROBIC Blood Culture adequate volume   Culture   Final    NO GROWTH < 24 HOURS Performed at [facility], [place] [address] [place], [place] [address]    Report Status PENDING  Incomplete  Culture, blood (Routine x 2)     Status: None (Preliminary result)   Collection Time: 03/29/20  4:39 PM   Specimen: BLOOD  Result Value Ref Range Status   Specimen Description BLOOD SITE NOT SPECIFIED  Final   Special Requests   Final    BOTTLES DRAWN AEROBIC AND ANAEROBIC Blood Culture adequate volume   Culture   Final    NO GROWTH < 24 HOURS Performed at [facility], [place] [address] [place], [place] [address]    Report Status PENDING  Incomplete  Respiratory Panel by RT PCR (Flu A&B, Covid) - Nasopharyngeal Swab     Status: None   Collection Time: 03/29/20  6:30 PM   Specimen: Nasopharyngeal Swab  Result Value Ref Range Status   SARS Coronavirus 2 by RT PCR NEGATIVE NEGATIVE Final    Comment: (NOTE) SARS-CoV-2 target nucleic acids are NOT DETECTED.  The SARS-CoV-2 RNA is generally detectable in upper respiratoy specimens during the acute phase of infection. The lowest concentration of SARS-CoV-2 viral copies this assay can detect is 131 copies/mL. A negative result does not preclude SARS-Cov-2 infection and should not be used as the sole basis for treatment or other patient management decisions. A negative result may occur with  improper specimen collection/handling, submission of specimen other than nasopharyngeal swab, presence of viral mutation(s) within the areas targeted by this assay, and inadequate number of viral copies (<131  copies/mL). A negative result must be combined with clinical observations, patient  history, and epidemiological information. The expected result is Negative.  Fact Sheet for Patients:  PinkCheek.be  Fact Sheet for Healthcare Providers:  GravelBags.it  This test is no t yet approved or cleared by the [place] FDA and  has been authorized for detection and/or diagnosis of SARS-CoV-2 by FDA under an Emergency Use Authorization (EUA). This EUA will remain  in effect (meaning this test can be used) for the duration of the COVID-19 declaration under Section 564(b)(1) of the Act, 21 U.S.C. section 360bbb-3(b)(1), unless the authorization is terminated or revoked sooner.     Influenza A by PCR NEGATIVE NEGATIVE Final   Influenza B by PCR NEGATIVE NEGATIVE Final    Comment: (NOTE) The Xpert Xpress SARS-CoV-2/FLU/RSV assay is intended as an aid in  the diagnosis of influenza from Nasopharyngeal swab specimens and  should not be used as a sole basis for treatment. Nasal washings and  aspirates are unacceptable for Xpert Xpress SARS-CoV-2/FLU/RSV  testing.  Fact Sheet for Patients: PinkCheek.be  Fact Sheet for Healthcare Providers: GravelBags.it  This test is not yet approved or cleared by the [place] FDA and  has been authorized for detection and/or diagnosis of SARS-CoV-2 by  FDA under an Emergency Use Authorization (EUA). This EUA will remain  in effect (meaning this test can be used) for the duration of the  Covid-19 declaration under Section 564(b)(1) of the Act, 21  U.S.C. section 360bbb-3(b)(1), unless the authorization is  terminated or revoked. Performed at [facility], [place] [address], [place], [place] [address]       Radiology Studies: CT HEAD WO CONTRAST  Result Date: 03/29/2020 CLINICAL DATA:  Mental status change. EXAM: CT HEAD WITHOUT  CONTRAST TECHNIQUE: Contiguous axial images were obtained from the base of the skull through the vertex without intravenous contrast. COMPARISON:  CT head September 06, 2017 FINDINGS: Brain: No evidence of acute large vascular territory infarction, hemorrhage, hydrocephalus, extra-axial collection or mass lesion/mass effect. Similar patchy white matter hypoattenuation, compatible with chronic microvascular ischemic disease. Similar diffuse cerebral volume loss with ex vacuo ventricular dilation. Partially empty sella. Vascular: Calcific atherosclerosis. Skull: Normal. Negative for fracture or focal lesion. Sinuses/Orbits: The sinuses are clear. No evidence of acute orbital abnormality. Other: No mastoid effusions. IMPRESSION: No evidence of acute intracranial abnormality. Electronically Signed   By: Margaretha Sheffield MD   On: 03/29/2020 18:25   DG Chest Portable 1 View  Result Date: 03/29/2020 CLINICAL DATA:  Altered mental status EXAM: PORTABLE CHEST 1 VIEW COMPARISON:  09/06/2017 FINDINGS: Cardiac shadow is mildly enlarged but stable. Defibrillator is again seen and stable. The lungs are well aerated bilaterally. No focal infiltrate or effusion is seen. No acute bony abnormality is noted. IMPRESSION: No acute abnormality seen. Electronically Signed   By: Inez Catalina M.D.   On: 03/29/2020 16:00       LOS: 1 day   Joscelynn Brutus [employer] on [email or web address]  03/30/2020, 10:02 AM

## 2020-03-30 NOTE — Progress Notes (Signed)
New Admission Note:  Arrival Method: Via stretcher from ED to 71m18 Mental Orientation: Alert but not Oriented.  Telemetry: CCMD verified. Box-18 Assessment: Completed Skin: Refer to flowsheet IV: Left FA & Right AC  Pain: 0/10 Safety Measures: Safety Fall Prevention Plan discussed with patient. Admission: Completed [facility] [place] Orientation: Patient has been orientated to the room, unit and the staff.  Orders have been reviewed and are being implemented. Will continue to monitor the patient. Call light has been placed within reach and bed alarm has been activated.   Vassie Moselle, RN  Phone Number: [phone]

## 2020-03-30 NOTE — Progress Notes (Signed)
Pharmacy Antibiotic Note  Samantha [name] is a 79 y.o. female admitted on 03/29/2020 with sepsis.  Pharmacy has been consulted for vancomycin and cefepime dosing. Pt is ordered flagyl x1.   WBC 15 > 18, SCr 2.88>2.28, PCT 0.47, LA 3.2>2.8   Plan: Start vancomycin 1 gm IV Q 24 hours  Continue cefepime 2g IV q24h Monitor clinical progress, CBC, renal recovery, and micro data   Temp (24hrs), Avg:99.4 F (37.4 C), Min:98.5 F (36.9 C), Max:100.6 F (38.1 C)  Recent Labs  Lab 03/29/20 1507 03/29/20 1640 03/29/20 1929 03/30/20 0456  WBC 15.1*  --   --  18.7*  CREATININE 2.88*  --   --  2.28*  LATICACIDVEN 2.9* 3.2* 2.8*  --     Estimated Creatinine Clearance: 21.4 mL/min (A) (by C-G formula based on SCr of 2.28 mg/dL (H)).    Allergies  Allergen Reactions  . Codeine     Upset stomach    . Iohexol Hives     Desc: Pt. states she broke out in hives 30 yrs ago w/ a CT Brain scan.  She has since had a heart cath and this CT today (08/10/05) w/o premeds and did well. No evident reaction.  . Shellfish Allergy Itching    Past history of itching with shrimp, has a history of allergy shots , no problem with seafood in years per patient (??)    Antimicrobials this admission: Cefepime 10/12> Flagyl x1 10/12 vanc 10/12>   Microbiology results: 10/12 Bcx: sent 10/12 resp panel: ordered 10/12 UA: ordered 10/12 Ucx: ordered  Thank you for allowing pharmacy to be a part of this patient's care.  Albertina Parr, PharmD., BCPS, BCCCP Clinical Pharmacist Please refer to [facility] for unit-specific pharmacist

## 2020-03-30 NOTE — ED Notes (Signed)
MD notified of sodium of 161

## 2020-03-30 NOTE — ED Notes (Signed)
Requested fluids from pharmacy.

## 2020-03-30 NOTE — ED Notes (Signed)
[phone] Samantha [name] would like an update.

## 2020-03-30 NOTE — Evaluation (Signed)
Clinical/Bedside Swallow Evaluation Patient Details  Name: Samantha [name] MRN: [id number] Date of Birth: 23-Aug-1940  Today's Date: 03/30/2020 Time: SLP Start Time (ACUTE ONLY): 6659 SLP Stop Time (ACUTE ONLY): 1410 SLP Time Calculation (min) (ACUTE ONLY): 16 min  Past Medical History:  Past Medical History:  Diagnosis Date  . Anemia   . 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 Cancer of colon ([place]) 08/2005   STAGE 1 CHEMO AND RADIATION  . Chronic systolic CHF (congestive heart failure) ([place])   . Colitis   . Dementia ([place])   . Febrile neutropenia ([place])   . Hypertension   . Hypokalemia   . LBBB (left bundle branch block)   . Movement disorder   . Mucositis   . Nonischemic cardiomyopathy ([place])    a. prior varying EF, 30% in 2017. s/p BSX CRTD 05/2016.  Marland Kitchen OSA (obstructive sleep apnea)   . Renal artery aneurysm ([place])   . S/P cardiac cath 2000   Normal coronaries  . Sleep apnea    uses CPAP nightly   Past Surgical History:  Past Surgical History:  Procedure Laterality Date  . APPENDECTOMY    . CARDIAC CATHETERIZATION    . CARPAL TUNNEL RELEASE Left 09/01/2015   Procedure: LEFT CARPAL TUNNEL RELEASE;  Surgeon: Leanora Cover, MD;  Location: [facility];  Service: Orthopedics;  Laterality: Left;  . cataract surgery removal     bilateral  . CESAREAN SECTION    . CHOLECYSTECTOMY    . EP IMPLANTABLE DEVICE N/A 06/15/2016   Procedure: BiV ICD Insertion CRT-D;  Surgeon: Evans Lance, MD;  Location: [facility];  Service: Cardiovascular;  Laterality: N/A;  . HEMORROIDECTOMY    . OPEN REDUCTION INTERNAL FIXATION (ORIF) DISTAL RADIAL FRACTURE Left 04/28/2015   Procedure: OPEN REDUCTION INTERNAL FIXATION (ORIF) LEFT DISTAL RADIUS FRACTURE;  Surgeon: Leanora Cover, MD;  Location: [facility];  Service: Orthopedics;  Laterality: Left;  . PORT-A-CATH REMOVAL    . TONSILLECTOMY    . TUBAL LIGATION    . US  ECHOCARDIOGRAPHY  3/12   EF 30 to 35%   HPI:  79 y.o. 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  Dx sepsis, UTI, AKI, metabolic encephalopathy.    Assessment / Plan / Recommendation Clinical Impression  Pt presents with an acute dysphagia related to her confusion/MS.  She was impulsive, restless, picking at lines.  She had difficulty focusing attention in order to accept trial POs, with poor anticipation and recognition of approaching spoon, cup edge despite hand-over-hand assist to feed herself. She demonstrated oral holding of water and pudding, with max verbal cues needed to initiate a swallow. Once a swallow triggered, there were no s/s of aspiration.  Recommend allowing sips of water/ice chips; give critical meds crushed in applesauce/pudding if pt demonstrates awareness.  Have [place] available for suctioning.  Anticipate improvements as MS returns to baseline; SLP will follow for readiness.  D/W RN.    SLP Visit Diagnosis: Dysphagia, unspecified (R13.10)    Aspiration Risk  Mild aspiration risk    Diet  Recommendation   NPO except sips/chips and critical meds crushed in puree  Medication Administration: Crushed with puree    Other  Recommendations Oral Care Recommendations: Oral care QID   Follow up Recommendations Other (comment) (tba)      Frequency and Duration min 2x/week  2 weeks       Prognosis Prognosis for Safe Diet Advancement: Good      Swallow Study   General  HPI: 79 y.o. 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  Dx sepsis, UTI, AKI, metabolic encephalopathy.  Type of Study: Bedside Swallow Evaluation Previous Swallow Assessment: no Diet Prior to this Study: NPO Temperature Spikes Noted: Yes Respiratory Status: Room air History of Recent Intubation: No Behavior/Cognition: Alert;Confused;Impulsive Oral Cavity Assessment: Dry Oral Care Completed by SLP: No Oral Cavity - Dentition: Adequate natural dentition Self-Feeding Abilities: Total assist Patient Positioning: Upright in bed Baseline Vocal Quality: Normal Volitional Cough: Cognitively unable to elicit Volitional Swallow: Unable to elicit    Oral/Motor/Sensory Function Overall Oral Motor/Sensory Function: Within functional limits   Ice Chips Ice chips: Impaired Presentation: Spoon Oral Phase Impairments: Poor awareness of bolus Oral Phase Functional Implications: Oral holding   Thin Liquid Thin Liquid: Impaired Presentation: Spoon;Straw Oral Phase Impairments: Poor awareness of bolus Oral Phase Functional Implications: Oral holding    Nectar Thick Nectar Thick Liquid: Not tested   Honey Thick Honey Thick Liquid: Not tested   Puree Puree: Impaired Presentation: Spoon Oral Phase Impairments: Poor awareness of bolus Oral Phase Functional Implications: Oral holding   Solid     Solid: Not tested      Samantha [name] 03/30/2020,2:50 PM   Samantha [name] L. Tivis Ringer, [place] Office number [phone] Pager [phone]

## 2020-03-30 NOTE — ED Notes (Signed)
Attempted to trial pt with drinking before giving medications.  Pt only able to take 1 sip at a time and then acts as if her throat hurts.  Will notify MD.

## 2020-03-31 DIAGNOSIS — N390 Urinary tract infection, site not specified: Secondary | ICD-10-CM | POA: Diagnosis not present

## 2020-03-31 DIAGNOSIS — E870 Hyperosmolality and hypernatremia: Secondary | ICD-10-CM | POA: Diagnosis not present

## 2020-03-31 DIAGNOSIS — A419 Sepsis, unspecified organism: Secondary | ICD-10-CM | POA: Diagnosis not present

## 2020-03-31 DIAGNOSIS — N179 Acute kidney failure, unspecified: Secondary | ICD-10-CM | POA: Diagnosis not present

## 2020-03-31 LAB — BASIC METABOLIC PANEL
Anion gap: 9 (ref 5–15)
BUN: 39 mg/dL — ABNORMAL HIGH (ref 8–23)
CO2: 24 mmol/L (ref 22–32)
Calcium: 8.6 mg/dL — ABNORMAL LOW (ref 8.9–10.3)
Chloride: 120 mmol/L — ABNORMAL HIGH (ref 98–111)
Creatinine, Ser: 1.3 mg/dL — ABNORMAL HIGH (ref 0.44–1.00)
GFR, Estimated: 39 mL/min — ABNORMAL LOW (ref >60)
Glucose, Bld: 176 mg/dL — ABNORMAL HIGH (ref 70–99)
Potassium: 3.8 mmol/L (ref 3.5–5.1)
Sodium: 153 mmol/L — ABNORMAL HIGH (ref 135–145)

## 2020-03-31 LAB — COMPREHENSIVE METABOLIC PANEL
ALT: 65 U/L — ABNORMAL HIGH (ref 0–44)
AST: 70 U/L — ABNORMAL HIGH (ref 15–41)
Albumin: 3 g/dL — ABNORMAL LOW (ref 3.5–5.0)
Alkaline Phosphatase: 53 U/L (ref 38–126)
Anion gap: 14 (ref 5–15)
BUN: 55 mg/dL — ABNORMAL HIGH (ref 8–23)
CO2: 18 mmol/L — ABNORMAL LOW (ref 22–32)
Calcium: 8.9 mg/dL (ref 8.9–10.3)
Chloride: 123 mmol/L — ABNORMAL HIGH (ref 98–111)
Creatinine, Ser: 1.57 mg/dL — ABNORMAL HIGH (ref 0.44–1.00)
GFR, Estimated: 31 mL/min — ABNORMAL LOW (ref >60)
Glucose, Bld: 188 mg/dL — ABNORMAL HIGH (ref 70–99)
Potassium: 4 mmol/L (ref 3.5–5.1)
Sodium: 155 mmol/L — ABNORMAL HIGH (ref 135–145)
Total Bilirubin: 1.1 mg/dL (ref 0.3–1.2)
Total Protein: 6.5 g/dL (ref 6.5–8.1)

## 2020-03-31 LAB — URINE CULTURE: Culture: 4000 — AB

## 2020-03-31 LAB — CBC
HCT: 46.2 % — ABNORMAL HIGH (ref 36.0–46.0)
Hemoglobin: 13.9 g/dL (ref 12.0–15.0)
MCH: 32 pg (ref 26.0–34.0)
MCHC: 30.1 g/dL (ref 30.0–36.0)
MCV: 106.5 fL — ABNORMAL HIGH (ref 80.0–100.0)
Platelets: 106 10*3/uL — ABNORMAL LOW (ref 150–400)
RBC: 4.34 MIL/uL (ref 3.87–5.11)
RDW: 13.6 % (ref 11.5–15.5)
WBC: 17 10*3/uL — ABNORMAL HIGH (ref 4.0–10.5)
nRBC: 0 % (ref 0.0–0.2)

## 2020-03-31 LAB — GLUCOSE, CAPILLARY
Glucose-Capillary: 133 mg/dL — ABNORMAL HIGH (ref 70–99)
Glucose-Capillary: 151 mg/dL — ABNORMAL HIGH (ref 70–99)
Glucose-Capillary: 151 mg/dL — ABNORMAL HIGH (ref 70–99)
Glucose-Capillary: 156 mg/dL — ABNORMAL HIGH (ref 70–99)
Glucose-Capillary: 158 mg/dL — ABNORMAL HIGH (ref 70–99)
Glucose-Capillary: 170 mg/dL — ABNORMAL HIGH (ref 70–99)
Glucose-Capillary: 189 mg/dL — ABNORMAL HIGH (ref 70–99)

## 2020-03-31 LAB — TSH: TSH: 0.756 u[IU]/mL (ref 0.350–4.500)

## 2020-03-31 LAB — VITAMIN B12: Vitamin B-12: 225 pg/mL (ref 180–914)

## 2020-03-31 LAB — FOLATE: Folate: 22.3 ng/mL (ref >5.9)

## 2020-03-31 MED ORDER — SODIUM CHLORIDE 0.9 % IV SOLN
1.0000 g | INTRAVENOUS | Status: DC
  Administered 2020-03-31 – 2020-04-01 (×2): 1 g via INTRAVENOUS
  Filled 2020-03-31 (×3): qty 10

## 2020-03-31 NOTE — Progress Notes (Signed)
  Speech Language Pathology Treatment: Dysphagia  Patient Details Name: Samantha [name] MRN: [id number] DOB: 1940-10-03 Today's Date: 03/31/2020 Time: 4142-3953 SLP Time Calculation (min) (ACUTE ONLY): 16 min  Assessment / Plan / Recommendation Clinical Impression  Pt was seen for dysphagia treatment. She was lethargic during the session and required frequent verbal/tactile stimulation to maintain an adequate level of alertness for prolonged periods of time. She continues to tolerate puree solids, ice chips, and thin liquids without overt s/sx of aspiration. However, she also continues to exhibit impaired bolus awareness which places her at increased risk of aspiration before deglutition. She exhibited oral holding up to 15 seconds with thin liquids and up to 31 seconds with puree solids despite verbal and tactile prompts for swallowing. SLP anticipates that the pt will be able to tolerate a p.o. diet with improvement in mentation. However, she does not present as a candidate for safe oral intake at this time, and it is therefore recommended that her NPO status be maintained. Short-term non-oral alimentation will likely be warranted if her current impairments persist. SLP will continue to follow pt.    HPI HPI: 79 y.o. 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  Dx sepsis, UTI, AKI, metabolic encephalopathy.       SLP Plan  Continue with current plan of care       Recommendations  Diet  recommendations: NPO Medication Administration: Via alternative means                Oral Care Recommendations: Oral care QID Follow up Recommendations: Other (comment) (tba) SLP Visit Diagnosis: Dysphagia, oral phase (R13.11) Plan: Continue with current plan of care       Jenet Durio I. Hardin Negus, [place], [place] Office number [phone] Pager [place] 03/31/2020, 10:53 AM

## 2020-03-31 NOTE — Plan of Care (Signed)
  Problem: Safety: Goal: Ability to remain free from injury will improve Outcome: Progressing   

## 2020-03-31 NOTE — Therapy (Addendum)
Pt is unable to wear cpap pt has on soft mitts restricting her ability to remove mask in a emergency

## 2020-03-31 NOTE — Progress Notes (Addendum)
[facility] PROGRESS NOTE   Samantha [name] TIR:[id number] DOB: January 18, 1941 DOA: 03/29/2020  PCP: Haywood Pao, MD  Brief History/Interval Summary: 79 y.o.femalewith medical history significant fornonischemic cardiomegaly, heart failure with reduced ejection fraction, history of small bowel obstruction, left bundle branch block status post bivalve the ICD insertionin 05/2016,renal artery aneurysm, s/p cardiac catheterization in 2000,dementia/chronic memory loss/vascular dementia without behavioral disturbance, hypertension, OSA on CPAP nightly, history of frequent UTI,history of closed colles fracture of left radius in 2016,anal cancer who presents from nursing home for mental statuschangeafter being discovered for not coming down for breakfast.  Patient was noted to have acute renal failure and hyponatremia.  There was concern for a urinary tract infection.  Patient was hospitalized for further management. Per daughter-in-law patient has received 2 doses of Covid 19 vaccination.  Reason for Visit: Acute metabolic encephalopathy.  Acute kidney injury.  Consultants: None  Procedures: None  Antibiotics: Anti-infectives (From admission, onward)   Start     Dose/Rate Route Frequency Ordered Stop   03/30/20 2200  vancomycin (VANCOCIN) IVPB 1000 mg/200 mL premix        1,000 mg 200 mL/hr over 60 Minutes Intravenous Every 24 hours 03/30/20 0826     03/30/20 1600  ceFEPIme (MAXIPIME) 2 g in sodium chloride 0.9 % 100 mL IVPB        2 g 200 mL/hr over 30 Minutes Intravenous Every 24 hours 03/29/20 1637     03/29/20 1645  vancomycin variable dose per unstable renal function (pharmacist dosing)         Does not apply See admin instructions 03/29/20 1645     03/29/20 1630  ceFEPIme (MAXIPIME) 2 g in sodium chloride 0.9 % 100 mL IVPB        2 g 200 mL/hr over 30 Minutes Intravenous  Once 03/29/20 1617 03/29/20 1752   03/29/20 1630  metroNIDAZOLE (FLAGYL) IVPB 500 mg         500 mg 100 mL/hr over 60 Minutes Intravenous  Once 03/29/20 1617 03/29/20 1917   03/29/20 1630  vancomycin (VANCOCIN) IVPB 1000 mg/200 mL premix  Status:  Discontinued        1,000 mg 200 mL/hr over 60 Minutes Intravenous  Once 03/29/20 1617 03/29/20 1627   03/29/20 1630  vancomycin (VANCOREADY) IVPB 1750 mg/350 mL        1,750 mg 175 mL/hr over 120 Minutes Intravenous  Once 03/29/20 1627 03/29/20 2032      Subjective/Interval History: Patient remains lethargic though easily arousable.  Remains confused and distracted.      Assessment/Plan:  Sepsis, present on admission secondary to urinary source, with organ dysfunction Patient had elevated lactic acid level.  Initially 2.9.  Subsequently increased to 3.2 and then to 2.8.  Procalcitonin 0.47.  Patient getting broad-spectrum antibiotics currently, vancomycin and cefepime.  Sepsis physiology appears to have improved.  Continue current treatment.  Follow-up on cultures.   Urinary tract infection Abnormal UA noted.  Patient unable to mention if she has any symptoms however we are empirically treating her.  Follow-up on cultures.  Continues to have leukocytosis.  Acute kidney injury on chronic kidney disease stage IIIa/hypernatremia/hypokalemia Baseline creatinine is around 1.3.  Presented with creatinine of 2.8. Renal failure most likely due to hypovolemia in the setting of ARB use.   Patient was aggressively hydrated.  Renal function slowly improving.  Creatinine down to 1.57.  Monitor urine output. Patient also noted to have significantly elevated sodium level.  She was transitioned  to D5 infusion with improvement in sodium to 155 this morning.  Recheck this afternoon.   Acute metabolic encephalopathy Reason for her encephalopathy is multifactorial including sepsis, metabolic derangements, dehydration.  CT head did not show any acute findings.   Benjamine Mola more responsive today but remains confused.  No focal deficits  noted.  Transaminitis Mildly elevated AST and ALT noted.  Bilirubin and alkaline phosphatase is normal.  Noted to be stable.  Continue to monitor.  Check hepatitis panel.  Macrocytosis Folate 22.3.  B12 noted to be borderline low at 225.  TSH 0.756.  History of chronic systolic CHF She is status post ICD placement.  Echocardiogram available from 2017 which showed a EF of 30%.  Currently her cardiac medications are on hold as patient is encephalopathic and unable to take orally at this time and also due to her other acute medical issues.  She is noted to be on carvedilol, furosemide and losartan.  History of SVT Stable.  Monitor on telemetry.  History of obstructive sleep apnea on CPAP Continue CPAP.  Essential hypertension Monitor blood pressures closely.  Oral medications on hold.  Blood pressure stable  Thoracic ecchymosis No evidence for infection.  History of anxiety and depression Was on citalopram at home which is currently on hold.  History of dementia and memory disturbance On Namenda at home which can be resumed when she is more awake and alert.  History of restless leg syndrome Requip is on hold.  Obesity Estimated body mass index is 34.37 kg/m as calculated from the following:   Height as of this encounter: 5\' 3"  (1.6 m).   Weight as of this encounter: 88 kg.  DVT Prophylaxis: Subcutaneous heparin Code Status: DNR Family Communication: Son was updated yesterday.  Will do so again today. [place]. Disposition Plan: Hopefully return to SNF when improved.  Status is: Inpatient  Remains inpatient appropriate because:Altered mental status and IV treatments appropriate due to intensity of illness or inability to take PO   Dispo:  Patient From: [place]  Planned Disposition: [facility]  Expected discharge date: 04/02/20  Medically stable for discharge: No       Medications:  Scheduled: . heparin   5,000 Units Subcutaneous Q8H  . insulin aspart  0-15 Units Subcutaneous Q4H  . vancomycin variable dose per unstable renal function (pharmacist dosing)   Does not apply See admin instructions   Continuous: . ceFEPime (MAXIPIME) IV Stopped (03/30/20 2008)  . dextrose 5 % with KCl 20 mEq / L 20 mEq (03/31/20 0156)  . vancomycin 1,000 mg (03/30/20 2140)   PRN:   Objective:  Vital Signs  Vitals:   03/30/20 1945 03/30/20 2354 03/31/20 0452 03/31/20 0936  BP: 128/63 115/80 111/81 (!) 111/52  Pulse: 100 80 87 68  Resp: 18 18 18 17   Temp: 98.6 F (37 C) 98 F (36.7 C) 98.9 F (37.2 C) 98.1 F (36.7 C)  TempSrc: Oral Oral    SpO2:  98% 99% 100%  Weight:      Height:        Intake/Output Summary (Last 24 hours) at 03/31/2020 1158 Last data filed at 03/31/2020 0850 Gross per 24 hour  Intake 2537.69 ml  Output 0 ml  Net 2537.69 ml   Filed Weights   03/29/20 1815  Weight: 88 kg    General appearance: Noted to be somnolent.  Arousable.  Remains confused.  Distracted. Dry mucous membranes. Resp: Clear to auscultation bilaterally.  Normal effort Cardio: S1-S2  is normal regular.  No S3-S4.  No rubs murmurs or bruit GI: Abdomen is soft.  Nontender nondistended.  Bowel sounds are present normal.  No masses organomegaly Extremities: No edema.  Noted to be moving all her extremities Neurologic: disoriented.  No obvious focal deficits.   Lab Results:  Data Reviewed: I have personally reviewed following labs and imaging studies  CBC: Recent Labs  Lab 03/29/20 1507 03/30/20 0456 03/31/20 0231  WBC 15.1* 18.7* 17.0*  NEUTROABS 13.2*  --   --   HGB 14.7 13.9 13.9  HCT 49.3* 46.9* 46.2*  MCV 104.4* 106.8* 106.5*  PLT 187 166 106*    Basic Metabolic Panel: Recent Labs  Lab 03/29/20 1507 03/30/20 0456 03/30/20 1155 03/30/20 1815 03/31/20 0231  NA 161* 160* 161* 158* 155*  K 3.9 3.4* 3.6 3.9 4.0  CL 121* 124* 125* 124* 123*  CO2 23 20* 23 21* 18*  GLUCOSE 179*  193* 193* 192* 188*  BUN 60* 60* 59* 59* 55*  CREATININE 2.88* 2.28* 2.03* 1.97* 1.57*  CALCIUM 9.0 8.9 9.0 9.0 8.9    GFR: Estimated Creatinine Clearance: 31 mL/min (A) (by C-G formula based on SCr of 1.57 mg/dL (H)).  Liver Function Tests: Recent Labs  Lab 03/29/20 1507 03/30/20 0456 03/31/20 0231  AST 98* 76* 70*  ALT 96* 76* 65*  ALKPHOS 43 44 53  BILITOT 1.2 1.0 1.1  PROT 7.7 6.9 6.5  ALBUMIN 3.8 3.3* 3.0*     Coagulation Profile: Recent Labs  Lab 03/29/20 1507 03/30/20 0456  INR 1.3* 1.5*    CBG: Recent Labs  Lab 03/30/20 2059 03/31/20 0012 03/31/20 0449 03/31/20 0752 03/31/20 1123  GLUCAP 165* 151* 151* 189* 170*    Thyroid Function Tests: Recent Labs    03/31/20 0231  TSH 0.756    Anemia Panel: Recent Labs    03/31/20 0231  VITAMINB12 225  FOLATE 22.3    Recent Results (from the past 240 hour(s))  Culture, blood (Routine x 2)     Status: None (Preliminary result)   Collection Time: 03/29/20  3:21 PM   Specimen: BLOOD  Result Value Ref Range Status   Specimen Description BLOOD SITE NOT SPECIFIED  Final   Special Requests   Final    BOTTLES DRAWN AEROBIC AND ANAEROBIC Blood Culture adequate volume   Culture   Final    NO GROWTH < 24 HOURS Performed at [facility], [place] [address] [place], [place] [address]    Report Status PENDING  Incomplete  Culture, blood (Routine x 2)     Status: None (Preliminary result)   Collection Time: 03/29/20  4:39 PM   Specimen: BLOOD  Result Value Ref Range Status   Specimen Description BLOOD SITE NOT SPECIFIED  Final   Special Requests   Final    BOTTLES DRAWN AEROBIC AND ANAEROBIC Blood Culture adequate volume   Culture   Final    NO GROWTH < 24 HOURS Performed at [facility], [place] [address], [place], [place] [address]    Report Status PENDING  Incomplete  Respiratory Panel by RT PCR (Flu A&B, Covid) - Nasopharyngeal Swab     Status: None   Collection Time: 03/29/20  6:30 PM    Specimen: Nasopharyngeal Swab  Result Value Ref Range Status   SARS Coronavirus 2 by RT PCR NEGATIVE NEGATIVE Final    Comment: (NOTE) SARS-CoV-2 target nucleic acids are NOT DETECTED.  The SARS-CoV-2 RNA is generally detectable in upper respiratoy  specimens during the acute phase of infection. The lowest concentration of SARS-CoV-2 viral copies this assay can detect is 131 copies/mL. A negative result does not preclude SARS-Cov-2 infection and should not be used as the sole basis for treatment or other patient management decisions. A negative result may occur with  improper specimen collection/handling, submission of specimen other than nasopharyngeal swab, presence of viral mutation(s) within the areas targeted by this assay, and inadequate number of viral copies (<131 copies/mL). A negative result must be combined with clinical observations, patient history, and epidemiological information. The expected result is Negative.  Fact Sheet for Patients:  PinkCheek.be  Fact Sheet for Healthcare Providers:  GravelBags.it  This test is no t yet approved or cleared by the [place] FDA and  has been authorized for detection and/or diagnosis of SARS-CoV-2 by FDA under an Emergency Use Authorization (EUA). This EUA will remain  in effect (meaning this test can be used) for the duration of the COVID-19 declaration under Section 564(b)(1) of the Act, 21 U.S.C. section 360bbb-3(b)(1), unless the authorization is terminated or revoked sooner.     Influenza A by PCR NEGATIVE NEGATIVE Final   Influenza B by PCR NEGATIVE NEGATIVE Final    Comment: (NOTE) The Xpert Xpress SARS-CoV-2/FLU/RSV assay is intended as an aid in  the diagnosis of influenza from Nasopharyngeal swab specimens and  should not be used as a sole basis for treatment. Nasal washings and  aspirates are unacceptable for Xpert Xpress SARS-CoV-2/FLU/RSV   testing.  Fact Sheet for Patients: PinkCheek.be  Fact Sheet for Healthcare Providers: GravelBags.it  This test is not yet approved or cleared by the [place] FDA and  has been authorized for detection and/or diagnosis of SARS-CoV-2 by  FDA under an Emergency Use Authorization (EUA). This EUA will remain  in effect (meaning this test can be used) for the duration of the  Covid-19 declaration under Section 564(b)(1) of the Act, 21  U.S.C. section 360bbb-3(b)(1), unless the authorization is  terminated or revoked. Performed at [facility], [place] [address] [place], [place] [address]   Urine culture     Status: Abnormal   Collection Time: 03/29/20  7:29 PM   Specimen: In/Out Cath Urine  Result Value Ref Range Status   Specimen Description IN/OUT CATH URINE  Final   Special Requests   Final    NONE Performed at [facility], [place] [address], [place], [place] [address]    Culture 4,000 COLONIES/mL ESCHERICHIA COLI (A)  Final   Report Status 03/31/2020 FINAL  Final   Organism ID, Bacteria ESCHERICHIA COLI (A)  Final      Susceptibility   Escherichia coli - MIC*    AMPICILLIN >=32 RESISTANT Resistant     CEFAZOLIN <=4 SENSITIVE Sensitive     CEFTRIAXONE <=0.25 SENSITIVE Sensitive     CIPROFLOXACIN <=0.25 SENSITIVE Sensitive     GENTAMICIN >=16 RESISTANT Resistant     IMIPENEM <=0.25 SENSITIVE Sensitive     NITROFURANTOIN <=16 SENSITIVE Sensitive     TRIMETH/SULFA >=320 RESISTANT Resistant     AMPICILLIN/SULBACTAM 16 INTERMEDIATE Intermediate     PIP/TAZO <=4 SENSITIVE Sensitive     * 4,000 COLONIES/mL ESCHERICHIA COLI      Radiology Studies: CT HEAD WO CONTRAST  Result Date: 03/29/2020 CLINICAL DATA:  Mental status change. EXAM: CT HEAD WITHOUT CONTRAST TECHNIQUE: Contiguous axial images were obtained from the base of the skull through the vertex without intravenous contrast. COMPARISON:  CT head  September 06, 2017 FINDINGS: Brain: No evidence of acute large vascular territory infarction, hemorrhage, hydrocephalus, extra-axial collection or mass lesion/mass effect. Similar patchy white matter hypoattenuation, compatible with chronic microvascular ischemic disease. Similar diffuse cerebral volume loss with ex vacuo ventricular dilation. Partially empty sella. Vascular: Calcific atherosclerosis. Skull: Normal. Negative for fracture or focal lesion. Sinuses/Orbits: The sinuses are clear. No evidence of acute orbital abnormality. Other: No mastoid effusions. IMPRESSION: No evidence of acute intracranial abnormality. Electronically Signed   By: Margaretha Sheffield MD   On: 03/29/2020 18:25   DG Chest Portable 1 View  Result Date: 03/29/2020 CLINICAL DATA:  Altered mental status EXAM: PORTABLE CHEST 1 VIEW COMPARISON:  09/06/2017 FINDINGS: Cardiac shadow is mildly enlarged but stable. Defibrillator is again seen and stable. The lungs are well aerated bilaterally. No focal infiltrate or effusion is seen. No acute bony abnormality is noted. IMPRESSION: No acute abnormality seen. Electronically Signed   By: Inez Catalina M.D.   On: 03/29/2020 16:00       LOS: 2 days   [facility] Pager on [email or web address]  03/31/2020, 11:58 AM

## 2020-04-01 DIAGNOSIS — N390 Urinary tract infection, site not specified: Secondary | ICD-10-CM | POA: Diagnosis not present

## 2020-04-01 DIAGNOSIS — E870 Hyperosmolality and hypernatremia: Secondary | ICD-10-CM | POA: Diagnosis not present

## 2020-04-01 DIAGNOSIS — G9341 Metabolic encephalopathy: Secondary | ICD-10-CM | POA: Diagnosis not present

## 2020-04-01 DIAGNOSIS — N179 Acute kidney failure, unspecified: Secondary | ICD-10-CM | POA: Diagnosis not present

## 2020-04-01 LAB — COMPREHENSIVE METABOLIC PANEL
ALT: 57 U/L — ABNORMAL HIGH (ref 0–44)
AST: 63 U/L — ABNORMAL HIGH (ref 15–41)
Albumin: 2.5 g/dL — ABNORMAL LOW (ref 3.5–5.0)
Alkaline Phosphatase: 50 U/L (ref 38–126)
Anion gap: 8 (ref 5–15)
BUN: 32 mg/dL — ABNORMAL HIGH (ref 8–23)
CO2: 23 mmol/L (ref 22–32)
Calcium: 8.7 mg/dL — ABNORMAL LOW (ref 8.9–10.3)
Chloride: 119 mmol/L — ABNORMAL HIGH (ref 98–111)
Creatinine, Ser: 1.25 mg/dL — ABNORMAL HIGH (ref 0.44–1.00)
GFR, Estimated: 41 mL/min — ABNORMAL LOW (ref >60)
Glucose, Bld: 162 mg/dL — ABNORMAL HIGH (ref 70–99)
Potassium: 4.1 mmol/L (ref 3.5–5.1)
Sodium: 150 mmol/L — ABNORMAL HIGH (ref 135–145)
Total Bilirubin: 0.9 mg/dL (ref 0.3–1.2)
Total Protein: 6.1 g/dL — ABNORMAL LOW (ref 6.5–8.1)

## 2020-04-01 LAB — CBC
HCT: 39.6 % (ref 36.0–46.0)
Hemoglobin: 12.1 g/dL (ref 12.0–15.0)
MCH: 31 pg (ref 26.0–34.0)
MCHC: 30.6 g/dL (ref 30.0–36.0)
MCV: 101.5 fL — ABNORMAL HIGH (ref 80.0–100.0)
Platelets: 129 10*3/uL — ABNORMAL LOW (ref 150–400)
RBC: 3.9 MIL/uL (ref 3.87–5.11)
RDW: 13.5 % (ref 11.5–15.5)
WBC: 12.3 10*3/uL — ABNORMAL HIGH (ref 4.0–10.5)
nRBC: 0 % (ref 0.0–0.2)

## 2020-04-01 LAB — GLUCOSE, CAPILLARY
Glucose-Capillary: 148 mg/dL — ABNORMAL HIGH (ref 70–99)
Glucose-Capillary: 144 mg/dL — ABNORMAL HIGH (ref 70–99)
Glucose-Capillary: 167 mg/dL — ABNORMAL HIGH (ref 70–99)
Glucose-Capillary: 102 mg/dL — ABNORMAL HIGH (ref 70–99)
Glucose-Capillary: 192 mg/dL — ABNORMAL HIGH (ref 70–99)

## 2020-04-01 LAB — HEMOGLOBIN A1C
Hgb A1c MFr Bld: 6.6 % — ABNORMAL HIGH (ref 4.8–5.6)
Mean Plasma Glucose: 143 mg/dL

## 2020-04-01 LAB — HEPATITIS PANEL, ACUTE
HCV Ab: NONREACTIVE
Hep A IgM: NONREACTIVE
Hep B C IgM: NONREACTIVE
Hepatitis B Surface Ag: NONREACTIVE

## 2020-04-01 MED ORDER — INSULIN ASPART 100 UNIT/ML ~~LOC~~ SOLN
0.0000 [IU] | Freq: Three times a day (TID) | SUBCUTANEOUS | Status: DC
  Administered 2020-04-02 (×2): 2 [IU] via SUBCUTANEOUS
  Administered 2020-04-02 – 2020-04-03 (×3): 1 [IU] via SUBCUTANEOUS
  Administered 2020-04-03 – 2020-04-04 (×2): 2 [IU] via SUBCUTANEOUS
  Administered 2020-04-05 (×2): 1 [IU] via SUBCUTANEOUS
  Administered 2020-04-06: 2 [IU] via SUBCUTANEOUS

## 2020-04-01 MED ORDER — VITAMIN B-12 100 MCG PO TABS
500.0000 ug | ORAL_TABLET | Freq: Every day | ORAL | Status: DC
  Administered 2020-04-01 – 2020-04-06 (×6): 500 ug via ORAL
  Filled 2020-04-01 (×6): qty 5

## 2020-04-01 MED ORDER — POTASSIUM CL IN DEXTROSE 5% 20 MEQ/L IV SOLN
20.0000 meq | INTRAVENOUS | Status: DC
  Administered 2020-04-01 (×2): 20 meq via INTRAVENOUS
  Filled 2020-04-01: qty 1000

## 2020-04-01 NOTE — Plan of Care (Signed)
  Problem: Safety: Goal: Ability to remain free from injury will improve Outcome: Progressing   

## 2020-04-01 NOTE — Evaluation (Signed)
Physical Therapy Evaluation Patient Details Name: Samantha [name] MRN: [id number] DOB: 11/29/1940 Today's Date: 04/01/2020   History of Present Illness  The pt is a 79 yo female presenting from [facility] independent living facility with AMS. Upon work-up, pt found to have UTI, sepsis, and AKI. PMH includes: CHF with EF of 30%, AICD, renal artery aneurysm, CKD III HTN, colon cancer, OSA on CPAP, frequent UTI, and dementia.   Clinical Impression  Pt in bed upon arrival of PT, initially agreeable to evaluation at this time. The pt was unable to detail her prior level of function or mobility, but per the pt's chart she was living in ILF and independent with mobility and ADLs without AD. The pt now presents with limitations in functional mobility, strength, stability, and activity tolerance due to above dx, and will continue to benefit from skilled PT to address these deficits. A full assessment of the pt's strength and OOB mobility was limited by her poor command following and inability to be redirected at this time. The pt required minA to reach sitting at EOB, but was unable to progress to OOB transfers or attempt gait prior to pt impulsively laying back into bed where she again required assist to return LE to bed and to reposition. The pt reported fatigue when asked, but otherwise gave inconsistent answers to questions, with tangential, incoherent, and mumbled speech. The pt will continue to benefit from skilled PT to progress functional transfers and OOB mobility, but based on today's presentation, may need continued rehab prior to return to her ILF.      Follow Up Recommendations SNF;Supervision/Assistance - 24 hour    Equipment Recommendations   (defer to post acute)    Recommendations for Other Services       Precautions / Restrictions Precautions Precautions: Fall Precaution Comments: dementia at baseline, AMS Restrictions Weight Bearing Restrictions: No      Mobility  Bed  Mobility Overal bed mobility: Needs Assistance Bed Mobility: Supine to Sit;Sit to Supine     Supine to sit: Min assist Sit to supine: Min assist   General bed mobility comments: minA to initiate movement of BLE to EOB, then minA to elevate trunk from bed, the pt was able to sustatin with minG for safety sitting EOB, but then declined further mobility and activity  Transfers                 General transfer comment: pt declined OOB transfers     Balance Overall balance assessment: Needs assistance Sitting-balance support: Single extremity supported Sitting balance-Leahy Scale: Fair Sitting balance - Comments: single UE support sitting EOB, pt with slight impulsivity, unpredictability requireing increased assist for safety                                     Pertinent Vitals/Pain Pain Assessment: No/denies pain    Home Living Family/patient expects to be discharged to:: Skilled nursing facility                 Additional Comments: from [facility]    Prior Function Level of Independence: Independent         Comments: pt unable to state during session, but per chart review the pt was living at ILF and was independent with ADLs and mobility without use of AD     Hand Dominance        Extremity/Trunk Assessment  Upper Extremity Assessment Upper Extremity Assessment: Defer to OT evaluation    Lower Extremity Assessment Lower Extremity Assessment: Difficult to assess due to impaired cognition (very poor command following, and pt declined OOB)       Communication   Communication: Expressive difficulties (at times mumbled, inchoherent speech)  Cognition Arousal/Alertness: Awake/alert Behavior During Therapy: Restless Overall Cognitive Status: Impaired/Different from baseline Area of Impairment: Orientation;Attention;Memory;Following commands;Safety/judgement;Problem solving                 Orientation  Level: Disoriented to;Place;Time;Situation Current Attention Level: Focused Memory: Decreased recall of precautions;Decreased short-term memory Following Commands: Follows one step commands inconsistently Safety/Judgement: Decreased awareness of deficits;Decreased awareness of safety   Problem Solving: Slow processing;Difficulty sequencing;Requires verbal cues General Comments: Pt responsive, but with nonsensical or mumbled responses to most questions, difficult to redirect, pt actively hallucinating bees or bats in room during session      General Comments General comments (skin integrity, edema, etc.): VSS on RA, pt with tangential and nonsensical responses through session         Assessment/Plan    PT Assessment Patient needs continued PT services  PT Problem List Decreased strength;Decreased activity tolerance;Decreased balance;Decreased mobility;Decreased cognition;Decreased knowledge of precautions;Decreased safety awareness       PT Treatment Interventions DME instruction;Gait training;Stair training;Functional mobility training;Therapeutic activities;Therapeutic exercise;Balance training;Patient/family education    PT Goals (Current goals can be found in the Care Plan section)  Acute Rehab PT Goals Patient Stated Goal: to go to the beach PT Goal Formulation: Patient unable to participate in goal setting Time For Goal Achievement: 04/15/20 Potential to Achieve Goals: Fair    Frequency Min 2X/week   Barriers to discharge Decreased caregiver support pt from independent living, will need 24/7 supervision       AM-PAC PT "6 Clicks" Mobility  Outcome Measure Help needed turning from your back to your side while in a flat bed without using bedrails?: A Little Help needed moving from lying on your back to sitting on the side of a flat bed without using bedrails?: A Little Help needed moving to and from a bed to a chair (including a wheelchair)?: A Lot Help needed standing  up from a chair using your arms (e.g., wheelchair or bedside chair)?: A Little Help needed to walk in hospital room?: A Lot Help needed climbing 3-5 steps with a railing? : Total 6 Click Score: 14    End of Session   Activity Tolerance: Other (comment) (limited by AMS, pt resistant to OOB despite various attempts) Patient left: in bed;with bed alarm set;with restraints reapplied (bilateral mitts) Nurse Communication: Mobility status PT Visit Diagnosis: Difficulty in walking, not elsewhere classified (R26.2)    Time: 7414-2395 PT Time Calculation (min) (ACUTE ONLY): 28 min   Charges:   PT Evaluation $PT Eval Moderate Complexity: 1 Mod PT Treatments $Therapeutic Activity: 8-22 mins        Karma Ganja, PT, DPT   Acute Rehabilitation Department Pager #: [phone]  Otho Bellows 04/01/2020, 4:51 PM

## 2020-04-01 NOTE — Care Management Important Message (Signed)
Important Message  Patient Details  Name: Samantha [name] MRN: [id number] Date of Birth: 06-17-1941   Medicare Important Message Given:  Yes - Important Message mailed due to current National Emergency  Verbal consent obtained due to current National Emergency  Relationship to patient: Child Contact Name: Verline Lema Call Date: 04/01/20  Time: 1040 Phone: [phone] Outcome: No Answer/Busy Important Message mailed to: Patient address on file    Delorse Lek 04/01/2020, 10:40 AM

## 2020-04-01 NOTE — TOC Initial Note (Addendum)
Transition of Care [facility]) - Initial/Assessment Note  *Son Samantha [name] is primary contact - [phone]   Patient Details  Name: Samantha [name] MRN: [id number] Date of Birth: 01/15/1941  Transition of Care [facility]) CM/SW Contact:    Sable Feil, LCSW Phone Number: 04/01/2020, 4:03 PM  Clinical Narrative: CSW visited room and attempted to talk with patient. [name] was lying in bed with her eyes closed, but opened her eyes when her named was called. CSW introduced self and stated purpose of visit. Patient was verbal but incoherent. Call made to her son Samantha [name] - [phone] (MD contacted son with updates on patient). Mr. Samantha [name] responded when asked, that he is the primary contact and he is keeping his sister Samantha [name] his wife is  Samantha [name] updated. He added that his wife is an Therapist, sports, so she assists in explaining what the doctor says. CSW informed that his mother has been at [facility] in [place] for 13 months and was independent with walking and ADL's before coming to the hospital.   CSW explained the facility search process, Medicare.gov for facility information and that he will be contacted once responses are received. Mr. Rickenbach also advised that the doctor put in PT/OT orders today and his mother had not been seen by them yet. Son was advised that if he did not like any of the facilities that could take his mother, she could return to the facility with [facility] services, and she might need an aide (out-of-pocket expense) to ensure his mother's safety for a period of time post-discharge, and son expressed understanding. When asked, son reported that his mother has had the COVID vaccinations.                 Expected Discharge Plan: Skilled Nursing Facility Barriers to Discharge: Continued Medical Work up (PT/OT ordered but has not evaluated patient yet)   Patient Goals and CMS Choice Patient states their goals for this hospitalization and ongoing recovery  are:: Son in agreement with ST rehab if recommended and if he is comfortable with any of the SNF selections CMS Medicare.gov Compare Post Acute Care list provided to:: Other (Comment Required) (Son informed about [email or web address]) Choice offered to / list presented to : Adult Children (Son Samantha [name])  Expected Discharge Plan and Services Expected Discharge Plan: [place] In-house Referral: Clinical Social Work     Living arrangements for the past 2 months: [place] [employer])                                     Prior Living Arrangements/Services Living arrangements for the past 2 months: [place] [employer]) Lives with:: Facility Resident ([place]) Patient language and need for interpreter reviewed:: No Do you feel safe going back to the place where you live?: Yes (Son advised that if he does not accept any of the facilities that are offered, patient can return to [place] with [facility] services)      Need for Family Participation in Patient Care: Yes (Comment) Care giver support system in place?: Yes (comment)   Criminal Activity/Legal Involvement Pertinent to Current Situation/Hospitalization: No - Comment as needed  Activities of Daily Living      Permission Sought/Granted Permission sought to share information with : Family Supports Permission granted to share information with : Yes, Verbal Permission Granted (When patient informed her son would be called she  said yes)  Share Information with NAME: Juanda Luba     Permission granted to share info w Relationship: Son  Permission granted to share info w Contact Information: [phone]  Emotional Assessment Appearance:: Appears stated age Attitude/Demeanor/Rapport: Other (comment) (Patient awake, but incoherent in speech) Affect (typically observed): Other (comment) (Awake, alert, confused) Orientation: : Oriented to Self Alcohol / Substance Use: Tobacco  Use, Alcohol Use, Illicit Drugs (Per H&P, patient reported that she has never smoked and does not drink alcohol or use illicit drugs) Psych Involvement: No (comment)  Admission diagnosis:  Hypernatremia [E87.0] AKI (acute kidney injury) ([place]) [N17.9] Sepsis ([place]) [A41.9] Urinary tract infection without hematuria, site unspecified [N39.0] Sepsis with acute renal failure without septic shock, due to unspecified organism, unspecified acute renal failure type ([place]) [A41.9, R65.20, N17.9] Patient Active Problem List   Diagnosis Date Noted  . Sepsis ([place]) 03/29/2020  . Traumatic ecchymosis of left thoracic region 03/29/2020  . AKI (acute kidney injury) ([place])   . Hypernatremia   . Urinary tract infection without hematuria   . ICD (implantable cardioverter-defibrillator) in place 07/16/2019  . Chronic systolic CHF (congestive heart failure) ([place]) 06/15/2016  . RLS (restless legs syndrome) 07/07/2015  . Restless leg syndrome 12/30/2014  . Anemia, iron deficiency 11/25/2014  . OSA on CPAP 11/25/2014  . Fatigue due to sleep pattern disturbance 11/25/2014  . Restless legs syndrome (RLS) 11/24/2012  . Essential and other specified forms of tremor 11/24/2012  . Anal cancer ([place]) 10/05/2011  . SBO (small bowel obstruction) ([place]) 06/05/2011  . HTN (hypertension) 02/07/2011  . Abnormal EKG 11/27/2010  . OSA (obstructive sleep apnea) 10/10/2010  . Chronic systolic congestive heart failure ([place]) 09/29/2010  . Left bundle branch block 09/29/2010   PCP:  Tisovec, Fransico Him, MD Pharmacy:   [id number] - [place], [place] [address] Phone: [phone] Fax: [phone]     Social Determinants of Health (SDOH) Interventions  No SDOH interventions requested or needed at this time   Readmission Risk Interventions No flowsheet data found.

## 2020-04-01 NOTE — Progress Notes (Signed)
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  Patient was noted to have acute renal failure and hyponatremia.  There was concern for a urinary tract infection.  Patient was hospitalized for further management. Per daughter-in-law patient has received 2 doses of Covid 19 vaccination.  Reason for Visit: Acute metabolic encephalopathy.  Acute kidney injury.  Consultants: None  Procedures: None  Antibiotics: Anti-infectives (From admission, onward)   Start     Dose/Rate Route Frequency Ordered Stop   03/31/20 1445  cefTRIAXone (ROCEPHIN) 1 g in sodium chloride 0.9 % 100 mL IVPB        1 g 200 mL/hr over 30 Minutes Intravenous Every 24 hours 03/31/20 1346     03/30/20 2200  vancomycin (VANCOCIN) IVPB 1000 mg/200 mL premix  Status:  Discontinued        1,000 mg 200 mL/hr over 60 Minutes Intravenous Every 24 hours 03/30/20 0826 03/31/20 1346   03/30/20 1600  ceFEPIme (MAXIPIME) 2 g in sodium chloride 0.9 % 100 mL IVPB  Status:  Discontinued        2 g 200 mL/hr over 30 Minutes Intravenous Every 24 hours 03/29/20 1637 03/31/20 1346   03/29/20 1645  vancomycin variable dose per unstable renal function (pharmacist dosing)  Status:  Discontinued         Does not apply See admin  instructions 03/29/20 1645 03/31/20 1346   03/29/20 1630  ceFEPIme (MAXIPIME) 2 g in sodium chloride 0.9 % 100 mL IVPB        2 g 200 mL/hr over 30 Minutes Intravenous  Once 03/29/20 1617 03/29/20 1752   03/29/20 1630  metroNIDAZOLE (FLAGYL) IVPB 500 mg        500 mg 100 mL/hr over 60 Minutes Intravenous  Once 03/29/20 1617 03/29/20 1917   03/29/20 1630  vancomycin (VANCOCIN) IVPB 1000 mg/200 mL premix  Status:  Discontinued        1,000 mg 200 mL/hr over 60 Minutes Intravenous  Once 03/29/20 1617 03/29/20 1627   03/29/20 1630  vancomycin (VANCOREADY) IVPB 1750 mg/350 mL        1,750 mg 175 mL/hr over 120 Minutes Intravenous  Once 03/29/20 1627 03/29/20 2032      Subjective/Interval History: Patient noted to be awake and alert today.  Working with speech therapy.  Still remains confused but answering a lot of questions appropriately.      Assessment/Plan:  Sepsis, present on admission secondary to urinary source, with organ dysfunction Patient had elevated lactic acid level.  Initially 2.9.  Subsequently increased to 3.2 and then to 2.8.  Procalcitonin 0.47.    Patient was given broad-spectrum antibiotics.  Cultures were negative.  Vancomycin was discontinued.  Cefepime was changed over to ceftriaxone yesterday.  Sepsis physiology has resolved.  Mentation is improving.  Continue to monitor.    Urinary tract infection Abnormal UA was noted.  Urine culture with only 4000 colonies of E. coli.  Blood cultures  negative.  Continue ceftriaxone.  Mentation is improving.  WBC seems to be better as well.    Acute kidney injury on chronic kidney disease stage IIIa/hypernatremia/hypokalemia Baseline creatinine is around 1.3.  Presented with creatinine of 2.8. Renal failure most likely due to hypovolemia in the setting of ARB use.   Patient was aggressively hydrated.    Renal function improved and now back to baseline.  Monitor urine output.  Cut back on IV fluids.   Sodium level has improved  to 150.  Continue D5 infusion for now.  Reduce the rate.  Acute metabolic encephalopathy Reason for her encephalopathy is multifactorial including sepsis, metabolic derangements, dehydration.  CT head did not show any acute findings.   Patient seems to be more responsive and alert today.  No focal deficits noted.  Hopefully she will continue to improve.    Transaminitis Mildly elevated AST and ALT noted.  Bilirubin and alkaline phosphatase is normal.    LFTs are stable.  Continue to monitor.    Hepatitis panel negative.  Macrocytosis Folate 22.3.  B12 noted to be borderline low at 225.  TSH 0.756.  Vitamin B12 deficiency B12 level noted to be borderline low at 225.  She would benefit from supplementation.  History of chronic systolic CHF She is status post ICD placement.  Echocardiogram available from 2017 which showed a EF of 30%.  Currently her cardiac medications are on hold as patient is encephalopathic and unable to take orally at this time and also due to her other acute medical issues.  She is noted to be on carvedilol, furosemide and losartan. Resume when she is able to take orally.  History of SVT Stable.  Monitor on telemetry.  History of obstructive sleep apnea on CPAP Continue CPAP.  Essential hypertension Blood pressure is well-controlled.  No oral medications on hold.  Continue to monitor.    Thoracic ecchymosis No evidence for infection.  History of anxiety and depression Was on citalopram at home which is currently on hold.  History of dementia and memory disturbance On Namenda at home.  Resume when able to take orally.  History of restless leg syndrome Requip is on hold.  Obesity Estimated body mass index is 34.37 kg/m as calculated from the following:   Height as of this encounter: 5\' 3"  (1.6 m).   Weight as of this encounter: 88 kg.  DVT Prophylaxis: Subcutaneous heparin Code Status: DNR Family Communication:  Son being updated daily.   [place]. Disposition Plan: Hopefully return to SNF when improved.  Status is: Inpatient  Remains inpatient appropriate because:Altered mental status and IV treatments appropriate due to intensity of illness or inability to take PO   Dispo:  Patient From: [place]  Planned Disposition: [place]  Expected discharge date: 04/02/20  Medically stable for discharge: No       Medications:  Scheduled: . heparin  5,000 Units Subcutaneous Q8H  . insulin aspart  0-15 Units Subcutaneous Q4H   Continuous: . cefTRIAXone (ROCEPHIN)  IV 1 g (03/31/20 1653)  . dextrose 5 % with KCl 20 mEq / L 20 mEq (04/01/20 0801)   PRN:   Objective:  Vital Signs  Vitals:   03/31/20 1753 03/31/20 2012 04/01/20 0431 04/01/20 0935  BP: 122/77 105/63 104/85 110/69  Pulse: 78 99 88 81  Resp: 18 16 17 18   Temp: 98.2 F (36.8 C) 98.8 F (37.1 C) 98.3 F (36.8 C) 98.4 F (36.9 C)  TempSrc:  Oral Oral  SpO2: 99% 98% 97% 96%  Weight:  83.7 kg    Height:        Intake/Output Summary (Last 24 hours) at 04/01/2020 1156 Last data filed at 04/01/2020 1017 Gross per 24 hour  Intake 2240.25 ml  Output 500 ml  Net 1740.25 ml   Filed Weights   03/29/20 1815 03/31/20 2012  Weight: 88 kg 83.7 kg    General appearance: Much more awake and alert today compared to yesterday.  Answering questions. Resp: Clear to auscultation bilaterally.  Normal effort Cardio: S1-S2 is normal regular.  No S3-S4.  No rubs murmurs or bruit GI: Abdomen is soft.  Nontender nondistended.  Bowel sounds are present normal.  No masses organomegaly Extremities: No edema.  Generalized deconditioning noted.  Able to move all her extremities. Neurologic: Alert.  Oriented to place, city, month.  She did not get the year correctly.  She did know the name of the president.  No obvious focal deficits.    Lab Results:  Data Reviewed: I have personally reviewed following labs and imaging  studies  CBC: Recent Labs  Lab 03/29/20 1507 03/30/20 0456 03/31/20 0231 04/01/20 0335  WBC 15.1* 18.7* 17.0* 12.3*  NEUTROABS 13.2*  --   --   --   HGB 14.7 13.9 13.9 12.1  HCT 49.3* 46.9* 46.2* 39.6  MCV 104.4* 106.8* 106.5* 101.5*  PLT 187 166 106* 129*    Basic Metabolic Panel: Recent Labs  Lab 03/30/20 1155 03/30/20 1815 03/31/20 0231 03/31/20 1523 04/01/20 0335  NA 161* 158* 155* 153* 150*  K 3.6 3.9 4.0 3.8 4.1  CL 125* 124* 123* 120* 119*  CO2 23 21* 18* 24 23  GLUCOSE 193* 192* 188* 176* 162*  BUN 59* 59* 55* 39* 32*  CREATININE 2.03* 1.97* 1.57* 1.30* 1.25*  CALCIUM 9.0 9.0 8.9 8.6* 8.7*    GFR: Estimated Creatinine Clearance: 38 mL/min (A) (by C-G formula based on SCr of 1.25 mg/dL (H)).  Liver Function Tests: Recent Labs  Lab 03/29/20 1507 03/30/20 0456 03/31/20 0231 04/01/20 0335  AST 98* 76* 70* 63*  ALT 96* 76* 65* 57*  ALKPHOS 43 44 53 50  BILITOT 1.2 1.0 1.1 0.9  PROT 7.7 6.9 6.5 6.1*  ALBUMIN 3.8 3.3* 3.0* 2.5*     Coagulation Profile: Recent Labs  Lab 03/29/20 1507 03/30/20 0456  INR 1.3* 1.5*    CBG: Recent Labs  Lab 03/31/20 2006 03/31/20 2342 04/01/20 0427 04/01/20 0736 04/01/20 1122  GLUCAP 133* 156* 148* 144* 167*    Thyroid Function Tests: Recent Labs    03/31/20 0231  TSH 0.756    Anemia Panel: Recent Labs    03/31/20 0231  VITAMINB12 225  FOLATE 22.3    Recent Results (from the past 240 hour(s))  Culture, blood (Routine x 2)     Status: None (Preliminary result)   Collection Time: 03/29/20  3:21 PM   Specimen: BLOOD  Result Value Ref Range Status   Specimen Description BLOOD SITE NOT SPECIFIED  Final   Special Requests   Final    BOTTLES DRAWN AEROBIC AND ANAEROBIC Blood Culture adequate volume   Culture   Final    NO GROWTH 3 DAYS Performed at [facility], [place] [address] [place], [place] [address]    Report Status PENDING  Incomplete  Culture, blood (Routine x 2)     Status: None  (Preliminary result)   Collection Time: 03/29/20  4:39 PM   Specimen: BLOOD  Result  Value Ref Range Status   Specimen Description BLOOD SITE NOT SPECIFIED  Final   Special Requests   Final    BOTTLES DRAWN AEROBIC AND ANAEROBIC Blood Culture adequate volume   Culture   Final    NO GROWTH 3 DAYS Performed at [facility], [address], [place], [place] [address]    Report Status PENDING  Incomplete  Respiratory Panel by RT PCR (Flu A&B, Covid) - Nasopharyngeal Swab     Status: None   Collection Time: 03/29/20  6:30 PM   Specimen: Nasopharyngeal Swab  Result Value Ref Range Status   SARS Coronavirus 2 by RT PCR NEGATIVE NEGATIVE Final    Comment: (NOTE) SARS-CoV-2 target nucleic acids are NOT DETECTED.  The SARS-CoV-2 RNA is generally detectable in upper respiratoy specimens during the acute phase of infection. The lowest concentration of SARS-CoV-2 viral copies this assay can detect is 131 copies/mL. A negative result does not preclude SARS-Cov-2 infection and should not be used as the sole basis for treatment or other patient management decisions. A negative result may occur with  improper specimen collection/handling, submission of specimen other than nasopharyngeal swab, presence of viral mutation(s) within the areas targeted by this assay, and inadequate number of viral copies (<131 copies/mL). A negative result must be combined with clinical observations, patient history, and epidemiological information. The expected result is Negative.  Fact Sheet for Patients:  PinkCheek.be  Fact Sheet for Healthcare Providers:  GravelBags.it  This test is no t yet approved or cleared by the [place] FDA and  has been authorized for detection and/or diagnosis of SARS-CoV-2 by FDA under an Emergency Use Authorization (EUA). This EUA will remain  in effect (meaning this test can be used) for the duration of  the COVID-19 declaration under Section 564(b)(1) of the Act, 21 U.S.C. section 360bbb-3(b)(1), unless the authorization is terminated or revoked sooner.     Influenza A by PCR NEGATIVE NEGATIVE Final   Influenza B by PCR NEGATIVE NEGATIVE Final    Comment: (NOTE) The Xpert Xpress SARS-CoV-2/FLU/RSV assay is intended as an aid in  the diagnosis of influenza from Nasopharyngeal swab specimens and  should not be used as a sole basis for treatment. Nasal washings and  aspirates are unacceptable for Xpert Xpress SARS-CoV-2/FLU/RSV  testing.  Fact Sheet for Patients: PinkCheek.be  Fact Sheet for Healthcare Providers: GravelBags.it  This test is not yet approved or cleared by the [place] FDA and  has been authorized for detection and/or diagnosis of SARS-CoV-2 by  FDA under an Emergency Use Authorization (EUA). This EUA will remain  in effect (meaning this test can be used) for the duration of the  Covid-19 declaration under Section 564(b)(1) of the Act, 21  U.S.C. section 360bbb-3(b)(1), unless the authorization is  terminated or revoked. Performed at [facility], [place] [address], [place], [place] [address]   Urine culture     Status: Abnormal   Collection Time: 03/29/20  7:29 PM   Specimen: In/Out Cath Urine  Result Value Ref Range Status   Specimen Description IN/OUT CATH URINE  Final   Special Requests   Final    NONE Performed at [facility], [place] [address] [place], [place] [address]    Culture 4,000 COLONIES/mL ESCHERICHIA COLI (A)  Final   Report Status 03/31/2020 FINAL  Final   Organism ID, Bacteria ESCHERICHIA COLI (A)  Final      Susceptibility   Escherichia coli - MIC*  AMPICILLIN >=32 RESISTANT Resistant     CEFAZOLIN <=4 SENSITIVE Sensitive     CEFTRIAXONE <=0.25 SENSITIVE Sensitive     CIPROFLOXACIN <=0.25 SENSITIVE Sensitive     GENTAMICIN >=16 RESISTANT Resistant     IMIPENEM  <=0.25 SENSITIVE Sensitive     NITROFURANTOIN <=16 SENSITIVE Sensitive     TRIMETH/SULFA >=320 RESISTANT Resistant     AMPICILLIN/SULBACTAM 16 INTERMEDIATE Intermediate     PIP/TAZO <=4 SENSITIVE Sensitive     * 4,000 COLONIES/mL ESCHERICHIA COLI  Blood culture (routine single)     Status: None (Preliminary result)   Collection Time: 03/30/20  4:56 AM   Specimen: BLOOD LEFT HAND  Result Value Ref Range Status   Specimen Description BLOOD LEFT HAND  Final   Special Requests   Final    BOTTLES DRAWN AEROBIC AND ANAEROBIC Blood Culture adequate volume   Culture   Final    NO GROWTH 2 DAYS Performed at [facility], [address] [place], [place] [address]    Report Status PENDING  Incomplete      Radiology Studies: No results found.     LOS: 3 days   Ahad Colarusso [employer] on [email or web address]  04/01/2020, 11:56 AM

## 2020-04-01 NOTE — Progress Notes (Signed)
Patient in [place] soft Hands Mitts to disrupt pt from pulling out IVs and causing injury to self due to confusion. Will continue to monitor.   Khylie Larmore,RN.

## 2020-04-01 NOTE — Progress Notes (Signed)
  Speech Language Pathology Treatment: Dysphagia  Patient Details Name: Samantha [name] MRN: [id number] DOB: 02/13/1941 Today's Date: 04/01/2020 Time: 5056-9794 SLP Time Calculation (min) (ACUTE ONLY): 27 min  Assessment / Plan / Recommendation Clinical Impression  Pt's mentation is markedly improved today in comparison to previous treatment session and initial evaluation.  Pt was easy to awaken and remained alert throughout treatment session.  Pt was initially disoriented to place, date, and situation but was able to retain orientation information to place and date with periodic repetition of information.  Pt was able to feed herself presentations of dys 1 and regular textures in addition to straw sips of thin liquids with max faded to mod assist verbal cues.  Her oral phase in response to pureed textures and straw sips of thin liquids was timely and efficient but she exhibited prolonged bolus formation and oral holding with solid textures.  Pt's swallowing mechanism appears intact but is impacted by decreased attention to boluses.  As a result, I recommend that pt be initiated on a conservative diet of dys 1 textures and thin liquids.  I suspect that pt will be able to be advanced to more solid textures quickly as long as mentation continues to clear.  Pt was left in bed with bilateral safety mitts on, bed alarm set, and call bell within reach.  Continue per current plan of care.    HPI HPI: 79 y.o. 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  Dx sepsis, UTI, AKI, metabolic encephalopathy.       SLP Plan  Continue with current plan of care       Recommendations  Diet recommendations: Dysphagia 1 (puree);Thin liquid Liquids provided via: Straw;Cup Medication Administration: Crushed with puree Supervision: Full supervision/cueing for compensatory strategies Compensations: Minimize environmental distractions Postural Changes and/or Swallow Maneuvers: Out of bed for meals;Seated upright 90 degrees;Upright 30-60 min after meal                Oral Care Recommendations: Oral care BID Follow up Recommendations: Other (comment) (pending progress made while inpatient) SLP Visit Diagnosis: Dysphagia, oral phase (R13.11) Plan: Continue with current plan of care       GO                Samantha [name], Selinda Orion 04/01/2020, 9:35 AM

## 2020-04-02 DIAGNOSIS — G9341 Metabolic encephalopathy: Secondary | ICD-10-CM | POA: Diagnosis not present

## 2020-04-02 DIAGNOSIS — N390 Urinary tract infection, site not specified: Secondary | ICD-10-CM | POA: Diagnosis not present

## 2020-04-02 LAB — COMPREHENSIVE METABOLIC PANEL
ALT: 53 U/L — ABNORMAL HIGH (ref 0–44)
AST: 54 U/L — ABNORMAL HIGH (ref 15–41)
Albumin: 2.4 g/dL — ABNORMAL LOW (ref 3.5–5.0)
Alkaline Phosphatase: 59 U/L (ref 38–126)
Anion gap: 9 (ref 5–15)
BUN: 25 mg/dL — ABNORMAL HIGH (ref 8–23)
CO2: 22 mmol/L (ref 22–32)
Calcium: 8.5 mg/dL — ABNORMAL LOW (ref 8.9–10.3)
Chloride: 114 mmol/L — ABNORMAL HIGH (ref 98–111)
Creatinine, Ser: 1.04 mg/dL — ABNORMAL HIGH (ref 0.44–1.00)
GFR, Estimated: 51 mL/min — ABNORMAL LOW (ref >60)
Glucose, Bld: 159 mg/dL — ABNORMAL HIGH (ref 70–99)
Potassium: 4.4 mmol/L (ref 3.5–5.1)
Sodium: 145 mmol/L (ref 135–145)
Total Bilirubin: 1 mg/dL (ref 0.3–1.2)
Total Protein: 5.8 g/dL — ABNORMAL LOW (ref 6.5–8.1)

## 2020-04-02 LAB — GLUCOSE, CAPILLARY
Glucose-Capillary: 122 mg/dL — ABNORMAL HIGH (ref 70–99)
Glucose-Capillary: 126 mg/dL — ABNORMAL HIGH (ref 70–99)
Glucose-Capillary: 141 mg/dL — ABNORMAL HIGH (ref 70–99)
Glucose-Capillary: 157 mg/dL — ABNORMAL HIGH (ref 70–99)
Glucose-Capillary: 165 mg/dL — ABNORMAL HIGH (ref 70–99)
Glucose-Capillary: 175 mg/dL — ABNORMAL HIGH (ref 70–99)

## 2020-04-02 LAB — CBC
HCT: 37.7 % (ref 36.0–46.0)
Hemoglobin: 11.8 g/dL — ABNORMAL LOW (ref 12.0–15.0)
MCH: 32.3 pg (ref 26.0–34.0)
MCHC: 31.3 g/dL (ref 30.0–36.0)
MCV: 103.3 fL — ABNORMAL HIGH (ref 80.0–100.0)
Platelets: 169 10*3/uL (ref 150–400)
RBC: 3.65 MIL/uL — ABNORMAL LOW (ref 3.87–5.11)
RDW: 13.5 % (ref 11.5–15.5)
WBC: 7.4 10*3/uL (ref 4.0–10.5)
nRBC: 0 % (ref 0.0–0.2)

## 2020-04-02 MED ORDER — CEPHALEXIN 500 MG PO CAPS
500.0000 mg | ORAL_CAPSULE | Freq: Three times a day (TID) | ORAL | Status: DC
  Administered 2020-04-02 – 2020-04-04 (×6): 500 mg via ORAL
  Filled 2020-04-02 (×6): qty 1

## 2020-04-02 NOTE — Evaluation (Signed)
Occupational Therapy Evaluation Patient Details Name: Samantha [name] MRN: [id number] DOB: 1940/07/06 Today's Date: 04/02/2020    History of Present Illness The pt is a 79 yo female presenting from [facility] independent living facility with AMS. Upon work-up, pt found to have UTI, sepsis, and AKI. PMH includes: CHF with EF of 30%, AICD, renal artery aneurysm, CKD III HTN, colon cancer, OSA on CPAP, frequent UTI, and dementia.    Clinical Impression   PTA patient independent with ADLs, mobility (per pt report and chart review) from ILF but noted poor historian with hx of dementia and AMS.  Admitted for above and limited by problem list below, including impaired balance, generalized weakness, decreased activity tolerance, and impaired cognition.  Patient oriented to self only, min cueing for orientation to month; poor attention to task but easily redirected today, follows simple commands with increased time with fair consistency.  She completes bed mobility with min assist, ADLs with min-max assist at EOB. After sitting EOB for less than 5 minutes, patient reports "I just want to go back to bed" and declined further ADL engagement or mobility.  Believe she will benefit from further OT services while admitted and after dc at SNF level to optimize independence and safety with ADLs, mobility.  Will follow acutely.     Follow Up Recommendations  SNF;Supervision/Assistance - 24 hour    Equipment Recommendations  Other (comment) (TBD at next venue of care )    Recommendations for Other Services       Precautions / Restrictions Precautions Precautions: Fall Precaution Comments: dementia at baseline, AMS Restrictions Weight Bearing Restrictions: No      Mobility Bed Mobility Overal bed mobility: Needs Assistance Bed Mobility: Supine to Sit;Sit to Supine     Supine to sit: Min assist Sit to supine: Min assist   General bed mobility comments: min assist to initate LB movement towards  EOB, min support to elevate trunk; returned to supine with min assist for LB support  Transfers                 General transfer comment: pt declined, reports "I just want to go back to bed" after minimal time EOB    Balance Overall balance assessment: Needs assistance Sitting-balance support: No upper extremity supported;Feet supported Sitting balance-Leahy Scale: Fair Sitting balance - Comments: mild posterior lean with dynamic 0 hand support                                   ADL either performed or assessed with clinical judgement   ADL Overall ADL's : Needs assistance/impaired     Grooming: Minimal assistance;Sitting   Upper Body Bathing: Minimal assistance;Sitting   Lower Body Bathing: Maximal assistance;Sitting/lateral leans   Upper Body Dressing : Moderate assistance;Sitting   Lower Body Dressing: Total assistance;Sitting/lateral leans     Toilet Transfer Details (indicate cue type and reason): deferred          Functional mobility during ADLs: Minimal assistance;Cueing for safety General ADL Comments: patient limited by impaired cognition, generalized weakness and decreased activity tolerance     Vision Baseline Vision/History: Wears glasses Wears Glasses: At all times Patient Visual Report:  (glasses not present ) Vision Assessment?: No apparent visual deficits     Perception     Praxis      Pertinent Vitals/Pain Pain Assessment: No/denies pain     Hand Dominance Right   Extremity/Trunk  Assessment Upper Extremity Assessment Upper Extremity Assessment: Generalized weakness   Lower Extremity Assessment Lower Extremity Assessment: Defer to PT evaluation       Communication Communication Communication: Expressive difficulties (at times mumbled speech)   Cognition Arousal/Alertness: Awake/alert Behavior During Therapy: Flat affect Overall Cognitive Status: Impaired/Different from baseline Area of Impairment:  Orientation;Attention;Memory;Following commands;Safety/judgement;Problem solving;Awareness                 Orientation Level: Disoriented to;Time;Situation;Place Current Attention Level: Focused Memory: Decreased recall of precautions;Decreased short-term memory Following Commands: Follows one step commands inconsistently;Follows one step commands with increased time Safety/Judgement: Decreased awareness of deficits;Decreased awareness of safety Awareness: Intellectual Problem Solving: Slow processing;Difficulty sequencing;Requires verbal cues;Decreased initiation General Comments: pt able to verbalize name/birthday, reports september and able to problem solve correct month with minimal cueing; decreased attention to task, requires cueing for initation, seqencning and recall; easily redirected today    General Comments  VSS on RA     Exercises     Shoulder Instructions      Home Living Family/patient expects to be discharged to:: Skilled nursing facility                                 Additional Comments: from [place]      Prior Functioning/Environment Level of Independence: Independent        Comments: pt reports independent with ADLs, per chart independent with mobility without AD         OT Problem List: Decreased strength;Decreased activity tolerance;Impaired balance (sitting and/or standing);Decreased safety awareness;Decreased cognition;Decreased knowledge of use of DME or AE;Decreased knowledge of precautions;Obesity      OT Treatment/Interventions:      OT Goals(Current goals can be found in the care plan section) Acute Rehab OT Goals Patient Stated Goal: to lay back down OT Goal Formulation: With patient Time For Goal Achievement: 04/16/20 Potential to Achieve Goals: Fair  OT Frequency:     Barriers to D/C:            Co-evaluation              AM-PAC OT "6 Clicks" Daily Activity     Outcome Measure  Help from another person eating meals?: A Little Help from another person taking care of personal grooming?: A Little Help from another person toileting, which includes using toliet, bedpan, or urinal?: A Lot Help from another person bathing (including washing, rinsing, drying)?: A Lot Help from another person to put on and taking off regular upper body clothing?: A Lot Help from another person to put on and taking off regular lower body clothing?: A Lot 6 Click Score: 14   End of Session Nurse Communication: Mobility status;Other (comment) (purewick)  Activity Tolerance: Patient limited by fatigue Patient left: in bed;with call bell/phone within reach;with bed alarm set  OT Visit Diagnosis: Other abnormalities of gait and mobility (R26.89);Muscle weakness (generalized) (M62.81);Other symptoms and signs involving cognitive function                Time: 6270-3500 OT Time Calculation (min): 10 min Charges:  OT General Charges $OT Visit: 1 Visit OT Evaluation $OT Eval Moderate Complexity: 1 Mod  Jolaine Artist, OT Acute Rehabilitation Services Pager [phone] Office [phone]   Delight Stare 04/02/2020, 11:16 AM

## 2020-04-02 NOTE — Progress Notes (Signed)
[facility] PROGRESS NOTE   Samantha [name] XBL:[id number] DOB: 19-Jan-1941 DOA: 03/29/2020  PCP: Haywood Pao, MD  Brief History/Interval Summary: 79 y.o.femalewith medical history significant fornonischemic cardiomegaly, heart failure with reduced ejection fraction, history of small bowel obstruction, left bundle branch block status post bivalve the ICD insertionin 05/2016,renal artery aneurysm, s/p cardiac catheterization in 2000,dementia/chronic memory loss/vascular dementia without behavioral disturbance, hypertension, OSA on CPAP nightly, history of frequent UTI,history of closed colles fracture of left radius in 2016,anal cancer who presents from nursing home for mental statuschangeafter being discovered for not coming down for breakfast.  Patient was noted to have acute renal failure and hyponatremia.  There was concern for a urinary tract infection.  Patient was hospitalized for further management. Per daughter-in-law patient has received 2 doses of Covid 19 vaccination.  Reason for Visit: Acute metabolic encephalopathy.  Acute kidney injury.  Consultants: None  Procedures: None  Antibiotics: Anti-infectives (From admission, onward)   Start     Dose/Rate Route Frequency Ordered Stop   03/31/20 1445  cefTRIAXone (ROCEPHIN) 1 g in sodium chloride 0.9 % 100 mL IVPB        1 g 200 mL/hr over 30 Minutes Intravenous Every 24 hours 03/31/20 1346     03/30/20 2200  vancomycin (VANCOCIN) IVPB 1000 mg/200 mL premix  Status:  Discontinued        1,000 mg 200 mL/hr over 60 Minutes Intravenous Every 24 hours 03/30/20 0826 03/31/20 1346   03/30/20 1600  ceFEPIme (MAXIPIME) 2 g in sodium chloride 0.9 % 100 mL IVPB  Status:  Discontinued        2 g 200 mL/hr over 30 Minutes Intravenous Every 24 hours 03/29/20 1637 03/31/20 1346   03/29/20 1645  vancomycin variable dose per unstable renal function (pharmacist dosing)  Status:  Discontinued         Does not apply See admin  instructions 03/29/20 1645 03/31/20 1346   03/29/20 1630  ceFEPIme (MAXIPIME) 2 g in sodium chloride 0.9 % 100 mL IVPB        2 g 200 mL/hr over 30 Minutes Intravenous  Once 03/29/20 1617 03/29/20 1752   03/29/20 1630  metroNIDAZOLE (FLAGYL) IVPB 500 mg        500 mg 100 mL/hr over 60 Minutes Intravenous  Once 03/29/20 1617 03/29/20 1917   03/29/20 1630  vancomycin (VANCOCIN) IVPB 1000 mg/200 mL premix  Status:  Discontinued        1,000 mg 200 mL/hr over 60 Minutes Intravenous  Once 03/29/20 1617 03/29/20 1627   03/29/20 1630  vancomycin (VANCOREADY) IVPB 1750 mg/350 mL        1,750 mg 175 mL/hr over 120 Minutes Intravenous  Once 03/29/20 1627 03/29/20 2032      Subjective/Interval History: Patient noted to be somewhat distracted this morning.  Noted to have mittens on her hands.  Denies any pain.     Assessment/Plan:  Sepsis, present on admission secondary to urinary source, with organ dysfunction Patient had elevated lactic acid level, initially 2.9.  Subsequently increased to 3.2 and then to 2.8.  Procalcitonin 0.47.    Patient was given broad-spectrum antibiotics.  Cultures were negative.  Vancomycin was discontinued.  Cefepime was changed over to ceftriaxone.   SIRS physiology has resolved.  Mentation has improved though she remains confused.  Continue to monitor.    Urinary tract infection Abnormal UA was noted.  Urine culture with only 4000 colonies of E. coli.  Blood cultures negative.  Patient transitioned to ceftriaxone.  Plan for 7-day treatment with antibiotics.  Could consider transitioning to oral agents depending on her oral intake.  Acute kidney injury on chronic kidney disease stage IIIa/hypernatremia/hypokalemia Baseline creatinine is around 1.3.  Presented with creatinine of 2.8. Renal failure most likely due to hypovolemia in the setting of ARB use.   Patient was aggressively hydrated.  Renal function is now back to baseline.  Oral intake initiated.  Cut back  on IV fluids.  Sodium level has normalized.  Discontinue D5.  Acute metabolic encephalopathy Reason for her encephalopathy is multifactorial including sepsis, metabolic derangements, dehydration.  CT head did not show any acute findings.   Patient has been more responsive.  Still remains very confused.  This is likely due to underlying dementia.  No focal deficits noted.  Continue to monitor.  Patient's son was told that the recovery will be very slow.    Transaminitis Mildly elevated AST and ALT noted.  Bilirubin and alkaline phosphatase is normal.    LFTs are stable.  Continue to monitor.    Hepatitis panel negative.  Macrocytosis Folate 22.3.  B12 noted to be borderline low at 225.  TSH 0.756.  Vitamin B12 deficiency B12 level noted to be borderline low at 225.  Started on vitamin B12.  History of chronic systolic CHF She is status post ICD placement.  Echocardiogram available from 2017 which showed a EF of 30%.  Has been cleared for diet and so we can resume some of her cardiac medications.  However blood pressure noted to be borderline low so will watch for another day before resuming.  History of SVT Stable.  Monitor on telemetry.  History of obstructive sleep apnea on CPAP Continue CPAP.  Essential hypertension Blood pressure noted to be borderline low.  Continue to hold her medications.    History of anxiety and depression Was on citalopram at home which is currently on hold.  History of dementia and memory disturbance On Namenda at home.  Okay to resume.  History of restless leg syndrome Requip is on hold.  Obesity Estimated body mass index is 34.37 kg/m as calculated from the following:   Height as of this encounter: 5\' 3"  (1.6 m).   Weight as of this encounter: 88 kg.  DVT Prophylaxis: Subcutaneous heparin Code Status: DNR Family Communication:  Son being updated daily.  [place]. Disposition Plan:  Seen by PT.  SNF is recommended.   Apparently came from [place].    Status is: Inpatient  Remains inpatient appropriate because:Altered mental status and IV treatments appropriate due to intensity of illness or inability to take PO   Dispo:  Patient From: Independent living facility  Planned Disposition: [place]  Expected discharge date: 04/05/20  Medically stable for discharge: No       Medications:  Scheduled: . heparin  5,000 Units Subcutaneous Q8H  . insulin aspart  0-9 Units Subcutaneous TID WC  . vitamin B-12  500 mcg Oral Daily   Continuous: . cefTRIAXone (ROCEPHIN)  IV 1 g (04/01/20 1606)  . dextrose 5 % with KCl 20 mEq / L 20 mEq (04/01/20 2129)   PRN:   Objective:  Vital Signs  Vitals:   04/01/20 1741 04/01/20 2159 04/02/20 0534 04/02/20 0844  BP: (!) 104/54 112/65 124/65 101/66  Pulse: 94 95 93 86  Resp: 20 18 (!) 24 (!) 22  Temp: 98.8 F (37.1 C) 97.7 F (36.5 C) 97.9 F (36.6 C) 98.6 F (37 C)  TempSrc:  Oral Oral Oral  SpO2: 97% 100% 99% 96%  Weight:      Height:        Intake/Output Summary (Last 24 hours) at 04/02/2020 0939 Last data filed at 04/02/2020 0846 Gross per 24 hour  Intake 1501.42 ml  Output 725 ml  Net 776.42 ml   Filed Weights   03/29/20 1815 03/31/20 2012  Weight: 88 kg 83.7 kg    General appearance: awake alert.  Remains distracted. Resp: Clear to auscultation bilaterally.  Normal effort Cardio: S1-S2 is normal regular.  No S3-S4.  No rubs murmurs or bruit GI: Abdomen is soft.  Nontender nondistended.  Bowel sounds are present normal.  No masses organomegaly Extremities: No edema.  Moving all her extremities.  General deconditioning noted. Neurologic: She is awake.  She knew she was in the hospital.  Thought it was 2022.  Remains disoriented.  Distracted.  No focal deficits.      Lab Results:  Data Reviewed: I have personally reviewed following labs and imaging studies  CBC: Recent Labs  Lab 03/29/20 1507 03/30/20 0456  03/31/20 0231 04/01/20 0335 04/02/20 0548  WBC 15.1* 18.7* 17.0* 12.3* 7.4  NEUTROABS 13.2*  --   --   --   --   HGB 14.7 13.9 13.9 12.1 11.8*  HCT 49.3* 46.9* 46.2* 39.6 37.7  MCV 104.4* 106.8* 106.5* 101.5* 103.3*  PLT 187 166 106* 129* 638    Basic Metabolic Panel: Recent Labs  Lab 03/30/20 1815 03/31/20 0231 03/31/20 1523 04/01/20 0335 04/02/20 0548  NA 158* 155* 153* 150* 145  K 3.9 4.0 3.8 4.1 4.4  CL 124* 123* 120* 119* 114*  CO2 21* 18* 24 23 22   GLUCOSE 192* 188* 176* 162* 159*  BUN 59* 55* 39* 32* 25*  CREATININE 1.97* 1.57* 1.30* 1.25* 1.04*  CALCIUM 9.0 8.9 8.6* 8.7* 8.5*    GFR: Estimated Creatinine Clearance: 45.7 mL/min (A) (by C-G formula based on SCr of 1.04 mg/dL (H)).  Liver Function Tests: Recent Labs  Lab 03/29/20 1507 03/30/20 0456 03/31/20 0231 04/01/20 0335 04/02/20 0548  AST 98* 76* 70* 63* 54*  ALT 96* 76* 65* 57* 53*  ALKPHOS 43 44 53 50 59  BILITOT 1.2 1.0 1.1 0.9 1.0  PROT 7.7 6.9 6.5 6.1* 5.8*  ALBUMIN 3.8 3.3* 3.0* 2.5* 2.4*     Coagulation Profile: Recent Labs  Lab 03/29/20 1507 03/30/20 0456  INR 1.3* 1.5*    CBG: Recent Labs  Lab 04/01/20 1637 04/01/20 2014 04/02/20 0022 04/02/20 0416 04/02/20 0656  GLUCAP 102* 192* 175* 141* 157*    Thyroid Function Tests: Recent Labs    03/31/20 0231  TSH 0.756    Anemia Panel: Recent Labs    03/31/20 0231  VITAMINB12 225  FOLATE 22.3    Recent Results (from the past 240 hour(s))  Culture, blood (Routine x 2)     Status: None (Preliminary result)   Collection Time: 03/29/20  3:21 PM   Specimen: BLOOD  Result Value Ref Range Status   Specimen Description BLOOD SITE NOT SPECIFIED  Final   Special Requests   Final    BOTTLES DRAWN AEROBIC AND ANAEROBIC Blood Culture adequate volume   Culture   Final    NO GROWTH 4 DAYS Performed at [facility] [address] [place], [place] [address]    Report Status PENDING  Incomplete  Culture, blood (Routine  x 2)     Status: None (Preliminary result)  Collection Time: 03/29/20  4:39 PM   Specimen: BLOOD  Result Value Ref Range Status   Specimen Description BLOOD SITE NOT SPECIFIED  Final   Special Requests   Final    BOTTLES DRAWN AEROBIC AND ANAEROBIC Blood Culture adequate volume   Culture   Final    NO GROWTH 4 DAYS Performed at [facility], [address], [place], [place] [address]    Report Status PENDING  Incomplete  Respiratory Panel by RT PCR (Flu A&B, Covid) - Nasopharyngeal Swab     Status: None   Collection Time: 03/29/20  6:30 PM   Specimen: Nasopharyngeal Swab  Result Value Ref Range Status   SARS Coronavirus 2 by RT PCR NEGATIVE NEGATIVE Final    Comment: (NOTE) SARS-CoV-2 target nucleic acids are NOT DETECTED.  The SARS-CoV-2 RNA is generally detectable in upper respiratoy specimens during the acute phase of infection. The lowest concentration of SARS-CoV-2 viral copies this assay can detect is 131 copies/mL. A negative result does not preclude SARS-Cov-2 infection and should not be used as the sole basis for treatment or other patient management decisions. A negative result may occur with  improper specimen collection/handling, submission of specimen other than nasopharyngeal swab, presence of viral mutation(s) within the areas targeted by this assay, and inadequate number of viral copies (<131 copies/mL). A negative result must be combined with clinical observations, patient history, and epidemiological information. The expected result is Negative.  Fact Sheet for Patients:  PinkCheek.be  Fact Sheet for Healthcare Providers:  GravelBags.it  This test is no t yet approved or cleared by the [place] FDA and  has been authorized for detection and/or diagnosis of SARS-CoV-2 by FDA under an Emergency Use Authorization (EUA). This EUA will remain  in effect (meaning this test can be used) for the  duration of the COVID-19 declaration under Section 564(b)(1) of the Act, 21 U.S.C. section 360bbb-3(b)(1), unless the authorization is terminated or revoked sooner.     Influenza A by PCR NEGATIVE NEGATIVE Final   Influenza B by PCR NEGATIVE NEGATIVE Final    Comment: (NOTE) The Xpert Xpress SARS-CoV-2/FLU/RSV assay is intended as an aid in  the diagnosis of influenza from Nasopharyngeal swab specimens and  should not be used as a sole basis for treatment. Nasal washings and  aspirates are unacceptable for Xpert Xpress SARS-CoV-2/FLU/RSV  testing.  Fact Sheet for Patients: PinkCheek.be  Fact Sheet for Healthcare Providers: GravelBags.it  This test is not yet approved or cleared by the [place] FDA and  has been authorized for detection and/or diagnosis of SARS-CoV-2 by  FDA under an Emergency Use Authorization (EUA). This EUA will remain  in effect (meaning this test can be used) for the duration of the  Covid-19 declaration under Section 564(b)(1) of the Act, 21  U.S.C. section 360bbb-3(b)(1), unless the authorization is  terminated or revoked. Performed at [facility], [place] [address] [place], [place] [address]   Urine culture     Status: Abnormal   Collection Time: 03/29/20  7:29 PM   Specimen: In/Out Cath Urine  Result Value Ref Range Status   Specimen Description IN/OUT CATH URINE  Final   Special Requests   Final    NONE Performed at [facility], [place] [address], [place], [place] [address]    Culture 4,000 COLONIES/mL ESCHERICHIA COLI (A)  Final   Report Status 03/31/2020 FINAL  Final   Organism ID, Bacteria ESCHERICHIA COLI (A)  Final  Susceptibility   Escherichia coli - MIC*    AMPICILLIN >=32 RESISTANT Resistant     CEFAZOLIN <=4 SENSITIVE Sensitive     CEFTRIAXONE <=0.25 SENSITIVE Sensitive     CIPROFLOXACIN <=0.25 SENSITIVE Sensitive     GENTAMICIN >=16 RESISTANT Resistant      IMIPENEM <=0.25 SENSITIVE Sensitive     NITROFURANTOIN <=16 SENSITIVE Sensitive     TRIMETH/SULFA >=320 RESISTANT Resistant     AMPICILLIN/SULBACTAM 16 INTERMEDIATE Intermediate     PIP/TAZO <=4 SENSITIVE Sensitive     * 4,000 COLONIES/mL ESCHERICHIA COLI  Blood culture (routine single)     Status: None (Preliminary result)   Collection Time: 03/30/20  4:56 AM   Specimen: BLOOD LEFT HAND  Result Value Ref Range Status   Specimen Description BLOOD LEFT HAND  Final   Special Requests   Final    BOTTLES DRAWN AEROBIC AND ANAEROBIC Blood Culture adequate volume   Culture   Final    NO GROWTH 3 DAYS Performed at [facility], [address] [place], [place] [address]    Report Status PENDING  Incomplete      Radiology Studies: No results found.     LOS: 4 days   Moncia Annas [employer] on [email or web address]  04/02/2020, 9:39 AM

## 2020-04-02 NOTE — Progress Notes (Signed)
Pt refused cpap for tonight 

## 2020-04-03 ENCOUNTER — Inpatient Hospital Stay (HOSPITAL_COMMUNITY): Payer: Medicare Other

## 2020-04-03 DIAGNOSIS — G9341 Metabolic encephalopathy: Secondary | ICD-10-CM | POA: Diagnosis not present

## 2020-04-03 DIAGNOSIS — N390 Urinary tract infection, site not specified: Secondary | ICD-10-CM | POA: Diagnosis not present

## 2020-04-03 LAB — COMPREHENSIVE METABOLIC PANEL
ALT: 49 U/L — ABNORMAL HIGH (ref 0–44)
AST: 65 U/L — ABNORMAL HIGH (ref 15–41)
Albumin: 2.6 g/dL — ABNORMAL LOW (ref 3.5–5.0)
Alkaline Phosphatase: 63 U/L (ref 38–126)
Anion gap: 11 (ref 5–15)
BUN: 23 mg/dL (ref 8–23)
CO2: 19 mmol/L — ABNORMAL LOW (ref 22–32)
Calcium: 8.8 mg/dL — ABNORMAL LOW (ref 8.9–10.3)
Chloride: 115 mmol/L — ABNORMAL HIGH (ref 98–111)
Creatinine, Ser: 0.96 mg/dL (ref 0.44–1.00)
GFR, Estimated: 57 mL/min — ABNORMAL LOW (ref >60)
Glucose, Bld: 96 mg/dL (ref 70–99)
Potassium: 5.4 mmol/L — ABNORMAL HIGH (ref 3.5–5.1)
Sodium: 145 mmol/L (ref 135–145)
Total Bilirubin: 1.8 mg/dL — ABNORMAL HIGH (ref 0.3–1.2)
Total Protein: 5.8 g/dL — ABNORMAL LOW (ref 6.5–8.1)

## 2020-04-03 LAB — CULTURE, BLOOD (ROUTINE X 2)
Culture: NO GROWTH
Culture: NO GROWTH
Special Requests: ADEQUATE
Special Requests: ADEQUATE

## 2020-04-03 LAB — CBC
HCT: 42 % (ref 36.0–46.0)
Hemoglobin: 13.1 g/dL (ref 12.0–15.0)
MCH: 32 pg (ref 26.0–34.0)
MCHC: 31.2 g/dL (ref 30.0–36.0)
MCV: 102.7 fL — ABNORMAL HIGH (ref 80.0–100.0)
Platelets: 143 K/uL — ABNORMAL LOW (ref 150–400)
RBC: 4.09 MIL/uL (ref 3.87–5.11)
RDW: 13.5 % (ref 11.5–15.5)
WBC: 8 K/uL (ref 4.0–10.5)
nRBC: 0 % (ref 0.0–0.2)

## 2020-04-03 LAB — GLUCOSE, CAPILLARY
Glucose-Capillary: 132 mg/dL — ABNORMAL HIGH (ref 70–99)
Glucose-Capillary: 135 mg/dL — ABNORMAL HIGH (ref 70–99)
Glucose-Capillary: 178 mg/dL — ABNORMAL HIGH (ref 70–99)
Glucose-Capillary: 147 mg/dL — ABNORMAL HIGH (ref 70–99)
Glucose-Capillary: 161 mg/dL — ABNORMAL HIGH (ref 70–99)

## 2020-04-03 MED ORDER — CARVEDILOL 3.125 MG PO TABS
3.1250 mg | ORAL_TABLET | Freq: Two times a day (BID) | ORAL | Status: DC
  Administered 2020-04-03 – 2020-04-06 (×7): 3.125 mg via ORAL
  Filled 2020-04-03 (×7): qty 1

## 2020-04-03 MED ORDER — MEMANTINE HCL 10 MG PO TABS
10.0000 mg | ORAL_TABLET | Freq: Two times a day (BID) | ORAL | Status: DC
  Administered 2020-04-03 – 2020-04-05 (×6): 10 mg via ORAL
  Filled 2020-04-03 (×8): qty 1

## 2020-04-03 MED ORDER — CITALOPRAM HYDROBROMIDE 20 MG PO TABS
20.0000 mg | ORAL_TABLET | Freq: Every day | ORAL | Status: DC
  Administered 2020-04-03 – 2020-04-04 (×2): 20 mg via ORAL
  Filled 2020-04-03 (×2): qty 1

## 2020-04-03 NOTE — Progress Notes (Signed)
[facility] PROGRESS NOTE   NAVA SONG LGX:[id number] DOB: May 20, 1941 DOA: 03/29/2020  PCP: Haywood Pao, MD  Brief History/Interval Summary: 79 y.o.femalewith medical history significant fornonischemic cardiomegaly, heart failure with reduced ejection fraction, history of small bowel obstruction, left bundle branch block status post bivalve the ICD insertionin 05/2016,renal artery aneurysm, s/p cardiac catheterization in 2000,dementia/chronic memory loss/vascular dementia without behavioral disturbance, hypertension, OSA on CPAP nightly, history of frequent UTI,history of closed colles fracture of left radius in 2016,anal cancer who presents from nursing home for mental statuschangeafter being discovered for not coming down for breakfast.  Patient was noted to have acute renal failure and hyponatremia.  There was concern for a urinary tract infection.  Patient was hospitalized for further management. Per daughter-in-law patient has received 2 doses of Covid 19 vaccination.  Reason for Visit: Acute metabolic encephalopathy.  Acute kidney injury.  Consultants: None  Procedures: None  Antibiotics: Anti-infectives (From admission, onward)   Start     Dose/Rate Route Frequency Ordered Stop   04/02/20 1400  cephALEXin (KEFLEX) capsule 500 mg        500 mg Oral Every 8 hours 04/02/20 0948     03/31/20 1445  cefTRIAXone (ROCEPHIN) 1 g in sodium chloride 0.9 % 100 mL IVPB  Status:  Discontinued        1 g 200 mL/hr over 30 Minutes Intravenous Every 24 hours 03/31/20 1346 04/02/20 0948   03/30/20 2200  vancomycin (VANCOCIN) IVPB 1000 mg/200 mL premix  Status:  Discontinued        1,000 mg 200 mL/hr over 60 Minutes Intravenous Every 24 hours 03/30/20 0826 03/31/20 1346   03/30/20 1600  ceFEPIme (MAXIPIME) 2 g in sodium chloride 0.9 % 100 mL IVPB  Status:  Discontinued        2 g 200 mL/hr over 30 Minutes Intravenous Every 24 hours 03/29/20 1637 03/31/20 1346    03/29/20 1645  vancomycin variable dose per unstable renal function (pharmacist dosing)  Status:  Discontinued         Does not apply See admin instructions 03/29/20 1645 03/31/20 1346   03/29/20 1630  ceFEPIme (MAXIPIME) 2 g in sodium chloride 0.9 % 100 mL IVPB        2 g 200 mL/hr over 30 Minutes Intravenous  Once 03/29/20 1617 03/29/20 1752   03/29/20 1630  metroNIDAZOLE (FLAGYL) IVPB 500 mg        500 mg 100 mL/hr over 60 Minutes Intravenous  Once 03/29/20 1617 03/29/20 1917   03/29/20 1630  vancomycin (VANCOCIN) IVPB 1000 mg/200 mL premix  Status:  Discontinued        1,000 mg 200 mL/hr over 60 Minutes Intravenous  Once 03/29/20 1617 03/29/20 1627   03/29/20 1630  vancomycin (VANCOREADY) IVPB 1750 mg/350 mL        1,750 mg 175 mL/hr over 120 Minutes Intravenous  Once 03/29/20 1627 03/29/20 2032      Subjective/Interval History: Patient noted to be very somnolent this morning.  Followed certain commands but went right back to sleep.       Assessment/Plan:  Sepsis, present on admission secondary to urinary source, with organ dysfunction Patient had elevated lactic acid level, initially 2.9.  Subsequently increased to 3.2 and then to 2.8.  Procalcitonin 0.47.    Patient was given broad-spectrum antibiotics.  Cultures were negative.  Vancomycin was discontinued.  Cefepime was changed over to ceftriaxone.   Mentation seems to have improved.  Noted to be somnolent  today.  Sepsis physiology resolved.    Urinary tract infection Abnormal UA was noted.  Urine culture with only 4000 colonies of E. coli.  Blood cultures negative.  He was on cefepime which was changed over to ceftriaxone.  Patient changed over to cephalexin yesterday.  Plan for 7-day treatment.    Acute kidney injury on chronic kidney disease stage IIIa/hypernatremia/hypokalemia Baseline creatinine is around 1.3.  Presented with creatinine of 2.8. Renal failure most likely due to hypovolemia in the setting of ARB use.   Patient was aggressively hydrated.  Renal function back to baseline.  Elevated potassium level today is likely due to slight hemolysis.  We will recheck it tomorrow. IV fluids were discontinued.  Acute metabolic encephalopathy Reason for her encephalopathy is multifactorial including sepsis, metabolic derangements, dehydration.  CT head did not show any acute findings.   Patient mentation has improved though she remains confused.  Today she is noted to be somnolent.  Recovery will likely be very slow and she may never get back to her baseline.  She does have underlying dementia.  Transaminitis Mildly elevated LFTs noted.  Abdomen has been benign.  Bilirubin noted to be slightly higher today.  Recheck labs tomorrow.  Hepatitis panel was negative.  Could consider right upper quadrant ultrasound since history has been limited.   Macrocytosis Folate 22.3.  B12 noted to be borderline low at 225.  TSH 0.756.  Vitamin B12 deficiency B12 level noted to be borderline low at 225.  Started on vitamin B12.  History of chronic systolic CHF She is status post ICD placement.  Echocardiogram available from 2017 which showed a EF of 30%.  Have not reinitiated her cardiac medications due to borderline low blood pressures.  Could consider reinitiating Coreg at a low dose.  Continue to hold her Cozaar.  Also hold her furosemide.  History of SVT Stable.  Monitor on telemetry.  History of obstructive sleep apnea on CPAP Continue CPAP.  Essential hypertension Blood pressure low but stable.  Slightly better than before.  Low-dose carvedilol as discussed above.  History of anxiety and depression Was on citalopram at home which is currently on hold.  Okay to resume.  History of dementia and memory disturbance On Namenda at home.  Okay to resume.  History of restless leg syndrome Requip is on hold.  Obesity Estimated body mass index is 34.37 kg/m as calculated from the following:   Height as  of this encounter: 5\' 3"  (1.6 m).   Weight as of this encounter: 88 kg.  DVT Prophylaxis: Subcutaneous heparin Code Status: DNR Family Communication:  Son being updated daily.  [place]. Disposition Plan:  Seen by PT.  SNF is recommended.  Apparently came from [place].    Status is: Inpatient  Remains inpatient appropriate because:Altered mental status and IV treatments appropriate due to intensity of illness or inability to take PO   Dispo:  Patient From: Independent living facility  Planned Disposition: [place]  Expected discharge date: 04/05/20  Medically stable for discharge: No       Medications:  Scheduled: . cephALEXin  500 mg Oral Q8H  . heparin  5,000 Units Subcutaneous Q8H  . insulin aspart  0-9 Units Subcutaneous TID WC  . vitamin B-12  500 mcg Oral Daily   Continuous:  PRN:   Objective:  Vital Signs  Vitals:   04/02/20 0844 04/02/20 1651 04/02/20 2010 04/03/20 0449  BP: 101/66 119/66 121/78 107/67  Pulse: 86 91 85 94  Resp: (!)  22 (!) 21 18 17   Temp: 98.6 F (37 C)  98.8 F (37.1 C) 97.9 F (36.6 C)  TempSrc: Oral  Oral Oral  SpO2: 96% 97% 98% 92%  Weight:   83.4 kg   Height:        Intake/Output Summary (Last 24 hours) at 04/03/2020 1035 Last data filed at 04/03/2020 0854 Gross per 24 hour  Intake 360 ml  Output 0 ml  Net 360 ml   Filed Weights   03/29/20 1815 03/31/20 2012 04/02/20 2010  Weight: 88 kg 83.7 kg 83.4 kg    General appearance: Somnolent this morning.  Easily arousable Resp: Clear to auscultation bilaterally.  Normal effort Cardio: S1-S2 is normal regular.  No S3-S4.  No rubs murmurs or bruit GI: Abdomen is soft.  Nontender nondistended.  Bowel sounds are present normal.  No masses organomegaly Extremities: No edema.  Moving her extremities.  General deconditioning noted. Neurologic: Remains confused distracted.  No focal deficits.   Lab Results:  Data Reviewed: I have personally reviewed  following labs and imaging studies  CBC: Recent Labs  Lab 03/29/20 1507 03/29/20 1507 03/30/20 0456 03/31/20 0231 04/01/20 0335 04/02/20 0548 04/03/20 0310  WBC 15.1*   < > 18.7* 17.0* 12.3* 7.4 8.0  NEUTROABS 13.2*  --   --   --   --   --   --   HGB 14.7   < > 13.9 13.9 12.1 11.8* 13.1  HCT 49.3*   < > 46.9* 46.2* 39.6 37.7 42.0  MCV 104.4*   < > 106.8* 106.5* 101.5* 103.3* 102.7*  PLT 187   < > 166 106* 129* 169 143*   < > = values in this interval not displayed.    Basic Metabolic Panel: Recent Labs  Lab 03/31/20 0231 03/31/20 1523 04/01/20 0335 04/02/20 0548 04/03/20 0310  NA 155* 153* 150* 145 145  K 4.0 3.8 4.1 4.4 5.4*  CL 123* 120* 119* 114* 115*  CO2 18* 24 23 22  19*  GLUCOSE 188* 176* 162* 159* 96  BUN 55* 39* 32* 25* 23  CREATININE 1.57* 1.30* 1.25* 1.04* 0.96  CALCIUM 8.9 8.6* 8.7* 8.5* 8.8*    GFR: Estimated Creatinine Clearance: 49.4 mL/min (by C-G formula based on SCr of 0.96 mg/dL).  Liver Function Tests: Recent Labs  Lab 03/30/20 0456 03/31/20 0231 04/01/20 0335 04/02/20 0548 04/03/20 0310  AST 76* 70* 63* 54* 65*  ALT 76* 65* 57* 53* 49*  ALKPHOS 44 53 50 59 63  BILITOT 1.0 1.1 0.9 1.0 1.8*  PROT 6.9 6.5 6.1* 5.8* 5.8*  ALBUMIN 3.3* 3.0* 2.5* 2.4* 2.6*     Coagulation Profile: Recent Labs  Lab 03/29/20 1507 03/30/20 0456  INR 1.3* 1.5*    CBG: Recent Labs  Lab 04/02/20 1125 04/02/20 1647 04/02/20 2016 04/03/20 0629 04/03/20 0640  GLUCAP 165* 122* 126* 132* 135*     Recent Results (from the past 240 hour(s))  Culture, blood (Routine x 2)     Status: None   Collection Time: 03/29/20  3:21 PM   Specimen: BLOOD  Result Value Ref Range Status   Specimen Description BLOOD SITE NOT SPECIFIED  Final   Special Requests   Final    BOTTLES DRAWN AEROBIC AND ANAEROBIC Blood Culture adequate volume   Culture   Final    NO GROWTH 5 DAYS Performed at [facility], [place] [address], [place], [place] [address]    Report  Status 04/03/2020 FINAL  Final  Culture, blood (Routine x 2)     Status: None   Collection Time: 03/29/20  4:39 PM   Specimen: BLOOD  Result Value Ref Range Status   Specimen Description BLOOD SITE NOT SPECIFIED  Final   Special Requests   Final    BOTTLES DRAWN AEROBIC AND ANAEROBIC Blood Culture adequate volume   Culture   Final    NO GROWTH 5 DAYS Performed at [facility], [address], [place], [place] [address]    Report Status 04/03/2020 FINAL  Final  Respiratory Panel by RT PCR (Flu A&B, Covid) - Nasopharyngeal Swab     Status: None   Collection Time: 03/29/20  6:30 PM   Specimen: Nasopharyngeal Swab  Result Value Ref Range Status   SARS Coronavirus 2 by RT PCR NEGATIVE NEGATIVE Final    Comment: (NOTE) SARS-CoV-2 target nucleic acids are NOT DETECTED.  The SARS-CoV-2 RNA is generally detectable in upper respiratoy specimens during the acute phase of infection. The lowest concentration of SARS-CoV-2 viral copies this assay can detect is 131 copies/mL. A negative result does not preclude SARS-Cov-2 infection and should not be used as the sole basis for treatment or other patient management decisions. A negative result may occur with  improper specimen collection/handling, submission of specimen other than nasopharyngeal swab, presence of viral mutation(s) within the areas targeted by this assay, and inadequate number of viral copies (<131 copies/mL). A negative result must be combined with clinical observations, patient history, and epidemiological information. The expected result is Negative.  Fact Sheet for Patients:  PinkCheek.be  Fact Sheet for Healthcare Providers:  GravelBags.it  This test is no t yet approved or cleared by the [place] FDA and  has been authorized for detection and/or diagnosis of SARS-CoV-2 by FDA under an Emergency Use Authorization (EUA). This EUA will remain  in effect  (meaning this test can be used) for the duration of the COVID-19 declaration under Section 564(b)(1) of the Act, 21 U.S.C. section 360bbb-3(b)(1), unless the authorization is terminated or revoked sooner.     Influenza A by PCR NEGATIVE NEGATIVE Final   Influenza B by PCR NEGATIVE NEGATIVE Final    Comment: (NOTE) The Xpert Xpress SARS-CoV-2/FLU/RSV assay is intended as an aid in  the diagnosis of influenza from Nasopharyngeal swab specimens and  should not be used as a sole basis for treatment. Nasal washings and  aspirates are unacceptable for Xpert Xpress SARS-CoV-2/FLU/RSV  testing.  Fact Sheet for Patients: PinkCheek.be  Fact Sheet for Healthcare Providers: GravelBags.it  This test is not yet approved or cleared by the [place] FDA and  has been authorized for detection and/or diagnosis of SARS-CoV-2 by  FDA under an Emergency Use Authorization (EUA). This EUA will remain  in effect (meaning this test can be used) for the duration of the  Covid-19 declaration under Section 564(b)(1) of the Act, 21  U.S.C. section 360bbb-3(b)(1), unless the authorization is  terminated or revoked. Performed at [facility], [place] [address], [place], [place] [address]   Urine culture     Status: Abnormal   Collection Time: 03/29/20  7:29 PM   Specimen: In/Out Cath Urine  Result Value Ref Range Status   Specimen Description IN/OUT CATH URINE  Final   Special Requests   Final    NONE Performed at [facility], [place] [address], [place], [place] [address]    Culture 4,000 COLONIES/mL ESCHERICHIA COLI (A)  Final   Report Status 03/31/2020  FINAL  Final   Organism ID, Bacteria ESCHERICHIA COLI (A)  Final      Susceptibility   Escherichia coli - MIC*    AMPICILLIN >=32 RESISTANT Resistant     CEFAZOLIN <=4 SENSITIVE Sensitive     CEFTRIAXONE <=0.25 SENSITIVE Sensitive     CIPROFLOXACIN <=0.25 SENSITIVE Sensitive      GENTAMICIN >=16 RESISTANT Resistant     IMIPENEM <=0.25 SENSITIVE Sensitive     NITROFURANTOIN <=16 SENSITIVE Sensitive     TRIMETH/SULFA >=320 RESISTANT Resistant     AMPICILLIN/SULBACTAM 16 INTERMEDIATE Intermediate     PIP/TAZO <=4 SENSITIVE Sensitive     * 4,000 COLONIES/mL ESCHERICHIA COLI  Blood culture (routine single)     Status: None (Preliminary result)   Collection Time: 03/30/20  4:56 AM   Specimen: BLOOD LEFT HAND  Result Value Ref Range Status   Specimen Description BLOOD LEFT HAND  Final   Special Requests   Final    BOTTLES DRAWN AEROBIC AND ANAEROBIC Blood Culture adequate volume   Culture   Final    NO GROWTH 4 DAYS Performed at [facility], [address], [place], [place] [address]    Report Status PENDING  Incomplete      Radiology Studies: No results found.     LOS: 5 days   Darlette Dubow [employer] on [email or web address]  04/03/2020, 10:35 AM

## 2020-04-03 NOTE — Plan of Care (Signed)
  Problem: Pain Managment: Goal: General experience of comfort will improve Outcome: Progressing   

## 2020-04-03 NOTE — NC FL2 (Signed)
[place] MEDICAID FL2 LEVEL OF CARE SCREENING TOOL     IDENTIFICATION  Patient Name: Samantha [name] Birthdate: 1941-02-27 Sex: female Admission Date (Current Location): 03/29/2020  [facility] and [place] Number:  Herbalist and Address:  [facility], [place] [address], [place], [place] [address]      Provider Number: [id number]  Attending Physician Name and Address:  Bonnielee Haff, MD  Relative Name and Phone Number:  Cheyane Ayon    Current Level of Care: Hospital Recommended Level of Care: [place] Prior Approval Number:    Date Approved/Denied:   PASRR Number: [id number]  Discharge Plan: SNF    Current Diagnoses: Patient Active Problem List   Diagnosis Date Noted  . Sepsis ([place]) 03/29/2020  . Traumatic ecchymosis of left thoracic region 03/29/2020  . AKI (acute kidney injury) ([place])   . Hypernatremia   . Urinary tract infection without hematuria   . ICD (implantable cardioverter-defibrillator) in place 07/16/2019  . Chronic systolic CHF (congestive heart failure) ([place]) 06/15/2016  . RLS (restless legs syndrome) 07/07/2015  . Restless leg syndrome 12/30/2014  . Anemia, iron deficiency 11/25/2014  . OSA on CPAP 11/25/2014  . Fatigue due to sleep pattern disturbance 11/25/2014  . Restless legs syndrome (RLS) 11/24/2012  . Essential and other specified forms of tremor 11/24/2012  . Anal cancer ([place]) 10/05/2011  . SBO (small bowel obstruction) ([place]) 06/05/2011  . HTN (hypertension) 02/07/2011  . Abnormal EKG 11/27/2010  . OSA (obstructive sleep apnea) 10/10/2010  . Chronic systolic congestive heart failure ([place]) 09/29/2010  . Left bundle branch block 09/29/2010    Orientation RESPIRATION BLADDER Height & Weight     Self  Normal Incontinent Weight: 183 lb 13.8 oz (83.4 kg) Height:  5\' 3"  (160 cm)  BEHAVIORAL SYMPTOMS/MOOD NEUROLOGICAL BOWEL NUTRITION STATUS      Incontinent Diet (see DC Summary)   AMBULATORY STATUS COMMUNICATION OF NEEDS Skin   Limited Assist Verbally Normal                       Personal Care Assistance Level of Assistance  Bathing, Feeding, Dressing Bathing Assistance: Limited assistance Feeding assistance: Limited assistance Dressing Assistance: Limited assistance     Functional Limitations Info             [place]  PT (By licensed PT), OT (By licensed OT)     PT Frequency: 5x/wk OT Frequency: 5x/wk            Contractures Contractures Info: Not present    Additional Factors Info  Code Status, Allergies, Psychotropic Code Status Info: DNR Allergies Info: Codeine, Iohexol, Shellfish Allergy Psychotropic Info: Celexa 20mg  daily         Current Medications (04/03/2020):  This is the current hospital active medication list Current Facility-Administered Medications  Medication Dose Route Frequency Provider Last Rate Last Admin  . cephALEXin (KEFLEX) capsule 500 mg  500 mg Oral Q8H Bonnielee Haff, MD   500 mg at 04/03/20 0559  . heparin injection 5,000 Units  5,000 Units Subcutaneous Q8H Cox, Amy N, DO   5,000 Units at 04/03/20 0559  . insulin aspart (novoLOG) injection 0-9 Units  0-9 Units Subcutaneous TID WC Bonnielee Haff, MD   1 Units at 04/03/20 [phone]  . vitamin B-12 (CYANOCOBALAMIN) tablet 500 mcg  500 mcg Oral Daily Bonnielee Haff, MD   500 mcg at 04/02/20 0915     Discharge Medications: Please see discharge  summary for a list of discharge medications.  Relevant Imaging Results:  Relevant Lab Results:   Additional Information SS#: [id number]  Geralynn Ochs, LCSW

## 2020-04-03 NOTE — TOC Progression Note (Signed)
Transition of Care [facility]) - Progression Note    Patient Details  Name: Samantha [name] MRN: [id number] Date of Birth: Aug 25, 1940  Transition of Care [facility]) CM/SW [place], [place] Phone Number: 04/03/2020, 9:46 AM  Clinical Narrative:   CSW noting therapy recommendations for SNF. Per previous discussion with son, agreeable to considering SNF depending on bed offers. CSW faxed out referral, will follow up with son on bed offers received for him to review. CSW to follow.    Expected Discharge Plan: Skilled Nursing Facility Barriers to Discharge: Continued Medical Work up (PT/OT ordered but has not evaluated patient yet)  Expected Discharge Plan and Services Expected Discharge Plan: [place] In-house Referral: Clinical Social Work     Living arrangements for the past 2 months: [place] [employer])                                       Social Determinants of Health (SDOH) Interventions    Readmission Risk Interventions No flowsheet data found.

## 2020-04-04 DIAGNOSIS — G9341 Metabolic encephalopathy: Secondary | ICD-10-CM | POA: Diagnosis not present

## 2020-04-04 DIAGNOSIS — N390 Urinary tract infection, site not specified: Secondary | ICD-10-CM | POA: Diagnosis not present

## 2020-04-04 LAB — COMPREHENSIVE METABOLIC PANEL
ALT: 55 U/L — ABNORMAL HIGH (ref 0–44)
AST: 56 U/L — ABNORMAL HIGH (ref 15–41)
Albumin: 2.6 g/dL — ABNORMAL LOW (ref 3.5–5.0)
Alkaline Phosphatase: 58 U/L (ref 38–126)
Anion gap: 10 (ref 5–15)
BUN: 22 mg/dL (ref 8–23)
CO2: 25 mmol/L (ref 22–32)
Calcium: 9.1 mg/dL (ref 8.9–10.3)
Chloride: 112 mmol/L — ABNORMAL HIGH (ref 98–111)
Creatinine, Ser: 1.02 mg/dL — ABNORMAL HIGH (ref 0.44–1.00)
GFR, Estimated: 53 mL/min — ABNORMAL LOW (ref >60)
Glucose, Bld: 124 mg/dL — ABNORMAL HIGH (ref 70–99)
Potassium: 3.6 mmol/L (ref 3.5–5.1)
Sodium: 147 mmol/L — ABNORMAL HIGH (ref 135–145)
Total Bilirubin: 0.9 mg/dL (ref 0.3–1.2)
Total Protein: 6 g/dL — ABNORMAL LOW (ref 6.5–8.1)

## 2020-04-04 LAB — CULTURE, BLOOD (SINGLE)
Culture: NO GROWTH
Special Requests: ADEQUATE

## 2020-04-04 LAB — CBC
HCT: 38.9 % (ref 36.0–46.0)
Hemoglobin: 12.2 g/dL (ref 12.0–15.0)
MCH: 31.9 pg (ref 26.0–34.0)
MCHC: 31.4 g/dL (ref 30.0–36.0)
MCV: 101.6 fL — ABNORMAL HIGH (ref 80.0–100.0)
Platelets: 164 10*3/uL (ref 150–400)
RBC: 3.83 MIL/uL — ABNORMAL LOW (ref 3.87–5.11)
RDW: 13.5 % (ref 11.5–15.5)
WBC: 6.5 10*3/uL (ref 4.0–10.5)
nRBC: 0 % (ref 0.0–0.2)

## 2020-04-04 LAB — GLUCOSE, CAPILLARY
Glucose-Capillary: 113 mg/dL — ABNORMAL HIGH (ref 70–99)
Glucose-Capillary: 176 mg/dL — ABNORMAL HIGH (ref 70–99)
Glucose-Capillary: 96 mg/dL (ref 70–99)

## 2020-04-04 MED ORDER — DIPHENHYDRAMINE HCL 25 MG PO CAPS: 25.0000 mg | ORAL_CAPSULE | Freq: Three times a day (TID) | ORAL | Status: DC | PRN

## 2020-04-04 MED ORDER — QUETIAPINE FUMARATE 25 MG PO TABS
25.0000 mg | ORAL_TABLET | Freq: Two times a day (BID) | ORAL | Status: DC
  Administered 2020-04-04 – 2020-04-06 (×5): 25 mg via ORAL
  Filled 2020-04-04 (×5): qty 1

## 2020-04-04 MED ORDER — QUETIAPINE FUMARATE 25 MG PO TABS: 25.0000 mg | ORAL_TABLET | Freq: Every day | ORAL | Status: DC

## 2020-04-04 MED ORDER — HALOPERIDOL LACTATE 5 MG/ML IJ SOLN
1.0000 mg | Freq: Four times a day (QID) | INTRAMUSCULAR | Status: DC | PRN
  Administered 2020-04-04 – 2020-04-06 (×3): 1 mg via INTRAVENOUS
  Filled 2020-04-04 (×3): qty 1

## 2020-04-04 MED ORDER — POTASSIUM CHLORIDE CRYS ER 20 MEQ PO TBCR
20.0000 meq | EXTENDED_RELEASE_TABLET | Freq: Once | ORAL | Status: AC
  Administered 2020-04-04: 20 meq via ORAL
  Filled 2020-04-04: qty 1

## 2020-04-04 MED ORDER — CEPHALEXIN 500 MG PO CAPS
500.0000 mg | ORAL_CAPSULE | Freq: Three times a day (TID) | ORAL | Status: DC
  Administered 2020-04-04 – 2020-04-06 (×7): 500 mg via ORAL
  Filled 2020-04-04 (×7): qty 1

## 2020-04-04 MED ORDER — DIPHENHYDRAMINE HCL 25 MG PO CAPS
25.0000 mg | ORAL_CAPSULE | Freq: Once | ORAL | Status: AC | PRN
  Administered 2020-04-04: 25 mg via ORAL
  Filled 2020-04-04: qty 1

## 2020-04-04 NOTE — Progress Notes (Signed)
Physical Therapy Treatment Patient Details Name: Samantha [name] MRN: [id number] DOB: Oct 10, 1940 Today's Date: 04/04/2020    History of Present Illness The pt is a 79 yo female presenting from [facility] independent living facility with AMS. Upon work-up, pt found to have UTI, sepsis, and AKI. PMH includes: CHF with EF of 30%, AICD, renal artery aneurysm, CKD III HTN, colon cancer, OSA on CPAP, frequent UTI, and dementia.     PT Comments    Pt received in bed, sleeping but easily aroused. Confused and disoriented. She required min assist bed mobility. Upon sitting EOB, pt impulsively returned to supine x 2. Encouragement and cueing needed to stay engaged in session. Min assist sit to stand and min assist ambulation 5' with RW. Pt unsteady with poor RW management. She ambulates without AD at baseline. Gait distance limited by poor safety awareness and pt's continued attempts to return to supine in bed. Pt in bed at end of session. Eyes closed before therapist's left room.     Follow Up Recommendations  SNF;Supervision/Assistance - 24 hour     Equipment Recommendations  Other (comment) (defer to post acute)    Recommendations for Other Services       Precautions / Restrictions Precautions Precautions: Fall;Other (comment) Precaution Comments: dementia at baseline, AMS    Mobility  Bed Mobility Overal bed mobility: Needs Assistance Bed Mobility: Supine to Sit;Sit to Supine     Supine to sit: Min assist;HOB elevated Sit to supine: Min guard;HOB elevated   General bed mobility comments: +rail, assist to elevate trunk. Pt impulsively returning to supine x 2 during attempt to transition OOB.  Transfers Overall transfer level: Needs assistance Equipment used: Rolling walker (2 wheeled) Transfers: Sit to/from Stand Sit to Stand: Min assist         General transfer comment: assist to power up and stabilize balance. Pt very impulsive and  unsafe.  Ambulation/Gait Ambulation/Gait assistance: Min assist Gait Distance (Feet): 5 Feet Assistive device: Rolling walker (2 wheeled) Gait Pattern/deviations: Step-through pattern;Decreased stride length     General Gait Details: unsteady gait with RW. Poor RW management with pt stepping outside the frame. Per chart, pt amb without AD at baseline. Assist to maintain balance. Distance limited by safety and pt's continued attempts to return to bed.   Stairs             Wheelchair Mobility    Modified Rankin (Stroke Patients Only)       Balance Overall balance assessment: Needs assistance Sitting-balance support: Feet supported Sitting balance-Leahy Scale: Fair     Standing balance support: Bilateral upper extremity supported;During functional activity Standing balance-Leahy Scale: Poor Standing balance comment: reliant on external support                            Cognition Arousal/Alertness: Awake/alert Behavior During Therapy: Flat affect Overall Cognitive Status: Impaired/Different from baseline Area of Impairment: Orientation;Attention;Memory;Following commands;Safety/judgement;Problem solving;Awareness                 Orientation Level: Disoriented to;Time;Situation;Place;Person Current Attention Level: Focused Memory: Decreased recall of precautions;Decreased short-term memory Following Commands: Follows one step commands inconsistently;Follows one step commands with increased time Safety/Judgement: Decreased awareness of deficits;Decreased awareness of safety Awareness: Intellectual Problem Solving: Slow processing;Difficulty sequencing;Requires verbal cues;Decreased initiation General Comments: Pt stating "I don't know anything" to all questions asked. Very startled/confused facial expression during session.      Exercises  General Comments        Pertinent Vitals/Pain Pain Assessment: No/denies pain    Home Living                       Prior Function            PT Goals (current goals can now be found in the care plan section) Acute Rehab PT Goals Patient Stated Goal: to lay back down Progress towards PT goals: Progressing toward goals    Frequency    Min 2X/week      PT Plan Current plan remains appropriate    Co-evaluation              AM-PAC PT "6 Clicks" Mobility   Outcome Measure  Help needed turning from your back to your side while in a flat bed without using bedrails?: A Little Help needed moving from lying on your back to sitting on the side of a flat bed without using bedrails?: A Little Help needed moving to and from a bed to a chair (including a wheelchair)?: A Lot Help needed standing up from a chair using your arms (e.g., wheelchair or bedside chair)?: A Little Help needed to walk in hospital room?: A Lot Help needed climbing 3-5 steps with a railing? : Total 6 Click Score: 14    End of Session Equipment Utilized During Treatment: Gait belt Activity Tolerance: Other (comment) (AMS, impulsive, desire to stay in bed) Patient left: in bed;with call bell/phone within reach;with bed alarm set Nurse Communication: Mobility status PT Visit Diagnosis: Difficulty in walking, not elsewhere classified (R26.2)     Time: 6812-7517 PT Time Calculation (min) (ACUTE ONLY): 15 min  Charges:  $Therapeutic Activity: 8-22 mins                     Lorrin Goodell, PT  Office # [phone] Pager [phone]    Lorriane Shire 04/04/2020, 12:00 PM

## 2020-04-04 NOTE — Progress Notes (Addendum)
  Speech Language Pathology Treatment: Dysphagia  Patient Details Name: Samantha [name] MRN: [id number] DOB: 09-04-1940 Today's Date: 04/04/2020 Time: 1040-1105 SLP Time Calculation (min) (ACUTE ONLY): 25 min  Assessment / Plan / Recommendation Clinical Impression  Pt was seen at bedside to assess tolerance of current (puree/thin) diet and determine appropriateness to advance. Pt was sleeping upon arrival of SLP, but roused easily with gentle verbal stim. She was pleasant and cooperative with unfamiliar therapist. Pt accepted trials of thin liquid and puree texture without overt s/s aspiration on either consistency. Pt was noted to hold boluses orally, and required intermittent cues to swallow oral contents. No obvious oral residue or anterior leakage noted. Pt continues to be quite confused (Ox person only), but was able to follow simple commands during po trials. Recommend continuing with puree diet given oral holding and need for cues - consistencies requiring mastication would unnecessarily place pt at increased risk for aspiration. Safe swallow precautions posted at [facility]. Will continue to follow for readiness to advance textures.   HPI HPI: 79 y.o. 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  Dx sepsis, UTI, AKI, metabolic encephalopathy.       SLP Plan  Continue with current plan of care       Recommendations  Diet recommendations: Dysphagia 1 (puree);Thin liquid Liquids provided via:  Straw;Cup Medication Administration: Crushed with puree Supervision: Full supervision/cueing for compensatory strategies Compensations: Minimize environmental distractions;Slow rate;Small sips/bites Postural Changes and/or Swallow Maneuvers: Out of bed for meals;Seated upright 90 degrees;Upright 30-60 min after meal                Oral Care Recommendations: Oral care BID Follow up Recommendations: Other (comment) SLP Visit Diagnosis: Dysphagia, oral phase (R13.11) Plan: Continue with current plan of care       [place]. Quentin Ore, [facility], [place] Speech Language Pathologist Office: [phone]  Samantha [name] 04/04/2020, 11:12 AM

## 2020-04-04 NOTE — Progress Notes (Signed)
[facility] PROGRESS NOTE   Samantha [name] TMA:[id number] DOB: 1940/12/17 DOA: 03/29/2020  PCP: Haywood Pao, MD  Brief History/Interval Summary: 79 y.o.femalewith medical history significant fornonischemic cardiomegaly, heart failure with reduced ejection fraction, history of small bowel obstruction, left bundle branch block status post bivalve the ICD insertionin 05/2016,renal artery aneurysm, s/p cardiac catheterization in 2000,dementia/chronic memory loss/vascular dementia without behavioral disturbance, hypertension, OSA on CPAP nightly, history of frequent UTI,history of closed colles fracture of left radius in 2016,anal cancer who presents from nursing home for mental statuschangeafter being discovered for not coming down for breakfast.  Patient was noted to have acute renal failure and hyponatremia.  There was concern for a urinary tract infection.  Patient was hospitalized for further management. Per daughter-in-law patient has received 2 doses of Covid 19 vaccination.  Reason for Visit: Acute metabolic encephalopathy.  Acute kidney injury.  Consultants: None  Procedures: None  Antibiotics: Anti-infectives (From admission, onward)   Start     Dose/Rate Route Frequency Ordered Stop   04/02/20 1400  cephALEXin (KEFLEX) capsule 500 mg        500 mg Oral Every 8 hours 04/02/20 0948     03/31/20 1445  cefTRIAXone (ROCEPHIN) 1 g in sodium chloride 0.9 % 100 mL IVPB  Status:  Discontinued        1 g 200 mL/hr over 30 Minutes Intravenous Every 24 hours 03/31/20 1346 04/02/20 0948   03/30/20 2200  vancomycin (VANCOCIN) IVPB 1000 mg/200 mL premix  Status:  Discontinued        1,000 mg 200 mL/hr over 60 Minutes Intravenous Every 24 hours 03/30/20 0826 03/31/20 1346   03/30/20 1600  ceFEPIme (MAXIPIME) 2 g in sodium chloride 0.9 % 100 mL IVPB  Status:  Discontinued        2 g 200 mL/hr over 30 Minutes Intravenous Every 24 hours 03/29/20 1637 03/31/20 1346    03/29/20 1645  vancomycin variable dose per unstable renal function (pharmacist dosing)  Status:  Discontinued         Does not apply See admin instructions 03/29/20 1645 03/31/20 1346   03/29/20 1630  ceFEPIme (MAXIPIME) 2 g in sodium chloride 0.9 % 100 mL IVPB        2 g 200 mL/hr over 30 Minutes Intravenous  Once 03/29/20 1617 03/29/20 1752   03/29/20 1630  metroNIDAZOLE (FLAGYL) IVPB 500 mg        500 mg 100 mL/hr over 60 Minutes Intravenous  Once 03/29/20 1617 03/29/20 1917   03/29/20 1630  vancomycin (VANCOCIN) IVPB 1000 mg/200 mL premix  Status:  Discontinued        1,000 mg 200 mL/hr over 60 Minutes Intravenous  Once 03/29/20 1617 03/29/20 1627   03/29/20 1630  vancomycin (VANCOREADY) IVPB 1750 mg/350 mL        1,750 mg 175 mL/hr over 120 Minutes Intravenous  Once 03/29/20 1627 03/29/20 2032      Subjective/Interval History: Overnight events noted.  Patient was given Benadryl for itching.  Prior to that she was also noted to be a little bit more agitated.  Had to be restrained.  Somnolent this morning though easily arousable.  Discussed with nursing staff try to remove restraints today.     Assessment/Plan:  Acute metabolic encephalopathy in the setting of dementia/acute delirium Reason for her encephalopathy is multifactorial including sepsis, metabolic derangements, dehydration.  CT head did not show any acute findings.   Patient's mentation has improved though she remains confused.  Episodes of agitation could be due to hospital delirium.  Recovery will likely be very slow and gradual.  She may never get back to her baseline.  Could consider low-dose Seroquel at nighttime which may help with  possible sundowning. She does have underlying dementia.  Sepsis, present on admission secondary to urinary source, with organ dysfunction Patient had elevated lactic acid level, initially 2.9.  Subsequently increased to 3.2 and then to 2.8.  Procalcitonin 0.47.    Patient was given  broad-spectrum antibiotics.  Cultures were negative.  Vancomycin was discontinued.  Cefepime was changed over to ceftriaxone.  Sepsis physiology resolved.  Urinary tract infection secondary to E. coli Abnormal UA was noted.  Urine culture with only 4000 colonies of E. coli.  Blood cultures negative.  He was on cefepime which was changed over to ceftriaxone.  Patient was subsequently changed over to cephalexin.  Plan for 7-day treatment.    Acute kidney injury on chronic kidney disease stage IIIa/hypernatremia/hypokalemia Baseline creatinine is around 1.3.  Presented with creatinine of 2.8. Renal failure most likely due to hypovolemia in the setting of ARB use.  Patient was aggressively hydrated. IV fluids were discontinued. Renal function is back to baseline.  Potassium was noted to be normal today.  Slightly elevated sodium level noted.  Transaminitis Mildly elevated LFTs noted.  Abdomen has been benign.  Bilirubin normal today.  Right upper quadrant ultrasound did not show any acute process.  She is status post cholecystectomy.   Macrocytosis Folate 22.3.  B12 noted to be borderline low at 225.  TSH 0.756.  Vitamin B12 deficiency B12 level noted to be borderline low at 225.  Started on vitamin B12.  History of chronic systolic CHF She is status post ICD placement.  Echocardiogram available from 2017 which showed a EF of 30%.  Most of her cardio medications are on hold due to low blood pressure.  Continue to hold her Cozaar and furosemide.   Carvedilol was resumed at a lower dose yesterday.  Blood pressure seems to be stable.  History of SVT Stable.  Monitor on telemetry.  History of obstructive sleep apnea on CPAP Continue CPAP.  Essential hypertension Blood pressure low but stable.  Slightly better than before.  Low-dose carvedilol as discussed above.  History of anxiety and depression Citalopram was resumed yesterday.  Unclear if this has anything to do with her agitation  yesterday.  History of dementia and memory disturbance Continue Namenda.  Avoid benzodiazepines and Benadryl as much as possible.  History of restless leg syndrome Requip is on hold.  Obesity Estimated body mass index is 34.37 kg/m as calculated from the following:   Height as of this encounter: 5\' 3"  (1.6 m).   Weight as of this encounter: 88 kg.  DVT Prophylaxis: Subcutaneous heparin Code Status: DNR Family Communication:  Son being updated every so often.  [place]. Disposition Plan:  Seen by PT.  SNF is recommended.  Apparently came from [place].    Status is: Inpatient  Remains inpatient appropriate because:Altered mental status and IV treatments appropriate due to intensity of illness or inability to take PO   Dispo:  Patient From: Independent living facility  Planned Disposition: [place]  Expected discharge date: 04/05/20  Medically stable for discharge: No       Medications:  Scheduled: . carvedilol  3.125 mg Oral BID WC  . cephALEXin  500 mg Oral Q8H  . citalopram  20 mg Oral Daily  . heparin  5,000 Units  Subcutaneous Q8H  . insulin aspart  0-9 Units Subcutaneous TID WC  . memantine  10 mg Oral BID  . vitamin B-12  500 mcg Oral Daily   Continuous:  PRN:   Objective:  Vital Signs  Vitals:   04/03/20 1759 04/03/20 2011 04/04/20 0500 04/04/20 0919  BP: 128/62 124/61 (!) 110/49 122/73  Pulse: 96 92 85 91  Resp: 18   17  Temp: 98.2 F (36.8 C) 97.6 F (36.4 C) 97.8 F (36.6 C) 97.9 F (36.6 C)  TempSrc:  Oral Oral   SpO2: 94% 97% 93% 94%  Weight:      Height:        Intake/Output Summary (Last 24 hours) at 04/04/2020 1051 Last data filed at 04/04/2020 1010 Gross per 24 hour  Intake 360 ml  Output 0 ml  Net 360 ml   Filed Weights   03/29/20 1815 03/31/20 2012 04/02/20 2010  Weight: 88 kg 83.7 kg 83.4 kg    General appearance: Somnolent this morning though easily arousable Resp: Clear to auscultation  bilaterally.  Normal effort Cardio: S1-S2 is normal regular.  No S3-S4.  No rubs murmurs or bruit GI: Abdomen is soft.  Nontender nondistended.  Bowel sounds are present normal.  No masses organomegaly Extremities: Moving all her extremities Neurologic: Remains confused.  No focal deficits.   Lab Results:  Data Reviewed: I have personally reviewed following labs and imaging studies  CBC: Recent Labs  Lab 03/29/20 1507 03/30/20 0456 03/31/20 0231 04/01/20 0335 04/02/20 0548 04/03/20 0310 04/04/20 0614  WBC 15.1*   < > 17.0* 12.3* 7.4 8.0 6.5  NEUTROABS 13.2*  --   --   --   --   --   --   HGB 14.7   < > 13.9 12.1 11.8* 13.1 12.2  HCT 49.3*   < > 46.2* 39.6 37.7 42.0 38.9  MCV 104.4*   < > 106.5* 101.5* 103.3* 102.7* 101.6*  PLT 187   < > 106* 129* 169 143* 164   < > = values in this interval not displayed.    Basic Metabolic Panel: Recent Labs  Lab 03/31/20 1523 04/01/20 0335 04/02/20 0548 04/03/20 0310 04/04/20 0614  NA 153* 150* 145 145 147*  K 3.8 4.1 4.4 5.4* 3.6  CL 120* 119* 114* 115* 112*  CO2 24 23 22  19* 25  GLUCOSE 176* 162* 159* 96 124*  BUN 39* 32* 25* 23 22  CREATININE 1.30* 1.25* 1.04* 0.96 1.02*  CALCIUM 8.6* 8.7* 8.5* 8.8* 9.1    GFR: Estimated Creatinine Clearance: 46.5 mL/min (A) (by C-G formula based on SCr of 1.02 mg/dL (H)).  Liver Function Tests: Recent Labs  Lab 03/31/20 0231 04/01/20 0335 04/02/20 0548 04/03/20 0310 04/04/20 0614  AST 70* 63* 54* 65* 56*  ALT 65* 57* 53* 49* 55*  ALKPHOS 53 50 59 63 58  BILITOT 1.1 0.9 1.0 1.8* 0.9  PROT 6.5 6.1* 5.8* 5.8* 6.0*  ALBUMIN 3.0* 2.5* 2.4* 2.6* 2.6*     Coagulation Profile: Recent Labs  Lab 03/29/20 1507 03/30/20 0456  INR 1.3* 1.5*    CBG: Recent Labs  Lab 04/03/20 0640 04/03/20 1134 04/03/20 1648 04/03/20 2126 04/04/20 0837  GLUCAP 135* 178* 147* 161* 113*     Recent Results (from the past 240 hour(s))  Culture, blood (Routine x 2)     Status: None    Collection Time: 03/29/20  3:21 PM   Specimen: BLOOD  Result Value Ref Range Status  Specimen Description BLOOD SITE NOT SPECIFIED  Final   Special Requests   Final    BOTTLES DRAWN AEROBIC AND ANAEROBIC Blood Culture adequate volume   Culture   Final    NO GROWTH 5 DAYS Performed at [facility], [address] [place], [place] [address]    Report Status 04/03/2020 FINAL  Final  Culture, blood (Routine x 2)     Status: None   Collection Time: 03/29/20  4:39 PM   Specimen: BLOOD  Result Value Ref Range Status   Specimen Description BLOOD SITE NOT SPECIFIED  Final   Special Requests   Final    BOTTLES DRAWN AEROBIC AND ANAEROBIC Blood Culture adequate volume   Culture   Final    NO GROWTH 5 DAYS Performed at [facility], [place] [address] [place], [place] [address]    Report Status 04/03/2020 FINAL  Final  Respiratory Panel by RT PCR (Flu A&B, Covid) - Nasopharyngeal Swab     Status: None   Collection Time: 03/29/20  6:30 PM   Specimen: Nasopharyngeal Swab  Result Value Ref Range Status   SARS Coronavirus 2 by RT PCR NEGATIVE NEGATIVE Final    Comment: (NOTE) SARS-CoV-2 target nucleic acids are NOT DETECTED.  The SARS-CoV-2 RNA is generally detectable in upper respiratoy specimens during the acute phase of infection. The lowest concentration of SARS-CoV-2 viral copies this assay can detect is 131 copies/mL. A negative result does not preclude SARS-Cov-2 infection and should not be used as the sole basis for treatment or other patient management decisions. A negative result may occur with  improper specimen collection/handling, submission of specimen other than nasopharyngeal swab, presence of viral mutation(s) within the areas targeted by this assay, and inadequate number of viral copies (<131 copies/mL). A negative result must be combined with clinical observations, patient history, and epidemiological information. The expected result is Negative.  Fact Sheet  for Patients:  PinkCheek.be  Fact Sheet for Healthcare Providers:  GravelBags.it  This test is no t yet approved or cleared by the [place] FDA and  has been authorized for detection and/or diagnosis of SARS-CoV-2 by FDA under an Emergency Use Authorization (EUA). This EUA will remain  in effect (meaning this test can be used) for the duration of the COVID-19 declaration under Section 564(b)(1) of the Act, 21 U.S.C. section 360bbb-3(b)(1), unless the authorization is terminated or revoked sooner.     Influenza A by PCR NEGATIVE NEGATIVE Final   Influenza B by PCR NEGATIVE NEGATIVE Final    Comment: (NOTE) The Xpert Xpress SARS-CoV-2/FLU/RSV assay is intended as an aid in  the diagnosis of influenza from Nasopharyngeal swab specimens and  should not be used as a sole basis for treatment. Nasal washings and  aspirates are unacceptable for Xpert Xpress SARS-CoV-2/FLU/RSV  testing.  Fact Sheet for Patients: PinkCheek.be  Fact Sheet for Healthcare Providers: GravelBags.it  This test is not yet approved or cleared by the [place] FDA and  has been authorized for detection and/or diagnosis of SARS-CoV-2 by  FDA under an Emergency Use Authorization (EUA). This EUA will remain  in effect (meaning this test can be used) for the duration of the  Covid-19 declaration under Section 564(b)(1) of the Act, 21  U.S.C. section 360bbb-3(b)(1), unless the authorization is  terminated or revoked. Performed at [facility], [place] [address] [place], [place] [address]   Urine culture     Status: Abnormal   Collection Time: 03/29/20  7:29 PM   Specimen: In/Out Cath Urine  Result Value Ref Range Status   Specimen Description IN/OUT CATH URINE  Final   Special Requests   Final    NONE Performed at [facility], [address] [place], [place] [address]     Culture 4,000 COLONIES/mL ESCHERICHIA COLI (A)  Final   Report Status 03/31/2020 FINAL  Final   Organism ID, Bacteria ESCHERICHIA COLI (A)  Final      Susceptibility   Escherichia coli - MIC*    AMPICILLIN >=32 RESISTANT Resistant     CEFAZOLIN <=4 SENSITIVE Sensitive     CEFTRIAXONE <=0.25 SENSITIVE Sensitive     CIPROFLOXACIN <=0.25 SENSITIVE Sensitive     GENTAMICIN >=16 RESISTANT Resistant     IMIPENEM <=0.25 SENSITIVE Sensitive     NITROFURANTOIN <=16 SENSITIVE Sensitive     TRIMETH/SULFA >=320 RESISTANT Resistant     AMPICILLIN/SULBACTAM 16 INTERMEDIATE Intermediate     PIP/TAZO <=4 SENSITIVE Sensitive     * 4,000 COLONIES/mL ESCHERICHIA COLI  Blood culture (routine single)     Status: None   Collection Time: 03/30/20  4:56 AM   Specimen: BLOOD LEFT HAND  Result Value Ref Range Status   Specimen Description BLOOD LEFT HAND  Final   Special Requests   Final    BOTTLES DRAWN AEROBIC AND ANAEROBIC Blood Culture adequate volume   Culture   Final    NO GROWTH 5 DAYS Performed at [facility], [address] [place], [place] [address]    Report Status 04/04/2020 FINAL  Final      Radiology Studies: US Abdomen Limited RUQ (LIVER/GB)  Result Date: 04/03/2020 CLINICAL DATA:  Elevated LFTs, history of prior cholecystectomy EXAM: ULTRASOUND ABDOMEN LIMITED RIGHT UPPER QUADRANT COMPARISON:  None. FINDINGS: Gallbladder: Surgically removed. Common bile duct: Diameter: Dilated to 13.3 mm consistent with the post cholecystectomy state. Liver: No focal lesion identified. Within normal limits in parenchymal echogenicity. Portal vein is patent on color Doppler imaging with normal direction of blood flow towards the liver. Other: None. IMPRESSION: Status post cholecystectomy with biliary dilatation expected due to the postsurgical state. No acute abnormality is noted. Electronically Signed   By: Inez Catalina M.D.   On: 04/03/2020 18:01       LOS: 6 days   [facility] Pager on [email or web address]  04/04/2020, 10:51 AM

## 2020-04-04 NOTE — Progress Notes (Signed)
Pt very confused and keeps trying to get OOB. RN has tried several times to redirect and reorient pt without any success. For patients safety RN messaged MD on call for a posey belt to help prevent pt from falling. Pt was repositioned in bed and posey belt applied. RN will continue to monitor.   Eleanora Neighbor, RN

## 2020-04-05 ENCOUNTER — Ambulatory Visit (INDEPENDENT_AMBULATORY_CARE_PROVIDER_SITE_OTHER): Payer: Medicare Other

## 2020-04-05 DIAGNOSIS — N390 Urinary tract infection, site not specified: Secondary | ICD-10-CM | POA: Diagnosis not present

## 2020-04-05 DIAGNOSIS — I5022 Chronic systolic (congestive) heart failure: Secondary | ICD-10-CM

## 2020-04-05 LAB — BASIC METABOLIC PANEL
Anion gap: 9 (ref 5–15)
BUN: 19 mg/dL (ref 8–23)
CO2: 24 mmol/L (ref 22–32)
Calcium: 9.2 mg/dL (ref 8.9–10.3)
Chloride: 115 mmol/L — ABNORMAL HIGH (ref 98–111)
Creatinine, Ser: 0.93 mg/dL (ref 0.44–1.00)
GFR, Estimated: 59 mL/min — ABNORMAL LOW (ref >60)
Glucose, Bld: 125 mg/dL — ABNORMAL HIGH (ref 70–99)
Potassium: 4 mmol/L (ref 3.5–5.1)
Sodium: 148 mmol/L — ABNORMAL HIGH (ref 135–145)

## 2020-04-05 LAB — GLUCOSE, CAPILLARY
Glucose-Capillary: 110 mg/dL — ABNORMAL HIGH (ref 70–99)
Glucose-Capillary: 117 mg/dL — ABNORMAL HIGH (ref 70–99)
Glucose-Capillary: 145 mg/dL — ABNORMAL HIGH (ref 70–99)
Glucose-Capillary: 122 mg/dL — ABNORMAL HIGH (ref 70–99)
Glucose-Capillary: 143 mg/dL — ABNORMAL HIGH (ref 70–99)

## 2020-04-05 MED ORDER — CYANOCOBALAMIN 500 MCG PO TABS: 500.0000 ug | ORAL_TABLET | Freq: Every day | ORAL | Status: AC

## 2020-04-05 MED ORDER — CEPHALEXIN 500 MG PO CAPS: 500.0000 mg | ORAL_CAPSULE | Freq: Three times a day (TID) | ORAL | 0 refills | Status: AC

## 2020-04-05 MED ORDER — CARVEDILOL 3.125 MG PO TABS
3.1250 mg | ORAL_TABLET | Freq: Two times a day (BID) | ORAL | Status: DC
Start: 2020-04-05 — End: 2020-05-10

## 2020-04-05 MED ORDER — QUETIAPINE FUMARATE 25 MG PO TABS: 25.0000 mg | ORAL_TABLET | Freq: Two times a day (BID) | ORAL | 0 refills | Status: AC

## 2020-04-05 NOTE — Plan of Care (Signed)
  Problem: Elimination: Goal: Will not experience complications related to bowel motility Outcome: Progressing   

## 2020-04-05 NOTE — TOC Progression Note (Deleted)
Transition of Care [facility]) - Progression Note    Patient Details  Name: Samantha [name] MRN: [id number] Date of Birth: 04/23/1941  Transition [facility]) CM/SW Contact  Sharlet Salina Mila Homer, LCSW Phone Number: 04/05/2020, 11:43 AM  Clinical Narrative:  Call made to son, Gerald Stabs [phone]) to advise him of patient's readiness for discharge and to confirm that patient will return to [facility], and this is the plan. Call made to facility and message left for administrator, Pixie Casino. Discharge summary sent to facility via Epic.Marland Kitchen      Expected Discharge Plan: [place] Barriers to Discharge: Continued Medical Work up (PT/OT ordered but has not evaluated patient yet)  Expected Discharge Plan and Services Expected Discharge Plan: [place] In-house Referral: Clinical Social Work     Living arrangements for the past 2 months: [place] ([place]) Expected Discharge Date: 04/05/20                                     Social Determinants of Health (SDOH) Interventions    Readmission Risk Interventions No flowsheet data found.

## 2020-04-05 NOTE — Discharge Summary (Addendum)
[facility]  Physician Discharge Summary   Patient ID: Samantha [name] MRN: [id number] DOB/AGE: Sep 14, 1940 79 y.o.  Admit date: 03/29/2020 Discharge date: 04/06/2020 PCP: Haywood Pao, MD  DISCHARGE DIAGNOSES:  Acute metabolic encephalopathy in the setting of dementia/acute delirium, improving Sepsis secondary to E. coli UTI Urinary tract infection secondary to E. coli Acute kidney injury, resolved Chronic kidney disease stage IIIa Hypernatremia Transaminitis Vitamin B12 deficiency Chronic systolic CHF History of SVT History of obstructive sleep apnea on CPAP History of anxiety and depression History of dementia Restless leg syndrome Obesity   RECOMMENDATIONS FOR OUTPATIENT FOLLOW UP: 1. Please check complete metabolic panel and CBC in 3 to 4 days 2. Speech therapy to reevaluate swallow function at SNF    Home Health: Going to SNF Equipment/Devices: None  CODE STATUS: DNR  DISCHARGE CONDITION: fair  Diet recommendation: Dysphagia 1 diet with thin liquids  INITIAL HISTORY: 79 y.o.femalewith medical history significant fornonischemic cardiomegaly, heart failure with reduced ejection fraction, history of small bowel obstruction, left bundle branch block status post bivalve the ICD insertionin 05/2016,renal artery aneurysm, s/p cardiac catheterization in 2000,dementia/chronic memory loss/vascular dementia without behavioral disturbance, hypertension, OSA on CPAP nightly, history of frequent UTI,history of closed colles fracture of left radius in 2016,anal cancer who presents from nursing home for mental statuschangeafter being discovered for not coming down for breakfast. Patient was noted to have acute renal failure and hyponatremia. There was concern for a urinary tract infection. Patient was hospitalized for further management. Per daughter-in-law patient has received 2 doses of Covid 19 vaccination.   HOSPITAL COURSE:   Acute metabolic  encephalopathy in the setting of dementia/acute delirium Reason for her encephalopathy is multifactorial including sepsis, metabolic derangements, dehydration. CT head did not show any acute findings.  Patient's mentation has improved though she remains confused.  Episodes of agitation could be due to hospital delirium as well as her underlying anxiety and depression.  Recovery will likely be very slow and gradual.  She may never get back to her baseline.   Patient was started on Celexa which she takes at home but did not show any improvement.  Switched over to Seroquel which seems to have helped.    Sepsis, present on admission secondary to urinary source, with organ dysfunction Patient had elevated lactic acid level, initially 2.9. Subsequently increased to 3.2 and then to 2.8. Procalcitonin 0.47.   Patient was given broad-spectrum antibiotics.  Cultures were negative.  Vancomycin was discontinued.  Cefepime was changed over to ceftriaxone.  Sepsis physiology resolved.  Now on oral antibiotics.  Urinary tract infection secondary to E. coli Abnormal UA was noted.  Urine culture with only 4000 colonies of E. coli.  Blood cultures negative.  He was on cefepime which was changed over to ceftriaxone.  Patient was subsequently changed over to cephalexin.  Plan for 7-day treatment.    Acute kidney injury on chronic kidney disease stage IIIa/hypernatremia/hypokalemia Baseline creatinine is around 1.3. Presented with creatinine of 2.8. Renal failure most likely due to hypovolemia in the setting of ARB use. Patient was aggressively hydrated. IV fluids were discontinued. Renal function is back to baseline.    Electrolytes are stable.  Sodium noted to be slightly elevated.  Likely due to free water deficits.  Patient encouraged to eat and drink.  Will need to be rechecked in a few days at [facility]    Transaminitis Mildly elevated LFTs noted.  Abdomen has been benign.  Right upper quadrant ultrasound  did not show any  acute process.  She is status post cholecystectomy.   Macrocytosis Folate 22.3. B12 noted to be borderline low at 225. TSH 0.756.  Vitamin B12 deficiency B12 level noted to be borderline low at 225.  Started on vitamin B12.  History of chronic systolic CHF She is status post ICD placement. Echocardiogram available from 2017 which showed a EF of 30%.  Most of her cardiac medications are on hold due to low blood pressure.  Continue to hold her Cozaar and furosemide.   Carvedilol was resumed at a lower dose.  Blood pressure seems to be stable.  History of SVT Stable. Monitor on telemetry.  History of obstructive sleep apnea on CPAP Continue CPAP.  Essential hypertension Blood pressure low but stable.  Low-dose carvedilol as discussed above.  Holding her Cozaar and furosemide.  History of anxiety and depression Patient did not tolerate citalopram well.  She was noted to be quite anxious and agitated.  Started on Seroquel with improvement.  Citalopram discontinued.    History of dementia and memory disturbance Continue Namenda.  Avoid benzodiazepines and Benadryl as much as possible.  History of restless leg syndrome Requip is on hold.  Could consider resuming depending on how she does at [facility].  Obesity Estimated body mass index is 34.37 kg/m as calculated from the following: Height as of this encounter: 5\' 3"  (1.6 m). Weight as of this encounter: 88 kg.   Addendum on 04/06/20 Patient was supposed to be discharged to SNF on 04/05/2020 pending insurance authorization.   She is currently medically stable for discharge.  PERTINENT LABS:  The results of significant diagnostics from this hospitalization (including imaging, microbiology, ancillary and laboratory) are listed below for reference.    Microbiology: Recent Results (from the past 240 hour(s))  Culture, blood (Routine x 2)     Status: None   Collection Time: 03/29/20  3:21 PM    Specimen: BLOOD  Result Value Ref Range Status   Specimen Description BLOOD SITE NOT SPECIFIED  Final   Special Requests   Final    BOTTLES DRAWN AEROBIC AND ANAEROBIC Blood Culture adequate volume   Culture   Final    NO GROWTH 5 DAYS Performed at [facility], [address] [place], [place] [address]    Report Status 04/03/2020 FINAL  Final  Culture, blood (Routine x 2)     Status: None   Collection Time: 03/29/20  4:39 PM   Specimen: BLOOD  Result Value Ref Range Status   Specimen Description BLOOD SITE NOT SPECIFIED  Final   Special Requests   Final    BOTTLES DRAWN AEROBIC AND ANAEROBIC Blood Culture adequate volume   Culture   Final    NO GROWTH 5 DAYS Performed at [facility], [place] [address] [place],  [address]    Report Status 04/03/2020 FINAL  Final  Respiratory Panel by RT PCR (Flu A&B, Covid) - Nasopharyngeal Swab     Status: None   Collection Time: 03/29/20  6:30 PM   Specimen: Nasopharyngeal Swab  Result Value Ref Range Status   SARS Coronavirus 2 by RT PCR NEGATIVE NEGATIVE Final    Comment: (NOTE) SARS-CoV-2 target nucleic acids are NOT DETECTED.  The SARS-CoV-2 RNA is generally detectable in upper respiratoy specimens during the acute phase of infection. The lowest concentration of SARS-CoV-2 viral copies this assay can detect is 131 copies/mL. A negative result does not preclude SARS-Cov-2 infection and should not be used as the sole basis for  treatment or other patient management decisions. A negative result may occur with  improper specimen collection/handling, submission of specimen other than nasopharyngeal swab, presence of viral mutation(s) within the areas targeted by this assay, and inadequate number of viral copies (<131 copies/mL). A negative result must be combined with clinical observations, patient history, and epidemiological information. The expected result is Negative.  Fact Sheet for Patients:   PinkCheek.be  Fact Sheet for Healthcare Providers:  GravelBags.it  This test is no t yet approved or cleared by the [place] FDA and  has been authorized for detection and/or diagnosis of SARS-CoV-2 by FDA under an Emergency Use Authorization (EUA). This EUA will remain  in effect (meaning this test can be used) for the duration of the COVID-19 declaration under Section 564(b)(1) of the Act, 21 U.S.C. section 360bbb-3(b)(1), unless the authorization is terminated or revoked sooner.     Influenza A by PCR NEGATIVE NEGATIVE Final   Influenza B by PCR NEGATIVE NEGATIVE Final    Comment: (NOTE) The Xpert Xpress SARS-CoV-2/FLU/RSV assay is intended as an aid in  the diagnosis of influenza from Nasopharyngeal swab specimens and  should not be used as a sole basis for treatment. Nasal washings and  aspirates are unacceptable for Xpert Xpress SARS-CoV-2/FLU/RSV  testing.  Fact Sheet for Patients: PinkCheek.be  Fact Sheet for Healthcare Providers: GravelBags.it  This test is not yet approved or cleared by the [place] FDA and  has been authorized for detection and/or diagnosis of SARS-CoV-2 by  FDA under an Emergency Use Authorization (EUA). This EUA will remain  in effect (meaning this test can be used) for the duration of the  Covid-19 declaration under Section 564(b)(1) of the Act, 21  U.S.C. section 360bbb-3(b)(1), unless the authorization is  terminated or revoked. Performed at [facility], [place] [address] [place], [place] [address]   Urine culture     Status: Abnormal   Collection Time: 03/29/20  7:29 PM   Specimen: In/Out Cath Urine  Result Value Ref Range Status   Specimen Description IN/OUT CATH URINE  Final   Special Requests   Final    NONE Performed at [facility], [place] [address], [place], [place] [address]    Culture 4,000  COLONIES/mL ESCHERICHIA COLI (A)  Final   Report Status 03/31/2020 FINAL  Final   Organism ID, Bacteria ESCHERICHIA COLI (A)  Final      Susceptibility   Escherichia coli - MIC*    AMPICILLIN >=32 RESISTANT Resistant     CEFAZOLIN <=4 SENSITIVE Sensitive     CEFTRIAXONE <=0.25 SENSITIVE Sensitive     CIPROFLOXACIN <=0.25 SENSITIVE Sensitive     GENTAMICIN >=16 RESISTANT Resistant     IMIPENEM <=0.25 SENSITIVE Sensitive     NITROFURANTOIN <=16 SENSITIVE Sensitive     TRIMETH/SULFA >=320 RESISTANT Resistant     AMPICILLIN/SULBACTAM 16 INTERMEDIATE Intermediate     PIP/TAZO <=4 SENSITIVE Sensitive     * 4,000 COLONIES/mL ESCHERICHIA COLI  Blood culture (routine single)     Status: None   Collection Time: 03/30/20  4:56 AM   Specimen: BLOOD LEFT HAND  Result Value Ref Range Status   Specimen Description BLOOD LEFT HAND  Final   Special Requests   Final    BOTTLES DRAWN AEROBIC AND ANAEROBIC Blood Culture adequate volume   Culture   Final    NO GROWTH 5 DAYS Performed at [facility], [address] [place], [place] [address]  Report Status 04/04/2020 FINAL  Final     Labs:   Basic Metabolic Panel: Recent Labs  Lab 04/01/20 0335 04/02/20 0548 04/03/20 0310 04/04/20 0614 04/05/20 0156  NA 150* 145 145 147* 148*  K 4.1 4.4 5.4* 3.6 4.0  CL 119* 114* 115* 112* 115*  CO2 23 22 19* 25 24  GLUCOSE 162* 159* 96 124* 125*  BUN 32* 25* 23 22 19   CREATININE 1.25* 1.04* 0.96 1.02* 0.93  CALCIUM 8.7* 8.5* 8.8* 9.1 9.2   Liver Function Tests: Recent Labs  Lab 03/31/20 0231 04/01/20 0335 04/02/20 0548 04/03/20 0310 04/04/20 0614  AST 70* 63* 54* 65* 56*  ALT 65* 57* 53* 49* 55*  ALKPHOS 53 50 59 63 58  BILITOT 1.1 0.9 1.0 1.8* 0.9  PROT 6.5 6.1* 5.8* 5.8* 6.0*  ALBUMIN 3.0* 2.5* 2.4* 2.6* 2.6*   CBC: Recent Labs  Lab 03/29/20 1507 03/30/20 0456 03/31/20 0231 04/01/20 0335 04/02/20 0548 04/03/20 0310 04/04/20 0614  WBC 15.1*   < > 17.0* 12.3* 7.4 8.0  6.5  NEUTROABS 13.2*  --   --   --   --   --   --   HGB 14.7   < > 13.9 12.1 11.8* 13.1 12.2  HCT 49.3*   < > 46.2* 39.6 37.7 42.0 38.9  MCV 104.4*   < > 106.5* 101.5* 103.3* 102.7* 101.6*  PLT 187   < > 106* 129* 169 143* 164   < > = values in this interval not displayed.    CBG: Recent Labs  Lab 04/03/20 2126 04/04/20 0837 04/04/20 1154 04/04/20 1643 04/05/20 0657  GLUCAP 161* 113* 176* 96 117*     IMAGING STUDIES CT HEAD WO CONTRAST  Result Date: 03/29/2020 CLINICAL DATA:  Mental status change. EXAM: CT HEAD WITHOUT CONTRAST TECHNIQUE: Contiguous axial images were obtained from the base of the skull through the vertex without intravenous contrast. COMPARISON:  CT head September 06, 2017 FINDINGS: Brain: No evidence of acute large vascular territory infarction, hemorrhage, hydrocephalus, extra-axial collection or mass lesion/mass effect. Similar patchy white matter hypoattenuation, compatible with chronic microvascular ischemic disease. Similar diffuse cerebral volume loss with ex vacuo ventricular dilation. Partially empty sella. Vascular: Calcific atherosclerosis. Skull: Normal. Negative for fracture or focal lesion. Sinuses/Orbits: The sinuses are clear. No evidence of acute orbital abnormality. Other: No mastoid effusions. IMPRESSION: No evidence of acute intracranial abnormality. Electronically Signed   By: Margaretha Sheffield MD   On: 03/29/2020 18:25   DG Chest Portable 1 View  Result Date: 03/29/2020 CLINICAL DATA:  Altered mental status EXAM: PORTABLE CHEST 1 VIEW COMPARISON:  09/06/2017 FINDINGS: Cardiac shadow is mildly enlarged but stable. Defibrillator is again seen and stable. The lungs are well aerated bilaterally. No focal infiltrate or effusion is seen. No acute bony abnormality is noted. IMPRESSION: No acute abnormality seen. Electronically Signed   By: Inez Catalina M.D.   On: 03/29/2020 16:00   US Abdomen Limited RUQ (LIVER/GB)  Result Date: 04/03/2020 CLINICAL DATA:   Elevated LFTs, history of prior cholecystectomy EXAM: ULTRASOUND ABDOMEN LIMITED RIGHT UPPER QUADRANT COMPARISON:  None. FINDINGS: Gallbladder: Surgically removed. Common bile duct: Diameter: Dilated to 13.3 mm consistent with the post cholecystectomy state. Liver: No focal lesion identified. Within normal limits in parenchymal echogenicity. Portal vein is patent on color Doppler imaging with normal direction of blood flow towards the liver. Other: None. IMPRESSION: Status post cholecystectomy with biliary dilatation expected due to the postsurgical state. No acute abnormality is noted. Electronically  Signed   By: Inez Catalina M.D.   On: 04/03/2020 18:01    DISCHARGE EXAMINATION: Vitals:   04/04/20 1713 04/04/20 2059 04/05/20 0500 04/05/20 0838  BP: 135/74 108/67 122/70 99/66  Pulse: 89 87 97 99  Resp: 18 17 19 18   Temp: 98.7 F (37.1 C) 98.5 F (36.9 C) 98.4 F (36.9 C) 98.4 F (36.9 C)  TempSrc:  Oral Oral Oral  SpO2: 97% 95% 95% 94%  Weight:      Height:       General appearance: Awake alert.  In no distress.  Distracted Resp: Clear to auscultation bilaterally.  Normal effort Cardio: S1-S2 is normal regular.  No S3-S4.  No rubs murmurs or bruit GI: Abdomen is soft.  Nontender nondistended.  Bowel sounds are present normal.  No masses organomegaly    DISPOSITION: SNF  Discharge Instructions    Call MD for:  difficulty breathing, headache or visual disturbances   Complete by: As directed    Call MD for:  extreme fatigue   Complete by: As directed    Call MD for:  persistant dizziness or light-headedness   Complete by: As directed    Call MD for:  persistant nausea and vomiting   Complete by: As directed    Call MD for:  severe uncontrolled pain   Complete by: As directed    Call MD for:  temperature >100.4   Complete by: As directed    Discharge instructions   Complete by: As directed    Please review instructions on the discharge summary.  You were cared for by a  hospitalist during your hospital stay. If you have any questions about your discharge medications or the care you received while you were in the hospital after you are discharged, you can call the unit and asked to speak with the hospitalist on call if the hospitalist that took care of you is not available. Once you are discharged, your primary care physician will handle any further medical issues. Please note that NO REFILLS for any discharge medications will be authorized once you are discharged, as it is imperative that you return to your primary care physician (or establish a relationship with a primary care physician if you do not have one) for your aftercare needs so that they can reassess your need for medications and monitor your lab values. If you do not have a primary care physician, you can call [phone] for a physician referral.   Increase activity slowly   Complete by: As directed         Allergies as of 04/05/2020      Reactions   Codeine    Upset stomach    Iohexol Hives    Desc: Pt. states she broke out in hives 30 yrs ago w/ a CT Brain scan.  She has since had a heart cath and this CT today (08/10/05) w/o premeds and did well. No evident reaction.   Shellfish Allergy Itching   Past history of itching with shrimp, has a history of allergy shots , no problem with seafood in years per patient (??)      Medication List    STOP taking these medications   citalopram 20 MG tablet Commonly known as: CELEXA   furosemide 20 MG tablet Commonly known as: LASIX   losartan 25 MG tablet Commonly known as: COZAAR   rOPINIRole 0.5 MG tablet Commonly known as: REQUIP     TAKE these medications   carvedilol 3.125 MG tablet  Commonly known as: COREG Take 1 tablet (3.125 mg total) by mouth 2 (two) times daily with a meal. What changed:   medication strength  how much to take  when to take this   cephALEXin 500 MG capsule Commonly known as: KEFLEX Take 1 capsule (500 mg  total) by mouth every 8 (eight) hours for 2 days.   loratadine 10 MG tablet Commonly known as: CLARITIN Take 10 mg by mouth daily.   memantine 10 MG tablet Commonly known as: NAMENDA TAKE 1 TABLET BY MOUTH TWICE A DAY   QUEtiapine 25 MG tablet Commonly known as: SEROQUEL Take 1 tablet (25 mg total) by mouth 2 (two) times daily.   vitamin B-12 500 MCG tablet Commonly known as: CYANOCOBALAMIN Take 1 tablet (500 mcg total) by mouth daily.   Vitamin D3 50 MCG (2000 UT) Tabs Take 2,000 Units by mouth daily.         Follow-up Information    Tisovec, Fransico Him, MD. Schedule an appointment as soon as possible for a visit in 1 week(s).   Specialty: Internal Medicine Contact information: [address] [place] [address] [phone]               TOTAL DISCHARGE TIME: 59 minutes  [place]  [facility] Pager on [email or web address]  04/05/2020, 10:02 AM

## 2020-04-05 NOTE — TOC Progression Note (Signed)
Transition of Care [facility]) - Progression Note    Patient Details  Name: Samantha [name] MRN: [id number] Date of Birth: 21-Dec-1940  Transition [facility] Care [facility]) CM/SW Contact  Sharlet Salina Mila Homer, LCSW Phone Number: 04/05/2020, 3:05 PM  Clinical Narrative:  Patient is from [place] with Assisted Services and short-term rehab is being recommended. Talked with son Gerald Stabs [phone]) and provided him with facility responses and requested a decision as soon as possible, as patient medically stable for discharge. Received call from son later and [facility] chosen.   Call made to [facility] and talked with admissions director Irine Seal, and they can take patient. Son contacted and updated and initiated insurance authorization through [place].      Expected Discharge Plan: [place] Barriers to Discharge: Continued Medical Work up (PT/OT ordered but has not evaluated patient yet)  Expected Discharge Plan and Services Expected Discharge Plan: [place] In-house Referral: Clinical Social Work     Living arrangements for the past 2 months: [place] ([place]) Expected Discharge Date: 04/05/20                                   Social Determinants of Health (SDOH) Interventions  No SDOH interventions requested or needed at this time   Readmission Risk Interventions No flowsheet data found.

## 2020-04-05 NOTE — Care Management Important Message (Signed)
Important Message  Patient Details  Name: Samantha [name] MRN: [id number] Date of Birth: 1940/11/23   Medicare Important Message Given:  Yes - Important Message mailed due to current National Emergency  Verbal consent obtained due to current National Emergency  Relationship to patient: Child Contact Name: Verline Lema Call Date: 04/05/20  Time: 1106 Phone: [phone] Outcome: Spoke with contact Important Message mailed to: Other (must enter comment) (daughter declined additional copy of IM)    Shmiel Morton P Jarmel Linhardt 04/05/2020, 11:08 AM

## 2020-04-06 DIAGNOSIS — I10 Essential (primary) hypertension: Secondary | ICD-10-CM

## 2020-04-06 DIAGNOSIS — R6520 Severe sepsis without septic shock: Secondary | ICD-10-CM

## 2020-04-06 DIAGNOSIS — A419 Sepsis, unspecified organism: Secondary | ICD-10-CM

## 2020-04-06 DIAGNOSIS — N179 Acute kidney failure, unspecified: Secondary | ICD-10-CM

## 2020-04-06 DIAGNOSIS — N3000 Acute cystitis without hematuria: Secondary | ICD-10-CM

## 2020-04-06 LAB — GLUCOSE, CAPILLARY
Glucose-Capillary: 140 mg/dL — ABNORMAL HIGH (ref 70–99)
Glucose-Capillary: 116 mg/dL — ABNORMAL HIGH (ref 70–99)
Glucose-Capillary: 153 mg/dL — ABNORMAL HIGH (ref 70–99)

## 2020-04-06 LAB — RESPIRATORY PANEL BY RT PCR (FLU A&B, COVID)
Influenza A by PCR: NEGATIVE
Influenza B by PCR: NEGATIVE
SARS Coronavirus 2 by RT PCR: NEGATIVE

## 2020-04-06 NOTE — Progress Notes (Signed)
OT Cancellation Note  Patient Details Name: Samantha [name] MRN: [id number] DOB: 1940-11-26   Cancelled Treatment:    Reason Eval/Treat Not Completed: Fatigue/lethargy limiting ability to participate. Patient minimally responsive, keeping eyes shut tightly and minimally opening when prompted. Will re-attempt as schedule permits.  Delbert Phenix OT OT pager: [phone]   Rosemary Holms 04/06/2020, 8:58 AM

## 2020-04-06 NOTE — TOC Transition Note (Signed)
Transition of Care [facility]) - CM/SW Discharge Note *Discharged to [facility]  *Room 704-P   Patient Details  Name: Samantha [name] MRN: [id number] Date of Birth: [birth year]  [facility] Care [facility]) CM/SW Contact:  Samantha Feil, Samantha [name] Phone Number: 04/06/2020, 3:26 PM   Clinical Narrative:  Patient medically stable for discharge and going to [facility] for [place] rehab. Samantha, Samantha [name] contacted [phone]) and informed of patient's readiness for discharge. Admissions director Samantha [name] contacted and informed of discharge and d/c clinicals transmitted to facility. Samantha [name] will be transported to facility by non-emergency ambulance transport.\   Final next level of care: Skilled Nursing Facility Barriers to Discharge: Barriers Resolved   Patient Goals and CMS Choice Patient states their goals for this hospitalization and ongoing recovery are:: Samantha agreeale to [place] rehab for patient CMS Medicare.gov Compare Post Acute Care list provided to:: Patient Represenative (must comment) Choice offered to / list presented to : Adult Children (Samantha Samantha [name])  Discharge Placement   Existing PASRR number confirmed : 04/03/20          Patient chooses bed at: [facility] Patient to be transferred to facility by: Non-emergency ambulance transport Name of family member notified: Samantha [name] - [phone] Patient and family notified of of transfer: 04/06/20  Discharge Plan and Services In-house Referral: Clinical Social Work                                 Social Determinants of Health (SDOH) Interventions  No SDOH interventions requested or needed at discharge   Readmission Risk Interventions No flowsheet data found.

## 2020-04-06 NOTE — Plan of Care (Signed)
  Problem: Safety: Goal: Ability to remain free from injury will improve Outcome: Progressing   

## 2020-04-06 NOTE — Progress Notes (Signed)
Report given to [place] at [facility]. All questions/concerns answered.

## 2020-04-06 NOTE — Progress Notes (Signed)
Pt stated she does not wear a CPAP at night and didn't want to wear one tonight.

## 2020-04-06 NOTE — Progress Notes (Signed)
Patient ID: Samantha [name], female   DOB: December 14, 1940, 79 y.o.   MRN: [id number] Patient was supposed to be discharged to SNF on 04/05/2020 but is pending insurance authorization.  Patient seen and examined at bedside.  She is currently medically stable for discharge.  Please refer to the full discharge summary done by Dr. [place] Pink on 04/05/2020 for full details.

## 2020-04-11 LAB — CUP PACEART REMOTE DEVICE CHECK
Battery Remaining Longevity: 120 mo
Battery Remaining Percentage: 100 %
Brady Statistic RA Percent Paced: 0 %
Brady Statistic RV Percent Paced: 0 %
Date Time Interrogation Session: 20211023144000
HighPow Impedance: 102 Ohm
Implantable Lead Implant Date: 20171229
Implantable Lead Implant Date: 20171229
Implantable Lead Implant Date: 20171229
Implantable Lead Location: 753858
Implantable Lead Location: 753859
Implantable Lead Location: 753860
Implantable Lead Model: 292
Implantable Lead Model: 4677
Implantable Lead Model: 7740
Implantable Lead Serial Number: 423617
Implantable Lead Serial Number: 604122
Implantable Lead Serial Number: 686437
Implantable Pulse Generator Implant Date: 20171229
Lead Channel Impedance Value: 530 Ohm
Lead Channel Impedance Value: 621 Ohm
Lead Channel Impedance Value: 758 Ohm
Lead Channel Setting Pacing Amplitude: 2 V
Lead Channel Setting Pacing Amplitude: 2.5 V
Lead Channel Setting Pacing Pulse Width: 0.4 ms
Lead Channel Setting Sensing Sensitivity: 0.5 mV
Lead Channel Setting Sensing Sensitivity: 1 mV
Pulse Gen Serial Number: 169900

## 2020-04-21 NOTE — Progress Notes (Signed)
Remote ICD transmission.   

## 2020-05-04 ENCOUNTER — Telehealth: Payer: Self-pay

## 2020-05-04 NOTE — Telephone Encounter (Signed)
Latitude alert received 05/03/20 for thoracic impedance trending downward~ 46.9 ohms. Also, 53 VHR events, 13 seconds to 8 hours in duration, rates to 170's. Patient was admitted with Sepsis on 03/29/20, D/C on 04/06/20. Spoke to [place] ([facility]), states she is the daughter of the patient. States patient has declined a lot within the past month and memory has declined as well. Reports dementia. Patient was in [place] and has since moved to another facility on a memory hall. Abigail Butts reports she was with the patient during events on 05/01/20, states patient was up walking and Abigail Butts noticed patient was short of breath. States it did resolve after walking was complete. States she did not notice any swelling in her legs or any other signs that patient was retaining fluid. Advised I will forward to Dr. Lovena Le for review.

## 2020-05-06 NOTE — Telephone Encounter (Signed)
If bp allows, I would increase coreg to 6.25 bid.

## 2020-05-10 ENCOUNTER — Telehealth: Payer: Self-pay

## 2020-05-10 MED ORDER — CARVEDILOL 6.25 MG PO TABS: 6.2500 mg | ORAL_TABLET | Freq: Two times a day (BID) | ORAL | 3 refills | Status: DC

## 2020-05-10 NOTE — Telephone Encounter (Signed)
Spoke to patients daughter [name] ([place]), states patient lives in at [place] [phone].   Called facility, spoke to [place] (patients nurse). Patients blood pressure 136/75 (checked weekly). Prescription updated and faxed to facility at [phone].

## 2020-06-06 ENCOUNTER — Telehealth: Payer: Self-pay | Admitting: Emergency Medicine

## 2020-06-06 MED ORDER — CARVEDILOL 6.25 MG PO TABS
6.2500 mg | ORAL_TABLET | Freq: Two times a day (BID) | ORAL | 3 refills | Status: DC
Start: 2020-06-06 — End: 2020-06-06

## 2020-06-06 MED ORDER — CARVEDILOL 6.25 MG PO TABS: 6.2500 mg | ORAL_TABLET | Freq: Two times a day (BID) | ORAL | 3 refills | Status: AC

## 2020-06-06 MED ORDER — CARVEDILOL 6.25 MG PO TABS: 6.2500 mg | ORAL_TABLET | Freq: Two times a day (BID) | ORAL | 3 refills | Status: DC

## 2020-06-06 NOTE — Telephone Encounter (Signed)
[place] RN reports NP just assessed patient and she has no chest tightness, CP, SOB, or pedal edema. NP reports no s/sx of CHF. Patient medication list showed Coreg was not increased to 6.25 mg BID as ordered by Dr Lovena Le on 05/10/20. [place] RN requested faxed order to make medication change. [place] fax # [phone]. Coreg RX faxed to attention [facility] LPN.

## 2020-06-06 NOTE — Telephone Encounter (Signed)
Heartlogic score 52, thoracic impedance decreased. Janelle at [facility] facility reports patient had no change in condition but has been relocated to [facility] , contact # [phone]. Spoke with Gabriel Cirri the med tech at [facility] and she will have the nurse call back . Informed that need to assess patient for CHF.

## 2020-06-12 NOTE — Telephone Encounter (Signed)
Contact if short of breath or weight going up. Otherwise no change.

## 2020-06-13 ENCOUNTER — Telehealth: Payer: Self-pay

## 2020-06-13 NOTE — Telephone Encounter (Signed)
Spoke to patients daughter Bjorn Loser [facility]), advised Heart Logic was 64 which indicates increased fluid volume.  Patient is currently admitted in [facility] per [place].    Ardis Hughs I could not release any information to anyone who was not on the DPR. Voice understanding. Bjorn Loser states she would pass this information onto [place]. Thanked me for the phone call.

## 2020-06-13 NOTE — Telephone Encounter (Signed)
Patient daughter in law called stating she was sending a transmission with the patient monitor.  She said the patient is in the hospital for HF. Transmission received. She would like for someone to call her with the results. Her name is Stanton Kidney and her phone number is [phone]. However, I did not see Debra name on the dpr.

## 2020-06-24 ENCOUNTER — Other Ambulatory Visit: Payer: Self-pay | Admitting: Internal Medicine

## 2020-06-24 DIAGNOSIS — I5023 Acute on chronic systolic (congestive) heart failure: Secondary | ICD-10-CM

## 2020-07-06 ENCOUNTER — Telehealth: Payer: Self-pay

## 2020-07-06 NOTE — Telephone Encounter (Signed)
Patient has passed away I have canceled all remotes and released her in BSX

## 2020-07-19 DEATH — deceased

## 2020-07-25 ENCOUNTER — Telehealth: Payer: Self-pay | Admitting: Adult Health

## 2020-07-25 NOTE — Telephone Encounter (Signed)
FYI: Pt's daughter, Verline Lema called, mother passed away 07-09-22. You cancel that appt.

## 2020-07-25 NOTE — Telephone Encounter (Signed)
Sympathy card

## 2020-07-26 ENCOUNTER — Ambulatory Visit: Payer: PRIVATE HEALTH INSURANCE | Admitting: Adult Health
